# Patient Record
Sex: Female | Born: 1935
Health system: Southern US, Community
[De-identification: ages and names within clinical notes are randomized; demographics above are authoritative.]

## PROBLEM LIST (undated history)

## (undated) DIAGNOSIS — F32A Depression, unspecified: Secondary | ICD-10-CM

## (undated) DIAGNOSIS — Z6834 Body mass index (BMI) 34.0-34.9, adult: Secondary | ICD-10-CM

## (undated) DIAGNOSIS — R0902 Hypoxemia: Secondary | ICD-10-CM

## (undated) DIAGNOSIS — Z9889 Other specified postprocedural states: Secondary | ICD-10-CM

## (undated) DIAGNOSIS — R296 Repeated falls: Secondary | ICD-10-CM

## (undated) DIAGNOSIS — F329 Major depressive disorder, single episode, unspecified: Secondary | ICD-10-CM

## (undated) DIAGNOSIS — J9602 Acute respiratory failure with hypercapnia: Secondary | ICD-10-CM

## (undated) DIAGNOSIS — G479 Sleep disorder, unspecified: Secondary | ICD-10-CM

## (undated) DIAGNOSIS — J9610 Chronic respiratory failure, unspecified whether with hypoxia or hypercapnia: Secondary | ICD-10-CM

## (undated) DIAGNOSIS — R112 Nausea with vomiting, unspecified: Secondary | ICD-10-CM

## (undated) DIAGNOSIS — G4733 Obstructive sleep apnea (adult) (pediatric): Secondary | ICD-10-CM

## (undated) DIAGNOSIS — M1990 Unspecified osteoarthritis, unspecified site: Secondary | ICD-10-CM

## (undated) DIAGNOSIS — R000 Tachycardia, unspecified: Secondary | ICD-10-CM

## (undated) DIAGNOSIS — D494 Neoplasm of unspecified behavior of bladder: Secondary | ICD-10-CM

## (undated) DIAGNOSIS — J3089 Other allergic rhinitis: Secondary | ICD-10-CM

## (undated) DIAGNOSIS — K219 Gastro-esophageal reflux disease without esophagitis: Secondary | ICD-10-CM

## (undated) DIAGNOSIS — R053 Chronic cough: Secondary | ICD-10-CM

## (undated) DIAGNOSIS — I2510 Atherosclerotic heart disease of native coronary artery without angina pectoris: Secondary | ICD-10-CM

## (undated) DIAGNOSIS — M797 Fibromyalgia: Secondary | ICD-10-CM

## (undated) DIAGNOSIS — J189 Pneumonia, unspecified organism: Secondary | ICD-10-CM

## (undated) DIAGNOSIS — R0602 Shortness of breath: Secondary | ICD-10-CM

## (undated) DIAGNOSIS — M4800 Spinal stenosis, site unspecified: Secondary | ICD-10-CM

## (undated) DIAGNOSIS — J9621 Acute and chronic respiratory failure with hypoxia: Secondary | ICD-10-CM

## (undated) DIAGNOSIS — D509 Iron deficiency anemia, unspecified: Secondary | ICD-10-CM

## (undated) DIAGNOSIS — Z9981 Dependence on supplemental oxygen: Secondary | ICD-10-CM

## (undated) DIAGNOSIS — R058 Other specified cough: Secondary | ICD-10-CM

## (undated) DIAGNOSIS — E785 Hyperlipidemia, unspecified: Secondary | ICD-10-CM

## (undated) DIAGNOSIS — R7989 Other specified abnormal findings of blood chemistry: Secondary | ICD-10-CM

## (undated) DIAGNOSIS — R319 Hematuria, unspecified: Secondary | ICD-10-CM

## (undated) DIAGNOSIS — R102 Pelvic and perineal pain: Secondary | ICD-10-CM

## (undated) DIAGNOSIS — R05 Cough: Secondary | ICD-10-CM

## (undated) DIAGNOSIS — H9191 Unspecified hearing loss, right ear: Secondary | ICD-10-CM

## (undated) DIAGNOSIS — D72819 Decreased white blood cell count, unspecified: Secondary | ICD-10-CM

## (undated) DIAGNOSIS — R42 Dizziness and giddiness: Secondary | ICD-10-CM

## (undated) DIAGNOSIS — M8580 Other specified disorders of bone density and structure, unspecified site: Secondary | ICD-10-CM

## (undated) DIAGNOSIS — Z87448 Personal history of other diseases of urinary system: Secondary | ICD-10-CM

## (undated) DIAGNOSIS — J449 Chronic obstructive pulmonary disease, unspecified: Secondary | ICD-10-CM

## (undated) DIAGNOSIS — R7303 Prediabetes: Secondary | ICD-10-CM

## (undated) DIAGNOSIS — R32 Unspecified urinary incontinence: Secondary | ICD-10-CM

## (undated) DIAGNOSIS — G609 Hereditary and idiopathic neuropathy, unspecified: Secondary | ICD-10-CM

## (undated) DIAGNOSIS — M2550 Pain in unspecified joint: Secondary | ICD-10-CM

## (undated) HISTORY — DX: Decreased white blood cell count, unspecified: D72.819

## (undated) HISTORY — DX: Other allergic rhinitis: J30.89

## (undated) HISTORY — DX: Unspecified osteoarthritis, unspecified site: M19.90

## (undated) HISTORY — DX: Tachycardia, unspecified: R00.0

## (undated) HISTORY — DX: Depression, unspecified: F32.A

## (undated) HISTORY — DX: Fibromyalgia: M79.7

## (undated) HISTORY — PX: CATARACT EXTRACTION W/ INTRAOCULAR LENS  IMPLANT, BILATERAL: SHX1307

## (undated) HISTORY — PX: ESOPHAGOGASTRODUODENOSCOPY: SHX1529

## (undated) HISTORY — DX: Body mass index (BMI) 34.0-34.9, adult: Z68.34

## (undated) HISTORY — DX: Hyperlipidemia, unspecified: E78.5

## (undated) HISTORY — DX: Prediabetes: R73.03

## (undated) HISTORY — DX: Spinal stenosis, site unspecified: M48.00

## (undated) HISTORY — DX: Acute and chronic respiratory failure with hypoxia: J96.21

## (undated) HISTORY — DX: Iron deficiency anemia, unspecified: D50.9

## (undated) HISTORY — DX: Other specified disorders of bone density and structure, unspecified site: M85.80

## (undated) HISTORY — DX: Pain in unspecified joint: M25.50

## (undated) HISTORY — DX: Other specified abnormal findings of blood chemistry: R79.89

## (undated) HISTORY — PX: TONSILLECTOMY: SUR1361

## (undated) HISTORY — PX: CARDIAC CATHETERIZATION: SHX172

## (undated) HISTORY — DX: Pneumonia, unspecified organism: J18.9

## (undated) HISTORY — DX: Repeated falls: R29.6

## (undated) HISTORY — DX: Hypoxemia: R09.02

## (undated) HISTORY — DX: Unspecified hearing loss, right ear: H91.91

## (undated) HISTORY — DX: Dizziness and giddiness: R42

## (undated) HISTORY — DX: Hypomagnesemia: E83.42

## (undated) HISTORY — DX: Shortness of breath: R06.02

## (undated) HISTORY — DX: Acute respiratory failure with hypercapnia: J96.02

## (undated) HISTORY — DX: Sleep disorder, unspecified: G47.9

---
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
## 1969-04-23 HISTORY — PX: APPENDECTOMY: SHX54

## 1971-04-24 HISTORY — PX: HEMORRHOID SURGERY: SHX153

## 1971-04-24 HISTORY — PX: TOTAL ABDOMINAL HYSTERECTOMY W/ BILATERAL SALPINGOOPHORECTOMY: SHX83

## 1986-04-23 HISTORY — PX: CHOLECYSTECTOMY: SHX55

## 2000-02-12 ENCOUNTER — Other Ambulatory Visit: Admission: RE | Admit: 2000-02-12 | Discharge: 2000-02-12 | Payer: Self-pay | Admitting: *Deleted

## 2000-03-28 ENCOUNTER — Encounter (INDEPENDENT_AMBULATORY_CARE_PROVIDER_SITE_OTHER): Payer: Self-pay | Admitting: Specialist

## 2000-03-28 ENCOUNTER — Ambulatory Visit (HOSPITAL_COMMUNITY): Admission: RE | Admit: 2000-03-28 | Discharge: 2000-03-28 | Payer: Self-pay | Admitting: *Deleted

## 2000-06-26 ENCOUNTER — Ambulatory Visit (HOSPITAL_COMMUNITY): Admission: RE | Admit: 2000-06-26 | Discharge: 2000-06-26 | Payer: Self-pay | Admitting: Orthopedic Surgery

## 2000-06-26 ENCOUNTER — Encounter: Payer: Self-pay | Admitting: Orthopedic Surgery

## 2000-07-31 ENCOUNTER — Encounter: Payer: Self-pay | Admitting: Orthopedic Surgery

## 2000-07-31 ENCOUNTER — Encounter: Admission: RE | Admit: 2000-07-31 | Discharge: 2000-07-31 | Payer: Self-pay | Admitting: Orthopedic Surgery

## 2000-08-14 ENCOUNTER — Encounter: Admission: RE | Admit: 2000-08-14 | Discharge: 2000-08-14 | Payer: Self-pay | Admitting: Orthopedic Surgery

## 2000-08-14 ENCOUNTER — Encounter: Payer: Self-pay | Admitting: Orthopedic Surgery

## 2000-08-26 ENCOUNTER — Encounter: Admission: RE | Admit: 2000-08-26 | Discharge: 2000-08-26 | Payer: Self-pay | Admitting: Orthopedic Surgery

## 2000-08-26 ENCOUNTER — Encounter: Payer: Self-pay | Admitting: Orthopedic Surgery

## 2001-05-05 ENCOUNTER — Ambulatory Visit (HOSPITAL_COMMUNITY): Admission: RE | Admit: 2001-05-05 | Discharge: 2001-05-05 | Payer: Self-pay | Admitting: *Deleted

## 2002-04-30 ENCOUNTER — Encounter: Payer: Self-pay | Admitting: Orthopedic Surgery

## 2002-04-30 ENCOUNTER — Encounter: Admission: RE | Admit: 2002-04-30 | Discharge: 2002-04-30 | Payer: Self-pay | Admitting: Orthopedic Surgery

## 2002-05-20 ENCOUNTER — Encounter: Admission: RE | Admit: 2002-05-20 | Discharge: 2002-05-20 | Payer: Self-pay | Admitting: Orthopedic Surgery

## 2002-05-20 ENCOUNTER — Encounter: Payer: Self-pay | Admitting: Orthopedic Surgery

## 2002-06-10 ENCOUNTER — Encounter: Payer: Self-pay | Admitting: Orthopedic Surgery

## 2002-06-10 ENCOUNTER — Encounter: Admission: RE | Admit: 2002-06-10 | Discharge: 2002-06-10 | Payer: Self-pay | Admitting: Orthopedic Surgery

## 2003-05-07 ENCOUNTER — Ambulatory Visit (HOSPITAL_COMMUNITY): Admission: RE | Admit: 2003-05-07 | Discharge: 2003-05-07 | Payer: Self-pay | Admitting: *Deleted

## 2003-05-07 ENCOUNTER — Encounter (INDEPENDENT_AMBULATORY_CARE_PROVIDER_SITE_OTHER): Payer: Self-pay | Admitting: *Deleted

## 2005-03-12 ENCOUNTER — Encounter: Admission: RE | Admit: 2005-03-12 | Discharge: 2005-03-12 | Payer: Self-pay | Admitting: Family Medicine

## 2006-04-27 ENCOUNTER — Encounter: Payer: Self-pay | Admitting: Internal Medicine

## 2006-04-27 ENCOUNTER — Inpatient Hospital Stay (HOSPITAL_COMMUNITY): Admission: EM | Admit: 2006-04-27 | Discharge: 2006-05-01 | Payer: Self-pay | Admitting: Emergency Medicine

## 2006-05-13 ENCOUNTER — Encounter: Admission: RE | Admit: 2006-05-13 | Discharge: 2006-05-13 | Payer: Self-pay | Admitting: Family Medicine

## 2006-05-20 ENCOUNTER — Ambulatory Visit (HOSPITAL_COMMUNITY): Admission: RE | Admit: 2006-05-20 | Discharge: 2006-05-20 | Payer: Self-pay | Admitting: Family Medicine

## 2007-03-13 ENCOUNTER — Ambulatory Visit (HOSPITAL_COMMUNITY): Admission: RE | Admit: 2007-03-13 | Discharge: 2007-03-13 | Payer: Self-pay | Admitting: Family Medicine

## 2007-03-28 ENCOUNTER — Inpatient Hospital Stay (HOSPITAL_COMMUNITY): Admission: EM | Admit: 2007-03-28 | Discharge: 2007-04-01 | Payer: Self-pay | Admitting: Emergency Medicine

## 2007-04-28 ENCOUNTER — Encounter: Payer: Self-pay | Admitting: Internal Medicine

## 2007-05-08 ENCOUNTER — Inpatient Hospital Stay (HOSPITAL_BASED_OUTPATIENT_CLINIC_OR_DEPARTMENT_OTHER): Admission: RE | Admit: 2007-05-08 | Discharge: 2007-05-08 | Payer: Self-pay | Admitting: Cardiology

## 2007-05-15 ENCOUNTER — Ambulatory Visit: Payer: Self-pay | Admitting: Internal Medicine

## 2007-05-15 ENCOUNTER — Encounter: Admission: RE | Admit: 2007-05-15 | Discharge: 2007-05-15 | Payer: Self-pay | Admitting: Family Medicine

## 2007-05-15 DIAGNOSIS — K219 Gastro-esophageal reflux disease without esophagitis: Secondary | ICD-10-CM | POA: Insufficient documentation

## 2007-05-15 DIAGNOSIS — E785 Hyperlipidemia, unspecified: Secondary | ICD-10-CM

## 2007-05-15 HISTORY — DX: Gastro-esophageal reflux disease without esophagitis: K21.9

## 2007-05-15 HISTORY — DX: Hyperlipidemia, unspecified: E78.5

## 2007-06-19 ENCOUNTER — Ambulatory Visit: Payer: Self-pay | Admitting: Internal Medicine

## 2007-06-19 DIAGNOSIS — J449 Chronic obstructive pulmonary disease, unspecified: Secondary | ICD-10-CM | POA: Insufficient documentation

## 2007-06-19 HISTORY — DX: Chronic obstructive pulmonary disease, unspecified: J44.9

## 2007-10-06 ENCOUNTER — Ambulatory Visit (HOSPITAL_COMMUNITY): Admission: RE | Admit: 2007-10-06 | Discharge: 2007-10-06 | Payer: Self-pay | Admitting: Neurosurgery

## 2007-10-07 ENCOUNTER — Ambulatory Visit: Payer: Self-pay | Admitting: Critical Care Medicine

## 2007-10-07 ENCOUNTER — Inpatient Hospital Stay (HOSPITAL_COMMUNITY): Admission: RE | Admit: 2007-10-07 | Discharge: 2007-10-13 | Payer: Self-pay | Admitting: Neurosurgery

## 2007-10-07 HISTORY — PX: POSTERIOR LUMBAR FUSION: SHX6036

## 2007-10-10 ENCOUNTER — Ambulatory Visit: Payer: Self-pay | Admitting: Physical Medicine & Rehabilitation

## 2007-10-13 ENCOUNTER — Ambulatory Visit: Payer: Self-pay | Admitting: Emergency Medicine

## 2007-10-13 ENCOUNTER — Ambulatory Visit: Payer: Self-pay | Admitting: Physical Medicine & Rehabilitation

## 2007-10-13 ENCOUNTER — Inpatient Hospital Stay (HOSPITAL_COMMUNITY)
Admission: RE | Admit: 2007-10-13 | Discharge: 2007-10-20 | Payer: Self-pay | Admitting: Physical Medicine & Rehabilitation

## 2008-02-25 ENCOUNTER — Encounter: Admission: RE | Admit: 2008-02-25 | Discharge: 2008-02-25 | Payer: Self-pay | Admitting: Family Medicine

## 2008-05-24 ENCOUNTER — Encounter: Admission: RE | Admit: 2008-05-24 | Discharge: 2008-05-24 | Payer: Self-pay | Admitting: Family Medicine

## 2008-08-05 ENCOUNTER — Encounter: Admission: RE | Admit: 2008-08-05 | Discharge: 2008-08-05 | Payer: Self-pay | Admitting: Gastroenterology

## 2008-09-09 ENCOUNTER — Ambulatory Visit (HOSPITAL_COMMUNITY): Admission: RE | Admit: 2008-09-09 | Discharge: 2008-09-09 | Payer: Self-pay | Admitting: Gastroenterology

## 2009-05-26 ENCOUNTER — Encounter: Admission: RE | Admit: 2009-05-26 | Discharge: 2009-05-26 | Payer: Self-pay | Admitting: Family Medicine

## 2010-05-15 ENCOUNTER — Encounter: Payer: Self-pay | Admitting: Neurosurgery

## 2010-06-12 ENCOUNTER — Other Ambulatory Visit: Payer: Self-pay | Admitting: Family Medicine

## 2010-06-12 DIAGNOSIS — Z1231 Encounter for screening mammogram for malignant neoplasm of breast: Secondary | ICD-10-CM

## 2010-06-20 ENCOUNTER — Ambulatory Visit
Admission: RE | Admit: 2010-06-20 | Discharge: 2010-06-20 | Disposition: A | Discharge status: Home / Self Care | Payer: Medicare Other | Source: Ambulatory Visit | Attending: Family Medicine | Admitting: Family Medicine

## 2010-06-20 DIAGNOSIS — Z1231 Encounter for screening mammogram for malignant neoplasm of breast: Secondary | ICD-10-CM

## 2010-09-05 NOTE — Cardiovascular Report (Signed)
[name], [name]              ACCOUNT NO.:  [id number]   MEDICAL RECORD NO.:  [id number]          PATIENT TYPE:  OIB   LOCATION:  1961                         FACILITY:  [facility]   PHYSICIAN:  Jake Bathe, MD      DATE OF BIRTH:  1935-09-01   DATE OF PROCEDURE:  DATE OF DISCHARGE:  05/08/2007                            CARDIAC CATHETERIZATION   PRIMARY CARE PHYSICIAN:  Juluis Rainier, M.D.   INDICATIONS:  A 75 year old female with hyperlipidemia, tobacco abuse,  COPD with chest pain concerning for possible anginal and abnormal  nuclear stress test concerning for ischemia in the anteroseptal/possible  inferior region.  Echocardiogram revealed normal ejection fraction of  70% to 75% with diastolic dysfunction and mild aortic sclerosis.   PROCEDURE:  1. Left heart catheterization.  2. Selective coronary angiography.  3. Left ventriculogram.   PROCEDURE IN DETAIL:  Informed consent was obtained.  Risks of stroke,  heart attack, and death were explained to the patient.  She was placed  on the catheterization table and prepped in a sterile fashion.  A 1%  lidocaine was used for local anesthesia in the right groin.  Fluoroscopy  was used to map the femoral region.  Using the modified Seldinger  technique, a 4-French sheath was placed in the right femoral artery.  A  Judkins left #4 catheter was used to selectively cannulate the left main  artery.  Multiple views with hand injection of Omnipaque were obtained.  This catheter was exchanged for a No-Torque Williams right catheter,  which was used to selectively cannulate the right coronary artery.  Multiple views with hand injection were obtained.  This catheter was  exchanged for an angled pigtail, which was used to cross the aortic  valve.  Hemodynamics within the left ventricle were obtained.  Left  ventriculogram using a power injection at a rate of 15 mL per second for  a total of 30 mL was used.  Following this, a aortic valve  pullback  gradient was obtained.  Following the procedure, manual compression was  held with sheath pull.  The patient tolerated the procedure well.  Hemodynamically stable.  No hematomas.  Findings were discussed with the  patient and the family.   FINDINGS:  1. Left main artery - No angiographically significant coronary artery      disease.  2. Left anterior descending artery - This branches from left main.      She has 1 large diagonal branch and 1 small distal diagonal branch.      There is 20% mid LAD stenosis distal to the first diagonal branch.      Otherwise, no significant coronary artery disease.  3. Left circumflex artery - This is a large caliber vessel with 1      large obtuse marginal branch.  There is no angiographically      significant coronary artery disease.  4. Right coronary artery - This is the dominant vessel.  There is 10%      mid RCA stenosis with otherwise no angiographically significant      coronary artery disease.  5. Left  ventriculogram - Ejection fraction estimated at 60% to 65%      with no regional wall motion abnormalities.  There is no      significant mitral regurgitation.  There is mild amount of aortic      arch calcification.  6. Hemodynamics - Left ventricle 108/10 mmHg with 10 mmHg of being the      LVEDP or left ventricular end-diastolic pressure.  Aortic pressures      were 108/61 with a mean of 83 mmHg.  There was no gradient between      the left ventricle and the aortic root.   IMPRESSIONS:  1. Minor coronary plaquing with no angiographically significant      coronary artery disease.  2. Normal left ventricular ejection fraction estimated at 60% to 65%      with no regional wall motion abnormalities.   RECOMMENDATIONS:  Continue smoking cessation.  Continue Lipitor for  hyperlipidemia.  Continue aspirin for prevention.  Spiriva for COPD.  Possible etiologies for chest discomfort include esophageal spasm and  GERD.  She has previous  stricture noted.  Further noncardiac workup may  be necessary.      Jake Bathe, MD  Electronically Signed     MCS/MEDQ  D:  05/08/2007  T:  05/08/2007  Job:  [id number]   cc:   Juluis Rainier, M.D.

## 2010-09-05 NOTE — H&P (Signed)
[name], [name]              ACCOUNT NO.:  [id number]   MEDICAL RECORD NO.:  [id number]          PATIENT TYPE:  IPS   LOCATION:  4036                         FACILITY:  [facility]   PHYSICIAN:  Ranelle Oyster, M.D.DATE OF BIRTH:  [birth year]   DATE OF ADMISSION:  10/13/2007  DATE OF DISCHARGE:                              HISTORY & PHYSICAL   CHIEF COMPLAINT:  Back pain and leg pain with weakness.   HISTORY OF PRESENT ILLNESS:  This is a 75 year old white female with  history of lumbar stenosis and neurogenic claudication due to L4-L5  spondylolisthesis.  The patient also has chronic radiculopathy.  She  elected to undergo bilateral L4 laminectomy with L4-L5 diskectomy and  fusion on June 16 by Dr. Jeral Fruit.  The patient had postoperative pain  issues as well as lethargy secondary to narcotics.  Also notable hypoxia  followed by pulmonary medicine.  Medications were adjusted.  She was  provided with nebulizer treatments and oxygen as well as flutter valve  for hypoventilation and she has gradually improved.  The patient is a  min assist for basic mobility and transfers.  She is still having  problems stating her back precautions, although her cognition is  generally clearing.   REVIEW OF SYSTEMS:  Notable for insomnia, low-back pain, wound reflux.  Breathing is at baseline.   PAST MEDICAL HISTORY:  1. Positive COPD, on O2 at bedtime at home.  2. Allergic rhinitis.  3. Increased cholesterol.  4. Cholecystectomy.  5. Hysterectomy.  6. Hemorrhoidectomy.  7. DDD.   FAMILY HISTORY:  Positive for cancer.   SOCIAL HISTORY:  The patient lives alone at [facility] in a one-level  house.  Daughter lives out of town, but can check in on the patient and  likely will be staying with her a few days upon discharge.  The patient  occasionally drinks and has not smoked for 4 years.   FUNCTIONAL STATUS:  The patient is limited with mobility secondary to  pain but was independent with her  walker prior to arrival.  Currently,  she is min assist for basic mobility, min-to-mod assist for self-care.   ALLERGIES:  None.   MEDICATIONS:  1. Lipitor 10 mg p.o. daily.  2. Diazepam 5 mg q. 6-8 h.  3. Percocet 5/325 p.r.n.  4. Zyrtec 10 mg daily.  5. Prilosec 20 mg b.i.d.  6. Spiriva q.a.m.  7. Symbicort 2 puffs b.i.d.  8. Xopenex nebulizer daily p.r.n.  9. Astelin b.i.d.   LABORATORY DATA:  Hemoglobin 13.8, white count 10.6, platelets 381,000.  Sodium 138, potassium 4.3, BUN and creatinine 9 and 0.63.   PHYSICAL EXAMINATION:  VITAL SIGNS:  Blood pressure 102/56, pulse 83,  respiratory rate 16, temperature 98.5.  The patient is saturating 98% on  2 L and at midnight is off oxygen.  GENERAL:  The patient is pleasant, alert, and oriented x3.  HEENT:  Pupils equal, round, and reactive to light.  Nose and throat  exam is notable for fair-to-good dentition.  Pink and moist mucosa.  NECK:  Supple without JVD or lymphadenopathy.  CHEST:  Some  decreased sounds at bases, but otherwise was clear.  HEART:  Regular rate and rhythm without murmur, rubs, or gallops.  EXTREMITIES:  No clubbing, cyanosis or edema.  ABDOMEN:  Soft, nontender, slightly distended in appearance.  She had  active bowel sounds.  SKIN:  Notable for a back incision which was clean and generally intact.  NEUROLOGIC:  Cranial nerve exam II-XII was within normal limits.  Reflexes are generally 2+ in the upper extremities, 2+ to 1+ lower  extremities.  Sensation appeared slightly decreased in right leg to  pinprick and light touch.  Strength was near 4+ to 5/5 with some pain  inhibition proximally in the upper extremities.  Bilateral lower  extremity strength was 3/5 proximal and 4 to 4+/5 distally.  The patient  did have some ankle dorsiflexion weakness on the right which I will  grade 3+ to 4/5.  Judgment, orientation, memory, and mood were all fair.  She did liked to choke her way through some answers but the  most part  was appropriate today.   ASSESSMENT/PLAN:  1. Functional deficit secondary to L4-L5 spondylolisthesis with      neurogenic claudication and radiculopathy status post L4      laminectomy with L4-L5 fusion on October 07, 2007.  The patient is      admitted to the inpatient rehab unit today to receive comprehensive      medical and physical care provided in a collaborative effort      between physician, nursing, and therapy team.  Physician will      provide 24-hour rehab unit coverage with medical reasons and      supervision of therapy.  A 24-hour rehab nurse will follow the      patient for constipation issues as well as bladder continence, pain      management, and skin needs as well as incorporating therapy goals      into their care.  Physical therapy will assess and treat the      patient for range of motion, strengthening, safety, and appropriate      equipment needs as well as family education.  Occupational therapy      will assess and treat for cognitive/perceptual training for      appropriate safety, equipment, and ADL needs.  Rehab case      manager/social worker will assess for psychosocial needs and      discharge planning.  The patient will see on average at least 3      hours a day 5 days a week.  Goals are modified independent.      Estimated length of stay is 7 days or less.  Prognosis is good.  2. Chronic obstructive pulmonary disease:  Continue management with      tapering off meds per pulmonary medicine.  The patient's daytime      oxygen was discontinued today.  Continue nebulizers, flutter valve,      etc. for now.  3. Pain management:  Try to limit sedating medications as possible.      Use p.r.n. Toradol in a short-term with Vicodin for breakthrough      pain.  Add a K-pad and lidocaine patches and observe for response.  4. Constipation:  Subset enema today if no bowel movement for 6 days.      The patient's appetite has been poor, however, and we  encouraged      appropriate intake.  5. Deep venous thrombosis prophylaxis with ambulation at this point.  Ranelle Oyster, M.D.  Electronically Signed     ZTS/MEDQ  D:  10/13/2007  T:  10/14/2007  Job:  [id number]

## 2010-09-05 NOTE — H&P (Signed)
[name], [name]              ACCOUNT NO.:  [id number]   MEDICAL RECORD NO.:  [id number]          PATIENT TYPE:  INP   LOCATION:  3005                         FACILITY:  [facility]   PHYSICIAN:  Hilda Lias, M.D.   DATE OF BIRTH:  July 31, 1935   DATE OF ADMISSION:  10/07/2007  DATE OF DISCHARGE:                              HISTORY & PHYSICAL   [name] is a lady who has been seen by me in my office for at least 2  years complaining of back pain radiating to both legs which had been  going on for more than 10 years associated with weakness.  The patient  in 2007 had the diagnosis of lumbar stenosis with spondylolisthesis.  The patient declined surgery.  About 18 months ago, she got an MRI under  general anesthesia which showed that she had the same problem.  Nevertheless, she came with her daughter several days ago to my office  complaining of worsening pain.  We attempted to do myelogram yesterday  because we were unable to get another MRI with general anesthesia until  August.  The myelogram showed that she had possible spondylolisthesis at  the L4-L5.  Because of that, yesterday she decided she will have surgery  because of increasing pain and weakness.   PAST MEDICAL HISTORY:  Cholecystectomy, hysterectomy, and  hemorrhoidectomy.   SOCIAL HISTORY:  She drinks socially.   FAMILY HISTORY:  Positive for high blood pressure and diabetes.   REVIEW OF SYSTEMS:  Positive for UTI, emphysema, cholesterol, increased  chest pain, bilateral  __________.   PHYSICAL EXAMINATION:  GENERAL:  The patient came in to my office with  her daughter.  She was barely walking with a short step.  She was  walking with a walker.  HEENT:  Head, ears, nose, and throat are normal.  NECK:  Normal.  LUNGS:  Clear.  HEART:  Heart sounds normal.  ABDOMEN:  Normal.  EXTREMITIES:  Normal pulse, feet.  NEURO:  Weakness noted especially in both feet 3/5 with straight leg  raising, SLR positive at 45  degrees.  She has a decrease of reflex.  Sensation:  She complains of numbness on both feet.  The myelogram  showed that she has some degenerative disk disease, but at the level of  L4-L5, she has a severe stenosis with spondylolisthesis.   CLINICAL IMPRESSION:  Lumbar stenosis with an L4-L5 spondylolisthesis.   RECOMMENDATIONS:  The patient wants to proceed with surgery.  The  procedure will be a L4-L5 diskectomy fusion with pedicular screws and  autograft.  The patient knew all the risks such as infection, worsening  pain, paralysis, and need of further surgery, and no  feeling  whatsoever.           ______________________________  Hilda Lias, M.D.     EB/MEDQ  D:  10/07/2007  T:  10/08/2007  Job:  [id number]

## 2010-09-05 NOTE — Discharge Summary (Signed)
[name], [name]              ACCOUNT NO.:  [id number]   MEDICAL RECORD NO.:  [id number]          [name] TYPE:  IPS   LOCATION:  4036                         FACILITY:  [facility]   PHYSICIAN:  Ranelle Oyster, M.D.DATE OF BIRTH:  1935-08-02   DATE OF ADMISSION:  10/13/2007  DATE OF DISCHARGE:  10/20/2007                               DISCHARGE SUMMARY   DISCHARGE DIAGNOSES:  1. L4-L5 spondylolisthesis with neurogenic claudication and      radiculopathy.  2. Chronic obstructive pulmonary disease with dyspnea on exertion,      hypoxia resolved.  3. Constipation related to narcotics.   HISTORY OF PRESENT ILLNESS:  [name] is a 75 year old female with  history of lumbar stenosis with neurogenic claudication L4-5 with  spondylolisthesis and chronic radiculopathy.  [name] elected to  undergo bilateral L4 lam with at 45 diskectomy and fusion 10/07/2007 by  Dr. Jeral Fruit.  Postop has had issues with pain control as well as lethargy  secondary to narcotics.  Dr. Delford Field was consulted for input secondary to  issues with hypoxia.  Chest x-ray was done showed low lung volumes.  Pulmonary recommended nebulizer treatments with flutter valve for  hyperventilation and hypoxic hypoxemia due to oversedation.  Currently,  [name] in supervision for transfers min-mod, min-guard assist for  ambulating 120 feet with continuous issues with pain management and  inability to state any back precautions.  Rehab consulted for further  therapies.   PAST MEDICAL HISTORY:  Significant for COPD with O2 dependence q.h.s.,  allergic rhinitis, dyslipidemia, cholecystectomy, hysterectomy,  hemorrhoidectomy, and DDD.   ALLERGIES:  No known drug allergies.   FAMILY HISTORY:  Positive for cancer.   SOCIAL HISTORY:  [name] in [place] in one-level home  with four steps at entry.  Uses alcohol occasionally.  Smokes 4-5  cigarettes a daily after having quit tobacco 4 years ago.   HOSPITAL COURSE:  [name] was admitted to rehab on October 13, 2007, for inpatient therapies to consist of PT, OT daily.  In Sherri past  admission, Sherri pain was initially managed with p.r.n. Vicodin.  K-pad  was added q.h.s. as well as lidocaine patches during Sherri daytime to help  with pain management.  [name] continue to have issues with  neuropathy and low-dose Neurontin 100 mg b.i.d. was added to help with  her symptomatology.  [name] has had no evidence of lethargy on  this.  She has had issues with constipation requiring multiple laxatives  including soapsuds enema and Fleet enema during this stay.  Bowel  program was adjusted and [name] is advised to use Senokot-S 2 p.o.  b.i.d. with milk of magnesia every other day if no BM.  [name]'s  blood pressures have been monitored on b.i.d. basis.  These have ranged  from 90s to 130s systolic and 60s diastolic.  [name] was initially  on O2 continuously secondary to hypoxemia.  As mobility has improved and  [name] with improvement in using his incentive spirometer Sherri  [name] has been weaned off nebulizers.  She requires  O2 with activity  and q.h.s. and is to continue on this past discharge.  [name]'s  back incision has been healing well without any signs or symptoms of  infection.  It is intact.  Overall, [name]'s safety of awareness  has greatly improved.  She is able to state 3/3 back precautions and  shows good adherence to them.  She is also showing good adherence to use  of her back brace.   During her stay in rehab [name] has progressed along to being at  modified independent level for ADLs.  OT has focused on using assistive  equipment to maintain her back precautions as well as engaging in  activity to help with endurance and tolerance.  PT has been working with  [name] for mobility and currently [name] is modified  independent for ambulating 300 feet.  She requires  supervision for  navigating obstacles, able to navigate 4 stairs with min assist.  Has  been able to maintain her back precautions with PT activities.  Sherri  [name] will have further followup home health PT, OT by [facility] and has also been arranged to monitor O2 sats.  On October 20, 2007, [name] is discharged to home.   DISCHARGE MEDICATIONS:  1. Lipitor 10 mg p.o. q.h.s.  2. Zyrtec 10 mg q.h.s.  3. Spiriva 1 inhalation in a.m.  4. Symbicort 160/45 two puffs b.i.d.  5. Xopenex 15 g one inhalation p.r.n. daily.  6. Senokot-S two p.o. b.i.d.  7. Norco 5-325 one to two q.6-8 h. p.r.n. pain #60 RX.  8. Prilosec 20 mg b.i.d.  9. Astelin nasal spray one squirt each nostril b.i.d.  10.Lidocaine patches one each to left and right lower back place on at      8 a.m. and off 8 p.m. daily.  11.Neurontin 100 mg b.i.d.  12.Milk of magnesia 30 mL to 60 mL every other day if no BM.   DISCHARGE INSTRUCTIONS:  Diet is regular.  Wound care, keep erectly,  then dry, wear a brace when out of bed.  Activities, 24-hour  supervision.  No strenuous activity.  No alcohol.  No smoking.  No  driving.  Walk with walker, maintain back precautions.  Wear oxygen  whenever up and about and at bedtime.  Do not use Percocet or diazepam.  [facility] to provide PT, OT, RN.   FOLLOWUP:  [name] to follow up with Dr. Jeral Fruit for postop check  11/06/2007, follow up with Dr. Uvaldo Rising, Dr. Elesa Massed for routine check in 2-3  weeks.  Follow up with Dr. Riley Kill as needed.      Greg Cutter, P.A.      Ranelle Oyster, M.D.  Electronically Signed    PP/MEDQ  D:  10/20/2007  T:  10/21/2007  Job:  [id number]   cc:   Susie L. Lawerance Bach, R.N.  Hilda Lias, M.D.

## 2010-09-05 NOTE — H&P (Signed)
NAMEARABEL, [name]              ACCOUNT NO.:  [id number]   MEDICAL RECORD NO.:  [id number]          PATIENT TYPE:  INP   LOCATION:  1406                         FACILITY:  [facility]   PHYSICIAN:  Kela Millin, M.D.DATE OF BIRTH:  1935/08/28   DATE OF ADMISSION:  03/28/2007  DATE OF DISCHARGE:                              HISTORY & PHYSICAL   CONTINUATION  This is a continuation of an admission history and physician.   Her symptoms are persistent, and so she was sent to the ER for further  evaluation and management.  In the ER, after receiving further nebulized  bronchodilators, she continued to feel bad, and so is admitted for  further evaluation and management.   PAST MEDICAL HISTORY:  1. As per HPI.  2. Lumbar degenerative disk disease with history of infection.   MEDICATIONS:  1. Qvar 80 mcg b.i.d.  2. Mucinex DM.  3. Spiriva one inhalation daily.  4. Astelin.  5. Xopenex.  6. Lipitor 10 mg daily.  7. Zyrtec.   ALLERGIES:  NKDA.   SOCIAL HISTORY:  She quit tobacco, she denies alcohol.   FAMILY HISTORY:  Her father had lung cancer, and he was a non-smoker.  Her mother is deceased, had probable ovarian cancer, a sister is  deceased, had a pelvic cancer.   REVIEW OF SYSTEMS:  As per history of present illness.  Other review of  systems negative.   PHYSICAL EXAMINATION:  GENERAL:  The patient is an elderly white female  in no respiratory distress.  VITAL SIGNS:  Temperature is 98.7 with a blood pressure of 120/68, pulse  88, O2 sat 99%.  HEENT:  PERRL, EOMI, sclerae anicteric, moist mucous membranes and no  oral exudates.  LUNGS:  Diminished air movement, no wheezes.  CARDIOVASCULAR:  Regular rate and rhythm, normal S1, S2.  ABDOMEN:  Soft, bowel sounds present, nontender, nondistended, no  organomegaly and no masses palpable.  EXTREMITIES:  No cyanosis and no edema.   LABORATORY DATA:  The white cell count is 16.6, hemoglobin of 14,  hematocrit of 41.5.  The  platelet count is 373, sodium is 141 with  potassium of 5.5, chloride is 99, BUN 11, creatinine 0.60, glucose is 98  with a calcium of 9.5, and this was done at the office.  The urinalysis  done in the ER reveals a cloudy appearance.  Urine nitrites are  positive, moderate leukocyte esterase with 7 to 10 WBCs and many  bacteria.   ASSESSMENT/PLAN:  1. Chronic obstructive pulmonary disease exacerbation - discussed      above, will place on antibiotics, steroids, Mucinex and nebulized      bronchodilators.  Will continue supplemental oxygen, also continue      Astelin and Zyrtec.  2. Urinary tract infection - will obtain urine cultures, place on      empiric antibiotics.  3. Allergic rhinitis - continue outpatient medications as above.  4. Gastroesophageal reflux disease - continue proton pump inhibitors.  5. History of hypercholesterolemia - continue Lipitor.      Kela Millin, M.D.  Electronically Signed  ACV/MEDQ  D:  03/30/2007  T:  03/30/2007  Job:  [id number]   cc:   Dr. Zachery Dauer

## 2010-09-05 NOTE — Discharge Summary (Signed)
[name], [name]              ACCOUNT NO.:  [id number]   MEDICAL RECORD NO.:  [id number]          PATIENT TYPE:  INP   LOCATION:  1406                         FACILITY:  [facility]   PHYSICIAN:  [name], M.D.DATE OF BIRTH:  05-04-1935   DATE OF ADMISSION:  03/28/2007  DATE OF DISCHARGE:  04/01/2007                               DISCHARGE SUMMARY   DISCHARGE DIAGNOSES:  1. Chronic obstructive pulmonary disease exacerbation.  2. E. coli urinary tract infection.  3. Gastroesophageal reflux disease.  4. History of allergic rhinitis.  5. History of lumbar degenerative disk disease with history of      infection.  6. History of hypercholesterolemia.   CONSULTATIONS:  None.   HISTORY:  The patient is a pleasant 75 year old white female with the  above listed medical problems who presented with complaints of cough and  shortness of breath.  She had seen her primary care physician at the  office and received nebulized bronchodilators with no improvement in her  symptoms.  A chest x-ray was done at the office which was negative for  acute infiltrates.  White cell count was noted to be elevated 16.6 and  urinalysis consistent with a urinary tract infection.  She was sent to  the emergency room for further evaluation and management.  In the  emergency room she received further nebulized bronchodilators,  but was  still not back to her baseline and so was admitted for further  evaluation and management.   Please see the dictated admission history and physical for details of  the admission physical exam as well as the laboratory data.   HOSPITAL COURSE:  1. Pulmonary disease exacerbation.  Upon admission the patient was      started on nebulized bronchodilators, empiric antibiotics,      mucolytics and IV steroids.  She was also maintained on      supplemental oxygen during her hospital stay.  The patient      responded well to this intervention over the course of her hospital     stay, with improvement of her symptoms.  She has been ambulating      well and feels at about her baseline at this time.  She is      tolerating p.o. well.  She has also remained afebrile and she will      be discharged home with prednisone taper oral antibiotics,      nebulized bronchodilators as well as her preadmission medications.      She is to follow up with her primary care physician.  2. Urinary tract infection.  The patient's urinalysis upon admission      was consistent with a UTI and after urine cultures were obtained      she was empirically placed on antibiotics.  The urine cultures grew      E. coli sensitive to fluoroquinolone and she has been maintained on      this.  She is clinically improved and will be discharged at this      time on oral antibiotics to follow up with her primary care  physician.  3. History of gastroesophageal reflux disease.  She will be discharged      on Prilosec.  She is to follow up with her primary care physician.  4. History of allergic rhinitis.  The patient is to continue her      outpatient medications.  5. History of hypercholesterolemia.  The patient is to continue      outpatient medications.   DISCHARGE MEDICATIONS:  1. Prednisone taper as directed.  2. Levaquin 500 mg p.o. daily for five more days.  3. Albuterol nebulizers q. 4-6 h. p.r.n. as directed.  4. Prilosec 40 mg p.o. daily.  5. Mucinex 2 tablets b.i.d.  6. Patient to continue her preadmission medications; Qvar, Spiriva,      Astelin, Zyrtec, Lipitor and Xopenex.   FOLLOW-UP CARE:  Dr. Zachery [name] in one week.   DISCHARGE CONDITION:  1. Improved stable.  2. Patient to continue her home O2.      [name], M.D.  Electronically Signed     ACV/MEDQ  D:  04/01/2007  T:  04/01/2007  Job:  [id number]   cc:   [name], M.D.  Fax: [phone]

## 2010-09-05 NOTE — Op Note (Signed)
[name], [name]              ACCOUNT NO.:  [id number]   MEDICAL RECORD NO.:  [id number]          PATIENT TYPE:  AMB   LOCATION:  ENDO                         FACILITY:  [facility]   PHYSICIAN:  Shirley Friar, MDDATE OF BIRTH:  01/03/1936   DATE OF PROCEDURE:  09/09/2008  DATE OF DISCHARGE:                               OPERATIVE REPORT   PROCEDURE:  Upper endoscopy.   SURGEON:  Shirley Friar, MD   INDICATIONS:  Dysphagia.   MEDICATIONS:  Fentanyl 50 mcg IV, Versed 5 mg IV, and Cetacaine spray  x2.   FINDINGS:  Endoscope was inserted into the oropharynx and esophagus was  intubated which was normal in its entirety.  There was no esophageal  stricture or esophageal ring noted.  The endoscope was easily advanced  through the esophagus down into the stomach.  The endoscope was advanced  into the distal portion of stomach which revealed scattered longitudinal  erythematous streaks consistent with mild antral gastritis.  Retroflexion was done which revealed normal proximal stomach.  Endoscope  was straightened and advanced into the duodenal bulb and second portion  of duodenum which were both normal.   ASSESSMENT:  1. Mild antral gastritis.  2. No esophageal ring or stricture noted.  3. Dilation, not attempted.  4. Suspect dysphagia due to esophageal dysmotility.   PLAN:  Lifestyle modifications in terms of avoiding certain foods that  bring on her dysphagia.      Shirley Friar, MD  Electronically Signed     VCS/MEDQ  D:  09/09/2008  T:  09/10/2008  Job:  [id number]   cc:   Pam Drown, M.D.

## 2010-09-05 NOTE — Op Note (Signed)
NAMESNEHA, WILLIG              ACCOUNT NO.:  [id number]   MEDICAL RECORD NO.:  [id number]          PATIENT TYPE:  INP   LOCATION:  3005                         FACILITY:  [facility]   PHYSICIAN:  Hilda Lias, M.D.   DATE OF BIRTH:  1935/10/11   DATE OF PROCEDURE:  10/07/2007  DATE OF DISCHARGE:                               OPERATIVE REPORT   PREOPERATIVE DIAGNOSES:  Lumbar stenosis with neurogenic claudication,  L4-5 spondylolisthesis, and chronic radiculopathy.   POSTOPERATIVE DIAGNOSES:  Lumbar stenosis with neurogenic claudication,  L4-5 spondylolisthesis, and chronic radiculopathy.   PROCEDURES:  Bilateral L4 laminectomy, bilateral L4-5 discectomy and  facetectomy, interbody fusion with cages, pedicle screws L4-5,  posterolateral arthrodesis, Cell Saver, and C-arm.   SURGEON:  Hilda Lias, MD   CLINICAL HISTORY:  Ms. [name] is a 75 year old female who had been  complaining of back pain racing down to both the legs.  I have been  following this lady for a year.  The history of back pain was more than  10 years.  X-rays show severe stenosis at the level of L4-5 with  spondylolisthesis, and some degenerative facet arthropathy and bone  below.  She had an MRI under general anesthesia 8 months ago, and  yesterday she had a myelogram.  The patient wanted to proceed with  surgery immediately after the myelogram.  She was scheduled for today.  The risks were explained to her in the history and physical.   PROCEDURE:  The patient was taken to the OR, and after intubation she  was positioned in the prone manner.  Then, the skin was cleaned with  DuraPrep.  Midline incision was made from L4-L5 and S1, and muscle was  retracted laterally.  X-ray was obtained, which showed the clamp at the  level of the L4 spinous process.  From then on, we removed the spinous  process in full as well as the lamina.  Immediately what we found that  the patient has quite a bit of thick, yellow  ligament up to the point  that the only way to get underneath the ligament was to use the 1 mm  Kerrison punch.  At the end, we were able to remove the yellow ligament  bilaterally.   Immediately, the thecal sac expanded.  We repeated the x-ray, and it  showed that we were at the L4-L5 disk.  Then, we removed the inferior  facet of L4 bilaterally, and we entered the disk space first into the  left side and later on the right side doing a total gross discectomy.  Two cages of 12 x 22 with autograft and allograft were inserted.  Then,  we brought the C-arm into place, and we probed the pedicle of L4 and the  pedicle of L5.   We introduced the tacks following the C-arm.  Because of the position of  the patient, we were unable to obtain AP view, but only laterally.  We  found that she was quite osteoporotic and pedicle at the L4 into the  body itself.  At the level of L5, we tried to introduce into  the body,  but it was quite loose.  So, we went close to the endplate of L5.  Over  here, we have a good position, the screws were secured in place.  Then,  a rod followed by 4 cuffs were used to keep the rod in place.  Laterally, we removed periosteum of the lateral facet of L4-L5, and a  mix of BMP as well as  autograft was used.  With pin itself, we used the autograft as well as  the BMP.  Then, a cross-link from right to left rod was done.  From then  on, the area was irrigated.  Valsalva maneuver was negative.  The wound  was closed with Vicryl and Steri-Strips.           ______________________________  Hilda Lias, M.D.     EB/MEDQ  D:  10/07/2007  T:  10/08/2007  Job:  [id number]

## 2010-09-05 NOTE — Consult Note (Signed)
NAMEDRAVEN, LAINE              ACCOUNT NO.:  [id number]   MEDICAL RECORD NO.:  [id number]          PATIENT TYPE:  INP   LOCATION:  3005                         FACILITY:  [facility]   PHYSICIAN:  Charlcie Cradle. Delford Field, MD, FCCPDATE OF BIRTH:  [birth year]   DATE OF CONSULTATION:  DATE OF DISCHARGE:                                 CONSULTATION   CHIEF COMPLAINT:  Hypoxemia.   HISTORY OF PRESENT ILLNESS:  A 75 year old white female with moderate  obstructive lung disease, FEV-1 at 60% predicted.  She was still  smoking, however, as of March 2009, recently has not smoked.  She has  had chronic dyspnea and not able to get out of bed without giving out.  This has been going on to a downhill course over the past 6 months.  The  patient had been on Symbicort and then later Spiriva with some  improvement in her status.  She saw Dr. Sherene Sires in March 2009 for followup  and pulmonary function showed an FEV-1 of 60% of predicted.  At that  visit, Spiriva was stopped, but she has since restarted as she felt she  was worse off the Spiriva.  She was continuing to follow a smoking  cessation process.  This patient now is admitted for L4 laminectomy on  October 07, 2007, per Dr. Jeral Fruit.  Over the last couple of days, she has  been on PCA morphine.  This patient continued to be hypoxic postop.  The  PCA morphine was stopped on October 09, 2007.  Today, she is somewhat  better, but worsened.  We are called because of hypoxemia and lethargy.   PAST MEDICAL HISTORY:  Medical history of reflux disease,  hyperlipidemia, COPD, history of cholecystectomy, hysterectomy,  hemorrhoidectomy, hypertension, and diabetes.   CURRENT MEDICATIONS:  1. Claritin 10 mg daily.  2. Zocor 20 mg daily.  3. Protonix 40 mg b.i.d.  4. Spiriva daily.  5. Symbicort 2 sprays b.i.d.  6. Astelin 2 sprays b.i.d., each nostril.  7. Neurontin 300 mg t.i.d.   PHYSICAL EXAMINATION:  GENERAL:  Very sleepy, lethargic female in no  acute  distress.  VITAL SIGNS:  Temperature 99, blood pressure 101/50, pulse 86,  respirations 18, and saturation 92%.  CHEST:  Showed diminished breath sounds with prolonged expiratory phase,  few expiratory wheezes noted.  CARDIAC:  Regular rate and rhythm without S3, normal S1 and S2.  ABDOMEN:  Soft and nontender.  EXTREMITY:  No edema, clubbing, or venous disease.  SKIN:  Clear.  NEUROLOGIC:  Intact.  HEENT:  No jugular venous distention and no lymphadenopathy.  Oropharynx  is clear.  NECK:  Supple.   LABORATORY DATA:  Hemoglobin 13.8, white count 10.6, sodium 138,  potassium 4.3, chloride 98, CO2 of 29, BUN 90, and creatinine 0.6.   CHEST X-RAY:  Not obtained postop.   IMPRESSION:  Moderate chronic obstructive lung disease in the setting of  usage of narcotics and Neurontin with hypoventilation, hypoxemia, and  oversedation.   RECOMMENDATIONS:  Discontinue Neurontin.  Give nebulizer treatments,  instead of DPI as she will inhale these better, give flutter valve,  no  systemic steroids, and no antibiotics indicated.  Check chest x-ray and  blood gas and postop labs.      Charlcie Cradle Delford Field, MD, [facility]  Electronically Signed     PEW/MEDQ  D:  10/10/2007  T:  10/11/2007  Job:  [id number]   cc:   Hilda Lias, M.D.  Anna Genre Little, M.D.

## 2010-09-05 NOTE — H&P (Signed)
[name], [name]              ACCOUNT NO.:  [id number]   MEDICAL RECORD NO.:  [id number]          PATIENT TYPE:  INP   LOCATION:  1406                         [facility]ACILITY:  [facility]   PHYSICIAN:  Kela Millin, M.D.DATE OF BIRTH:  [birth year]   DATE OF ADMISSION:  03/28/2007  DATE OF DISCHARGE:                              HISTORY & PHYSICAL   PRIMARY CARE PHYSICIAN:  Dr. Zachery Dauer.   CHIEF COMPLAINT:  Cough and shortness of breath.   HISTORY OF PRESENT ILLNESS:  The patient is a 75 year old white female  with past medical history significant for COPD, allergic rhinitis on  home O2 q.h.s., GERD, hypercholesterolemia who presents with above  complaints.  She says that for the past three days she has had a cough  productive of greenish sputum, and over the past two days, she also has  developed worsening shortness of breath.  She admits to subjective  fevers times one day and decreased p.o. intake.  Ms. Olveda denies chest  pain, nausea, vomiting and no diarrhea.  She initially went to her  primary care physician's office and a chest x-ray was done which did not  show any acute disease.  Her white cell count was elevated at 16.6 and a  urinalysis showed 2+ leukocytes and nitrates, also, trace blood, but no  microscopic done.  Per Dr. Zachery Dauer, after receiving DuoNeb  bronchodilators at the office, her symptoms.   DICTATION ENDED HERE.      Kela Millin, M.D.  Electronically Signed     ACV/MEDQ  D:  03/30/2007  T:  03/30/2007  Job:  [id number]

## 2010-09-08 NOTE — Discharge Summary (Signed)
[name], [name]              ACCOUNT NO.:  [id number]   MEDICAL RECORD NO.:  [id number]          PATIENT TYPE:  INP   LOCATION:  3005                         FACILITY:  [facility]   PHYSICIAN:  Hilda Lias, M.D.   DATE OF BIRTH:  11/06/1935   DATE OF ADMISSION:  10/07/2007  DATE OF DISCHARGE:  10/13/2007                               DISCHARGE SUMMARY   ADMISSION DIAGNOSES:  1. L4-L5 degenerative disk disease.  2. Chronic radiculopathy L4-L5.  3. Spondylolisthesis.   FINAL DIAGNOSES:  1. L4-L5 degenerative disk disease.  2. Chronic radiculopathy L4-L5.  3. Spondylolisthesis.  4. Hypoxemia.   CLINICAL HISTORY:  [name] is a 75 year old female admitted to the  hospital because of chronic back pain with radiation to both legs.  The  patient has failed conservative treatment.  Surgery was advised.   LABORATORY:  Within normal limits.   COURSE IN THE HOSPITAL:  The patient was taken to Surgery and L4-L5  fusion was done.  After surgery, the patient became quite sleepy.  She  has difficulty breathing and we called the pulmonologist for further  evaluation.  The patient was started on aggressive conservative  treatment including nebulizer and oxygen.  All the medications for pain  will be eventually discontinued.  The patient became more awake.  She  was seen by the physical therapist.  Because she was going to require  further treatment including rehabilitation, she was transferred to [facility] on October 13, 2007.   CONDITION ON DISCHARGE:  Stable.   DIET:  Regular.   ACTIVITY:  Up to rehab.   MEDICATIONS:  She will be continued on same medications as she was at  [facility].   FOLLOWUP:  I will continue to assist [name] while she is in the rehab  unit and later on in my office once she is discharged.          ______________________________  Hilda Lias, M.D.    EB/MEDQ  D:  12/16/2007  T:  12/17/2007  Job:  [id number]

## 2010-09-08 NOTE — H&P (Signed)
[name], [name]              ACCOUNT NO.:  [id number]   MEDICAL RECORD NO.:  [id number]          PATIENT TYPE:  INP   LOCATION:  0102                         FACILITY:  [facility]   PHYSICIAN:  Candyce Churn, M.D.DATE OF BIRTH:  [birth year]   DATE OF ADMISSION:  04/27/2006  DATE OF DISCHARGE:                              HISTORY & PHYSICAL   PRIMARY CARE PHYSICIAN:  Gweneth Dimitri, M.D.   CHIEF COMPLAINT:  Uncontrolled productive cough, headache, and fever.   HISTORY OF PRESENT ILLNESS:  [name] is a very pleasant 75 year old  female with history of:  1. COPD sinus asthmatic bronchitis.  2. Extensive smoking history, stopped 3 years ago.  3. GERD.  4. Obesity.  5. Allergic rhinitis.  6. Hypercholesterolemia.  She presents with 2 months of wheezing. Treated with Avelox,  prednisone taper, Guaifenesin, and Astelin on April 12, 2006 for  approximately 1 week with improvement in symptoms, then they  deteriorated after the medicines were stopped. O2 saturations were 89%  and 90%. The patient was having a lot of respiratory distress. She is  exhausted and is admitted for IV therapy for probable un-resolving  asthmatic bronchitis and also has a headache and possible sinusitis. She  denies any exertional chest pain. No history of coronary artery disease  or pulmonary embolism. She is having green rhinorrhea and productive  phlegm from her chest as well.   MEDICATIONS:  1. Allegra 180 mg daily.  2. Omeprazole 20 mg to 40 mg daily.  3. Albuterol inhaler p.r.n.  4. Qvar 2 puffs b.i.d.  5. Astelin nasal spray - ? dose.  6. Spiriva 18 mcg inhaled daily.  7. She has recently had Avelox and prednisone but has been off this      for approximately 6 days.   ALLERGIES:  NO KNOWN DRUG ALLERGIES.   PAST SURGICAL HISTORY:  1. Total abdominal hysterectomy and bilateral salpingo-oophorectomy.  2. Appendectomy.  3. Cholecystectomy.  4. Tonsillectomy and adenoidectomy.  5.  Lumbar degenerative disk disease with 9 steroid infections.   FAMILY HISTORY:  Lung cancer in her father who never smoked but was a  Investment banker, [employer]. Mother died of probable ovarian cancer and a  sister died of a pelvic cancer as well.   SOCIAL HISTORY:  Significant for many years of tobacco use. Stopped  several years ago.   PHYSICAL EXAMINATION:  GENERAL:  She is a moderately obese female with  mild respiratory distress.  VITAL SIGNS:  Respiratory rate is now 22. It was 36 on admission.  Temperature 100.2. Pulse 114. O2 saturation on 2 liters is 96%. On room  air, it was 80% to 89%.  HEENT:  Examination reveals a deviated septum to the left. There may be  a small verrucous vulgaris 1/2 x 1/2 cm, just inside the left nare. No  polyps noted. Oropharynx is clear.  CHEST:  Diffuse expiratory wheezes noted. Cough with rhonchi.  CARDIAC:  Regular rhythm. No murmurs or gallops.  ABDOMEN:  Soft, nontender to palpation. Well healed abdominal scars.  BREAST:  Not examined.  RECTAL:  Examination deferred.  EXTREMITIES:  Without edema. Warm distally. No cyanosis. She does have  some superficial varicosities.  NEUROLOGIC:  Oriented x3. Non-focal.  SKIN:  Without rashes.   LABORATORY DATA:  Chest x-ray reveals no active disease. The diaphragms  are not flat, though lung volumes do not appears to be increased.   EKG reveals sinus tachycardia with PAC's, non-specific ST wave changes,  no ST segment elevation or depression.   White count is 21,100. Hemoglobin 13.7. Platelet count 397,000 with 80  neutrophils. Sodium 132, potassium 3.3, chloride 96, bicarb 26, BUN 14,  creatinine 0.78, glucose 136. Urinalysis and TSH are pending.   ASSESSMENT:  A 75 year old female with fever, headache, purulent  rhinorrhea, and purulent productive cough. Likely has sinusitis and  bacterial bronchitis with asthmatic bronchitis. Hard to tell how bad her  lung function is. She does need PFT's. Also has a  history of allergic  rhinitis. Denies neck pain or stiff neck.   PLAN:  1. HEADACHE:  Intravenous Toradol X24 to 48 hours. Check sinus x-rays.      Rocephin started. Will add Azithromycin.  2. PULMONARY:  Use Xopenex nebs because of increased heart rate. Check      PFT's. Use IV Solu-Medrol, nasal cannula O2, Mucinex, and Spiriva.  3. CHRONIC RHINITIS:  Continue Astelin and start nasal saline spray.  4. GASTROESOPHAGEAL REFLUX DISEASE:  Will continue PPI therapy.  5. HYPERCHOLESTEROLEMIA:  Will check a Statin, LFT, and cholesterol      profile.      Candyce Churn, M.D.  Electronically Signed     RNG/MEDQ  D:  04/28/2006  T:  04/28/2006  Job:  [id number]   cc:   Pam Drown, M.D.  Fax: [phone]   Kela Millin, M.D.

## 2010-09-08 NOTE — Discharge Summary (Signed)
[name], [name]              ACCOUNT NO.:  [id number]   MEDICAL RECORD NO.:  [id number]          PATIENT TYPE:  INP   LOCATION:  1338                         FACILITY:  [facility]   PHYSICIAN:  Kela Millin, M.D.DATE OF BIRTH:  06/21/1935   DATE OF ADMISSION:  04/27/2006  DATE OF DISCHARGE:  05/01/2006                               DISCHARGE SUMMARY   DISCHARGE DIAGNOSES:  1. Chronic obstructive pulmonary disease exacerbation.  2. Probable urinary tract infection.  3. Allergic rhinitis.  4. History of gastroesophageal reflux disease.  5. History of hypercholesterolemia.  6. Obesity.   HISTORY OF PRESENT ILLNESS:  The patient is a 75 year old white female  with a history of COPD/asthmatic bronchitis, past history of smoking  (quit 3 years ago), GERD, obesity, allergic rhinitis and  hypercholesterolemia, who presented with complaints of a productive  cough, headaches, and fevers.  She states that she had been wheezing  for about 2 months.  She was treated with Avelox, prednisone taper,  guaifenesin, and Astelin on December 21, for about 1 week with  improvement in her symptoms, but she deteriorated after the medications  were stopped.  O2 saturations were reported to be ranging 89-90% and the  patient was complaining of a lot of respiratory distress.  She also  stated that she felt exhausted and she was admitted to the [facility] for further evaluation and management.  She denied  exertional chest pain.  Admitted to rhinorrhea - greenish, and also  cough productive of phlegm.   ADMISSION PHYSICAL EXAMINATION:  As per Candyce Churn, M.D.  GENERAL:  She was a moderately obese white female in moderate  respiratory distress.  VITAL SIGNS:  Respiratory rate 22, initially 36, temperature 100.2,  pulse 114, O2 saturations 96% on 2 liters.  On room air it was reported  to be 80-89%.  HEENT:  Deviated septum to the left, questionable small verrucous  vulgaris 0.5 x 0.5 cm just inside the left nares and no polyps noted.  LUNGS:  Diffuse expiratory wheezes and rhonchi.  EXTREMITIES:  No edema, warm superficial varicosities noted.  The rest of her physical examination was reported to be within normal  limits.   Chest x-ray showed no active disease and EKG showed sinus tachycardia  with PAC's, nonspecific T wave changes, no ST segment elevation or  depression.   LABORATORY DATA:  White count 21, hemoglobin 13.7, platelet count 397,  neutrophil count 80%. Sodium 132, potassium 3.3, chloride 96, bicarb 26,  BUN 14, creatinine 0.78, glucose 136.  Urinalysis; cloudy in appearance.  Specific gravity 1.034, small leukocyte esterase, 3-6 WBC's, few  bacteria, and PSH was within normal limits at 0.549.   HOSPITAL COURSE:  Problem 1.  Chronic obstructive pulmonary disease.  Upon admission the patient was placed on nebulized bronchodilators,  antibiotics, and steroids.  Expectorants - antitussives were also added  for symptom management.  She was maintained on supplemental O2.  The  patient's symptoms gradually improved.  She was changed from the IV  steroids to prednisone and tapered.  She was on supplemental oxygen and  she  was mobilized as she improved.  The patient remained afebrile, her  symptoms improved, she was hemodynamically stable, and was discharged  home to follow up with her primary care physician.   Problem 2.  Hypokalemia.  Her potassium was replaced during her hospital  stay.   Problem 3.  Allergic/chronic rhinitis.  The patient was maintained on  Astelin and also saline nasal spray added and as above, she was on  steroids during her hospital stay.   Problem 4.  GERD.  The patient was maintained on PPI's during her  hospital stay.   DISCHARGE MEDICATIONS:  1. Prednisone taper over 12 days.  2. Avelox 400 mg p.o. daily for 5 more days.  3. The patient is to continue her preadmission medications.   FOLLOWUP:  Pam Drown, M.D. in 1 week.   CONDITION ON DISCHARGE:  Improved/stable.      Kela Millin, M.D.  Electronically Signed     ACV/MEDQ  D:  05/30/2006  T:  05/30/2006  Job:  [id number]

## 2011-01-18 LAB — CBC
HCT: 30 — ABNORMAL LOW
HCT: 40.8
Hemoglobin: 10 — ABNORMAL LOW
Hemoglobin: 10.1 — ABNORMAL LOW
MCHC: 33.6
MCHC: 33.7
MCHC: 33.9
MCV: 92.2
MCV: 92.3
Platelets: 317
Platelets: 381
RDW: 12.4
RDW: 12.7

## 2011-01-18 LAB — URINE CULTURE
Colony Count: >=100000
Colony Count: >=100000
Special Requests: NEGATIVE
Special Requests: POSITIVE

## 2011-01-18 LAB — URINE MICROSCOPIC-ADD ON

## 2011-01-18 LAB — BASIC METABOLIC PANEL WITH GFR
BUN: 9
BUN: 9
CO2: 29
CO2: 33 — ABNORMAL HIGH
Calcium: 8.5
Calcium: 9.2
Chloride: 93 — ABNORMAL LOW
Chloride: 98
Creatinine, Ser: 0.47
Creatinine, Ser: 0.63
GFR calc non Af Amer: >60
GFR calc non Af Amer: >60
Glucose, Bld: 110 — ABNORMAL HIGH
Glucose, Bld: 118 — ABNORMAL HIGH
Potassium: 4.3
Potassium: 4.3
Sodium: 134 — ABNORMAL LOW
Sodium: 138

## 2011-01-18 LAB — DIFFERENTIAL
Basophils Absolute: 0
Basophils Relative: 0
Eosinophils Absolute: 0.1
Eosinophils Relative: 2
Lymphocytes Relative: 25
Lymphs Abs: 1.9
Monocytes Absolute: 0.7
Monocytes Relative: 10
Neutro Abs: 4.9
Neutrophils Relative %: 63

## 2011-01-18 LAB — TYPE AND SCREEN
ABO/RH(D): B NEG
Antibody Screen: NEGATIVE

## 2011-01-18 LAB — URINALYSIS, ROUTINE W REFLEX MICROSCOPIC
Glucose, UA: NEGATIVE
Hgb urine dipstick: NEGATIVE
Ketones, ur: NEGATIVE
Ketones, ur: NEGATIVE
Nitrite: POSITIVE — AB
Protein, ur: NEGATIVE
Protein, ur: NEGATIVE
pH: 6.5
pH: 8.5 — ABNORMAL HIGH

## 2011-01-18 LAB — BLOOD GAS, ARTERIAL
Acid-Base Excess: 12 — ABNORMAL HIGH
Drawn by: 266171
O2 Content: 2
O2 Saturation: 97.7
pO2, Arterial: 99.8

## 2011-01-18 LAB — COMPREHENSIVE METABOLIC PANEL
BUN: 8
CO2: 36 — ABNORMAL HIGH
Calcium: 8.4
Creatinine, Ser: 0.56
GFR calc non Af Amer: >60
Glucose, Bld: 114 — ABNORMAL HIGH
Sodium: 137
Total Protein: 4.9 — ABNORMAL LOW

## 2011-01-29 LAB — BASIC METABOLIC PANEL
BUN: 13
BUN: 14
CO2: 30
Calcium: 8.5
Calcium: 8.7
Calcium: 8.7
Chloride: 100
Chloride: 96
Chloride: 96
Creatinine, Ser: 0.65
Creatinine, Ser: 0.68
Creatinine, Ser: 0.7
GFR calc Af Amer: >60
GFR calc Af Amer: >60
GFR calc Af Amer: >60
GFR calc non Af Amer: >60
Glucose, Bld: 173 — ABNORMAL HIGH
Glucose, Bld: 181 — ABNORMAL HIGH
Potassium: 4.3
Potassium: 4.4
Sodium: 135
Sodium: 139

## 2011-01-29 LAB — URINALYSIS, ROUTINE W REFLEX MICROSCOPIC
Glucose, UA: NEGATIVE
Protein, ur: NEGATIVE
Specific Gravity, Urine: 1.013
Urobilinogen, UA: 0.2

## 2011-01-29 LAB — CBC
HCT: 35 — ABNORMAL LOW
HCT: 35.5 — ABNORMAL LOW
Hemoglobin: 12.3
MCHC: 34.4
MCHC: 34.9
MCV: 90.8
MCV: 90.8
Platelets: 385
Platelets: 387
RBC: 3.91
RDW: 13.1
WBC: 15.9 — ABNORMAL HIGH
WBC: 17 — ABNORMAL HIGH

## 2011-01-29 LAB — DIFFERENTIAL
Eosinophils Relative: 0
Lymphocytes Relative: 5 — ABNORMAL LOW
Lymphs Abs: 0.6 — ABNORMAL LOW
Monocytes Absolute: 0.1
Monocytes Relative: 1 — ABNORMAL LOW
Neutro Abs: 10.9 — ABNORMAL HIGH

## 2011-01-29 LAB — URINE CULTURE

## 2011-01-29 LAB — URINE MICROSCOPIC-ADD ON

## 2011-06-29 ENCOUNTER — Encounter (HOSPITAL_COMMUNITY): Payer: Self-pay | Admitting: Emergency Medicine

## 2011-06-29 ENCOUNTER — Other Ambulatory Visit: Payer: Self-pay

## 2011-06-29 ENCOUNTER — Emergency Department (HOSPITAL_COMMUNITY): Payer: Medicare Other

## 2011-06-29 ENCOUNTER — Inpatient Hospital Stay (HOSPITAL_COMMUNITY)
Admission: EM | Admit: 2011-06-29 | Discharge: 2011-07-04 | Disposition: A | Discharge status: Home / Self Care | Payer: Medicare Other | Source: Home / Self Care | Attending: Family Medicine | Admitting: Family Medicine

## 2011-06-29 DIAGNOSIS — F17200 Nicotine dependence, unspecified, uncomplicated: Secondary | ICD-10-CM | POA: Diagnosis present

## 2011-06-29 DIAGNOSIS — K219 Gastro-esophageal reflux disease without esophagitis: Secondary | ICD-10-CM

## 2011-06-29 DIAGNOSIS — J209 Acute bronchitis, unspecified: Secondary | ICD-10-CM | POA: Diagnosis not present

## 2011-06-29 DIAGNOSIS — G4700 Insomnia, unspecified: Secondary | ICD-10-CM

## 2011-06-29 DIAGNOSIS — R0789 Other chest pain: Secondary | ICD-10-CM | POA: Diagnosis not present

## 2011-06-29 DIAGNOSIS — J189 Pneumonia, unspecified organism: Secondary | ICD-10-CM | POA: Diagnosis not present

## 2011-06-29 DIAGNOSIS — R0609 Other forms of dyspnea: Secondary | ICD-10-CM

## 2011-06-29 DIAGNOSIS — Z9981 Dependence on supplemental oxygen: Secondary | ICD-10-CM | POA: Diagnosis not present

## 2011-06-29 DIAGNOSIS — R071 Chest pain on breathing: Secondary | ICD-10-CM | POA: Diagnosis not present

## 2011-06-29 DIAGNOSIS — J449 Chronic obstructive pulmonary disease, unspecified: Secondary | ICD-10-CM | POA: Diagnosis present

## 2011-06-29 DIAGNOSIS — R0989 Other specified symptoms and signs involving the circulatory and respiratory systems: Secondary | ICD-10-CM | POA: Diagnosis not present

## 2011-06-29 DIAGNOSIS — E876 Hypokalemia: Secondary | ICD-10-CM | POA: Diagnosis present

## 2011-06-29 DIAGNOSIS — K5900 Constipation, unspecified: Secondary | ICD-10-CM

## 2011-06-29 DIAGNOSIS — D72829 Elevated white blood cell count, unspecified: Secondary | ICD-10-CM | POA: Diagnosis present

## 2011-06-29 DIAGNOSIS — E860 Dehydration: Secondary | ICD-10-CM | POA: Diagnosis not present

## 2011-06-29 DIAGNOSIS — R05 Cough: Secondary | ICD-10-CM | POA: Diagnosis not present

## 2011-06-29 DIAGNOSIS — J90 Pleural effusion, not elsewhere classified: Secondary | ICD-10-CM | POA: Diagnosis not present

## 2011-06-29 DIAGNOSIS — R059 Cough, unspecified: Secondary | ICD-10-CM | POA: Diagnosis not present

## 2011-06-29 DIAGNOSIS — R918 Other nonspecific abnormal finding of lung field: Secondary | ICD-10-CM | POA: Diagnosis not present

## 2011-06-29 DIAGNOSIS — R079 Chest pain, unspecified: Secondary | ICD-10-CM | POA: Diagnosis present

## 2011-06-29 DIAGNOSIS — R072 Precordial pain: Secondary | ICD-10-CM | POA: Diagnosis not present

## 2011-06-29 DIAGNOSIS — E785 Hyperlipidemia, unspecified: Secondary | ICD-10-CM | POA: Diagnosis present

## 2011-06-29 DIAGNOSIS — J441 Chronic obstructive pulmonary disease with (acute) exacerbation: Principal | ICD-10-CM | POA: Diagnosis present

## 2011-06-29 DIAGNOSIS — J438 Other emphysema: Secondary | ICD-10-CM | POA: Diagnosis not present

## 2011-06-29 DIAGNOSIS — D696 Thrombocytopenia, unspecified: Secondary | ICD-10-CM | POA: Diagnosis not present

## 2011-06-29 DIAGNOSIS — R0602 Shortness of breath: Secondary | ICD-10-CM | POA: Diagnosis not present

## 2011-06-29 DIAGNOSIS — R197 Diarrhea, unspecified: Secondary | ICD-10-CM | POA: Diagnosis not present

## 2011-06-29 DIAGNOSIS — J984 Other disorders of lung: Secondary | ICD-10-CM | POA: Diagnosis not present

## 2011-06-29 HISTORY — DX: Other specified postprocedural states: Z98.890

## 2011-06-29 HISTORY — DX: Gastro-esophageal reflux disease without esophagitis: K21.9

## 2011-06-29 HISTORY — DX: Chronic obstructive pulmonary disease, unspecified: J44.9

## 2011-06-29 HISTORY — DX: Chronic obstructive pulmonary disease with (acute) exacerbation: J44.1

## 2011-06-29 HISTORY — DX: Nausea with vomiting, unspecified: R11.2

## 2011-06-29 LAB — DIFFERENTIAL
Basophils Relative: 0 % (ref 0–1)
Lymphocytes Relative: 6 % — ABNORMAL LOW (ref 12–46)
Lymphs Abs: 1.5 10*3/uL (ref 0.7–4.0)
Monocytes Relative: 6 % (ref 3–12)
Neutro Abs: 20.6 10*3/uL — ABNORMAL HIGH (ref 1.7–7.7)
Neutrophils Relative %: 88 % — ABNORMAL HIGH (ref 43–77)

## 2011-06-29 LAB — CBC
HCT: 38.1 % (ref 36.0–46.0)
Hemoglobin: 14.1 g/dL (ref 12.0–15.0)
MCHC: 32.9 g/dL (ref 30.0–36.0)
MCHC: 31.8 g/dL (ref 30.0–36.0)
MCV: 91.1 fL (ref 78.0–100.0)
Platelets: 293 10*3/uL (ref 150–400)
RBC: 4.71 MIL/uL (ref 3.87–5.11)
RDW: 13.6 % (ref 11.5–15.5)
WBC: 23.4 10*3/uL — ABNORMAL HIGH (ref 4.0–10.5)
WBC: 19 10*3/uL — ABNORMAL HIGH (ref 4.0–10.5)

## 2011-06-29 LAB — BASIC METABOLIC PANEL
Chloride: 97 mEq/L (ref 96–112)
GFR calc Af Amer: >90 mL/min (ref >90)
GFR calc non Af Amer: 88 mL/min — ABNORMAL LOW (ref >90)
Potassium: 3.6 mEq/L (ref 3.5–5.1)
Sodium: 136 mEq/L (ref 135–145)

## 2011-06-29 LAB — BLOOD GAS, ARTERIAL
Bicarbonate: 29.5 mEq/L — ABNORMAL HIGH (ref 20.0–24.0)
Patient temperature: 98.6
pCO2 arterial: 44.5 mmHg (ref 35.0–45.0)
pH, Arterial: 7.436 — ABNORMAL HIGH (ref 7.350–7.400)

## 2011-06-29 MED ORDER — DEXTROSE 5 % IV SOLN
500.0000 mg | Freq: Every day | INTRAVENOUS | Status: DC
  Administered 2011-06-29 – 2011-07-02 (×4): 500 mg via INTRAVENOUS
  Filled 2011-06-29 (×5): qty 500

## 2011-06-29 MED ORDER — ONDANSETRON HCL 4 MG PO TABS: 4.0000 mg | ORAL_TABLET | Freq: Four times a day (QID) | ORAL | Status: DC | PRN

## 2011-06-29 MED ORDER — AZITHROMYCIN 500 MG IV SOLR
500.0000 mg | Freq: Once | INTRAVENOUS | Status: DC
  Filled 2011-06-29: qty 500

## 2011-06-29 MED ORDER — ACETAMINOPHEN 325 MG PO TABS
650.0000 mg | ORAL_TABLET | Freq: Once | ORAL | Status: AC
  Administered 2011-06-29: 650 mg via ORAL
  Filled 2011-06-29: qty 2

## 2011-06-29 MED ORDER — OMEGA-3-ACID ETHYL ESTERS 1 G PO CAPS
2.0000 g | ORAL_CAPSULE | Freq: Every day | ORAL | Status: DC
  Administered 2011-06-30 – 2011-07-04 (×5): 2 g via ORAL
  Filled 2011-06-29 (×5): qty 2

## 2011-06-29 MED ORDER — MAGNESIUM OXIDE 400 MG PO TABS
400.0000 mg | ORAL_TABLET | Freq: Two times a day (BID) | ORAL | Status: DC
  Administered 2011-06-29 – 2011-07-04 (×10): 400 mg via ORAL
  Filled 2011-06-29 (×12): qty 1

## 2011-06-29 MED ORDER — DEXTROSE 5 % IV SOLN
1.0000 g | Freq: Once | INTRAVENOUS | Status: AC
  Administered 2011-06-29: 1 g via INTRAVENOUS
  Filled 2011-06-29: qty 10

## 2011-06-29 MED ORDER — CALCIUM CARBONATE-VITAMIN D 500-200 MG-UNIT PO TABS
1.0000 | ORAL_TABLET | Freq: Two times a day (BID) | ORAL | Status: DC
  Administered 2011-06-29 – 2011-07-04 (×10): 1 via ORAL
  Filled 2011-06-29 (×13): qty 1

## 2011-06-29 MED ORDER — LORATADINE 10 MG PO TABS
10.0000 mg | ORAL_TABLET | Freq: Every day | ORAL | Status: DC
  Administered 2011-06-30 – 2011-07-04 (×5): 10 mg via ORAL
  Filled 2011-06-29 (×5): qty 1

## 2011-06-29 MED ORDER — PREDNISONE 20 MG PO TABS
60.0000 mg | ORAL_TABLET | Freq: Once | ORAL | Status: AC
  Administered 2011-06-29: 60 mg via ORAL
  Filled 2011-06-29: qty 3

## 2011-06-29 MED ORDER — ALBUTEROL SULFATE (5 MG/ML) 0.5% IN NEBU
5.0000 mg | INHALATION_SOLUTION | Freq: Once | RESPIRATORY_TRACT | Status: AC
  Administered 2011-06-29: 5 mg via RESPIRATORY_TRACT

## 2011-06-29 MED ORDER — BUDESONIDE-FORMOTEROL FUMARATE 160-4.5 MCG/ACT IN AERO
2.0000 | INHALATION_SPRAY | Freq: Two times a day (BID) | RESPIRATORY_TRACT | Status: DC
  Administered 2011-06-30 – 2011-07-04 (×9): 2 via RESPIRATORY_TRACT
  Filled 2011-06-29: qty 6

## 2011-06-29 MED ORDER — LEVALBUTEROL HCL 0.63 MG/3ML IN NEBU
0.6300 mg | INHALATION_SOLUTION | Freq: Four times a day (QID) | RESPIRATORY_TRACT | Status: DC
  Administered 2011-06-30 – 2011-07-04 (×19): 0.63 mg via RESPIRATORY_TRACT
  Filled 2011-06-29 (×23): qty 3

## 2011-06-29 MED ORDER — MORPHINE SULFATE 2 MG/ML IJ SOLN
2.0000 mg | INTRAMUSCULAR | Status: DC | PRN
  Administered 2011-07-01 (×3): 2 mg via INTRAVENOUS
  Filled 2011-06-29 (×3): qty 1

## 2011-06-29 MED ORDER — DEXTROSE 5 % IV SOLN
1.0000 g | Freq: Every day | INTRAVENOUS | Status: DC
  Administered 2011-06-30 – 2011-07-02 (×3): 1 g via INTRAVENOUS
  Filled 2011-06-29 (×4): qty 10

## 2011-06-29 MED ORDER — ENOXAPARIN SODIUM 40 MG/0.4ML ~~LOC~~ SOLN
40.0000 mg | SUBCUTANEOUS | Status: DC
  Administered 2011-06-29 – 2011-07-03 (×5): 40 mg via SUBCUTANEOUS
  Filled 2011-06-29 (×6): qty 0.4

## 2011-06-29 MED ORDER — ASPIRIN EC 81 MG PO TBEC
81.0000 mg | DELAYED_RELEASE_TABLET | Freq: Every day | ORAL | Status: DC
  Administered 2011-06-30 – 2011-07-04 (×5): 81 mg via ORAL
  Filled 2011-06-29 (×5): qty 1

## 2011-06-29 MED ORDER — ONDANSETRON HCL 4 MG/2ML IJ SOLN
4.0000 mg | Freq: Four times a day (QID) | INTRAMUSCULAR | Status: DC | PRN
  Administered 2011-07-01: 4 mg via INTRAVENOUS
  Filled 2011-06-29: qty 2

## 2011-06-29 MED ORDER — VITAMIN D 1000 UNITS PO TABS
2000.0000 [IU] | ORAL_TABLET | Freq: Every day | ORAL | Status: DC
  Administered 2011-06-30 – 2011-07-04 (×5): 2000 [IU] via ORAL
  Filled 2011-06-29 (×5): qty 2

## 2011-06-29 MED ORDER — ALBUTEROL SULFATE (5 MG/ML) 0.5% IN NEBU
5.0000 mg | INHALATION_SOLUTION | RESPIRATORY_TRACT | Status: AC
  Administered 2011-06-29: 5 mg via RESPIRATORY_TRACT
  Filled 2011-06-29: qty 1

## 2011-06-29 MED ORDER — IPRATROPIUM BROMIDE 0.02 % IN SOLN
0.5000 mg | Freq: Once | RESPIRATORY_TRACT | Status: AC
  Administered 2011-06-29: 0.5 mg via RESPIRATORY_TRACT
  Filled 2011-06-29: qty 2.5

## 2011-06-29 MED ORDER — TIOTROPIUM BROMIDE MONOHYDRATE 18 MCG IN CAPS
18.0000 ug | ORAL_CAPSULE | Freq: Every day | RESPIRATORY_TRACT | Status: DC
  Administered 2011-06-30 – 2011-07-04 (×5): 18 ug via RESPIRATORY_TRACT
  Filled 2011-06-29 (×2): qty 5

## 2011-06-29 MED ORDER — GABAPENTIN 100 MG PO CAPS
200.0000 mg | ORAL_CAPSULE | Freq: Every day | ORAL | Status: DC
  Administered 2011-06-30 – 2011-07-04 (×5): 200 mg via ORAL
  Filled 2011-06-29 (×5): qty 2

## 2011-06-29 MED ORDER — SODIUM CHLORIDE 0.9 % IJ SOLN
3.0000 mL | INTRAMUSCULAR | Status: DC | PRN
  Administered 2011-06-30 – 2011-07-02 (×2): 3 mL via INTRAVENOUS

## 2011-06-29 MED ORDER — ATORVASTATIN CALCIUM 40 MG PO TABS
40.0000 mg | ORAL_TABLET | Freq: Every day | ORAL | Status: DC
  Administered 2011-06-30 – 2011-07-04 (×5): 40 mg via ORAL
  Filled 2011-06-29 (×5): qty 1

## 2011-06-29 MED ORDER — GABAPENTIN 300 MG PO CAPS
300.0000 mg | ORAL_CAPSULE | Freq: Every day | ORAL | Status: DC
  Administered 2011-06-30 – 2011-07-03 (×4): 300 mg via ORAL
  Filled 2011-06-29 (×5): qty 1

## 2011-06-29 MED ORDER — SODIUM CHLORIDE 0.9 % IV SOLN: 250.0000 mL | INTRAVENOUS | Status: DC | PRN

## 2011-06-29 MED ORDER — SODIUM CHLORIDE 0.9 % IJ SOLN
3.0000 mL | Freq: Two times a day (BID) | INTRAMUSCULAR | Status: DC
  Administered 2011-06-30 – 2011-07-04 (×9): 3 mL via INTRAVENOUS

## 2011-06-29 NOTE — ED Notes (Signed)
Pt states she has been having chest pain since today and yesterday.  When coughing pain worse

## 2011-06-29 NOTE — ED Notes (Signed)
Pt. Ambulated and her oxygen level was at 92% and notified the PA of her oxygen level.

## 2011-06-29 NOTE — ED Notes (Signed)
Sob since last night/yesterday.  Sore throat on Wed.  Pt wears O2 at 2l/[place] at all times but removed it when she got to the hospital.  Sats on RA 78%.  O2 at 2L/[place] placed and sats up to 96-97%.   Pt hasn't had a cigarette in 2 days stating she's been too sick to smoke.  Also states her head feels like it's going to explode.  Didn't call EMS last night b/c was afraid she'd have to go to [facility] hospital since she lives in [place].

## 2011-06-29 NOTE — H&P (Signed)
PCP:   MCNEILL,WENDY, MD, MD   Chief Complaint: Shortness of breath and coughing   HPI: [name] is an 76 y.o. female with history of emphysema and unfortunately a continued active smoker, GERD, on home O2, presents to [facility] long emergency room the recommendation of her PCP because of the thick yellow sputum productive cough and increased shortness of breath for the past several days. She said she has substernal chest pressure as well for 2 days. There has been no fever, chills, distant travel, myalgia, ill contact, abdominal pains or cramps, nausea or vomiting. Evaluation in the emergency room included a chest x-ray which failed to show any definite infiltrate. ABG shows 7.43/45/PaO2 63. She has marked leukocytosis with a white count 23,000, normal renal function and electrolytes. Her cardiac markers were negative as well. Hospitalist was asked to admit this patient for presumed community-acquired pneumonia. Rewiew of Systems:  The patient denies anorexia, fever, weight loss,, vision loss, decreased hearing, hoarseness, chest pain, syncope, , peripheral edema, balance deficits, hemoptysis, abdominal pain, melena, hematochezia, severe indigestion/heartburn, hematuria, incontinence, genital sores, muscle weakness, suspicious skin lesions, transient blindness, difficulty walking, depression, unusual weight change, abnormal bleeding, enlarged lymph nodes, angioedema, and breast masses.    Past Medical History  Diagnosis Date  . COPD (chronic obstructive pulmonary disease)   . Asthma   . GERD (gastroesophageal reflux disease)   . PONV (postoperative nausea and vomiting)     Past Surgical History  Procedure Date  . Abdominal hysterectomy   . Tonsillectomy   . Appendectomy   . Back surgery   . Gallbladder surgery     Medications:  HOME MEDS: Prior to Admission medications   Medication Sig Start Date End Date Taking? Authorizing Provider  albuterol (PROVENTIL HFA;VENTOLIN HFA) 108  (90 BASE) MCG/ACT inhaler Inhale 2 puffs into the lungs every 6 (six) hours as needed. For shortness of breath.   Yes Historical Provider, MD  albuterol (PROVENTIL) (2.5 MG/3ML) 0.083% nebulizer solution Take 2.5 mg by nebulization every 6 (six) hours as needed. For shortness of breath.   Yes Historical Provider, MD  atorvastatin (LIPITOR) 40 MG tablet Take 40 mg by mouth daily.   Yes Historical Provider, MD  budesonide-formoterol (SYMBICORT) 160-4.5 MCG/ACT inhaler Inhale 2 puffs into the lungs 2 (two) times daily.   Yes Historical Provider, MD  Calcium Carbonate-Vitamin D (CALCIUM + D PO) Take 3 tablets by mouth daily.   Yes Historical Provider, MD  cetirizine (ZYRTEC) 10 MG tablet Take 10 mg by mouth daily.   Yes Historical Provider, MD  Cholecalciferol (VITAMIN D) 2000 UNITS tablet Take 2,000 Units by mouth daily.   Yes Historical Provider, MD  Cyanocobalamin (VITAMIN B-12) 2500 MCG SUBL Place 1 tablet under the tongue daily.   Yes Historical Provider, MD  fish oil-omega-3 fatty acids 1000 MG capsule Take 2 g by mouth daily.   Yes Historical Provider, MD  gabapentin (NEURONTIN) 100 MG capsule Take 200-300 mg by mouth 2 (two) times daily. 2 in am, 3 in pm   Yes Historical Provider, MD  Magnesium 250 MG TABS Take 1 tablet by mouth 2 (two) times daily.   Yes Historical Provider, MD  tiotropium (SPIRIVA) 18 MCG inhalation capsule Place 18 mcg into inhaler and inhale daily.   Yes Historical Provider, MD     Allergies:  No Known Allergies  Social History:   reports that she has been smoking.  She has never used smokeless tobacco. She reports that she does not drink alcohol  or use illicit drugs.  Family History: History reviewed. No pertinent family history.   Physical Exam: Filed Vitals:   06/29/11 1431 06/29/11 1636 06/29/11 1824  BP: 102/50 129/61   Pulse: 107 114   Temp: 99.8 F (37.7 C) 99.3 F (37.4 C) 102.5 F (39.2 C)  TempSrc: Oral Oral Rectal  Resp: 23 23   SpO2: 92% 96%      Blood pressure 129/61, pulse 114, temperature 102.5 F (39.2 C), temperature source Rectal, resp. rate 23, SpO2 96.00%.  GEN:  Pleasant person lying in the stretcher in no acute distress; cooperative with exam. She appears tachypneic and dyspneic . PSYCH:  alert and oriented x4; does not appear anxious does not appear depressed; affect is normal HEENT: Mucous membranes pink and anicteric; PERRLA; EOM intact; no cervical lymphadenopathy nor thyromegaly or carotid bruit; no JVD; Breasts:: Not examined CHEST WALL: No tenderness CHEST: Decreased breath sounds throughout but no significant wheezes or crackles HEART: Regular rate and rhythm; no murmurs rubs or gallops BACK: No kyphosis or scoliosis; no CVA tenderness ABDOMEN: Obese, soft non-tender; no masses, no organomegaly, normal abdominal bowel sounds; no pannus; no intertriginous candida. Rectal Exam: Not done EXTREMITIES: No bone or joint deformity; age-appropriate arthropathy of the hands and knees; no edema; no ulcerations. Genitalia: not examined PULSES: 2+ and symmetric SKIN: Normal hydration no rash or ulceration CNS: Cranial nerves 2-12 grossly intact no focal neurologic deficit   Labs & Imaging Results for orders placed during the hospital encounter of 06/29/11 (from the past 48 hour(s))  BASIC METABOLIC PANEL     Status: Abnormal   Collection Time   06/29/11  3:30 PM      Component Value Range Comment   Sodium 136  135 - 145 (mEq/L)    Potassium 3.6  3.5 - 5.1 (mEq/L)    Chloride 97  96 - 112 (mEq/L)    CO2 31  19 - 32 (mEq/L)    Glucose, Bld 116 (*) 70 - 99 (mg/dL)    BUN 13  6 - 23 (mg/dL)    Creatinine, Ser 1.61  0.50 - 1.10 (mg/dL)    Calcium 9.0  8.4 - 10.5 (mg/dL)    GFR calc non Af Amer 88 (*) >90 (mL/min)    GFR calc Af Amer >90  >90 (mL/min)   CBC     Status: Abnormal   Collection Time   06/29/11  3:30 PM      Component Value Range Comment   WBC 23.4 (*) 4.0 - 10.5 (K/uL)    RBC 4.71  3.87 - 5.11 (MIL/uL)     Hemoglobin 14.1  12.0 - 15.0 (g/dL)    HCT 09.6  04.5 - 40.9 (%)    MCV 90.9  78.0 - 100.0 (fL)    MCH 29.9  26.0 - 34.0 (pg)    MCHC 32.9  30.0 - 36.0 (g/dL)    RDW 81.1  91.4 - 78.2 (%)    Platelets 339  150 - 400 (K/uL)   DIFFERENTIAL     Status: Abnormal   Collection Time   06/29/11  3:30 PM      Component Value Range Comment   Neutrophils Relative 88 (*) 43 - 77 (%)    Neutro Abs 20.6 (*) 1.7 - 7.7 (K/uL)    Lymphocytes Relative 6 (*) 12 - 46 (%)    Lymphs Abs 1.5  0.7 - 4.0 (K/uL)    Monocytes Relative 6  3 - 12 (%)  Monocytes Absolute 1.3 (*) 0.1 - 1.0 (K/uL)    Eosinophils Relative 0  0 - 5 (%)    Eosinophils Absolute 0.0  0.0 - 0.7 (K/uL)    Basophils Relative 0  0 - 1 (%)    Basophils Absolute 0.0  0.0 - 0.1 (K/uL)   POCT I-STAT TROPONIN I     Status: Normal   Collection Time   06/29/11  3:42 PM      Component Value Range Comment   Troponin i, poc 0.00  0.00 - 0.08 (ng/mL)    Comment 3            BLOOD GAS, ARTERIAL     Status: Abnormal   Collection Time   06/29/11  3:48 PM      Component Value Range Comment   O2 Content 3.0      Delivery systems NASAL CANNULA      pH, Arterial 7.436 (*) 7.350 - 7.400     pCO2 arterial 44.5  35.0 - 45.0 (mmHg)    pO2, Arterial 62.2 (*) 80.0 - 100.0 (mmHg)    Bicarbonate 29.5 (*) 20.0 - 24.0 (mEq/L)    TCO2 25.7  0 - 100 (mmol/L)    Acid-Base Excess 5.0 (*) 0.0 - 2.0 (mmol/L)    O2 Saturation 93.0      Patient temperature 98.6      Collection site RIGHT RADIAL      Drawn by [id number]      Sample type ARTERIAL DRAW      Allens test (pass/fail) PASS  PASS     Dg Chest 2 View  06/29/2011  *RADIOLOGY REPORT*  Clinical Data: Shortness of breath.  Congestion and cough.  CHEST - 2 VIEW  Comparison: 10/12/2007.  Findings: The lungs are clear without focal infiltrate, edema, pneumothorax or pleural effusion. Interstitial markings are diffusely coarsened with chronic features. Right apical scarring is stable.  The cardiopericardial silhouette is  within normal limits for size. Imaged bony structures of the thorax are intact. Telemetry leads overlie the chest.  IMPRESSION: No acute cardiopulmonary process.  Original Report Authenticated By: ERIC A. MANSELL, M.D.      Assessment Present on Admission:  .Tracheobronchitis, acute or subacute, with bronchospasm or obstruction .COPD UNSPECIFIED .ACID REFLUX DISEASE .HYPERLIPIDEMIA  PLAN: COPD exacerbation with likely tracheobronchitis and an active smoker on home O2. She will require IV antibiotic and will continue Rocephin and Zithromax. Will hold off on any steroid. Because of the tachycardia, I will use Xopenex has a nebulizer treatment of choice. We'll rule out with serial CPKs and troponins although it is less likely that she would have had a cardiac event. I will continue her other medications. She is stable, full code, and will be admitted to [facility] hospitalist team 2. Obviously, I encouraged her strongly to stop smoking however, she indicated that it  is highly unlikely that she will quit.   Other plans as per orders.    Charm Stenner 06/29/2011, 8:25 PM

## 2011-06-29 NOTE — ED Provider Notes (Signed)
History     CSN: [id number]  Arrival date & time 06/29/11  1305   First MD Initiated Contact with Patient 06/29/11 1438      Chief Complaint  Patient presents with  . Shortness of Breath    sats in triage 78% on RA.  took O2 off in car when she arrived.    . Chest Pain    hurts when she breathes    (Consider location/radiation/quality/duration/timing/severity/associated sxs/prior treatment) HPI History provided by pt.   Pt has had dyspnea since yesterday.  Occurs at rest but aggravated by coughing and exertion.  Cough productive of clear phlegm and started 2 days ago.  Associated w/ non-radiating, substernal CP that is also aggravated by coughing, as well as N/V.  No known fever and denies abd pain, diarrhea, LE edema or unilateral pain.  Pt has h/o COPD.  Uses 2L home O2 at night and when she is active.  Continues to smoke 6-8 cigarettes/day.  Per prior chart, most recent admission for exacerbation in 2009.  No RF for PE.  No FH MI.    Past Medical History  Diagnosis Date  . COPD (chronic obstructive pulmonary disease)   . Asthma   . GERD (gastroesophageal reflux disease)   . PONV (postoperative nausea and vomiting)     Past Surgical History  Procedure Date  . Abdominal hysterectomy   . Tonsillectomy   . Appendectomy   . Back surgery   . Gallbladder surgery     History reviewed. No pertinent family history.  History  Substance Use Topics  . Smoking status: Not on file  . Smokeless tobacco: Not on file  . Alcohol Use: Not on file    OB History    No data available      Review of Systems  All other systems reviewed and are negative.    Allergies  Review of patient's allergies indicates no known allergies.  Home Medications   Current Outpatient Rx  Name Route Sig Dispense Refill  . ALBUTEROL SULFATE HFA 108 (90 BASE) MCG/ACT IN AERS Inhalation Inhale 2 puffs into the lungs every 6 (six) hours as needed. For shortness of breath.    . ALBUTEROL SULFATE (2.5  MG/3ML) 0.083% IN NEBU Nebulization Take 2.5 mg by nebulization every 6 (six) hours as needed. For shortness of breath.    . ATORVASTATIN CALCIUM 40 MG PO TABS Oral Take 40 mg by mouth daily.    . BUDESONIDE-FORMOTEROL FUMARATE 160-4.5 MCG/ACT IN AERO Inhalation Inhale 2 puffs into the lungs 2 (two) times daily.    Marland Kitchen CALCIUM + D PO Oral Take 3 tablets by mouth daily.    Marland Kitchen CETIRIZINE HCL 10 MG PO TABS Oral Take 10 mg by mouth daily.    Marland Kitchen VITAMIN D 2000 UNITS PO TABS Oral Take 2,000 Units by mouth daily.    Marland Kitchen VITAMIN B-12 2500 MCG SL SUBL Sublingual Place 1 tablet under the tongue daily.    . OMEGA-3 FATTY ACIDS 1000 MG PO CAPS Oral Take 2 g by mouth daily.    Marland Kitchen GABAPENTIN 100 MG PO CAPS Oral Take 200-300 mg by mouth 2 (two) times daily. 2 in am, 3 in pm    . MAGNESIUM 250 MG PO TABS Oral Take 1 tablet by mouth 2 (two) times daily.    Marland Kitchen TIOTROPIUM BROMIDE MONOHYDRATE 18 MCG IN CAPS Inhalation Place 18 mcg into inhaler and inhale daily.      BP 102/50  Pulse 107  Temp(Src) 99.8  F (37.7 C) (Oral)  Resp 23  SpO2 92%  Physical Exam  Nursing note and vitals reviewed. Constitutional: She is oriented to person, place, and time. She appears well-developed and well-nourished. No distress.  HENT:  Head: Normocephalic and atraumatic.  Eyes:       Normal appearance  Neck: Normal range of motion.  Cardiovascular: Regular rhythm and intact distal pulses.        tachycardia  Pulmonary/Chest: Effort normal and breath sounds normal. She exhibits no tenderness.       Mild respiratory distress.  Broken sentences.  O2 sat 89-90% on 3L.    Abdominal: Soft. Bowel sounds are normal. She exhibits no distension. There is no tenderness. There is no guarding.  Musculoskeletal:       No peripheral edema or calf tenderness  Neurological: She is alert and oriented to person, place, and time.  Skin: Skin is warm and dry. No rash noted.  Psychiatric: She has a normal mood and affect. Her behavior is normal.     ED Course  Procedures (including critical care time)   Date: 06/29/2011  Rate: 74  Rhythm: normal sinus rhythm  QRS Axis: normal  Intervals: normal  ST/T Wave abnormalities: normal  Conduction Disutrbances:borderline R wave progression  Narrative Interpretation:   Old EKG Reviewed: unchanged   Labs Reviewed  BASIC METABOLIC PANEL - Abnormal; Notable for the following:    Glucose, Bld 116 (*)    GFR calc non Af Amer 88 (*)    All other components within normal limits  CBC - Abnormal; Notable for the following:    WBC 23.4 (*)    All other components within normal limits  DIFFERENTIAL - Abnormal; Notable for the following:    Neutrophils Relative 88 (*)    Neutro Abs 20.6 (*)    Lymphocytes Relative 6 (*)    Monocytes Absolute 1.3 (*)    All other components within normal limits  BLOOD GAS, ARTERIAL - Abnormal; Notable for the following:    pH, Arterial 7.436 (*)    pO2, Arterial 62.2 (*)    Bicarbonate 29.5 (*)    Acid-Base Excess 5.0 (*)    All other components within normal limits  POCT I-STAT TROPONIN I   Dg Chest 2 View  06/29/2011  *RADIOLOGY REPORT*  Clinical Data: Shortness of breath.  Congestion and cough.  CHEST - 2 VIEW  Comparison: 10/12/2007.  Findings: The lungs are clear without focal infiltrate, edema, pneumothorax or pleural effusion. Interstitial markings are diffusely coarsened with chronic features. Right apical scarring is stable.  The cardiopericardial silhouette is within normal limits for size. Imaged bony structures of the thorax are intact. Telemetry leads overlie the chest.  IMPRESSION: No acute cardiopulmonary process.  Original Report Authenticated By: ERIC A. MANSELL, M.D.     1. COPD (chronic obstructive pulmonary disease)   2. CAP (community acquired pneumonia)       MDM  75yo F w/ h/o COPD presents w/ c/o dyspnea, chest pain and productive cough. Is on 2L home O2 at night and during exertional activity.  No RF for PE.  On exam she is  febrile, mild respiratory distress, hypoxia w/ O2 sat 89-90% on 3L, tachycardia, tachypnea, lungs CTA.  Pt received a breathing tmnt and O2 sat improved to 97-98% but dropped to 89% immediately following ambulation.  Despite neg CXR, pt clinically has CAP.  Labs sig for WBC 23,000, pO2 of 62.2 and neg troponin.  All results discussed w/ pt.  Rocephin, zithromax and acetaminophen ordered.  [facility] consulted for admission.         Arie Sabina [place], [place] 06/29/11 1940

## 2011-06-30 LAB — BASIC METABOLIC PANEL
BUN: 12 mg/dL (ref 6–23)
CO2: 34 mEq/L — ABNORMAL HIGH (ref 19–32)
Calcium: 8.9 mg/dL (ref 8.4–10.5)
Chloride: 99 mEq/L (ref 96–112)
Creatinine, Ser: 0.58 mg/dL (ref 0.50–1.10)

## 2011-06-30 LAB — TSH: TSH: 0.603 u[IU]/mL (ref 0.350–4.500)

## 2011-06-30 LAB — CARDIAC PANEL(CRET KIN+CKTOT+MB+TROPI)
Relative Index: 1.7 (ref 0.0–2.5)
Total CK: 175 U/L (ref 7–177)
Troponin I: <0.3 ng/mL (ref <0.30)
Troponin I: <0.3 ng/mL (ref <0.30)
Troponin I: <0.3 ng/mL (ref <0.30)

## 2011-06-30 LAB — CBC
HCT: 41.2 % (ref 36.0–46.0)
Hemoglobin: 13.5 g/dL (ref 12.0–15.0)
MCH: 29.9 pg (ref 26.0–34.0)
MCHC: 32.8 g/dL (ref 30.0–36.0)
MCV: 91.4 fL (ref 78.0–100.0)
RBC: 4.51 MIL/uL (ref 3.87–5.11)

## 2011-06-30 LAB — CREATININE, SERUM: GFR calc Af Amer: >90 mL/min (ref >90)

## 2011-06-30 NOTE — Progress Notes (Signed)
Subjective: Pt admitted for copd exacerbation.  Mentions that she feels better today.  Objective: Filed Vitals:   06/30/11 0550 06/30/11 0855 06/30/11 1423 06/30/11 2011  BP: 103/60  100/55   Pulse: 85  87   Temp: 97.7 F (36.5 C)  98.5 F (36.9 C)   TempSrc: Oral  Oral   Resp: 16  19   Height:      Weight:      SpO2: 93% 94% 94% 95%   Weight change:   Intake/Output Summary (Last 24 hours) at 06/30/11 2031 Last data filed at 06/30/11 1850  Gross per 24 hour  Intake    720 ml  Output      0 ml  Net    720 ml    General: Alert, awake, oriented x3, in no acute distress. HEENT: No bruits, no goiter.  Heart: Regular rate and rhythm, without murmurs, rubs, gallops.  Lungs: rhales bases> apices, prolonged exp phase Abdomen: Soft, nontender, nondistended, positive bowel sounds.  Neuro: Grossly intact, nonfocal.   Lab Results:  Basename 06/30/11 0548 06/29/11 2323 06/29/11 1530  NA 138 -- 136  K 4.5 -- 3.6  CL 99 -- 97  CO2 34* -- 31  GLUCOSE 141* -- 116*  BUN 12 -- 13  CREATININE 0.58 0.69 --  CALCIUM 8.9 -- 9.0  MG -- -- --  PHOS -- -- --   No results found for this basename: AST:2,ALT:2,ALKPHOS:2,BILITOT:2,PROT:2,ALBUMIN:2 in the last 72 hours No results found for this basename: LIPASE:2,AMYLASE:2 in the last 72 hours  Basename 06/30/11 0548 06/29/11 2323 06/29/11 1530  WBC 18.8* 19.0* --  NEUTROABS -- -- 20.6*  HGB 13.5 12.1 --  HCT 41.2 38.1 --  MCV 91.4 91.1 --  PLT 284 293 --    Basename 06/30/11 1455 06/30/11 0548 06/29/11 2323  CKTOTAL 152 175 132  CKMB 2.8 3.3 2.3  CKMBINDEX -- -- --  TROPONINI <0.30 <0.30 <0.30   No components found with this basename: POCBNP:3 No results found for this basename: DDIMER:2 in the last 72 hours No results found for this basename: HGBA1C:2 in the last 72 hours No results found for this basename: CHOL:2,HDL:2,LDLCALC:2,TRIG:2,CHOLHDL:2,LDLDIRECT:2 in the last 72 hours  Basename 06/29/11 2323  TSH 0.603  T4TOTAL  --  T3FREE --  THYROIDAB --   No results found for this basename: VITAMINB12:2,FOLATE:2,FERRITIN:2,TIBC:2,IRON:2,RETICCTPCT:2 in the last 72 hours  Micro Results: No results found for this or any previous visit (from the past 240 hour(s)).  Studies/Results: Dg Chest 2 View  06/29/2011  *RADIOLOGY REPORT*  Clinical Data: Shortness of breath.  Congestion and cough.  CHEST - 2 VIEW  Comparison: 10/12/2007.  Findings: The lungs are clear without focal infiltrate, edema, pneumothorax or pleural effusion. Interstitial markings are diffusely coarsened with chronic features. Right apical scarring is stable.  The cardiopericardial silhouette is within normal limits for size. Imaged bony structures of the thorax are intact. Telemetry leads overlie the chest.  IMPRESSION: No acute cardiopulmonary process.  Original Report Authenticated By: ERIC A. MANSELL, M.D.    Medications: I have reviewed the patient's current medications.   Patient Active Hospital Problem List: COPD exacerbation:  Will continue current regimen and reassess condition tomorrow.  HYPERLIPIDEMIA (05/15/2007)  Stable continue current regimen.  ACID REFLUX DISEASE (05/15/2007) Stable no complaints today.     LOS: 1 day   Penny Pia M.D.  [facility] 06/30/2011, 8:31 PM

## 2011-06-30 NOTE — ED Provider Notes (Signed)
Medical screening examination/treatment/procedure(s) were performed by non-physician practitioner and as supervising physician I was immediately available for consultation/collaboration.   Andrick Rust, MD 06/30/11 0846 

## 2011-07-01 DIAGNOSIS — F17200 Nicotine dependence, unspecified, uncomplicated: Secondary | ICD-10-CM | POA: Diagnosis present

## 2011-07-01 LAB — CBC
HCT: 40.5 % (ref 36.0–46.0)
Hemoglobin: 13.1 g/dL (ref 12.0–15.0)
MCHC: 32.3 g/dL (ref 30.0–36.0)
MCV: 93.1 fL (ref 78.0–100.0)
WBC: 15 10*3/uL — ABNORMAL HIGH (ref 4.0–10.5)

## 2011-07-01 LAB — BASIC METABOLIC PANEL
BUN: 10 mg/dL (ref 6–23)
CO2: 33 mEq/L — ABNORMAL HIGH (ref 19–32)
Chloride: 99 mEq/L (ref 96–112)
GFR calc Af Amer: >90 mL/min (ref >90)
Potassium: 3.7 mEq/L (ref 3.5–5.1)

## 2011-07-01 MED ORDER — ENSURE IMMUNE HEALTH PO LIQD
237.0000 mL | Freq: Three times a day (TID) | ORAL | Status: DC
  Administered 2011-07-01 – 2011-07-02 (×4): 237 mL via ORAL
  Filled 2011-07-01 (×2): qty 237

## 2011-07-01 MED ORDER — HYDROCODONE-ACETAMINOPHEN 5-325 MG PO TABS
1.0000 | ORAL_TABLET | Freq: Four times a day (QID) | ORAL | Status: DC | PRN
  Administered 2011-07-02 (×2): 2 via ORAL
  Administered 2011-07-03: 1 via ORAL
  Administered 2011-07-03: 2 via ORAL
  Administered 2011-07-04: 1 via ORAL
  Filled 2011-07-01 (×3): qty 2
  Filled 2011-07-01 (×2): qty 1
  Filled 2011-07-01: qty 2

## 2011-07-01 MED ORDER — METHYLPREDNISOLONE SODIUM SUCC 40 MG IJ SOLR
40.0000 mg | Freq: Three times a day (TID) | INTRAMUSCULAR | Status: DC
  Administered 2011-07-01 – 2011-07-04 (×10): 40 mg via INTRAVENOUS
  Filled 2011-07-01 (×12): qty 1

## 2011-07-01 MED ORDER — OXYMETAZOLINE HCL 0.05 % NA SOLN
1.0000 | Freq: Two times a day (BID) | NASAL | Status: AC
  Administered 2011-07-02 – 2011-07-04 (×6): 1 via NASAL
  Filled 2011-07-01: qty 15

## 2011-07-01 MED ORDER — ZOLPIDEM TARTRATE 5 MG PO TABS
5.0000 mg | ORAL_TABLET | Freq: Every evening | ORAL | Status: DC | PRN
Start: 2011-07-01 — End: 2011-07-04
  Administered 2011-07-01 – 2011-07-03 (×3): 5 mg via ORAL
  Filled 2011-07-01 (×3): qty 1

## 2011-07-01 MED ORDER — NICOTINE 14 MG/24HR TD PT24
14.0000 mg | MEDICATED_PATCH | Freq: Every day | TRANSDERMAL | Status: DC
  Administered 2011-07-01 – 2011-07-04 (×4): 14 mg via TRANSDERMAL
  Filled 2011-07-01 (×4): qty 1

## 2011-07-01 NOTE — Progress Notes (Signed)
Subjective: At this point patient mentions that she feels about the same.  Has been having nicotine withdrawal and is requesting a nicotine patch.  Patient is also requesting a general diet.  Nursing reports that she has not eaten well on her cardiac diet.  No other acute issues reported.  Objective: Filed Vitals:   07/01/11 0612 07/01/11 0828 07/01/11 1421 07/01/11 1458  BP: 109/63  101/63   Pulse: 98  99   Temp: 98.2 F (36.8 C)  98.6 F (37 C)   TempSrc: Oral  Oral   Resp: 18  19   Height:      Weight:      SpO2: 95% 95% 92% 91%   Weight change:   Intake/Output Summary (Last 24 hours) at 07/01/11 1745 Last data filed at 07/01/11 1424  Gross per 24 hour  Intake    480 ml  Output      0 ml  Net    480 ml    General: Alert, awake, oriented x3, in no acute distress.  HEENT: No bruits, no goiter.  Heart: Regular rate and rhythm, without murmurs, rubs, gallops.  Lungs: rhales bases> apices, prolonged exp phase, wheezes diffusely Abdomen: Soft, nontender, nondistended, positive bowel sounds.  Neuro: Grossly intact, nonfocal.   Lab Results:  Basename 07/01/11 0453 06/30/11 0548  NA 138 138  K 3.7 4.5  CL 99 99  CO2 33* 34*  GLUCOSE 139* 141*  BUN 10 12  CREATININE 0.62 0.58  CALCIUM 8.5 8.9  MG -- --  PHOS -- --   No results found for this basename: AST:2,ALT:2,ALKPHOS:2,BILITOT:2,PROT:2,ALBUMIN:2 in the last 72 hours No results found for this basename: LIPASE:2,AMYLASE:2 in the last 72 hours  Basename 07/01/11 0453 06/30/11 0548 06/29/11 1530  WBC 15.0* 18.8* --  NEUTROABS -- -- 20.6*  HGB 13.1 13.5 --  HCT 40.5 41.2 --  MCV 93.1 91.4 --  PLT 305 284 --    Basename 06/30/11 1455 06/30/11 0548 06/29/11 2323  CKTOTAL 152 175 132  CKMB 2.8 3.3 2.3  CKMBINDEX -- -- --  TROPONINI <0.30 <0.30 <0.30   No components found with this basename: POCBNP:3 No results found for this basename: DDIMER:2 in the last 72 hours No results found for this basename: HGBA1C:2  in the last 72 hours No results found for this basename: CHOL:2,HDL:2,LDLCALC:2,TRIG:2,CHOLHDL:2,LDLDIRECT:2 in the last 72 hours  Basename 06/29/11 2323  TSH 0.603  T4TOTAL --  T3FREE --  THYROIDAB --   No results found for this basename: VITAMINB12:2,FOLATE:2,FERRITIN:2,TIBC:2,IRON:2,RETICCTPCT:2 in the last 72 hours  Micro Results: No results found for this or any previous visit (from the past 240 hour(s)).  Studies/Results: No results found.  Medications: I have reviewed the patient's current medications.   Patient Active Hospital Problem List: Patient Active Hospital Problem List:  COPD exacerbation: Will continue current regimen and will add today systemic steroids as patient has wheezes diffusely and has not seen much improvement in her condition since yesterday.  Will monitor vitals and follow WBC counts.  HYPERLIPIDEMIA (05/15/2007) Stable continue current regimen.   ACID REFLUX DISEASE (05/15/2007) Stable no complaints today.   Nicotine Dependence:  Will place patient on a nicotine patch today.    LOS: 2 days   Penny Pia M.D.  [facility] 07/01/2011, 5:45 PM

## 2011-07-01 NOTE — Progress Notes (Addendum)
INITIAL ADULT NUTRITION ASSESSMENT Date: 07/01/2011   Time: 12:28 PM  Reason for Assessment: Consult, dysphagia  ASSESSMENT: Female 76 y.o.  Dx: Tracheobronchitis, acute or subacute, with bronchospasm or obstruction  Hx:  Past Medical History  Diagnosis Date  . COPD (chronic obstructive pulmonary disease)   . Asthma   . GERD (gastroesophageal reflux disease)   . PONV (postoperative nausea and vomiting)    Related Meds:     . aspirin EC  81 mg Oral Daily  . atorvastatin  40 mg Oral Daily  . azithromycin  500 mg Intravenous QHS  . budesonide-formoterol  2 puff Inhalation BID  . calcium-vitamin D  1 tablet Oral BID  . cefTRIAXone (ROCEPHIN)  IV  1 g Intravenous QHS  . cholecalciferol  2,000 Units Oral Daily  . enoxaparin  40 mg Subcutaneous Q24H  . gabapentin  200 mg Oral Daily  . gabapentin  300 mg Oral QHS  . levalbuterol  0.63 mg Nebulization Q6H  . loratadine  10 mg Oral Daily  . magnesium oxide  400 mg Oral BID  . methylPREDNISolone (SOLU-MEDROL) injection  40 mg Intravenous TID  . omega-3 acid ethyl esters  2 g Oral Daily  . sodium chloride  3 mL Intravenous Q12H  . tiotropium  18 mcg Inhalation Daily   Ht: 5' (152.4 cm)  Wt: 165 lb 5.5 oz (75 kg)  Ideal Wt: 45.5 kg  % Ideal Wt: 165%  Usual Wt: Unknown  % Usual Wt: Unknown  Body mass index is 32.29 kg/(m^2), obese  Food/Nutrition Related Hx: Patient reports that she was eating well PTA and ate 100% of meals yesterday. She has not eaten anything today due to decreased appetite. She reports difficulty swallowing meats and breads, which get stuck in throat.   Labs:  CMP     Component Value Date/Time   NA 138 07/01/2011 0453   K 3.7 07/01/2011 0453   CL 99 07/01/2011 0453   CO2 33* 07/01/2011 0453   GLUCOSE 139* 07/01/2011 0453   BUN 10 07/01/2011 0453   CREATININE 0.62 07/01/2011 0453   CALCIUM 8.5 07/01/2011 0453   PROT 4.9* 10/14/2007 0610   ALBUMIN 2.5* 10/14/2007 0610   AST 16 10/14/2007 0610   ALT 20  10/14/2007 0610   ALKPHOS 45 10/14/2007 0610   BILITOT 0.4 10/14/2007 0610   GFRNONAA 87* 07/01/2011 0453   GFRAA >90 07/01/2011 0453    Intake/Output Summary (Last 24 hours) at 07/01/11 1231 Last data filed at 07/01/11 0900  Gross per 24 hour  Intake    480 ml  Output      0 ml  Net    480 ml     Diet Order: Heart healthy, 0-100%  Supplements/Tube Feeding: None  IVF:  None  Estimated Nutritional Needs:   Kcal: 1600-1750 kcal Protein: 75-90 g   Fluid: 2.2 L  NUTRITION DIAGNOSIS: -Inadequate oral intake (NI-2.1).  Status: Ongoing  RELATED TO: swallowing difficulty  AS EVIDENCE BY: 0% meal completion today  MONITORING/EVALUATION(Goals): Patient will meet 90-100% of estimated nutrition needs.   Monitor: PO intake, weight trend, labs  EDUCATION NEEDS: -Education needs addressed  INTERVENTION: 1. Patient instructed to choose soft foods that are easy to swallow.  2. Ensure shake TID between meals.   DOCUMENTATION CODES Per approved criteria  -Obesity Unspecified    Fabio Pierce 07/01/2011, 12:28 PM

## 2011-07-02 MED ORDER — ENSURE CLINICAL ST REVIGOR PO LIQD
237.0000 mL | Freq: Three times a day (TID) | ORAL | Status: DC
  Administered 2011-07-02: 19:00:00 via ORAL
  Administered 2011-07-03 (×3): 237 mL via ORAL
  Administered 2011-07-04 (×2): via ORAL

## 2011-07-02 NOTE — Clinical Documentation Improvement (Signed)
RESPIRATORY FAILURE DOCUMENTATION CLARIFICATION QUERY   THIS DOCUMENT IS NOT A PERMANENT PART OF THE MEDICAL RECORD  TO RESPOND TO THE THIS QUERY, FOLLOW THE INSTRUCTIONS BELOW:  1. If needed, update documentation for the patient's encounter via the notes activity.  2. Access this query again and click edit on the In [employer].  3. After updating, or not, click F2 to complete all highlighted (required) fields concerning your review. Select "additional documentation in the medical record" OR "no additional documentation provided".  4. Click Sign note button.  5. The deficiency will fall out of your In Basket *Please let us know if you are not able to complete this workflow by phone or e-mail (listed below).  Please update your documentation within the medical record to reflect your response to this query.                                                                                    07/02/11  Dear Dr.[facility],  In a better effort to capture your patient's severity of illness, reflect appropriate length of stay and utilization of resources, a review of the patient medical record has revealed the following indicators.    Based on your clinical judgment, please clarify and document in a progress note and/or discharge summary the clinical condition associated with the following supporting information:  In responding to this query please exercise your independent judgment.  The fact that a query is asked, does not imply that any particular answer is desired or expected.  06/29/11 H/P..."history of emphysema and unfortunately a continued active smoker, GERD, on home O2...". For accurate Dx specificity & severity can noted "home O2" be further linked/clarified w/ possible, probable , suspected or likely condition being monitored/evaluated & tx'd. Thank you  Possible Clinical Conditions? -Acute Respiratory Failure -Acute on Chronic Respiratory Failure -Chronic Respiratory  Failure -Acute Respiratory Insufficiency -Other Condition -Cannot Clinically Determine    Supporting Information:               Risk Factors: see above note  Signs&Symptoms: 06/29/11 h&p..."presents to [facility] long emergency room the recommendation of her PCP because of the thick yellow sputum productive cough and increased shortness of breath..."  Diagnostics: ABG's:  06/29/11 3:48 PM  O2 Content 3.0: NASAL CANNULA  pH, Arterial 7.436 (*)  pCO2 arterial 44.5  pO2, Arterial 62.2 (*)  Bicarbonate 29.5 (*)  TCO2 25.7  Acid-Base Excess 5.0 (*)  O2 Saturation 93.0   Radiology:  Treatment: 07/01/11: O2 support; nebs; steriods, conts pulse Ox, nicotine patch    You may use possible, probable, or suspect with inpatient documentation. possible, probable, suspected diagnoses MUST be documented at the time of discharge  Reviewed: additional documentation in the medical record  Thank You,  Toribio Harbour, RN, BSN, CCDS Certified Clinical Documentation Specialist Pager: [phone]  Health Information Management [place]

## 2011-07-02 NOTE — Evaluation (Signed)
Physical Therapy Evaluation [name] Details Name: [name] MRN: [id number] DOB: 1936-02-09 Today's Date: 07/02/2011  Problem List:  [name] Active Problem List  Diagnoses  . HYPERLIPIDEMIA  . COPD UNSPECIFIED  . ACID REFLUX DISEASE  . DYSPNEA  . Tracheobronchitis, acute or subacute, with bronchospasm or obstruction  . Nicotine dependence    Past Medical History:  Past Medical History  Diagnosis Date  . COPD (chronic obstructive pulmonary disease)   . Asthma   . GERD (gastroesophageal reflux disease)   . PONV (postoperative nausea and vomiting)    Past Surgical History:  Past Surgical History  Procedure Date  . Abdominal hysterectomy   . Tonsillectomy   . Appendectomy   . Back surgery   . Gallbladder surgery     PT Assessment/Plan/Recommendation PT Assessment Clinical Impression Statement: Pt presents with tracheobronchitis and COPD exacerbation with decreased mobility.  PT checked orders, which state bed rest with bathroom priveledges, therefore remained in room (could not locate RN during session).  Pt tolerated ambulation in room with RW, however becomes SOB very easily and requires frequent rest breaks, she is also somewhat unsteady, but noted improvement with RW.  Pt spoke with RN student regarding pts status and requested that pt be able to ambulate in hall with assistance.  Pt will benefit from skilled PT in acute venue in order to address endurance and balance issues.  PT recommends no follow up therapy for pt at D/C.  Did discuss pt perhaps getting Life Alert in case of emergency at home.  Pt agreed to think about it.  PT Recommendation/Assessment: [name] will need skilled PT in the acute care venue PT Problem List: Decreased activity tolerance;Decreased balance Barriers to Discharge: Decreased caregiver support PT Therapy Diagnosis : Difficulty walking;Generalized weakness PT Plan PT Frequency: Min 3X/week PT Treatment/Interventions: DME instruction;Gait  training;Stair training;Balance training PT Recommendation Follow Up Recommendations: No PT follow up Equipment Recommended: None recommended by PT PT Goals  Acute Rehab PT Goals PT Goal Formulation: With [name] Time For Goal Achievement: 2 weeks Pt will Ambulate: 51 - 150 feet;with modified independence;with least restrictive assistive device PT Goal: Ambulate - Progress: Goal set today Pt will Go Up / Down Stairs: 3-5 stairs;with modified independence;with least restrictive assistive device PT Goal: Up/Down Stairs - Progress: Goal set today Pt will Perform Home Exercise Program: Independently PT Goal: Perform Home Exercise Program - Progress: Goal set today  PT Evaluation Precautions/Restrictions    Prior Functioning  Home Living Lives With: Alone Receives Help From:  (states that sister is 40 mins away.) Type of Home: House Home Layout: One level Home Access: Stairs to enter Entrance Stairs-Rails: None Entrance Stairs-Number of Steps: 3 Home Adaptive Equipment: Walker - rolling;Shower chair with back;Bedside commode/3-in-1 Prior Function Level of Independence: Independent with basic ADLs;Independent with gait;Independent with transfers Driving: Yes Vocation: Part time employment Cognition Cognition Arousal/Alertness: Awake/alert Overall Cognitive Status: Appears within functional limits for tasks assessed Sensation/Coordination Sensation Light Touch: Appears Intact Coordination Gross Motor Movements are Fluid and Coordinated: Yes Extremity Assessment RLE Assessment RLE Assessment: Exceptions to [facility] RLE Strength RLE Overall Strength Comments: Ankle DF/PF 3+/5 , knee flex/ext 4/5, hip flex 3/5 LLE Assessment LLE Assessment: Exceptions to [facility] LLE Strength LLE Overall Strength Comments: Ankle PF/DF 4/5, knee flex/ext 4/5, hip flex 3/5 Mobility (including Balance) Bed Mobility Bed Mobility: Yes Supine to Sit: 6: Modified independent (Device/Increase time) Sit to  Supine: 6: Modified independent (Device/Increase time) Transfers Transfers: Yes Sit to Stand: 5: Supervision Sit to  Stand Details (indicate cue type and reason): Supervision for safety Stand to Sit: 6: Modified independent (Device/Increase time) Ambulation/Gait Ambulation/Gait: Yes Ambulation/Gait Assistance: 5: Supervision Ambulation/Gait Assistance Details (indicate cue type and reason): Supervision for safety with cues for pursed lip breathing.  Ambulation Distance (Feet): 30 Feet Assistive device: Rolling walker Gait Pattern: Within Functional Limits Gait velocity: WFL Stairs: No    Exercise    End of Session PT - End of Session Activity Tolerance: Treatment limited secondary to medical complications (Comment) [name] left: in bed;with call bell in reach Nurse Communication: Mobility status for transfers;Mobility status for ambulation General Behavior During Session: [facility] for tasks performed Cognition: [facility] for tasks performed  Page, Meribeth Mattes 07/02/2011, 3:41 PM

## 2011-07-02 NOTE — Progress Notes (Signed)
Subjective: Pt feels much improved currently.  The nicotine patch and the systemic steroids have helped her she mentions.  She is still having a lot of cough.  I discussed tobacco cessation with patient.  Otherwise no acute complaints.  Objective: Filed Vitals:   07/02/11 0713 07/02/11 0853 07/02/11 1358 07/02/11 1533  BP: 107/68   126/68  Pulse: 66   91  Temp: 97.6 F (36.4 C)   97.8 F (36.6 C)  TempSrc: Oral     Resp: 19   18  Height:      Weight:      SpO2: 95% 92% 95% 91%   Weight change:   Intake/Output Summary (Last 24 hours) at 07/02/11 1725 Last data filed at 07/02/11 1542  Gross per 24 hour  Intake    703 ml  Output      0 ml  Net    703 ml    General: Alert, awake, oriented x3, in no acute distress.  HEENT: No bruits, no goiter.  Heart: regular rate and rhythm, no murmurs.  Lungs:  prolonged expiratory phase, with mild expiratory wheezes diffusely Abdomen: Soft, nontender, nondistended, positive bowel sounds.  Neuro: Grossly intact, nonfocal.   Lab Results:  Basename 07/01/11 0453 06/30/11 0548  NA 138 138  K 3.7 4.5  CL 99 99  CO2 33* 34*  GLUCOSE 139* 141*  BUN 10 12  CREATININE 0.62 0.58  CALCIUM 8.5 8.9  MG -- --  PHOS -- --   No results found for this basename: AST:2,ALT:2,ALKPHOS:2,BILITOT:2,PROT:2,ALBUMIN:2 in the last 72 hours No results found for this basename: LIPASE:2,AMYLASE:2 in the last 72 hours  Basename 07/01/11 0453 06/30/11 0548  WBC 15.0* 18.8*  NEUTROABS -- --  HGB 13.1 13.5  HCT 40.5 41.2  MCV 93.1 91.4  PLT 305 284    Basename 06/30/11 1455 06/30/11 0548 06/29/11 2323  CKTOTAL 152 175 132  CKMB 2.8 3.3 2.3  CKMBINDEX -- -- --  TROPONINI <0.30 <0.30 <0.30   No components found with this basename: POCBNP:3 No results found for this basename: DDIMER:2 in the last 72 hours No results found for this basename: HGBA1C:2 in the last 72 hours No results found for this basename:  CHOL:2,HDL:2,LDLCALC:2,TRIG:2,CHOLHDL:2,LDLDIRECT:2 in the last 72 hours  Basename 06/29/11 2323  TSH 0.603  T4TOTAL --  T3FREE --  THYROIDAB --   No results found for this basename: VITAMINB12:2,FOLATE:2,FERRITIN:2,TIBC:2,IRON:2,RETICCTPCT:2 in the last 72 hours  Micro Results: No results found for this or any previous visit (from the past 240 hour(s)).  Studies/Results: No results found.  Medications: I have reviewed the patient's current medications.   Patient Active Hospital Problem List:  COPD with acute exacerbation: Will continue current regimen. Will adjust systemic steroids tomorrow pending clinical condition.  Patient is on oxygen at home and is on 2 Liters.  Condition is currently improved and patient is currently on 3 L supplemental oxygen. Will monitor vitals and follow WBC counts.    HYPERLIPIDEMIA (05/15/2007) Stable continue current regimen.   ACID REFLUX DISEASE (05/15/2007) Stable no complaints today.   Nicotine Dependence: Will continue a nicotine patch as patient is tolerating well.  Also have discussed tobacco cessation with patient.  Disposition:  Pending clinical improvement of her current breathing status.  She is getting closer to baseline.  Will reassess for tomorrow.    LOS: 3 days   Penny Pia M.D.  [facility] 07/02/2011, 5:25 PM

## 2011-07-03 DIAGNOSIS — K5900 Constipation, unspecified: Secondary | ICD-10-CM

## 2011-07-03 LAB — CBC
Hemoglobin: 12.6 g/dL (ref 12.0–15.0)
MCH: 30.3 pg (ref 26.0–34.0)
MCV: 92.1 fL (ref 78.0–100.0)
RBC: 4.16 MIL/uL (ref 3.87–5.11)

## 2011-07-03 LAB — BASIC METABOLIC PANEL
CO2: 39 mEq/L — ABNORMAL HIGH (ref 19–32)
Calcium: 9.4 mg/dL (ref 8.4–10.5)
Creatinine, Ser: 0.49 mg/dL — ABNORMAL LOW (ref 0.50–1.10)
Glucose, Bld: 157 mg/dL — ABNORMAL HIGH (ref 70–99)

## 2011-07-03 MED ORDER — DOCUSATE SODIUM 100 MG PO CAPS
100.0000 mg | ORAL_CAPSULE | Freq: Two times a day (BID) | ORAL | Status: DC
  Administered 2011-07-03 – 2011-07-04 (×3): 100 mg via ORAL
  Filled 2011-07-03 (×4): qty 1

## 2011-07-03 MED ORDER — BISACODYL 5 MG PO TBEC
5.0000 mg | DELAYED_RELEASE_TABLET | Freq: Every day | ORAL | Status: DC | PRN
  Administered 2011-07-03 – 2011-07-04 (×2): 5 mg via ORAL
  Filled 2011-07-03 (×2): qty 1

## 2011-07-03 MED ORDER — LEVOFLOXACIN 500 MG PO TABS
500.0000 mg | ORAL_TABLET | Freq: Every day | ORAL | Status: DC
  Administered 2011-07-03 – 2011-07-04 (×2): 500 mg via ORAL
  Filled 2011-07-03 (×2): qty 1

## 2011-07-03 NOTE — Progress Notes (Signed)
Brief history:  [name] is an 76 y.o. female with history of emphysema and unfortunately a continued active smoker, GERD, on home O2, presents to [facility] long emergency with COPD exacerbation.  Initially was treated with IV antibiotics, continued home COPD regimen, and nebulizers.  I added systemic steroids.  Patient has been slow to improve but every day feels a little better.  Subjective: Patient mentions that she still doesn't feel like she is quite at baseline.  Reports that she is still coughing a lot and has been productive of yellow sputum.  Denies any fever or chills.  But has dyspnea on exertion.  No acute issues overnight and reports that nicotine patch has helped control her cravings.   Objective: Filed Vitals:   07/03/11 0542 07/03/11 0815 07/03/11 1320 07/03/11 1500  BP: 122/72  116/62   Pulse: 83  88   Temp: 97.8 F (36.6 C)  98.5 F (36.9 C)   TempSrc: Oral  Oral   Resp: 18  22   Height:      Weight:      SpO2: 94% 96% 96% 96%   Weight change:   Intake/Output Summary (Last 24 hours) at 07/03/11 1822 Last data filed at 07/02/11 2131  Gross per 24 hour  Intake    520 ml  Output      0 ml  Net    520 ml    General: Alert, awake, oriented x3, in no acute distress.  HEENT: No bruits, no goiter.  Heart: regular rate and rhythm, no murmurs.  Lungs: prolonged expiratory phase, with mild expiratory wheezes diffusely  Abdomen: Soft, nontender, nondistended, positive bowel sounds.  Neuro: Grossly intact, nonfocal.   Lab Results:  Basename 07/03/11 0448 07/01/11 0453  NA 137 138  K 4.6 3.7  CL 93* 99  CO2 39* 33*  GLUCOSE 157* 139*  BUN 15 10  CREATININE 0.49* 0.62  CALCIUM 9.4 8.5  MG -- --  PHOS -- --   No results found for this basename: AST:2,ALT:2,ALKPHOS:2,BILITOT:2,PROT:2,ALBUMIN:2 in the last 72 hours No results found for this basename: LIPASE:2,AMYLASE:2 in the last 72 hours  Basename 07/03/11 0448 07/01/11 0453  WBC 15.9* 15.0*  NEUTROABS  -- --  HGB 12.6 13.1  HCT 38.3 40.5  MCV 92.1 93.1  PLT 337 305   No results found for this basename: CKTOTAL:3,CKMB:3,CKMBINDEX:3,TROPONINI:3 in the last 72 hours No components found with this basename: POCBNP:3 No results found for this basename: DDIMER:2 in the last 72 hours No results found for this basename: HGBA1C:2 in the last 72 hours No results found for this basename: CHOL:2,HDL:2,LDLCALC:2,TRIG:2,CHOLHDL:2,LDLDIRECT:2 in the last 72 hours No results found for this basename: TSH,T4TOTAL,FREET3,T3FREE,THYROIDAB in the last 72 hours No results found for this basename: VITAMINB12:2,FOLATE:2,FERRITIN:2,TIBC:2,IRON:2,RETICCTPCT:2 in the last 72 hours  Micro Results: No results found for this or any previous visit (from the past 240 hour(s)).  Studies/Results: No results found.  Medications: I have reviewed the patient's current medications.   Patient Active Hospital Problem List: COPD with acute exacerbation (06/29/2011) Will switch antibiotic regimen to po levaquin today reassess status tomorrow.  Otherwise will continue current regimen.  Will not decrease IV steroids as patient still having wheezes.  Constipation:  Will add dulcolax and colace and reassess tomorrow in the am.  HYPERLIPIDEMIA (05/15/2007) Stable continue current regimen.   ACID REFLUX DISEASE (05/15/2007) Stable no complaints today.   Nicotine Dependence: Will continue a nicotine patch as patient is tolerating well. Also have discussed tobacco cessation with patient.  Disposition: Pending clinical improvement of her current breathing status. She is getting closer to baseline. Will reassess for tomorrow.      LOS: 4 days   Penny Pia M.D.  [facility] 07/03/2011, 6:22 PM

## 2011-07-04 LAB — BASIC METABOLIC PANEL
BUN: 17 mg/dL (ref 6–23)
Calcium: 9.7 mg/dL (ref 8.4–10.5)
Creatinine, Ser: 0.53 mg/dL (ref 0.50–1.10)
GFR calc non Af Amer: >90 mL/min (ref >90)
Glucose, Bld: 167 mg/dL — ABNORMAL HIGH (ref 70–99)

## 2011-07-04 LAB — CBC
MCH: 29.6 pg (ref 26.0–34.0)
MCHC: 31.8 g/dL (ref 30.0–36.0)
Platelets: 406 10*3/uL — ABNORMAL HIGH (ref 150–400)

## 2011-07-04 MED ORDER — PREDNISONE 5 MG PO TABS: 5.0000 mg | ORAL_TABLET | Freq: Every day | ORAL | Status: DC

## 2011-07-04 MED ORDER — MAGNESIUM HYDROXIDE NICU ORAL SYRINGE 400 MG/5 ML: 30.0000 mL | Freq: Every day | ORAL | Status: DC

## 2011-07-04 MED ORDER — LEVOFLOXACIN 500 MG PO TABS: 500.0000 mg | ORAL_TABLET | Freq: Every day | ORAL | Status: DC

## 2011-07-04 MED ORDER — MAGNESIUM HYDROXIDE 400 MG/5ML PO SUSP: 30.0000 mL | Freq: Every day | ORAL | Status: DC

## 2011-07-04 MED ORDER — BISACODYL 5 MG PO TBEC
5.0000 mg | DELAYED_RELEASE_TABLET | Freq: Once | ORAL | Status: AC
  Administered 2011-07-04: 5 mg via ORAL
  Filled 2011-07-04: qty 1

## 2011-07-04 MED ORDER — BISACODYL 10 MG RE SUPP
10.0000 mg | Freq: Once | RECTAL | Status: AC
  Administered 2011-07-04: 10 mg via RECTAL
  Filled 2011-07-04: qty 1

## 2011-07-04 NOTE — Progress Notes (Signed)
07-04-11 Spoke with [name] at bedside. She pleasantly declines this CM setting up [facility] RN. States she has [name] that supplies her 31. Lives alone but is going to stay with her sister in [place] for a couple days. She has all the equipment she needs at home. She states she does not need anything. Wears home 02 at night, which she already has. PCP: Dr Uvaldo Rising.  [place], [place]  [phone]

## 2011-07-04 NOTE — Discharge Instructions (Signed)
Follow with Primary MD MCNEILL,WENDY, MD, MD in 3 days   Get CBC, CMP, checked 3 days by Primary MD and again as instructed by your Primary MD. Get a 2 view Chest X ray done next visit.  Get Medicines reviewed and adjusted.  Please request your Prim.MD to go over all Hospital Tests and Procedure/Radiological results at the follow up, please get all Hospital records sent to your Prim MD by signing hospital release before you go home.  Follow-up Information    Follow up with [facility], MD .          Activity: Fall precautions use walker/cane & assistance as needed  Diet: Cardiac, Aspiration precautions.  For Heart failure patients - Check your Weight same time everyday, if you gain over 2 pounds, or you develop in leg swelling, experience more shortness of breath or chest pain, call your Primary MD immediately. Follow Cardiac Low Salt Diet and 1.8 lit/day fluid restriction.  Disposition Home  If you experience worsening of your admission symptoms, develop shortness of breath, life threatening emergency, suicidal or homicidal thoughts you must seek medical attention immediately by calling 911 or calling your MD immediately  if symptoms less severe.  You Must read complete instructions/literature along with all the possible adverse reactions/side effects for all the Medicines you take and that have been prescribed to you. Take any new Medicines after you have completely understood and accpet all the possible adverse reactions/side effects.   Do not drive if your were admitted for syncope or siezures until you have seen by Primary MD or a Neurologist and advised to drive.  Do not drive when taking Pain medications.    Do not take more than prescribed Pain, Sleep and Anxiety Medications  Special Instructions: If you have smoked or chewed Tobacco  in the last 2 yrs please stop smoking, stop any regular Alcohol  and or any Recreational drug use.  Wear Seat belts while driving.

## 2011-07-04 NOTE — Discharge Summary (Signed)
[name], 76 y.o., DOB 1935-05-09, MRN [id number]. Admission date: 06/29/2011 Discharge Date 07/04/2011 Primary MD Gweneth Dimitri, MD, MD Admitting Physician Penny Pia, MD  Admission Diagnosis  COPD (chronic obstructive pulmonary disease) [496] CAP (community acquired pneumonia) [486] SHORTNESS OF BREATH  Discharge Diagnosis   Principal Problem:  *COPD with acute exacerbation Active Problems:  HYPERLIPIDEMIA  COPD UNSPECIFIED  ACID REFLUX DISEASE  Nicotine dependence  Constipation    Past Medical History  Diagnosis Date  . COPD (chronic obstructive pulmonary disease)   . Asthma   . GERD (gastroesophageal reflux disease)   . PONV (postoperative nausea and vomiting)     Past Surgical History  Procedure Date  . Abdominal hysterectomy   . Tonsillectomy   . Appendectomy   . Back surgery   . Gallbladder surgery      Hospital Course See H&P, Labs, Consult and Test reports for all details in brief, patient was admitted for    COPD with acute exacerbation (06/29/2011)  Wheezing much better, patient feels a whole lot better, post IV steroids, will switch to Po steroid taper, is on Home oxygen 2-3 lit /min, will continue, will get home RN to monitor, is going to sisters house, will place on 4 more days of Levaquin along with Home inhalers, counseled to stop smoking again. Will request to follow patient in 3 days, repeat CXR, consider outpt Pulmonary follow up.   Constipation: B regimen ordered, patient declined enema. Exam unremarkable.   HYPERLIPIDEMIA (05/15/2007) Stable continue current regimen.    ACID REFLUX DISEASE (05/15/2007) Stable no complaints today.      Significant Tests:  See full reports for all details     Dg Chest 2 View  06/29/2011  *RADIOLOGY REPORT*  Clinical Data: Shortness of breath.  Congestion and cough.  CHEST - 2 VIEW  Comparison: 10/12/2007.  Findings: The lungs are clear without focal infiltrate, edema, pneumothorax or pleural effusion.  Interstitial markings are diffusely coarsened with chronic features. Right apical scarring is stable.  The cardiopericardial silhouette is within normal limits for size. Imaged bony structures of the thorax are intact. Telemetry leads overlie the chest.  IMPRESSION: No acute cardiopulmonary process.  Original Report Authenticated By: ERIC A. MANSELL, M.D.      Today   Subjective:   [name] today has no headache,no chest abdominal pain,no new weakness tingling or numbness, feels much better wants to go home today.    Objective:   Blood pressure 122/73, pulse 83, temperature 98.4 F (36.9 C), temperature source Oral, resp. rate 20, height 5' (1.524 m), weight 75 kg (165 lb 5.5 oz), SpO2 94.00%.  Intake/Output Summary (Last 24 hours) at 07/04/11 1104 Last data filed at 07/03/11 1700  Gross per 24 hour  Intake    240 ml  Output      0 ml  Net    240 ml    Exam Awake Alert, Oriented *3, No new F.N deficits, Normal affect [place].AT,PERRAL Supple Neck,No JVD, No cervical lymphadenopathy appriciated.  Symmetrical Chest wall movement, Good air movement bilaterally, minimal wheezing RRR,No Gallops,Rubs or new Murmurs, No Parasternal Heave +ve B.Sounds, Abd Soft, Non tender, No organomegaly appriciated, No rebound -guarding or rigidity. No Cyanosis, Clubbing or edema, No new Rash or bruise  Data Review      CBC w Diff: Lab Results  Component Value Date   WBC 14.5* 07/04/2011   HGB 13.8 07/04/2011   HCT 43.4 07/04/2011   PLT 406* 07/04/2011   LYMPHOPCT 6* 06/29/2011  MONOPCT 6 06/29/2011   EOSPCT 0 06/29/2011   BASOPCT 0 06/29/2011   CMP: Lab Results  Component Value Date   NA 136 07/04/2011   K 4.7 07/04/2011   CL 90* 07/04/2011   CO2 39* 07/04/2011   BUN 17 07/04/2011   CREATININE 0.53 07/04/2011   PROT 4.9* 10/14/2007   ALBUMIN 2.5* 10/14/2007   BILITOT 0.4 10/14/2007   ALKPHOS 45 10/14/2007   AST 16 10/14/2007   ALT 20 10/14/2007  .  Micro Results No results found for this or any  previous visit (from the past 240 hour(s)).   Discharge Instructions     Follow with Primary MD MCNEILL,WENDY, MD, MD in 3 days   Get CBC, CMP, checked 3 days by Primary MD and again as instructed by your Primary MD. Get a 2 view Chest X ray done next visit.  Get Medicines reviewed and adjusted.  Please request your Prim.MD to go over all Hospital Tests and Procedure/Radiological results at the follow up, please get all Hospital records sent to your Prim MD by signing hospital release before you go home.  Activity: Fall precautions use walker/cane & assistance as needed  Diet: Cardiac, Aspiration precautions.  For Heart failure patients - Check your Weight same time everyday, if you gain over 2 pounds, or you develop in leg swelling, experience more shortness of breath or chest pain, call your Primary MD immediately. Follow Cardiac Low Salt Diet and 1.8 lit/day fluid restriction.  Disposition Home  If you experience worsening of your admission symptoms, develop shortness of breath, life threatening emergency, suicidal or homicidal thoughts you must seek medical attention immediately by calling 911 or calling your MD immediately  if symptoms less severe.  You Must read complete instructions/literature along with all the possible adverse reactions/side effects for all the Medicines you take and that have been prescribed to you. Take any new Medicines after you have completely understood and accpet all the possible adverse reactions/side effects.   Do not drive if your were admitted for syncope or siezures until you have seen by Primary MD or a Neurologist and advised to drive.  Do not drive when taking Pain medications.    Do not take more than prescribed Pain, Sleep and Anxiety Medications  Special Instructions: If you have smoked or chewed Tobacco  in the last 2 yrs please stop smoking, stop any regular Alcohol  and or any Recreational drug use.  Wear Seat belts while  driving.  Follow-up Information    Follow up with [facility], MD in 3 days.         Discharge Medications   Medication List  As of 07/04/2011 11:04 AM   START taking these medications         levofloxacin 500 MG tablet   Commonly known as: LEVAQUIN   Take 1 tablet (500 mg total) by mouth daily at 12 noon.      predniSONE 5 MG tablet   Commonly known as: DELTASONE   Take 1 tablet (5 mg total) by mouth daily. Label  & dispense according to the schedule below. 10 Pills PO for 3 days then, 8 Pills PO for 3 days, 6 Pills PO for 3 days, 4 Pills PO for 3 days, 2 Pills PO for 3 days, 1 Pills PO for 3 days, 1/2 Pill  PO for 3 days then STOP.         CONTINUE taking these medications         * albuterol  108 (90 BASE) MCG/ACT inhaler   Commonly known as: PROVENTIL HFA;VENTOLIN HFA      * albuterol (2.5 MG/3ML) 0.083% nebulizer solution   Commonly known as: PROVENTIL      atorvastatin 40 MG tablet   Commonly known as: LIPITOR      budesonide-formoterol 160-4.5 MCG/ACT inhaler   Commonly known as: SYMBICORT      CALCIUM + D PO      cetirizine 10 MG tablet   Commonly known as: ZYRTEC      fish oil-omega-3 fatty acids 1000 MG capsule      gabapentin 100 MG capsule   Commonly known as: NEURONTIN      Magnesium 250 MG Tabs      tiotropium 18 MCG inhalation capsule   Commonly known as: SPIRIVA      Vitamin B-12 2500 MCG Subl      Vitamin D 2000 UNITS tablet     * Notice: This list has 2 medication(s) that are the same as other medications prescribed for you. Read the directions carefully, and ask your doctor or other care provider to review them with you.        Where to get your medications    These are the prescriptions that you need to pick up.   You may get these medications from any pharmacy.         levofloxacin 500 MG tablet   predniSONE 5 MG tablet             Total Time in preparing paper work, data evaluation and todays exam - 35  minutes  Leroy Sea M.D on 07/04/2011 at 11:04 AM  [facility] Office  [phone]

## 2011-07-06 ENCOUNTER — Emergency Department (HOSPITAL_COMMUNITY): Payer: Medicare Other

## 2011-07-06 ENCOUNTER — Inpatient Hospital Stay (HOSPITAL_COMMUNITY)
Admission: EM | Admit: 2011-07-06 | Discharge: 2011-07-13 | DRG: 190 | Disposition: A | Discharge status: Home Health | Payer: Medicare Other | Attending: Family Medicine | Admitting: Family Medicine

## 2011-07-06 ENCOUNTER — Other Ambulatory Visit: Payer: Self-pay

## 2011-07-06 ENCOUNTER — Encounter (HOSPITAL_COMMUNITY): Payer: Self-pay | Admitting: *Deleted

## 2011-07-06 DIAGNOSIS — J40 Bronchitis, not specified as acute or chronic: Secondary | ICD-10-CM

## 2011-07-06 DIAGNOSIS — E860 Dehydration: Secondary | ICD-10-CM | POA: Diagnosis not present

## 2011-07-06 DIAGNOSIS — R079 Chest pain, unspecified: Secondary | ICD-10-CM | POA: Diagnosis present

## 2011-07-06 DIAGNOSIS — J441 Chronic obstructive pulmonary disease with (acute) exacerbation: Secondary | ICD-10-CM

## 2011-07-06 DIAGNOSIS — K219 Gastro-esophageal reflux disease without esophagitis: Secondary | ICD-10-CM

## 2011-07-06 DIAGNOSIS — R05 Cough: Secondary | ICD-10-CM | POA: Diagnosis not present

## 2011-07-06 DIAGNOSIS — J189 Pneumonia, unspecified organism: Secondary | ICD-10-CM | POA: Diagnosis not present

## 2011-07-06 DIAGNOSIS — R0602 Shortness of breath: Secondary | ICD-10-CM | POA: Diagnosis not present

## 2011-07-06 DIAGNOSIS — E785 Hyperlipidemia, unspecified: Secondary | ICD-10-CM

## 2011-07-06 DIAGNOSIS — F17200 Nicotine dependence, unspecified, uncomplicated: Secondary | ICD-10-CM

## 2011-07-06 DIAGNOSIS — R0989 Other specified symptoms and signs involving the circulatory and respiratory systems: Secondary | ICD-10-CM

## 2011-07-06 DIAGNOSIS — R197 Diarrhea, unspecified: Secondary | ICD-10-CM | POA: Diagnosis not present

## 2011-07-06 DIAGNOSIS — D72829 Elevated white blood cell count, unspecified: Secondary | ICD-10-CM | POA: Diagnosis present

## 2011-07-06 DIAGNOSIS — J449 Chronic obstructive pulmonary disease, unspecified: Secondary | ICD-10-CM

## 2011-07-06 DIAGNOSIS — J984 Other disorders of lung: Secondary | ICD-10-CM | POA: Diagnosis not present

## 2011-07-06 HISTORY — DX: Bronchitis, not specified as acute or chronic: J40

## 2011-07-06 LAB — POCT I-STAT, CHEM 8
Chloride: 97 mEq/L (ref 96–112)
Glucose, Bld: 130 mg/dL — ABNORMAL HIGH (ref 70–99)
HCT: 49 % — ABNORMAL HIGH (ref 36.0–46.0)
Potassium: 4.9 mEq/L (ref 3.5–5.1)

## 2011-07-06 LAB — CARDIAC PANEL(CRET KIN+CKTOT+MB+TROPI)
Relative Index: INVALID (ref 0.0–2.5)
Troponin I: <0.3 ng/mL (ref <0.30)

## 2011-07-06 LAB — URINALYSIS, ROUTINE W REFLEX MICROSCOPIC
Bilirubin Urine: NEGATIVE
Glucose, UA: NEGATIVE mg/dL
Hgb urine dipstick: NEGATIVE
Ketones, ur: NEGATIVE mg/dL
Leukocytes, UA: NEGATIVE
Nitrite: NEGATIVE
Protein, ur: NEGATIVE mg/dL
Specific Gravity, Urine: 1.025 (ref 1.005–1.030)
Urobilinogen, UA: 0.2 mg/dL (ref 0.0–1.0)
pH: 6.5 (ref 5.0–8.0)

## 2011-07-06 LAB — BLOOD GAS, ARTERIAL
Drawn by: 257701
O2 Content: 3 L/min
O2 Saturation: 96.1 %
Patient temperature: 98.6
pO2, Arterial: 84.7 mmHg (ref 80.0–100.0)

## 2011-07-06 LAB — POCT I-STAT TROPONIN I: Troponin i, poc: 0.01 ng/mL (ref 0.00–0.08)

## 2011-07-06 LAB — CBC
HCT: 45.2 % (ref 36.0–46.0)
MCV: 90.9 fL (ref 78.0–100.0)
Platelets: 353 10*3/uL (ref 150–400)
RBC: 4.97 MIL/uL (ref 3.87–5.11)
WBC: 19.2 10*3/uL — ABNORMAL HIGH (ref 4.0–10.5)

## 2011-07-06 MED ORDER — MAGNESIUM 250 MG PO TABS
1.0000 | ORAL_TABLET | Freq: Every day | ORAL | Status: DC
  Filled 2011-07-06: qty 1

## 2011-07-06 MED ORDER — MAGNESIUM GLUCONATE 500 MG PO TABS
250.0000 mg | ORAL_TABLET | Freq: Every day | ORAL | Status: DC
  Administered 2011-07-06 – 2011-07-08 (×3): 250 mg via ORAL
  Filled 2011-07-06 (×4): qty 1

## 2011-07-06 MED ORDER — METHYLPREDNISOLONE SODIUM SUCC 125 MG IJ SOLR
125.0000 mg | Freq: Four times a day (QID) | INTRAMUSCULAR | Status: DC
  Administered 2011-07-06 – 2011-07-07 (×2): 125 mg via INTRAVENOUS
  Filled 2011-07-06 (×6): qty 2

## 2011-07-06 MED ORDER — PIPERACILLIN SOD-TAZOBACTAM SO 2.25 (2-0.25) G IV SOLR
3.3750 g | Freq: Once | INTRAVENOUS | Status: AC
  Administered 2011-07-06: 3.375 g via INTRAVENOUS
  Filled 2011-07-06: qty 3.38

## 2011-07-06 MED ORDER — GABAPENTIN 300 MG PO CAPS
300.0000 mg | ORAL_CAPSULE | Freq: Every day | ORAL | Status: DC
  Administered 2011-07-06 – 2011-07-12 (×7): 300 mg via ORAL
  Filled 2011-07-06 (×8): qty 1

## 2011-07-06 MED ORDER — SENNA 8.6 MG PO TABS
1.0000 | ORAL_TABLET | Freq: Two times a day (BID) | ORAL | Status: DC
  Filled 2011-07-06 (×3): qty 1

## 2011-07-06 MED ORDER — ENOXAPARIN SODIUM 150 MG/ML ~~LOC~~ SOLN: 1.0000 mg/kg | Freq: Two times a day (BID) | SUBCUTANEOUS | Status: DC

## 2011-07-06 MED ORDER — ACETAMINOPHEN 650 MG RE SUPP: 650.0000 mg | Freq: Four times a day (QID) | RECTAL | Status: DC | PRN

## 2011-07-06 MED ORDER — METHYLPREDNISOLONE SODIUM SUCC 125 MG IJ SOLR
125.0000 mg | Freq: Once | INTRAMUSCULAR | Status: AC
  Administered 2011-07-06: 125 mg via INTRAVENOUS
  Filled 2011-07-06 (×2): qty 2

## 2011-07-06 MED ORDER — ALUM & MAG HYDROXIDE-SIMETH 200-200-20 MG/5ML PO SUSP
30.0000 mL | Freq: Four times a day (QID) | ORAL | Status: DC | PRN
Start: 2011-07-06 — End: 2011-07-08
  Administered 2011-07-08: 30 mL via ORAL
  Filled 2011-07-06: qty 30

## 2011-07-06 MED ORDER — ASPIRIN EC 325 MG PO TBEC
325.0000 mg | DELAYED_RELEASE_TABLET | Freq: Every day | ORAL | Status: DC
Start: 2011-07-06 — End: 2011-07-08
  Administered 2011-07-06 – 2011-07-08 (×3): 325 mg via ORAL
  Filled 2011-07-06 (×4): qty 1

## 2011-07-06 MED ORDER — MORPHINE SULFATE 4 MG/ML IJ SOLN
4.0000 mg | Freq: Once | INTRAMUSCULAR | Status: AC
  Administered 2011-07-06: 4 mg via INTRAVENOUS
  Filled 2011-07-06: qty 1

## 2011-07-06 MED ORDER — HYDROCODONE-ACETAMINOPHEN 5-325 MG PO TABS
1.0000 | ORAL_TABLET | ORAL | Status: DC | PRN
  Administered 2011-07-08 – 2011-07-12 (×8): 2 via ORAL
  Filled 2011-07-06 (×8): qty 2

## 2011-07-06 MED ORDER — LEVALBUTEROL HCL 0.63 MG/3ML IN NEBU
0.6300 mg | INHALATION_SOLUTION | RESPIRATORY_TRACT | Status: DC | PRN
  Filled 2011-07-06: qty 3

## 2011-07-06 MED ORDER — SODIUM CHLORIDE 0.9 % IV BOLUS (SEPSIS)
1000.0000 mL | Freq: Once | INTRAVENOUS | Status: AC
  Administered 2011-07-06: 1000 mL via INTRAVENOUS

## 2011-07-06 MED ORDER — ACETAMINOPHEN 325 MG PO TABS: 650.0000 mg | ORAL_TABLET | Freq: Four times a day (QID) | ORAL | Status: DC | PRN

## 2011-07-06 MED ORDER — SODIUM CHLORIDE 0.9 % IV BOLUS (SEPSIS)
500.0000 mL | INTRAVENOUS | Status: AC
  Administered 2011-07-06: 500 mL via INTRAVENOUS

## 2011-07-06 MED ORDER — VANCOMYCIN HCL 1000 MG IV SOLR
750.0000 mg | Freq: Two times a day (BID) | INTRAVENOUS | Status: DC
  Administered 2011-07-07 – 2011-07-09 (×6): 750 mg via INTRAVENOUS
  Filled 2011-07-06 (×7): qty 750

## 2011-07-06 MED ORDER — ONDANSETRON HCL 4 MG/2ML IJ SOLN
4.0000 mg | Freq: Once | INTRAMUSCULAR | Status: AC
  Administered 2011-07-06: 4 mg via INTRAVENOUS
  Filled 2011-07-06: qty 2

## 2011-07-06 MED ORDER — LEVALBUTEROL HCL 1.25 MG/0.5ML IN NEBU
1.2500 mg | INHALATION_SOLUTION | Freq: Four times a day (QID) | RESPIRATORY_TRACT | Status: DC
  Administered 2011-07-06 – 2011-07-07 (×5): 1.25 mg via RESPIRATORY_TRACT
  Filled 2011-07-06 (×10): qty 0.5

## 2011-07-06 MED ORDER — GABAPENTIN 100 MG PO CAPS: 100.0000 mg | ORAL_CAPSULE | Freq: Every day | ORAL | Status: DC

## 2011-07-06 MED ORDER — MORPHINE SULFATE 2 MG/ML IJ SOLN: 2.0000 mg | INTRAMUSCULAR | Status: DC | PRN

## 2011-07-06 MED ORDER — LEVOFLOXACIN IN D5W 750 MG/150ML IV SOLN
750.0000 mg | INTRAVENOUS | Status: DC
  Administered 2011-07-06 – 2011-07-07 (×2): 750 mg via INTRAVENOUS
  Filled 2011-07-06 (×2): qty 150

## 2011-07-06 MED ORDER — TIOTROPIUM BROMIDE MONOHYDRATE 18 MCG IN CAPS
18.0000 ug | ORAL_CAPSULE | Freq: Every day | RESPIRATORY_TRACT | Status: DC
  Administered 2011-07-07 – 2011-07-13 (×6): 18 ug via RESPIRATORY_TRACT
  Filled 2011-07-06: qty 5

## 2011-07-06 MED ORDER — LEVALBUTEROL HCL 1.25 MG/0.5ML IN NEBU
1.2500 mg | INHALATION_SOLUTION | Freq: Once | RESPIRATORY_TRACT | Status: DC
  Administered 2011-07-06: 1.25 mg via RESPIRATORY_TRACT
  Filled 2011-07-06: qty 0.5

## 2011-07-06 MED ORDER — ONDANSETRON HCL 4 MG/2ML IJ SOLN
4.0000 mg | Freq: Four times a day (QID) | INTRAMUSCULAR | Status: DC | PRN
  Filled 2011-07-06: qty 2

## 2011-07-06 MED ORDER — SODIUM CHLORIDE 0.9 % IJ SOLN
3.0000 mL | Freq: Two times a day (BID) | INTRAMUSCULAR | Status: DC
  Administered 2011-07-08 – 2011-07-12 (×5): 3 mL via INTRAVENOUS

## 2011-07-06 MED ORDER — ENOXAPARIN SODIUM 80 MG/0.8ML ~~LOC~~ SOLN
80.0000 mg | Freq: Two times a day (BID) | SUBCUTANEOUS | Status: DC
  Administered 2011-07-06 – 2011-07-07 (×2): 80 mg via SUBCUTANEOUS
  Filled 2011-07-06 (×4): qty 0.8

## 2011-07-06 MED ORDER — GABAPENTIN 100 MG PO CAPS: 200.0000 mg | ORAL_CAPSULE | Freq: Two times a day (BID) | ORAL | Status: DC

## 2011-07-06 MED ORDER — VANCOMYCIN HCL IN DEXTROSE 1-5 GM/200ML-% IV SOLN
1000.0000 mg | Freq: Once | INTRAVENOUS | Status: AC
  Administered 2011-07-06: 1000 mg via INTRAVENOUS
  Filled 2011-07-06: qty 200

## 2011-07-06 MED ORDER — METOCLOPRAMIDE HCL 5 MG/ML IJ SOLN
10.0000 mg | Freq: Once | INTRAMUSCULAR | Status: AC
  Administered 2011-07-06: 10 mg via INTRAVENOUS
  Filled 2011-07-06: qty 2

## 2011-07-06 MED ORDER — FAMOTIDINE IN NACL 20-0.9 MG/50ML-% IV SOLN
20.0000 mg | INTRAVENOUS | Status: DC
  Administered 2011-07-06 – 2011-07-08 (×3): 20 mg via INTRAVENOUS
  Filled 2011-07-06 (×3): qty 50

## 2011-07-06 MED ORDER — NITROGLYCERIN 0.4 MG SL SUBL: 0.4000 mg | SUBLINGUAL_TABLET | SUBLINGUAL | Status: DC | PRN

## 2011-07-06 MED ORDER — GUAIFENESIN ER 600 MG PO TB12
600.0000 mg | ORAL_TABLET | Freq: Two times a day (BID) | ORAL | Status: DC
  Administered 2011-07-06 – 2011-07-13 (×14): 600 mg via ORAL
  Filled 2011-07-06 (×16): qty 1

## 2011-07-06 MED ORDER — PIPERACILLIN-TAZOBACTAM 3.375 G IVPB 30 MIN
3.3750 g | Freq: Three times a day (TID) | INTRAVENOUS | Status: DC
  Administered 2011-07-06 – 2011-07-10 (×12): 3.375 g via INTRAVENOUS
  Filled 2011-07-06 (×15): qty 50

## 2011-07-06 MED ORDER — NICOTINE 14 MG/24HR TD PT24
14.0000 mg | MEDICATED_PATCH | Freq: Every day | TRANSDERMAL | Status: DC
  Administered 2011-07-07 – 2011-07-13 (×7): 14 mg via TRANSDERMAL
  Filled 2011-07-06 (×7): qty 1

## 2011-07-06 MED ORDER — SODIUM CHLORIDE 0.9 % IV SOLN
INTRAVENOUS | Status: AC
  Administered 2011-07-06 – 2011-07-08 (×4): via INTRAVENOUS

## 2011-07-06 MED ORDER — ONDANSETRON HCL 4 MG PO TABS
4.0000 mg | ORAL_TABLET | Freq: Four times a day (QID) | ORAL | Status: DC | PRN
  Administered 2011-07-09: 4 mg via ORAL

## 2011-07-06 NOTE — ED Provider Notes (Signed)
History     CSN: [id number]  Arrival date & time 07/06/11  1344   First MD Initiated Contact with Patient 07/06/11 1454      Chief Complaint  Patient presents with  . Chest Pain  . Nausea  . Diarrhea  Pt sent from [facility] for chest pain.  Recently in hospital  Admission Diagnosis COPD (chronic obstructive pulmonary disease) [496]  CAP (community acquired pneumonia) [486]  SHORTNESS OF BREATH  Discharge Diagnosis Principal Problem:  *COPD with acute exacerbation  Active Problems:  HYPERLIPIDEMIA  COPD UNSPECIFIED  ACID REFLUX DISEASE  Nicotine dependence  Constipation  The patient states that she did have similar symptoms while she was in the hospital. She also states that Loraine Leriche skeins is her cardiologist. She states she had a normal cardiac catheter years ago. The pain in her chest. It is constant, but worsened with coughing or breathing. She has mild diffuse, crampy, abdominal pain, with some nausea. She states that she did have these similar symptoms when she was in the hospital, but they were worse today. She's had no fever, significant production of phlegm. No back pain, dizziness, or syncope.  (Consider location/radiation/quality/duration/timing/severity/associated sxs/prior treatment) HPI  Past Medical History  Diagnosis Date  . COPD (chronic obstructive pulmonary disease)   . Asthma   . GERD (gastroesophageal reflux disease)   . PONV (postoperative nausea and vomiting)     Past Surgical History  Procedure Date  . Abdominal hysterectomy   . Tonsillectomy   . Appendectomy   . Back surgery   . Gallbladder surgery     No family history on file.  History  Substance Use Topics  . Smoking status: Current Everyday Smoker -- 0.2 packs/day for 50 years  . Smokeless tobacco: Never Used  . Alcohol Use: No    OB History    Grav Para Term Preterm Abortions TAB SAB Ect Mult Living                  Review of Systems  All other systems reviewed and are  negative.    Allergies  Kapidex and Zegerid  Home Medications   Current Outpatient Rx  Name Route Sig Dispense Refill  . ALBUTEROL SULFATE HFA 108 (90 BASE) MCG/ACT IN AERS Inhalation Inhale 2 puffs into the lungs every 6 (six) hours as needed. For shortness of breath.    . ALBUTEROL SULFATE (2.5 MG/3ML) 0.083% IN NEBU Nebulization Take 2.5 mg by nebulization 2 (two) times daily as needed. For shortness of breath.    . ASPIRIN 81 MG PO CHEW Oral Chew 81 mg by mouth daily.    . ATORVASTATIN CALCIUM 40 MG PO TABS Oral Take 40 mg by mouth daily.    . BUDESONIDE-FORMOTEROL FUMARATE 160-4.5 MCG/ACT IN AERO Inhalation Inhale 2 puffs into the lungs 2 (two) times daily.    Marland Kitchen CALCIUM + D PO Oral Take 1 tablet by mouth daily.     Marland Kitchen CETIRIZINE HCL 10 MG PO TABS Oral Take 10 mg by mouth daily.    Marland Kitchen VITAMIN D 2000 UNITS PO TABS Oral Take 2,000 Units by mouth daily.    Marland Kitchen VITAMIN B-12 1000 MCG SL SUBL Sublingual Place 1 tablet under the tongue daily.    . OMEGA-3 FATTY ACIDS 1000 MG PO CAPS Oral Take 2 g by mouth daily.    Marland Kitchen GABAPENTIN 100 MG PO CAPS Oral Take 200-300 mg by mouth 2 (two) times daily. 1-2 in am, 3 in pm    .  LEVOFLOXACIN 500 MG PO TABS Oral Take 1 tablet (500 mg total) by mouth daily at 12 noon. 4 tablet 0  . MAGNESIUM 250 MG PO TABS Oral Take 1 tablet by mouth daily.     Marland Kitchen OMEPRAZOLE 20 MG PO CPDR Oral Take 20 mg by mouth daily.    Marland Kitchen PREDNISONE 5 MG PO TABS Oral Take 1 tablet (5 mg total) by mouth daily. Label  & dispense according to the schedule below. 10 Pills PO for 3 days then, 8 Pills PO for 3 days, 6 Pills PO for 3 days, 4 Pills PO for 3 days, 2 Pills PO for 3 days, 1 Pills PO for 3 days, 1/2 Pill  PO for 3 days then STOP. 100 tablet 1    Dispense as written.  Marland Kitchen TIOTROPIUM BROMIDE MONOHYDRATE 18 MCG IN CAPS Inhalation Place 18 mcg into inhaler and inhale daily.      BP 101/47  Temp(Src) 97.7 F (36.5 C) (Oral)  Resp 20  SpO2 96%  Physical Exam  Nursing note and vitals  reviewed. Constitutional: She is oriented to person, place, and time. She appears well-developed and well-nourished.  HENT:  Head: Normocephalic and atraumatic.  Eyes: Conjunctivae and EOM are normal. Pupils are equal, round, and reactive to light.  Neck: Neck supple.  Cardiovascular: Normal rate and regular rhythm.  Exam reveals no gallop and no friction rub.   No murmur heard. Pulmonary/Chest: Effort normal and breath sounds normal. She has no wheezes. She has no rales. She exhibits no tenderness.       Coarse breath sounds with coughing. No obvious rhonchi or rales.  Abdominal: Soft. Bowel sounds are normal. She exhibits no distension. There is tenderness. There is no rebound and no guarding.       Mild diffuse tenderness. Obese abdomen. No rebound or guarding.  Musculoskeletal: Normal range of motion. She exhibits no edema and no tenderness.  Neurological: She is alert and oriented to person, place, and time. No cranial nerve deficit. Coordination normal.  Skin: Skin is warm and dry. No rash noted.  Psychiatric: She has a normal mood and affect.    ED Course  Procedures (including critical care time)   Labs Reviewed  CBC  URINALYSIS, ROUTINE W REFLEX MICROSCOPIC   No results found.   No diagnosis found.    MDM  Pt is seen and examined;  Initial history and physical completed.  Will follow.    Date: 07/06/2011  Rate: 74  Rhythm: normal sinus rhythm  QRS Axis: normal  Intervals: normal  ST/T Wave abnormalities: ST elevations inferiorly  Conduction Disutrbances:none  Narrative Interpretation:   Old EKG Reviewed: changes noted  Results for orders placed during the hospital encounter of 07/06/11  CBC      Component Value Range   WBC 19.2 (*) 4.0 - 10.5 (K/uL)   RBC 4.97  3.87 - 5.11 (MIL/uL)   Hemoglobin 14.8  12.0 - 15.0 (g/dL)   HCT 40.9  81.1 - 91.4 (%)   MCV 90.9  78.0 - 100.0 (fL)   MCH 29.8  26.0 - 34.0 (pg)   MCHC 32.7  30.0 - 36.0 (g/dL)   RDW 78.2  95.6  - 21.3 (%)   Platelets 353  150 - 400 (K/uL)  URINALYSIS, ROUTINE W REFLEX MICROSCOPIC      Component Value Range   Color, Urine YELLOW  YELLOW    APPearance CLEAR  CLEAR    Specific Gravity, Urine 1.025  1.005 - 1.030  pH 6.5  5.0 - 8.0    Glucose, UA NEGATIVE  NEGATIVE (mg/dL)   Hgb urine dipstick NEGATIVE  NEGATIVE    Bilirubin Urine NEGATIVE  NEGATIVE    Ketones, ur NEGATIVE  NEGATIVE (mg/dL)   Protein, ur NEGATIVE  NEGATIVE (mg/dL)   Urobilinogen, UA 0.2  0.0 - 1.0 (mg/dL)   Nitrite NEGATIVE  NEGATIVE    Leukocytes, UA NEGATIVE  NEGATIVE   POCT I-STAT, CHEM 8      Component Value Range   Sodium 134 (*) 135 - 145 (mEq/L)   Potassium 4.9  3.5 - 5.1 (mEq/L)   Chloride 97  96 - 112 (mEq/L)   BUN 31 (*) 6 - 23 (mg/dL)   Creatinine, Ser 6.57  0.50 - 1.10 (mg/dL)   Glucose, Bld 846 (*) 70 - 99 (mg/dL)   Calcium, Ion 9.62 (*) 1.12 - 1.32 (mmol/L)   TCO2 33  0 - 100 (mmol/L)   Hemoglobin 16.7 (*) 12.0 - 15.0 (g/dL)   HCT 95.2 (*) 84.1 - 46.0 (%)  POCT I-STAT TROPONIN I      Component Value Range   Troponin i, poc 0.01  0.00 - 0.08 (ng/mL)   Comment 3           BLOOD GAS, ARTERIAL      Component Value Range   O2 Content 3.0     Delivery systems NASAL CANNULA     pH, Arterial 7.395  7.350 - 7.400    pCO2 arterial 50.3 (*) 35.0 - 45.0 (mmHg)   pO2, Arterial 84.7  80.0 - 100.0 (mmHg)   Bicarbonate 30.1 (*) 20.0 - 24.0 (mEq/L)   TCO2 26.8  0 - 100 (mmol/L)   Acid-Base Excess 4.7 (*) 0.0 - 2.0 (mmol/L)   O2 Saturation 96.1     Patient temperature 98.6     Collection site RIGHT RADIAL     Drawn by [id number]     Sample type ARTERIAL DRAW     Allens test (pass/fail) PASS  PASS   D-DIMER, QUANTITATIVE      Component Value Range   D-Dimer, Quant 1.10 (*) 0.00 - 0.48 (ug/mL-FEU)  PRO B NATRIURETIC PEPTIDE      Component Value Range   Pro B Natriuretic peptide (BNP) 348.6 (*) 0 - 125 (pg/mL)  CARDIAC PANEL(CRET KIN+CKTOT+MB+TROPI)      Component Value Range   Total CK 37  7 -  177 (U/L)   CK, MB 1.6  0.3 - 4.0 (ng/mL)   Troponin I <0.30  <0.30 (ng/mL)   Relative Index RELATIVE INDEX IS INVALID  0.0 - 2.5   CARDIAC PANEL(CRET KIN+CKTOT+MB+TROPI)      Component Value Range   Total CK 53  7 - 177 (U/L)   CK, MB 1.9  0.3 - 4.0 (ng/mL)   Troponin I <0.30  <0.30 (ng/mL)   Relative Index RELATIVE INDEX IS INVALID  0.0 - 2.5   LIPID PANEL      Component Value Range   Cholesterol 135  0 - 200 (mg/dL)   Triglycerides 94  <027 (mg/dL)   HDL 69  >25 (mg/dL)   Total CHOL/HDL Ratio 2.0     VLDL 19  0 - 40 (mg/dL)   LDL Cholesterol 47  0 - 99 (mg/dL)  COMPREHENSIVE METABOLIC PANEL      Component Value Range   Sodium 135  135 - 145 (mEq/L)   Potassium 3.6  3.5 - 5.1 (mEq/L)   Chloride 97  96 - 112 (  mEq/L)   CO2 26  19 - 32 (mEq/L)   Glucose, Bld 145 (*) 70 - 99 (mg/dL)   BUN 19  6 - 23 (mg/dL)   Creatinine, Ser 4.69  0.50 - 1.10 (mg/dL)   Calcium 7.7 (*) 8.4 - 10.5 (mg/dL)   Total Protein 5.8 (*) 6.0 - 8.3 (g/dL)   Albumin 2.9 (*) 3.5 - 5.2 (g/dL)   AST 57 (*) 0 - 37 (U/L)   ALT 68 (*) 0 - 35 (U/L)   Alkaline Phosphatase 67  39 - 117 (U/L)   Total Bilirubin 0.3  0.3 - 1.2 (mg/dL)   GFR calc non Af Amer 88 (*) >90 (mL/min)   GFR calc Af Amer >90  >90 (mL/min)  MAGNESIUM      Component Value Range   Magnesium 2.0  1.5 - 2.5 (mg/dL)  PHOSPHORUS      Component Value Range   Phosphorus 3.2  2.3 - 4.6 (mg/dL)  CBC      Component Value Range   WBC 15.6 (*) 4.0 - 10.5 (K/uL)   RBC 4.62  3.87 - 5.11 (MIL/uL)   Hemoglobin 13.8  12.0 - 15.0 (g/dL)   HCT 62.9  52.8 - 41.3 (%)   MCV 89.0  78.0 - 100.0 (fL)   MCH 29.9  26.0 - 34.0 (pg)   MCHC 33.6  30.0 - 36.0 (g/dL)   RDW 24.4  01.0 - 27.2 (%)   Platelets 378  150 - 400 (K/uL)  DIFFERENTIAL      Component Value Range   Neutrophils Relative 95 (*) 43 - 77 (%)   Neutro Abs 14.8 (*) 1.7 - 7.7 (K/uL)   Lymphocytes Relative 4 (*) 12 - 46 (%)   Lymphs Abs 0.5 (*) 0.7 - 4.0 (K/uL)   Monocytes Relative 2 (*) 3 - 12  (%)   Monocytes Absolute 0.2  0.1 - 1.0 (K/uL)   Eosinophils Relative 0  0 - 5 (%)   Eosinophils Absolute 0.0  0.0 - 0.7 (K/uL)   Basophils Relative 0  0 - 1 (%)   Basophils Absolute 0.0  0.0 - 0.1 (K/uL)  APTT      Component Value Range   aPTT 28  24 - 37 (seconds)  PROTIME-INR      Component Value Range   Prothrombin Time 13.1  11.6 - 15.2 (seconds)   INR 0.97  0.00 - 1.49    Dg Chest 2 View  06/29/2011  *RADIOLOGY REPORT*  Clinical Data: Shortness of breath.  Congestion and cough.  CHEST - 2 VIEW  Comparison: 10/12/2007.  Findings: The lungs are clear without focal infiltrate, edema, pneumothorax or pleural effusion. Interstitial markings are diffusely coarsened with chronic features. Right apical scarring is stable.  The cardiopericardial silhouette is within normal limits for size. Imaged bony structures of the thorax are intact. Telemetry leads overlie the chest.  IMPRESSION: No acute cardiopulmonary process.  Original Report Authenticated By: ERIC A. MANSELL, M.D.   Dg Chest Portable 1 View  07/06/2011  *RADIOLOGY REPORT*  Clinical Data: .  Shortness of breath.  Chest pain.  Nausea and diarrhea.  PORTABLE CHEST - 1 VIEW  Comparison: 06/29/2011  Findings: Midline trachea.  Normal heart size.  No right pleural effusion.  Cannot exclude minimal left pleural fluid. No pneumothorax.  Nonspecific mild interstitial thickening, lower lobe predominant.  Patchy left base air space disease.  Right lung clear.  Apparent left base nodularity was absent on 03/13.  IMPRESSION:  1.  New  patchy left base air space disease.  Suspicious for infection or aspiration.  Atelectasis could look similar.  If PA and lateral radiographs are possible, they should be considered. 2.  Apparent nodularity at the left lung base.  Favored to be technique related. Recommend attention on follow-up. 3.  Possible trace left pleural fluid.  Original Report Authenticated By: Consuello Bossier, M.D.             Weylin Plagge A.  Patrica Duel, MD 07/07/11 1006

## 2011-07-06 NOTE — ED Notes (Signed)
Pt reports chest pain that feels like her chest is "closed" Also reports sinus congestion. Worse with a deep breath. Diarrhea that began today. Was constipated earlier this week. Took stool softener and suppository. BMs yesterday but became watery today. Pt sts she still felt bad when leaving the hospital, never got rid of cough, has productive cough with green phlegm.

## 2011-07-06 NOTE — ED Notes (Signed)
Attempted to call report. Nurse was in room. Will call back.

## 2011-07-06 NOTE — H&P (Addendum)
PCP:   MCNEILL,WENDY, MD, MD   Chief Complaint:  Chest pain/SOB for 2 days.  HPI: [name] was discharged from the hospital 2 days ago, after management for an acute exacerbation of COPD. Prior to that hospitalisation, she had had about a week of cough productive of yellow colored sputum. She was treated with bronchodilators/steroids/levaquin. She says she has not felt terribly great since her symptoms started. She now complaints of pleuritic chest pain, which she says radiates to the back. A CXR showed LLL consolidation, which is new compared to recent cxr. Her wbc was 08657 today. She has had nausea and diarrhea but no vomiting. She gets dyspneic with minimal exertion. Patient has been given vanc/zosyn/some ivf, morphine and referred for admission. Denies cough.  Review of Systems:  Unremarkable except as highlighted in hpi.  Past Medical History: Past Medical History  Diagnosis Date  . COPD (chronic obstructive pulmonary disease)   . Asthma   . GERD (gastroesophageal reflux disease)   . PONV (postoperative nausea and vomiting)    Past Surgical History  Procedure Date  . Abdominal hysterectomy   . Tonsillectomy   . Appendectomy   . Back surgery   . Gallbladder surgery     Medications: Prior to Admission medications   Medication Sig Start Date End Date Taking? Authorizing Provider  albuterol (PROVENTIL HFA;VENTOLIN HFA) 108 (90 BASE) MCG/ACT inhaler Inhale 2 puffs into the lungs every 6 (six) hours as needed. For shortness of breath.   Yes Historical Provider, MD  albuterol (PROVENTIL) (2.5 MG/3ML) 0.083% nebulizer solution Take 2.5 mg by nebulization 2 (two) times daily as needed. For shortness of breath.   Yes Historical Provider, MD  aspirin 81 MG chewable tablet Chew 81 mg by mouth daily.   Yes Historical Provider, MD  atorvastatin (LIPITOR) 40 MG tablet Take 40 mg by mouth daily.   Yes Historical Provider, MD  budesonide-formoterol (SYMBICORT) 160-4.5 MCG/ACT inhaler Inhale  2 puffs into the lungs 2 (two) times daily.   Yes Historical Provider, MD  Calcium Carbonate-Vitamin D (CALCIUM + D PO) Take 1 tablet by mouth daily.    Yes Historical Provider, MD  cetirizine (ZYRTEC) 10 MG tablet Take 10 mg by mouth daily.   Yes Historical Provider, MD  Cholecalciferol (VITAMIN D) 2000 UNITS tablet Take 2,000 Units by mouth daily.   Yes Historical Provider, MD  Cyanocobalamin (VITAMIN B-12) 1000 MCG SUBL Place 1 tablet under the tongue daily.   Yes Historical Provider, MD  fish oil-omega-3 fatty acids 1000 MG capsule Take 2 g by mouth daily.   Yes Historical Provider, MD  gabapentin (NEURONTIN) 100 MG capsule Take 200-300 mg by mouth 2 (two) times daily. 1-2 in am, 3 in pm   Yes Historical Provider, MD  levofloxacin (LEVAQUIN) 500 MG tablet Take 1 tablet (500 mg total) by mouth daily at 12 noon. 07/04/11 07/14/11 Yes Leroy Sea, MD  Magnesium 250 MG TABS Take 1 tablet by mouth daily.    Yes Historical Provider, MD  omeprazole (PRILOSEC) 20 MG capsule Take 20 mg by mouth daily.   Yes Historical Provider, MD  predniSONE (DELTASONE) 5 MG tablet Take 1 tablet (5 mg total) by mouth daily. Label  & dispense according to the schedule below. 10 Pills PO for 3 days then, 8 Pills PO for 3 days, 6 Pills PO for 3 days, 4 Pills PO for 3 days, 2 Pills PO for 3 days, 1 Pills PO for 3 days, 1/2 Pill  PO for 3  days then STOP. 07/04/11 07/14/11 Yes Leroy Sea, MD  tiotropium (SPIRIVA) 18 MCG inhalation capsule Place 18 mcg into inhaler and inhale daily.   Yes Historical Provider, MD    Allergies:   Allergies  Allergen Reactions  . Kapidex (Dexlansoprazole)     Stomach pain, diarrhea  . Zegerid (Omeprazole)     Burning in chest    Social History:  reports that she has been smoking.  She has never used smokeless tobacco. She reports that she does not drink alcohol or use illicit drugs.  Family History: History reviewed. No pertinent family history.  Physical Exam: Filed Vitals:     07/06/11 1406  BP: 101/47  Temp: 97.7 F (36.5 C)  TempSrc: Oral  Resp: 20  SpO2: 96%   In respiratory distress at time of encounter with me("she has just come from the bathroom"), she is sitting at edge of bed. No JVD. PERRLA. No carotid bruits. Reduced air entry bilaterally. Scant wheezing. Some coarse rhonchi on the left side. S1S2 heard. No murmurs. Abdomen- soft, non tender. +BS. CNS- grossly Intact. Extremities- no pedal edema. Peripheral pulses equal.   Labs on Admission:   [facility] 07/06/11 1549 07/04/11 0540  NA 134* 136  K 4.9 4.7  CL 97 90*  CO2 -- 39*  GLUCOSE 130* 167*  BUN 31* 17  CREATININE 0.80 0.53  CALCIUM -- 9.7  MG -- --  PHOS -- --   No results found for this basename: AST:2,ALT:2,ALKPHOS:2,BILITOT:2,PROT:2,ALBUMIN:2 in the last 72 hours No results found for this basename: LIPASE:2,AMYLASE:2 in the last 72 hours  Basename 07/06/11 1549 07/06/11 1534 07/04/11 0540  WBC -- 19.2* 14.5*  NEUTROABS -- -- --  HGB 16.7* 14.8 --  HCT 49.0* 45.2 --  MCV -- 90.9 93.1  PLT -- 353 406*   No results found for this basename: CKTOTAL:3,CKMB:3,CKMBINDEX:3,TROPONINI:3 in the last 72 hours No results found for this basename: TSH,T4TOTAL,FREET3,T3FREE,THYROIDAB in the last 72 hours No results found for this basename: VITAMINB12:2,FOLATE:2,FERRITIN:2,TIBC:2,IRON:2,RETICCTPCT:2 in the last 72 hours  Radiological Exams on Admission: Dg Chest 2 View  06/29/2011  *RADIOLOGY REPORT*  Clinical Data: Shortness of breath.  Congestion and cough.  CHEST - 2 VIEW  Comparison: 10/12/2007.  Findings: The lungs are clear without focal infiltrate, edema, pneumothorax or pleural effusion. Interstitial markings are diffusely coarsened with chronic features. Right apical scarring is stable.  The cardiopericardial silhouette is within normal limits for size. Imaged bony structures of the thorax are intact. Telemetry leads overlie the chest.  IMPRESSION: No acute cardiopulmonary process.   Original Report Authenticated By: ERIC A. MANSELL, M.D.   Dg Chest Portable 1 View  07/06/2011  *RADIOLOGY REPORT*  Clinical Data: .  Shortness of breath.  Chest pain.  Nausea and diarrhea.  PORTABLE CHEST - 1 VIEW  Comparison: 06/29/2011  Findings: Midline trachea.  Normal heart size.  No right pleural effusion.  Cannot exclude minimal left pleural fluid. No pneumothorax.  Nonspecific mild interstitial thickening, lower lobe predominant.  Patchy left base air space disease.  Right lung clear.  Apparent left base nodularity was absent on 03/13.  IMPRESSION:  1.  New patchy left base air space disease.  Suspicious for infection or aspiration.  Atelectasis could look similar.  If PA and lateral radiographs are possible, they should be considered. 2.  Apparent nodularity at the left lung base.  Favored to be technique related. Recommend attention on follow-up. 3.  Possible trace left pleural fluid.  Original Report Authenticated By: Karn Cassis.  Reche Dixon, M.D.    Assessment 76 year old female with COPD and active tobacco use, recently managed for COPD exacerbation, discharged on levaquin, who comes back with pleuritic chest pain radiating to the back, says she has not really recuperated from the initial presentation. She has LLL pneumonia aspiration versus hcap versus partially treated CAP. Other concern would be for PE and acs. Leukocytosis may be steroid induced. Patient dehydrated with hemoconcentration. Plan .Bronchitis/Healthcare-associated pneumonia/Chest pain/Leukocytosis- will admit telemetry. Obtain serial cardiac enzymes/ct angio/2decho. Meanwhile broad spectrum abx(vanc/zosyn/levaquin), lovenox till pe ruled out, trial of nitroglycerin/gentle fluids/smoking cessation counseling given/nicotine patch/steroids/bronchodilators/Oxygen/ASA. Marland Kitchen GERD- pepcid. Condition very closely guarded. Discussed plan of care with patient's sister and grand daughter at bed side.    Cady Hafen [phone]. 07/06/2011,  6:25 PM

## 2011-07-06 NOTE — ED Notes (Signed)
Pt in from [facility], ems called out for "possible MI", [facility] and ems ekg "borderline" for R wave progression, otherwise NSR. Pt was in hospital for resp infection, d/c Wed. Still feels bad, sts new chest pain and diarrhea this am. Chest pain positional, worse with a deep breath. Denies vomiting, diaphoresis.

## 2011-07-06 NOTE — ED Notes (Signed)
MD Ranga at bedside.

## 2011-07-06 NOTE — Progress Notes (Signed)
ANTIBIOTIC CONSULT NOTE - INITIAL  Pharmacy Consult for Vancomycin Indication: rule out pneumonia  Allergies  Allergen Reactions  . Kapidex (Dexlansoprazole)     Stomach pain, diarrhea  . Zegerid (Omeprazole)     Burning in chest    Patient Measurements: Height: 5' (152.4 cm) Weight: 163 lb 12.8 oz (74.3 kg) IBW/kg (Calculated) : 45.5   Vital Signs: Temp: 98.4 F (36.9 C) (03/15 2101) Temp src: Oral (03/15 2101) BP: 97/56 mmHg (03/15 2101) Pulse Rate: 69  (03/15 2101)  Labs:  Basename 07/06/11 1549 07/06/11 1534 07/04/11 0540  WBC -- 19.2* 14.5*  HGB 16.7* 14.8 13.8  PLT -- 353 406*  LABCREA -- -- --  CREATININE 0.80 -- 0.53   Estimated Creatinine Clearance: 56.4 ml/min (by C-G formula based on Cr of 0.8). No results found for this basename: VANCOTROUGH:2,VANCOPEAK:2,VANCORANDOM:2,GENTTROUGH:2,GENTPEAK:2,GENTRANDOM:2,TOBRATROUGH:2,TOBRAPEAK:2,TOBRARND:2,AMIKACINPEAK:2,AMIKACINTROU:2,AMIKACIN:2, in the last 72 hours   Microbiology: No results found for this or any previous visit (from the past 720 hour(s)).  Medical History: Past Medical History  Diagnosis Date  . COPD (chronic obstructive pulmonary disease)   . Asthma   . GERD (gastroesophageal reflux disease)   . PONV (postoperative nausea and vomiting)     Medications:  Anti-infectives     Start     Dose/Rate Route Frequency Ordered Stop   07/06/11 2200   piperacillin-tazobactam (ZOSYN) IVPB 3.375 g        3.375 g 100 mL/hr over 30 Minutes Intravenous 3 times per day 07/06/11 2059 07/13/11 2159   07/06/11 2200   Levofloxacin (LEVAQUIN) IVPB 750 mg        750 mg 100 mL/hr over 90 Minutes Intravenous Every 24 hours 07/06/11 2059 07/11/11 2159   07/06/11 1715   vancomycin (VANCOCIN) IVPB 1000 mg/200 mL premix        1,000 mg 200 mL/hr over 60 Minutes Intravenous  Once 07/06/11 1701 07/06/11 2000   07/06/11 1715   piperacillin-tazobactam (ZOSYN) 3.375 g in dextrose 5 % 50 mL IVPB        3.375 g 100  mL/hr over 30 Minutes Intravenous  Once 07/06/11 1701 07/06/11 1812         Assessment:  76 yo F admit with suspected pneumonia  Levaquin and Zosyn ordered per MD  Renal function appears normal, CrCl ~ 56 ml/min  First dose of vancomycin given 3/15 at 1900  Goal of Therapy:  Vancomycin trough level 15-20 mcg/ml  Plan:   Vancomycin 750mg  IV Q12h  Measure antibiotic drug levels at steady state  Follow up culture results, renal function, and labs as available    Lynann Beaver PharmD, BCPS Pager [phone] 07/06/2011 9:18 PM

## 2011-07-06 NOTE — ED Notes (Signed)
QIO:NG29<BM> Expected date:07/06/11<BR> Expected time: 1:28 PM<BR> Means of arrival:Ambulance<BR> Comments:<BR> EMS 90 GC- Transfer from [facility] for evaluation of N/V/D, and general weakness. Pt was released from [facility] on Wednesday.

## 2011-07-07 ENCOUNTER — Other Ambulatory Visit (HOSPITAL_COMMUNITY): Payer: PRIVATE HEALTH INSURANCE

## 2011-07-07 HISTORY — PX: TRANSTHORACIC ECHOCARDIOGRAM: SHX275

## 2011-07-07 LAB — CARDIAC PANEL(CRET KIN+CKTOT+MB+TROPI)
CK, MB: 1.9 ng/mL (ref 0.3–4.0)
CK, MB: 1.8 ng/mL (ref 0.3–4.0)
Relative Index: INVALID (ref 0.0–2.5)
Total CK: 53 U/L (ref 7–177)
Total CK: 54 U/L (ref 7–177)
Troponin I: <0.3 ng/mL (ref <0.30)

## 2011-07-07 LAB — CBC
HCT: 41.1 % (ref 36.0–46.0)
MCV: 89 fL (ref 78.0–100.0)
RBC: 4.62 MIL/uL (ref 3.87–5.11)
WBC: 15.6 10*3/uL — ABNORMAL HIGH (ref 4.0–10.5)

## 2011-07-07 LAB — COMPREHENSIVE METABOLIC PANEL
ALT: 68 U/L — ABNORMAL HIGH (ref 0–35)
AST: 57 U/L — ABNORMAL HIGH (ref 0–37)
CO2: 26 mEq/L (ref 19–32)
Calcium: 7.7 mg/dL — ABNORMAL LOW (ref 8.4–10.5)
GFR calc non Af Amer: 88 mL/min — ABNORMAL LOW (ref >90)
Potassium: 3.6 mEq/L (ref 3.5–5.1)
Sodium: 135 mEq/L (ref 135–145)
Total Protein: 5.8 g/dL — ABNORMAL LOW (ref 6.0–8.3)

## 2011-07-07 LAB — DIFFERENTIAL
Eosinophils Relative: 0 % (ref 0–5)
Lymphocytes Relative: 4 % — ABNORMAL LOW (ref 12–46)
Lymphs Abs: 0.5 10*3/uL — ABNORMAL LOW (ref 0.7–4.0)
Monocytes Absolute: 0.2 10*3/uL (ref 0.1–1.0)

## 2011-07-07 LAB — CLOSTRIDIUM DIFFICILE BY PCR: Toxigenic C. Difficile by PCR: NEGATIVE

## 2011-07-07 LAB — LIPID PANEL
HDL: 69 mg/dL (ref >39)
LDL Cholesterol: 47 mg/dL (ref 0–99)

## 2011-07-07 MED ORDER — SODIUM CHLORIDE 0.9 % IV BOLUS (SEPSIS)
500.0000 mL | Freq: Once | INTRAVENOUS | Status: AC
  Administered 2011-07-07: 500 mL via INTRAVENOUS

## 2011-07-07 MED ORDER — ENOXAPARIN SODIUM 40 MG/0.4ML ~~LOC~~ SOLN
40.0000 mg | SUBCUTANEOUS | Status: DC
  Administered 2011-07-08 – 2011-07-13 (×6): 40 mg via SUBCUTANEOUS
  Filled 2011-07-07 (×6): qty 0.4

## 2011-07-07 MED ORDER — METHYLPREDNISOLONE SODIUM SUCC 125 MG IJ SOLR
125.0000 mg | Freq: Three times a day (TID) | INTRAMUSCULAR | Status: DC
  Administered 2011-07-07 – 2011-07-08 (×3): 125 mg via INTRAVENOUS
  Filled 2011-07-07 (×6): qty 2

## 2011-07-07 MED ORDER — LORAZEPAM 2 MG/ML IJ SOLN: 0.5000 mg | Freq: Once | INTRAMUSCULAR | Status: DC

## 2011-07-07 MED ORDER — POTASSIUM CHLORIDE 20 MEQ/15ML (10%) PO LIQD
40.0000 meq | Freq: Once | ORAL | Status: AC
  Administered 2011-07-07: 40 meq via ORAL
  Filled 2011-07-07: qty 30

## 2011-07-07 NOTE — Progress Notes (Signed)
  Echocardiogram 2D Echocardiogram has been performed.  [name] [facility] 07/07/2011, 10:03 AM

## 2011-07-07 NOTE — Progress Notes (Signed)
CT ready for pt.  In to speak with pt about going for CT.  Pt adamantly refused to go for CT.  Stated she was extremely claustrophobic and could not breathe when placed in machine.  Explained that she had anti anxiety med ordered to help.  However, stated "I will not have it done".  Spoke with CT staff and informed them pt will not have CT.  Will also inform MD test not going to be done.

## 2011-07-07 NOTE — Progress Notes (Signed)
SUBJECTIVE Ms Robideau feels better but complaints of "diarrhea all night- started yesterday morning". She says she cannot stand CT/MRI and refuses to have it done. i have explained the need for the CT, especially given a d-dimer of 1.1, which could obviously be none specific.   1. Dehydration   2. Chest pain   3. Healthcare-associated pneumonia     Past Medical History  Diagnosis Date  . COPD (chronic obstructive pulmonary disease)   . Asthma   . GERD (gastroesophageal reflux disease)   . PONV (postoperative nausea and vomiting)    Current Facility-Administered Medications  Medication Dose Route Frequency Provider Last Rate Last Dose  . 0.9 %  sodium chloride infusion   Intravenous Continuous Christeena Krogh, MD 50 mL/hr at 07/06/11 2156    . acetaminophen (TYLENOL) tablet 650 mg  650 mg Oral Q6H PRN Chauna Osoria, MD       Or  . acetaminophen (TYLENOL) suppository 650 mg  650 mg Rectal Q6H PRN Maraya Gwilliam, MD      . alum & mag hydroxide-simeth (MAALOX/MYLANTA) 200-200-20 MG/5ML suspension 30 mL  30 mL Oral Q6H PRN Tanaisha Pittman, MD      . aspirin EC tablet 325 mg  325 mg Oral Daily Elkin Belfield, MD   325 mg at 07/07/11 1005  . enoxaparin (LOVENOX) injection 40 mg  40 mg Subcutaneous Q24H Cherity Blickenstaff, MD      . famotidine (PEPCID) IVPB 20 mg  20 mg Intravenous Q24H Yareth Kearse, MD   20 mg at 07/06/11 1928  . gabapentin (NEURONTIN) capsule 300 mg  300 mg Oral QHS Faizah Kandler, MD   300 mg at 07/06/11 2235  . guaiFENesin (MUCINEX) 12 hr tablet 600 mg  600 mg Oral BID Chaunda Vandergriff, MD   600 mg at 07/07/11 1004  . HYDROcodone-acetaminophen (NORCO) 5-325 MG per tablet 1-2 tablet  1-2 tablet Oral Q4H PRN Lalah Durango, MD      . levalbuterol (XOPENEX) nebulizer solution 0.63 mg  0.63 mg Nebulization Q2H PRN Jihaad Bruschi, MD      . levalbuterol (XOPENEX) nebulizer solution 1.25 mg  1.25 mg Nebulization Q6H Rosenda Geffrard, MD   1.25 mg at 07/07/11 0836  . Levofloxacin (LEVAQUIN) IVPB 750  mg  750 mg Intravenous Q24H Adan Beal, MD   750 mg at 07/06/11 2155  . LORazepam (ATIVAN) injection 0.5 mg  0.5 mg Intravenous Once Donel Osowski, MD      . magnesium gluconate (MAGONATE) tablet 250 mg  250 mg Oral Daily Ariabella Brien, MD   250 mg at 07/07/11 1004  . methylPREDNISolone sodium succinate (SOLU-MEDROL) 125 mg/2 mL injection 125 mg  125 mg Intravenous Once Cybill Uriegas, MD   125 mg at 07/06/11 1752  . methylPREDNISolone sodium succinate (SOLU-MEDROL) 125 mg/2 mL injection 125 mg  125 mg Intravenous Q8H Calvin Chura, MD      . metoCLOPramide (REGLAN) injection 10 mg  10 mg Intravenous Once Quentin Shorey, MD   10 mg at 07/06/11 1753  . morphine 2 MG/ML injection 2 mg  2 mg Intravenous Q4H PRN Harjit Douds, MD      . morphine 4 MG/ML injection 4 mg  4 mg Intravenous Once Peter A. Tucich, MD   4 mg at 07/06/11 1627  . nicotine (NICODERM CQ - dosed in mg/24 hours) patch 14 mg  14 mg Transdermal Daily Davi Rotan, MD   14 mg at 07/07/11 1005  . nitroGLYCERIN (NITROSTAT) SL tablet 0.4 mg  0.4 mg  Sublingual Q5 Min x 3 PRN Mitsuo Budnick, MD      . ondansetron (ZOFRAN) injection 4 mg  4 mg Intravenous Once Peter A. Patrica Duel, MD   4 mg at 07/06/11 1625  . ondansetron (ZOFRAN) tablet 4 mg  4 mg Oral Q6H PRN Rakesha Dalporto, MD       Or  . ondansetron (ZOFRAN) injection 4 mg  4 mg Intravenous Q6H PRN Emslee Lopezmartinez, MD      . piperacillin-tazobactam (ZOSYN) 3.375 g in dextrose 5 % 50 mL IVPB  3.375 g Intravenous Once Peter A. Tucich, MD   3.375 g at 07/06/11 1742  . piperacillin-tazobactam (ZOSYN) IVPB 3.375 g  3.375 g Intravenous Q8H Brantleigh Mifflin, MD   3.375 g at 07/07/11 0511  . potassium chloride 20 MEQ/15ML (10%) liquid 40 mEq  40 mEq Oral Once Garyn Arlotta, MD   40 mEq at 07/07/11 1005  . senna (SENOKOT) tablet 8.6 mg  1 tablet Oral BID Saquoia Sianez, MD      . sodium chloride 0.9 % bolus 1,000 mL  1,000 mL Intravenous Once Peter A. Tucich, MD   1,000 mL at 07/06/11 1743  . sodium  chloride 0.9 % bolus 500 mL  500 mL Intravenous STAT Peter A. Tucich, MD   500 mL at 07/06/11 1600  . sodium chloride 0.9 % injection 3 mL  3 mL Intravenous Q12H Emilyn Ruble, MD      . tiotropium (SPIRIVA) inhalation capsule 18 mcg  18 mcg Inhalation Daily Kenedi Cilia, MD   18 mcg at 07/07/11 0839  . vancomycin (VANCOCIN) 750 mg in sodium chloride 0.9 % 150 mL IVPB  750 mg Intravenous Q12H Christine E Shade, PHARMD   750 mg at 07/07/11 0459  . vancomycin (VANCOCIN) IVPB 1000 mg/200 mL premix  1,000 mg Intravenous Once Peter A. Tucich, MD   1,000 mg at 07/06/11 1900  . DISCONTD: enoxaparin (LOVENOX) injection 1 mg/kg  1 mg/kg Subcutaneous Q12H Flemon Kelty, MD      . DISCONTD: enoxaparin (LOVENOX) injection 80 mg  80 mg Subcutaneous Q12H Tavon Magnussen, MD   80 mg at 07/07/11 0511  . DISCONTD: gabapentin (NEURONTIN) capsule 100-200 mg  100-200 mg Oral Daily Carlyann Placide, MD      . DISCONTD: gabapentin (NEURONTIN) capsule 200-300 mg  200-300 mg Oral BID Bardia Wangerin, MD      . DISCONTD: levalbuterol (XOPENEX) nebulizer solution 1.25 mg  1.25 mg Nebulization Once Karmel Patricelli, MD   1.25 mg at 07/06/11 1825  . DISCONTD: Magnesium TABS 250 mg  1 tablet Oral Daily Havish Petties, MD      . DISCONTD: methylPREDNISolone sodium succinate (SOLU-MEDROL) 125 mg/2 mL injection 125 mg  125 mg Intravenous Q6H Mily Malecki, MD   125 mg at 07/07/11 0459   Allergies  Allergen Reactions  . Kapidex (Dexlansoprazole)     Stomach pain, diarrhea  . Zegerid (Omeprazole)     Burning in chest   Active Problems:  Bronchitis  Healthcare-associated pneumonia  Chest pain  Leukocytosis   Vital signs in last 24 hours: Temp:  [97.7 F (36.5 C)-98.9 F (37.2 C)] 98.9 F (37.2 C) (03/16 0700) Pulse Rate:  [69-95] 79  (03/16 0700) Resp:  [15-26] 18  (03/16 0700) BP: (67-119)/(37-101) 109/62 mmHg (03/16 0700) SpO2:  [86 %-99 %] 94 % (03/16 0840) Weight:  [74.3 kg (163 lb 12.8 oz)-74.39 kg (164 lb)] 74.3 kg  (163 lb 12.8 oz) (03/15 2101) Weight change:  Last BM Date: 07/06/11  Intake/Output from previous day: 03/15 0701 - 03/16 0700 In: -  Out: 750 [Urine:750] Intake/Output this shift:    Lab Results:  Basename 07/07/11 0240 07/06/11 1549 07/06/11 1534  WBC 15.6* -- 19.2*  HGB 13.8 16.7* --  HCT 41.1 49.0* --  PLT 378 -- 353   BMET  Basename 07/07/11 0240 07/06/11 1549  NA 135 134*  K 3.6 4.9  CL 97 97  CO2 26 --  GLUCOSE 145* 130*  BUN 19 31*  CREATININE 0.60 0.80  CALCIUM 7.7* --    Studies/Results: Dg Chest Portable 1 View  07/06/2011  *RADIOLOGY REPORT*  Clinical Data: .  Shortness of breath.  Chest pain.  Nausea and diarrhea.  PORTABLE CHEST - 1 VIEW  Comparison: 06/29/2011  Findings: Midline trachea.  Normal heart size.  No right pleural effusion.  Cannot exclude minimal left pleural fluid. No pneumothorax.  Nonspecific mild interstitial thickening, lower lobe predominant.  Patchy left base air space disease.  Right lung clear.  Apparent left base nodularity was absent on 03/13.  IMPRESSION:  1.  New patchy left base air space disease.  Suspicious for infection or aspiration.  Atelectasis could look similar.  If PA and lateral radiographs are possible, they should be considered. 2.  Apparent nodularity at the left lung base.  Favored to be technique related. Recommend attention on follow-up. 3.  Possible trace left pleural fluid.  Original Report Authenticated By: Consuello Bossier, M.D.    Medications: I have reviewed the patient's current medications.   Physical exam GENERAL- alert HEAD- normal atraumatic, no neck masses, normal thyroid, no jvd RESPIRATORY- coughing. No wheezing. CVS- regular rate and rhythm, S1, S2 normal, no murmur, click, rub or gallop ABDOMEN- abdomen is soft without significant tenderness, masses, organomegaly or guarding NEURO- Grossly normal EXTREMITIES- extremities normal, atraumatic, no cyanosis or  edema  Plan .Bronchitis/Healthcare-associated pneumonia/Chest pain/Leukocytosis- clinically better. WBC trending down- so likley real infection as patient still on steroids. Patient refuses CT angio or other imaging to check for PE. D.dimer is slightly high. Will check ultrasound lower extremities for dvt, meanwhile, see how she does on treatment for PNA alone. Follow 2d echo. .Diarrhea- abx induced vs c.dff. Check c.diff pcr/stool culture. Marland Kitchen GERD- Continue pepcid. dvt prophylaxis- lovenox.  Condition remainsclosely guarded. Discussed plan of care with patient's sister.      [name] 07/07/2011 12:37 PM Pager: [phone].

## 2011-07-08 DIAGNOSIS — R0602 Shortness of breath: Secondary | ICD-10-CM

## 2011-07-08 LAB — BASIC METABOLIC PANEL
BUN: 20 mg/dL (ref 6–23)
Calcium: 7 mg/dL — ABNORMAL LOW (ref 8.4–10.5)
GFR calc non Af Amer: >90 mL/min (ref >90)
Glucose, Bld: 132 mg/dL — ABNORMAL HIGH (ref 70–99)
Potassium: 3.4 mEq/L — ABNORMAL LOW (ref 3.5–5.1)

## 2011-07-08 LAB — CBC
HCT: 40.2 % (ref 36.0–46.0)
Hemoglobin: 13 g/dL (ref 12.0–15.0)
MCH: 29.5 pg (ref 26.0–34.0)
MCHC: 32.3 g/dL (ref 30.0–36.0)

## 2011-07-08 MED ORDER — ZOLPIDEM TARTRATE 5 MG PO TABS
5.0000 mg | ORAL_TABLET | Freq: Every evening | ORAL | Status: DC | PRN
  Administered 2011-07-08 – 2011-07-12 (×5): 5 mg via ORAL
  Filled 2011-07-08 (×5): qty 1

## 2011-07-08 MED ORDER — POTASSIUM CHLORIDE 20 MEQ/15ML (10%) PO LIQD
40.0000 meq | Freq: Once | ORAL | Status: AC
  Administered 2011-07-08: 40 meq via ORAL
  Filled 2011-07-08: qty 30

## 2011-07-08 MED ORDER — LEVALBUTEROL HCL 1.25 MG/0.5ML IN NEBU
1.2500 mg | INHALATION_SOLUTION | Freq: Four times a day (QID) | RESPIRATORY_TRACT | Status: DC
  Administered 2011-07-08 – 2011-07-12 (×13): 1.25 mg via RESPIRATORY_TRACT
  Filled 2011-07-08 (×24): qty 0.5

## 2011-07-08 MED ORDER — METRONIDAZOLE 500 MG PO TABS
500.0000 mg | ORAL_TABLET | Freq: Three times a day (TID) | ORAL | Status: DC
  Administered 2011-07-08 – 2011-07-11 (×11): 500 mg via ORAL
  Filled 2011-07-08 (×16): qty 1

## 2011-07-08 MED ORDER — PREDNISONE 20 MG PO TABS
40.0000 mg | ORAL_TABLET | Freq: Every day | ORAL | Status: DC
  Administered 2011-07-09 – 2011-07-11 (×3): 40 mg via ORAL
  Filled 2011-07-08 (×4): qty 2

## 2011-07-08 MED ORDER — DIPHENOXYLATE-ATROPINE 2.5-0.025 MG PO TABS
1.0000 | ORAL_TABLET | Freq: Once | ORAL | Status: AC
  Administered 2011-07-08: 1 via ORAL
  Filled 2011-07-08: qty 1

## 2011-07-08 MED ORDER — FLORANEX PO PACK
1.0000 g | PACK | Freq: Three times a day (TID) | ORAL | Status: DC
  Administered 2011-07-09 – 2011-07-13 (×14): 1 g via ORAL
  Filled 2011-07-08 (×16): qty 1

## 2011-07-08 MED ORDER — LOPERAMIDE HCL 2 MG PO CAPS
4.0000 mg | ORAL_CAPSULE | ORAL | Status: DC | PRN
  Administered 2011-07-08 – 2011-07-12 (×4): 4 mg via ORAL
  Filled 2011-07-08 (×2): qty 2
  Filled 2011-07-08: qty 1
  Filled 2011-07-08 (×2): qty 2

## 2011-07-08 MED ORDER — ALPRAZOLAM 0.25 MG PO TABS
0.2500 mg | ORAL_TABLET | Freq: Every evening | ORAL | Status: DC | PRN
  Filled 2011-07-08: qty 1

## 2011-07-08 NOTE — Progress Notes (Signed)
VASCULAR LAB PRELIMINARY  PRELIMINARY  PRELIMINARY  PRELIMINARY  Bilateral lower extremity venous Dopplers completed.    Preliminary report:  No DVT or SVT noted in the bilateral lower extremities.  Sherren Kerns [place], 07/08/2011, 9:50 AM

## 2011-07-08 NOTE — Progress Notes (Signed)
BP is low; notified on-call physician. Ordered bolus NS.

## 2011-07-08 NOTE — Progress Notes (Signed)
07/07/11 Patient c/o iv site being uncomfortable. NS is infusing alone; placed warm compress over site. Patient reports site feels better.

## 2011-07-08 NOTE — Progress Notes (Signed)
SUBJECTIVE Says breathing is better but she could not sleep as she had to be constantly on the pot due to diarrhea.   1. Dehydration   2. Chest pain   3. Healthcare-associated pneumonia   4. Other dyspnea and respiratory abnormality     Past Medical History  Diagnosis Date  . COPD (chronic obstructive pulmonary disease)   . Asthma   . GERD (gastroesophageal reflux disease)   . PONV (postoperative nausea and vomiting)    Current Facility-Administered Medications  Medication Dose Route Frequency Provider Last Rate Last Dose  . 0.9 %  sodium chloride infusion   Intravenous Continuous Dennard Vezina, MD 50 mL/hr at 07/08/11 1342    . acetaminophen (TYLENOL) tablet 650 mg  650 mg Oral Q6H PRN Talonda Artist, MD       Or  . acetaminophen (TYLENOL) suppository 650 mg  650 mg Rectal Q6H PRN Saralynn Langhorst, MD      . ALPRAZolam Prudy Feeler) tablet 0.25 mg  0.25 mg Oral QHS PRN Adonys Wildes, MD      . diphenoxylate-atropine (LOMOTIL) 2.5-0.025 MG per tablet 1 tablet  1 tablet Oral Once Deshayla Empson, MD      . enoxaparin (LOVENOX) injection 40 mg  40 mg Subcutaneous Q24H Azul Brumett, MD   40 mg at 07/08/11 0801  . gabapentin (NEURONTIN) capsule 300 mg  300 mg Oral QHS Nino Amano, MD   300 mg at 07/07/11 2207  . guaiFENesin (MUCINEX) 12 hr tablet 600 mg  600 mg Oral BID Cayci Mcnabb, MD   600 mg at 07/08/11 0951  . HYDROcodone-acetaminophen (NORCO) 5-325 MG per tablet 1-2 tablet  1-2 tablet Oral Q4H PRN Conley Canal, MD   2 tablet at 07/08/11 1451  . lactobacillus (FLORANEX/LACTINEX) granules 1 g  1 g Oral TID WC Krystle Oberman, MD      . levalbuterol (XOPENEX) nebulizer solution 0.63 mg  0.63 mg Nebulization Q2H PRN Lamanda Rudder, MD      . levalbuterol (XOPENEX) nebulizer solution 1.25 mg  1.25 mg Nebulization Q6H WA Tandre Conly, MD   1.25 mg at 07/08/11 1617  . loperamide (IMODIUM) capsule 4 mg  4 mg Oral PRN Marquise Wicke, MD   4 mg at 07/08/11 1347  . LORazepam (ATIVAN) injection 0.5 mg   0.5 mg Intravenous Once Ryker Sudbury, MD      . metroNIDAZOLE (FLAGYL) tablet 500 mg  500 mg Oral Q8H Press Casale, MD   500 mg at 07/08/11 1710  . morphine 2 MG/ML injection 2 mg  2 mg Intravenous Q4H PRN Chayse Zatarain, MD      . nicotine (NICODERM CQ - dosed in mg/24 hours) patch 14 mg  14 mg Transdermal Daily Ramzi Brathwaite, MD   14 mg at 07/08/11 0955  . ondansetron (ZOFRAN) tablet 4 mg  4 mg Oral Q6H PRN Nikiyah Fackler, MD       Or  . ondansetron (ZOFRAN) injection 4 mg  4 mg Intravenous Q6H PRN Yash Cacciola, MD      . piperacillin-tazobactam (ZOSYN) IVPB 3.375 g  3.375 g Intravenous Q8H Annia Belt, PHARMD   3.375 g at 07/08/11 1342  . predniSONE (DELTASONE) tablet 40 mg  40 mg Oral Q breakfast Jibril Mcminn, MD      . sodium chloride 0.9 % bolus 500 mL  500 mL Intravenous Once Jinger Neighbors, NP   500 mL at 07/07/11 2225  . sodium chloride 0.9 % injection 3 mL  3 mL Intravenous Q12H  Conley Canal, MD   3 mL at 07/08/11 0952  . tiotropium (SPIRIVA) inhalation capsule 18 mcg  18 mcg Inhalation Daily Sherrill Buikema, MD   18 mcg at 07/08/11 0936  . vancomycin (VANCOCIN) 750 mg in sodium chloride 0.9 % 150 mL IVPB  750 mg Intravenous Q12H Christine E Shade, PHARMD   750 mg at 07/08/11 1811  . zolpidem (AMBIEN) tablet 5 mg  5 mg Oral QHS PRN Jinger Neighbors, NP   5 mg at 07/08/11 0144  . DISCONTD: alum & mag hydroxide-simeth (MAALOX/MYLANTA) 200-200-20 MG/5ML suspension 30 mL  30 mL Oral Q6H PRN Tu Bayle, MD   30 mL at 07/08/11 0211  . DISCONTD: aspirin EC tablet 325 mg  325 mg Oral Daily Karaline Buresh, MD   325 mg at 07/08/11 0951  . DISCONTD: famotidine (PEPCID) IVPB 20 mg  20 mg Intravenous Q24H Ramond Darnell, MD   20 mg at 07/08/11 1710  . DISCONTD: levalbuterol (XOPENEX) nebulizer solution 1.25 mg  1.25 mg Nebulization Q6H Nyah Shepherd, MD   1.25 mg at 07/07/11 1948  . DISCONTD: Levofloxacin (LEVAQUIN) IVPB 750 mg  750 mg Intravenous Q24H Lashaunta Sicard, MD   750 mg at 07/07/11 2223    . DISCONTD: magnesium gluconate (MAGONATE) tablet 250 mg  250 mg Oral Daily Twain Stenseth, MD   250 mg at 07/08/11 0951  . DISCONTD: methylPREDNISolone sodium succinate (SOLU-MEDROL) 125 mg/2 mL injection 125 mg  125 mg Intravenous Q8H Toneka Fullen, MD   125 mg at 07/08/11 0550  . DISCONTD: nitroGLYCERIN (NITROSTAT) SL tablet 0.4 mg  0.4 mg Sublingual Q5 Min x 3 PRN Jani Ploeger, MD      . DISCONTD: senna (SENOKOT) tablet 8.6 mg  1 tablet Oral BID Conley Canal, MD       Allergies  Allergen Reactions  . Kapidex (Dexlansoprazole)     Stomach pain, diarrhea  . Zegerid (Omeprazole)     Burning in chest   Active Problems:  Bronchitis  Healthcare-associated pneumonia  Chest pain  Leukocytosis   Vital signs in last 24 hours: Temp:  [97.5 F (36.4 C)-98.6 F (37 C)] 98.6 F (37 C) (03/17 1300) Pulse Rate:  [69-79] 69  (03/17 1300) Resp:  [16] 16  (03/17 1300) BP: (89-113)/(45-59) 113/59 mmHg (03/17 1300) SpO2:  [96 %-97 %] 96 % (03/17 1500) Weight:  [77.474 kg (170 lb 12.8 oz)] 77.474 kg (170 lb 12.8 oz) (03/17 0459) Weight change: 3.084 kg (6 lb 12.8 oz) Last BM Date: 07/08/11  Intake/Output from previous day: 03/16 0701 - 03/17 0700 In: 2775 [I.V.:1725; IV Piggyback:1050] Out: -  Intake/Output this shift:    Lab Results:  Basename 07/08/11 0518 07/07/11 0240  WBC 24.0* 15.6*  HGB 13.0 13.8  HCT 40.2 41.1  PLT 365 378   BMET  Basename 07/08/11 0518 07/07/11 0240  NA 139 135  K 3.4* 3.6  CL 103 97  CO2 24 26  GLUCOSE 132* 145*  BUN 20 19  CREATININE 0.54 0.60  CALCIUM 7.0* 7.7*    Studies/Results: No results found.  Medications: I have reviewed the patient's current medications.   Physical exam GENERAL- alert HEAD- normal atraumatic, no neck masses, normal thyroid, no jvd RESPIRATORY- appears well, vitals normal, no respiratory distress, acyanotic, normal RR, ear and throat exam is normal, neck free of mass or lymphadenopathy, chest clear, no  wheezing, crepitations, rhonchi, normal symmetric air entry CVS- regular rate and rhythm, S1, S2 normal, no murmur, click, rub or gallop  ABDOMEN- abdomen is soft without significant tenderness, masses, organomegaly or guarding NEURO- Grossly normal EXTREMITIES- extremities normal, atraumatic, no cyanosis or edema  Plan .Bronchitis/Healthcare-associated pneumonia/Chest pain/Leukocytosis- clinically better. But wbc increased. ?steroid induced versus ongoing infection-?gastroenteritis. Add flagyl. Change steroids to oral prednisone. D/c levaquin in view of diarrhea. If wbc worsening, repeat cxr tomorrow.  .Diarrhea- abx induced versus infectious etiology, versus other meds. D/c levaquin/sennakot. Immdoium. Flagyl. Marland Kitchen GERD- watch off Pepcid for now, given diarrhea.  dvt prophylaxis- lovenox.  Condition remains closely guarded. Discussed plan of care with patient's sister.        Vondra Aldredge 07/08/2011 6:22 PM Pager: [phone].

## 2011-07-09 LAB — CBC
HCT: 37.3 % (ref 36.0–46.0)
Hemoglobin: 12.1 g/dL (ref 12.0–15.0)
MCHC: 32.4 g/dL (ref 30.0–36.0)
RBC: 4.06 MIL/uL (ref 3.87–5.11)
WBC: 21.3 10*3/uL — ABNORMAL HIGH (ref 4.0–10.5)

## 2011-07-09 LAB — COMPREHENSIVE METABOLIC PANEL
ALT: 51 U/L — ABNORMAL HIGH (ref 0–35)
Alkaline Phosphatase: 50 U/L (ref 39–117)
BUN: 17 mg/dL (ref 6–23)
CO2: 29 mEq/L (ref 19–32)
Chloride: 105 mEq/L (ref 96–112)
GFR calc Af Amer: >90 mL/min (ref >90)
Glucose, Bld: 85 mg/dL (ref 70–99)
Potassium: 3 mEq/L — ABNORMAL LOW (ref 3.5–5.1)
Sodium: 138 mEq/L (ref 135–145)
Total Bilirubin: 0.2 mg/dL — ABNORMAL LOW (ref 0.3–1.2)
Total Protein: 5 g/dL — ABNORMAL LOW (ref 6.0–8.3)

## 2011-07-09 LAB — MAGNESIUM: Magnesium: 2.1 mg/dL (ref 1.5–2.5)

## 2011-07-09 MED ORDER — POTASSIUM CHLORIDE CRYS ER 20 MEQ PO TBCR
40.0000 meq | EXTENDED_RELEASE_TABLET | Freq: Once | ORAL | Status: AC
  Administered 2011-07-09: 40 meq via ORAL
  Filled 2011-07-09: qty 2

## 2011-07-09 MED ORDER — POTASSIUM CHLORIDE 20 MEQ/15ML (10%) PO LIQD
40.0000 meq | Freq: Once | ORAL | Status: AC
  Administered 2011-07-09: 40 meq via ORAL
  Filled 2011-07-09: qty 30

## 2011-07-09 MED ORDER — POTASSIUM CHLORIDE 20 MEQ/15ML (10%) PO LIQD
40.0000 meq | Freq: Once | ORAL | Status: AC
  Administered 2011-07-09: 40 meq via ORAL
  Filled 2011-07-09 (×2): qty 30

## 2011-07-09 MED ORDER — VANCOMYCIN HCL 1000 MG IV SOLR
1250.0000 mg | Freq: Two times a day (BID) | INTRAVENOUS | Status: DC
  Administered 2011-07-10 – 2011-07-12 (×5): 1250 mg via INTRAVENOUS
  Filled 2011-07-09 (×7): qty 1250

## 2011-07-09 MED ORDER — CALCIUM GLUCONATE 10 % IV SOLN
1.0000 g | Freq: Once | INTRAVENOUS | Status: AC
  Administered 2011-07-09: 1 g via INTRAVENOUS
  Filled 2011-07-09: qty 10

## 2011-07-09 NOTE — Progress Notes (Signed)
ANTIBIOTIC CONSULT NOTE - FOLLOW UP  Pharmacy Consult for Vancomycin Indication: r/o PNA  Allergies  Allergen Reactions  . Kapidex (Dexlansoprazole)     Stomach pain, diarrhea  . Zegerid (Omeprazole)     Burning in chest    Patient Measurements: Height: 5' (152.4 cm) Weight: 175 lb 8 oz (79.606 kg) IBW/kg (Calculated) : 45.5    Vital Signs: Temp: 98 F (36.7 C) (03/18 1336) Temp src: Oral (03/18 1336) BP: 102/60 mmHg (03/18 1336) Pulse Rate: 74  (03/18 1336) Intake/Output from previous day: 03/17 0701 - 03/18 0700 In: 1780 [P.O.:480; I.V.:1200; IV Piggyback:100] Out: -  Intake/Output from this shift:    Labs:  Basename 07/09/11 0554 07/08/11 0518 07/07/11 0240  WBC 21.3* 24.0* 15.6*  HGB 12.1 13.0 13.8  PLT 291 365 378  LABCREA -- -- --  CREATININE 0.59 0.54 0.60   Estimated Creatinine Clearance: 58.4 ml/min (by C-G formula based on Cr of 0.59).  Basename 07/09/11 1735  VANCOTROUGH 8.0*  VANCOPEAK --  Drue Dun --  GENTTROUGH --  GENTPEAK --  GENTRANDOM --  TOBRATROUGH --  TOBRAPEAK --  TOBRARND --  AMIKACINPEAK --  AMIKACINTROU --  AMIKACIN --     Microbiology: Recent Results (from the past 720 hour(s))  CLOSTRIDIUM DIFFICILE BY PCR     Status: Normal   Collection Time   07/07/11  1:44 PM      Component Value Range Status Comment   C difficile by pcr NEGATIVE  NEGATIVE  Final   STOOL CULTURE     Status: Normal (Preliminary result)   Collection Time   07/07/11  1:44 PM      Component Value Range Status Comment   Specimen Description STOOL   Final    Special Requests NONE   Final    Culture     Final    Value: NO SUSPICIOUS COLONIES, CONTINUING TO HOLD     Note: REDUCED NORMAL FLORA PRESENT   Report Status PENDING   Incomplete     Anti-infectives     Start     Dose/Rate Route Frequency Ordered Stop   07/08/11 0915   metroNIDAZOLE (FLAGYL) tablet 500 mg        500 mg Oral 3 times per day 07/08/11 0901     07/07/11 0600   vancomycin  (VANCOCIN) 750 mg in sodium chloride 0.9 % 150 mL IVPB        750 mg 150 mL/hr over 60 Minutes Intravenous Every 12 hours 07/06/11 2128     07/06/11 2200  piperacillin-tazobactam (ZOSYN) IVPB 3.375 g       3.375 g 12.5 mL/hr over 240 Minutes Intravenous 3 times per day 07/06/11 2059 07/13/11 2159   07/06/11 2200   Levofloxacin (LEVAQUIN) IVPB 750 mg  Status:  Discontinued        750 mg 100 mL/hr over 90 Minutes Intravenous Every 24 hours 07/06/11 2059 07/08/11 0901   07/06/11 1715   vancomycin (VANCOCIN) IVPB 1000 mg/200 mL premix        1,000 mg 200 mL/hr over 60 Minutes Intravenous  Once 07/06/11 1701 07/06/11 2000   07/06/11 1715  piperacillin-tazobactam (ZOSYN) 3.375 g in dextrose 5 % 50 mL IVPB       3.375 g 100 mL/hr over 30 Minutes Intravenous  Once 07/06/11 1701 07/06/11 1812          Assessment:  76 yo on vancomycin for suspected PNA on Day # 4 vancomycin  Vancomycin trough subtherapeutic, will adjust dose  accordingly.    Goal of Therapy:  Vancomycin trough level 15-20 mcg/ml  Plan:  1.) increase vancomycin to 1250 q12h, first dose at 0200 2.) Monitor renal function and check vancomycin troughs as needed  [name], Loma Messing PharmD 8:00 PM 07/09/2011

## 2011-07-09 NOTE — Progress Notes (Signed)
Diarrhea stopped but not feeling too good today. Breathing is better.    1. Dehydration   2. Chest pain   3. Healthcare-associated pneumonia   4. Other dyspnea and respiratory abnormality     Past Medical History  Diagnosis Date  . COPD (chronic obstructive pulmonary disease)   . Asthma   . GERD (gastroesophageal reflux disease)   . PONV (postoperative nausea and vomiting)    Current Facility-Administered Medications  Medication Dose Route Frequency Provider Last Rate Last Dose  . 0.9 %  sodium chloride infusion   Intravenous Continuous Saahas Hidrogo, MD 50 mL/hr at 07/08/11 1342    . acetaminophen (TYLENOL) tablet 650 mg  650 mg Oral Q6H PRN Peighton Edgin, MD       Or  . acetaminophen (TYLENOL) suppository 650 mg  650 mg Rectal Q6H PRN Keishawn Darsey, MD      . ALPRAZolam Prudy Feeler) tablet 0.25 mg  0.25 mg Oral QHS PRN Riki Gehring, MD      . calcium gluconate 1 g in sodium chloride 0.9 % 100 mL IVPB  1 g Intravenous Once Donetta Isaza, MD   1 g at 07/09/11 1140  . enoxaparin (LOVENOX) injection 40 mg  40 mg Subcutaneous Q24H Sherea Liptak, MD   40 mg at 07/09/11 0904  . gabapentin (NEURONTIN) capsule 300 mg  300 mg Oral QHS Keymiah Lyles, MD   300 mg at 07/08/11 2300  . guaiFENesin (MUCINEX) 12 hr tablet 600 mg  600 mg Oral BID Desirey Keahey, MD   600 mg at 07/09/11 0904  . HYDROcodone-acetaminophen (NORCO) 5-325 MG per tablet 1-2 tablet  1-2 tablet Oral Q4H PRN Conley Canal, MD   2 tablet at 07/09/11 1149  . lactobacillus (FLORANEX/LACTINEX) granules 1 g  1 g Oral TID WC Casi Westerfeld, MD   1 g at 07/09/11 1743  . levalbuterol (XOPENEX) nebulizer solution 0.63 mg  0.63 mg Nebulization Q2H PRN Elcie Pelster, MD      . levalbuterol (XOPENEX) nebulizer solution 1.25 mg  1.25 mg Nebulization Q6H WA Maniyah Moller, MD   1.25 mg at 07/09/11 1345  . loperamide (IMODIUM) capsule 4 mg  4 mg Oral PRN Boby Eyer, MD   4 mg at 07/08/11 1347  . LORazepam (ATIVAN) injection 0.5 mg  0.5 mg  Intravenous Once Blia Totman, MD      . metroNIDAZOLE (FLAGYL) tablet 500 mg  500 mg Oral Q8H Sarita Hakanson, MD   500 mg at 07/09/11 1504  . morphine 2 MG/ML injection 2 mg  2 mg Intravenous Q4H PRN Tonimarie Gritz, MD      . nicotine (NICODERM CQ - dosed in mg/24 hours) patch 14 mg  14 mg Transdermal Daily Atina Feeley, MD   14 mg at 07/09/11 0907  . ondansetron (ZOFRAN) tablet 4 mg  4 mg Oral Q6H PRN Edwar Coe, MD   4 mg at 07/09/11 1520   Or  . ondansetron (ZOFRAN) injection 4 mg  4 mg Intravenous Q6H PRN Eddrick Dilone, MD      . piperacillin-tazobactam (ZOSYN) IVPB 3.375 g  3.375 g Intravenous Q8H Annia Belt, PHARMD   3.375 g at 07/09/11 1503  . potassium chloride 20 MEQ/15ML (10%) liquid 40 mEq  40 mEq Oral Once Conley Canal, MD   40 mEq at 07/08/11 2048  . potassium chloride 20 MEQ/15ML (10%) liquid 40 mEq  40 mEq Oral Once Kou Gucciardo, MD   40 mEq at 07/09/11 1140  . potassium chloride 20 MEQ/15ML (  10%) liquid 40 mEq  40 mEq Oral Once Tamzin Bertling, MD   40 mEq at 07/09/11 1743  . predniSONE (DELTASONE) tablet 40 mg  40 mg Oral Q breakfast Bethany Cumming, MD   40 mg at 07/09/11 0905  . sodium chloride 0.9 % injection 3 mL  3 mL Intravenous Q12H Bitania Shankland, MD   3 mL at 07/08/11 0952  . tiotropium (SPIRIVA) inhalation capsule 18 mcg  18 mcg Inhalation Daily Tykira Wachs, MD   18 mcg at 07/09/11 0820  . vancomycin (VANCOCIN) 750 mg in sodium chloride 0.9 % 150 mL IVPB  750 mg Intravenous Q12H Christine E Shade, PHARMD   750 mg at 07/09/11 1800  . zolpidem (AMBIEN) tablet 5 mg  5 mg Oral QHS PRN Jinger Neighbors, NP   5 mg at 07/08/11 0144   Allergies  Allergen Reactions  . Kapidex (Dexlansoprazole)     Stomach pain, diarrhea  . Zegerid (Omeprazole)     Burning in chest   Active Problems:  Bronchitis  Healthcare-associated pneumonia  Chest pain  Leukocytosis   Vital signs in last 24 hours: Temp:  [97.6 F (36.4 C)-98 F (36.7 C)] 98 F (36.7 C) (03/18  1336) Pulse Rate:  [74-76] 74  (03/18 1336) Resp:  [16] 16  (03/18 1336) BP: (102-105)/(49-65) 102/60 mmHg (03/18 1336) SpO2:  [96 %-100 %] 97 % (03/18 1348) Weight:  [79.606 kg (175 lb 8 oz)] 79.606 kg (175 lb 8 oz) (03/18 0543) Weight change: 2.132 kg (4 lb 11.2 oz) Last BM Date: 07/08/11  Intake/Output from previous day: 03/17 0701 - 03/18 0700 In: 1780 [P.O.:480; I.V.:1200; IV Piggyback:100] Out: -  Intake/Output this shift:    Lab Results:  Basename 07/09/11 0554 07/08/11 0518  WBC 21.3* 24.0*  HGB 12.1 13.0  HCT 37.3 40.2  PLT 291 365   BMET  Basename 07/09/11 0554 07/08/11 0518  NA 138 139  K 3.0* 3.4*  CL 105 103  CO2 29 24  GLUCOSE 85 132*  BUN 17 20  CREATININE 0.59 0.54  CALCIUM 7.2* 7.0*    Studies/Results: No results found.  Medications: I have reviewed the patient's current medications.   Physical exam GENERAL- alert HEAD- normal atraumatic, no neck masses, normal thyroid, no jvd RESPIRATORY- appears well, vitals normal, no respiratory distress, acyanotic, normal RR, ear and throat exam is normal, neck free of mass or lymphadenopathy, chest clear, no wheezing, crepitations, rhonchi, normal symmetric air entry CVS- regular rate and rhythm, S1, S2 normal, no murmur, click, rub or gallop ABDOMEN- abdomen is soft without significant tenderness, masses, organomegaly or guarding NEURO- Grossly normal EXTREMITIES- extremities normal, atraumatic, no cyanosis or edema  Plan .Bronchitis/Healthcare-associated pneumonia/Chest pain/Leukocytosis- clinically better. wbc high better. ?steroid induced versus ongoing infection-?gastroenteritis. Continue current mx. .Diarrhea- stopped with interventions yesterday. Marland Kitchen GERD- watch off Pepcid for now, given diarrhea.  dvt prophylaxis- lovenox.  Hypokalemia- gi loss, causing weakness/malaise. Replenish. Condition remains closely guarded. Discussed plan of care with patient's sister.         [name]  07/09/2011 7:25 PM Pager: [phone].

## 2011-07-09 NOTE — Progress Notes (Signed)
ADMITTED W/SOB.READMIT.FROM HOME.UR COMPLETED.

## 2011-07-09 NOTE — Progress Notes (Signed)
ANTIBIOTIC CONSULT NOTE - Follow up  Pharmacy Consult for Vancomycin Indication: rule out pneumonia  Allergies  Allergen Reactions  . Kapidex (Dexlansoprazole)     Stomach pain, diarrhea  . Zegerid (Omeprazole)     Burning in chest    Patient Measurements: Height: 5' (152.4 cm) Weight: 175 lb 8 oz (79.606 kg) IBW/kg (Calculated) : 45.5   Vital Signs: Temp: 98 F (36.7 C) (03/18 1336) Temp src: Oral (03/18 1336) BP: 102/60 mmHg (03/18 1336) Pulse Rate: 74  (03/18 1336)  Labs:  Basename 07/09/11 0554 07/08/11 0518 07/07/11 0240  WBC 21.3* 24.0* 15.6*  HGB 12.1 13.0 13.8  PLT 291 365 378  LABCREA -- -- --  CREATININE 0.59 0.54 0.60   Estimated Creatinine Clearance: 58.4 ml/min (by C-G formula based on Cr of 0.59). No results found for this basename: VANCOTROUGH:2,VANCOPEAK:2,VANCORANDOM:2,GENTTROUGH:2,GENTPEAK:2,GENTRANDOM:2,TOBRATROUGH:2,TOBRAPEAK:2,TOBRARND:2,AMIKACINPEAK:2,AMIKACINTROU:2,AMIKACIN:2, in the last 72 hours   Medications:  Anti-infectives     Start     Dose/Rate Route Frequency Ordered Stop   07/08/11 0915   metroNIDAZOLE (FLAGYL) tablet 500 mg        500 mg Oral 3 times per day 07/08/11 0901     07/07/11 0600   vancomycin (VANCOCIN) 750 mg in sodium chloride 0.9 % 150 mL IVPB        750 mg 150 mL/hr over 60 Minutes Intravenous Every 12 hours 07/06/11 2128     07/06/11 2200   piperacillin-tazobactam (ZOSYN) IVPB 3.375 g        3.375 g 12.5 mL/hr over 240 Minutes Intravenous 3 times per day 07/06/11 2059 07/13/11 2159   07/06/11 2200   Levofloxacin (LEVAQUIN) IVPB 750 mg  Status:  Discontinued        750 mg 100 mL/hr over 90 Minutes Intravenous Every 24 hours 07/06/11 2059 07/08/11 0901   07/06/11 1715   vancomycin (VANCOCIN) IVPB 1000 mg/200 mL premix        1,000 mg 200 mL/hr over 60 Minutes Intravenous  Once 07/06/11 1701 07/06/11 2000   07/06/11 1715   piperacillin-tazobactam (ZOSYN) 3.375 g in dextrose 5 % 50 mL IVPB        3.375 g 100  mL/hr over 30 Minutes Intravenous  Once 07/06/11 1701 07/06/11 1812         Assessment:  76 yo F admit with suspected pneumonia  Day #4 Vancomycin   Flagyl and Zosyn ordered per MD - may be duplicate anaerobic therapy  Pt c/o diarrhea:  Cdiff negative, Stool culture NGTD  Renal function stable, CrCl ~ 58 ml/min  Goal of Therapy:  Vancomycin trough level 15-20 mcg/ml  Plan:   Vancomycin 750mg  IV Q12h  Vancomycin trough level today  Follow up culture results, renal function, and labs as available  ** Note:  MD to consider d/c metronidazole if not using for Cdiff (negative).  Zosyn will cover anaerobes.Lynann Beaver PharmD, BCPS Pager [phone] 07/09/2011 1:40 PM

## 2011-07-10 LAB — MAGNESIUM: Magnesium: 1.9 mg/dL (ref 1.5–2.5)

## 2011-07-10 LAB — COMPREHENSIVE METABOLIC PANEL
Albumin: 2.3 g/dL — ABNORMAL LOW (ref 3.5–5.2)
BUN: 10 mg/dL (ref 6–23)
CO2: 32 mEq/L (ref 19–32)
Calcium: 7.7 mg/dL — ABNORMAL LOW (ref 8.4–10.5)
Chloride: 102 mEq/L (ref 96–112)
Creatinine, Ser: 0.53 mg/dL (ref 0.50–1.10)
GFR calc non Af Amer: >90 mL/min (ref >90)
Total Bilirubin: 0.3 mg/dL (ref 0.3–1.2)

## 2011-07-10 LAB — CBC
HCT: 35.4 % — ABNORMAL LOW (ref 36.0–46.0)
MCH: 29.5 pg (ref 26.0–34.0)
MCV: 90.8 fL (ref 78.0–100.0)
Platelets: 272 10*3/uL (ref 150–400)
RDW: 14.1 % (ref 11.5–15.5)
WBC: 18 10*3/uL — ABNORMAL HIGH (ref 4.0–10.5)

## 2011-07-10 LAB — PHOSPHORUS: Phosphorus: 1.8 mg/dL — ABNORMAL LOW (ref 2.3–4.6)

## 2011-07-10 MED ORDER — DEXTROSE 5 % IV SOLN
1.0000 g | Freq: Three times a day (TID) | INTRAVENOUS | Status: DC
  Administered 2011-07-10 – 2011-07-12 (×5): 1 g via INTRAVENOUS
  Filled 2011-07-10 (×9): qty 1

## 2011-07-10 MED ORDER — FLUCONAZOLE 200 MG PO TABS
200.0000 mg | ORAL_TABLET | Freq: Once | ORAL | Status: AC
  Administered 2011-07-10: 200 mg via ORAL
  Filled 2011-07-10: qty 1

## 2011-07-10 MED ORDER — ALUM & MAG HYDROXIDE-SIMETH 200-200-20 MG/5ML PO SUSP
15.0000 mL | ORAL | Status: DC | PRN
  Administered 2011-07-10 – 2011-07-12 (×6): 15 mL via ORAL
  Filled 2011-07-10 (×6): qty 30

## 2011-07-10 MED ORDER — SUCRALFATE 1 G PO TABS
1.0000 g | ORAL_TABLET | Freq: Three times a day (TID) | ORAL | Status: DC
  Administered 2011-07-10 – 2011-07-13 (×11): 1 g via ORAL
  Filled 2011-07-10 (×13): qty 1

## 2011-07-10 MED ORDER — FLUCONAZOLE 100 MG PO TABS
100.0000 mg | ORAL_TABLET | Freq: Every day | ORAL | Status: DC
  Administered 2011-07-11: 100 mg via ORAL
  Filled 2011-07-10: qty 1

## 2011-07-10 NOTE — Progress Notes (Signed)
[name] is a 76 y.o. female patient admitted on 07/06/11, having just been discharged after treatment for COPD exacerbation. She represented with SOB and cough, with some diarrhea. She was found to have left side PNA. Her wbc was 57846. The wbc has not changed much since the 8th of much, despite abx for bacteria/c.diff, and at a point where viral infection should be showing improvement. I have added fluconazole empirically today. Patient refused CT chest. If wbc remained high, and she was not showing significant improvement, I would consider pulmonary consult ?FOB, vs ID. Patient has had diarrhea, which improved for a day, but seems to be back, despite imodium. C.diff pcr was negative. Have changed her abx to vanc/cefepime/flagyl/fluconazole. She believes levaquin caused the diarrhea, and I have since stopped it.  SUBJECTIVE 7 bowel movements today.   1. Dehydration   2. Chest pain   3. Healthcare-associated pneumonia   4. Other dyspnea and respiratory abnormality     Past Medical History  Diagnosis Date  . COPD (chronic obstructive pulmonary disease)   . Asthma   . GERD (gastroesophageal reflux disease)   . PONV (postoperative nausea and vomiting)    Current Facility-Administered Medications  Medication Dose Route Frequency Provider Last Rate Last Dose  . acetaminophen (TYLENOL) tablet 650 mg  650 mg Oral Q6H PRN Jayren Cease, MD       Or  . acetaminophen (TYLENOL) suppository 650 mg  650 mg Rectal Q6H PRN Grenda Lora, MD      . ALPRAZolam Prudy Feeler) tablet 0.25 mg  0.25 mg Oral QHS PRN Wadie Mattie, MD      . alum & mag hydroxide-simeth (MAALOX/MYLANTA) 200-200-20 MG/5ML suspension 15 mL  15 mL Oral Q4H PRN Tayvien Kane, MD   15 mL at 07/10/11 1737  . enoxaparin (LOVENOX) injection 40 mg  40 mg Subcutaneous Q24H Khalil Belote, MD   40 mg at 07/10/11 0938  . gabapentin (NEURONTIN) capsule 300 mg  300 mg Oral QHS Elgar Scoggins, MD   300 mg at 07/09/11 2310  . guaiFENesin  (MUCINEX) 12 hr tablet 600 mg  600 mg Oral BID Tamma Brigandi, MD   600 mg at 07/10/11 0938  . HYDROcodone-acetaminophen (NORCO) 5-325 MG per tablet 1-2 tablet  1-2 tablet Oral Q4H PRN Conley Canal, MD   2 tablet at 07/10/11 9629  . lactobacillus (FLORANEX/LACTINEX) granules 1 g  1 g Oral TID WC Loney Domingo, MD   1 g at 07/10/11 1735  . levalbuterol (XOPENEX) nebulizer solution 0.63 mg  0.63 mg Nebulization Q2H PRN Zoe Goonan, MD      . levalbuterol (XOPENEX) nebulizer solution 1.25 mg  1.25 mg Nebulization Q6H WA Paralee Pendergrass, MD   1.25 mg at 07/10/11 2039  . loperamide (IMODIUM) capsule 4 mg  4 mg Oral PRN Kennetta Pavlovic, MD   4 mg at 07/10/11 2015  . LORazepam (ATIVAN) injection 0.5 mg  0.5 mg Intravenous Once Stephanye Finnicum, MD      . metroNIDAZOLE (FLAGYL) tablet 500 mg  500 mg Oral Q8H Amilio Zehnder, MD   500 mg at 07/10/11 1420  . morphine 2 MG/ML injection 2 mg  2 mg Intravenous Q4H PRN Vallie Fayette, MD      . nicotine (NICODERM CQ - dosed in mg/24 hours) patch 14 mg  14 mg Transdermal Daily Lanaiya Lantry, MD   14 mg at 07/10/11 0938  . ondansetron (ZOFRAN) tablet 4 mg  4 mg Oral Q6H PRN Conley Canal, MD   4  mg at 07/09/11 1520   Or  . ondansetron (ZOFRAN) injection 4 mg  4 mg Intravenous Q6H PRN Joao Mccurdy, MD      . potassium chloride SA (K-DUR,KLOR-CON) CR tablet 40 mEq  40 mEq Oral Once Annessa Satre, MD   40 mEq at 07/09/11 2311  . predniSONE (DELTASONE) tablet 40 mg  40 mg Oral Q breakfast Zillah Alexie, MD   40 mg at 07/10/11 0938  . sodium chloride 0.9 % injection 3 mL  3 mL Intravenous Q12H Hernando Reali, MD   3 mL at 07/08/11 0952  . sucralfate (CARAFATE) tablet 1 g  1 g Oral TID WC & HS Delvonte Berenson, MD      . tiotropium (SPIRIVA) inhalation capsule 18 mcg  18 mcg Inhalation Daily Davian Wollenberg, MD   18 mcg at 07/10/11 0909  . vancomycin (VANCOCIN) 1,250 mg in sodium chloride 0.9 % 250 mL IVPB  1,250 mg Intravenous Q12H Loma Messing [place], PHARMD   1,250 mg at  07/10/11 1421  . zolpidem (AMBIEN) tablet 5 mg  5 mg Oral QHS PRN Jinger Neighbors, NP   5 mg at 07/09/11 2320  . DISCONTD: piperacillin-tazobactam (ZOSYN) IVPB 3.375 g  3.375 g Intravenous Q8H Annia Belt, PHARMD   3.375 g at 07/10/11 1421   Allergies  Allergen Reactions  . Kapidex (Dexlansoprazole)     Stomach pain, diarrhea  . Zegerid (Omeprazole)     Burning in chest   Active Problems:  Bronchitis  Healthcare-associated pneumonia  Chest pain  Leukocytosis   Vital signs in last 24 hours: Temp:  [97.7 F (36.5 C)-98.3 F (36.8 C)] 98 F (36.7 C) (03/19 2142) Pulse Rate:  [67-107] 80  (03/19 2142) Resp:  [18] 18  (03/19 2142) BP: (99-111)/(62-67) 110/64 mmHg (03/19 2142) SpO2:  [95 %-97 %] 95 % (03/19 2142) Weight:  [82 kg (180 lb 12.4 oz)] 82 kg (180 lb 12.4 oz) (03/19 0451) Weight change: 2.394 kg (5 lb 4.4 oz) Last BM Date: 07/08/11  Intake/Output from previous day: 03/18 0701 - 03/19 0700 In: 1280 [P.O.:780; IV Piggyback:500] Out: -  Intake/Output this shift:    Lab Results:  Basename 07/10/11 0514 07/09/11 0554  WBC 18.0* 21.3*  HGB 11.5* 12.1  HCT 35.4* 37.3  PLT 272 291   BMET  Basename 07/10/11 0514 07/09/11 0554  NA 137 138  K 4.0 3.0*  CL 102 105  CO2 32 29  GLUCOSE 92 85  BUN 10 17  CREATININE 0.53 0.59  CALCIUM 7.7* 7.2*    Studies/Results: No results found.  Medications: I have reviewed the patient's current medications.   Physical exam GENERAL- alert HEAD- normal atraumatic, no neck masses, normal thyroid, no jvd RESPIRATORY- appears well, vitals normal, no respiratory distress, acyanotic, normal RR, ear and throat exam is normal, neck free of mass or lymphadenopathy, chest clear, no wheezing, crepitations, rhonchi, normal symmetric air entry CVS- regular rate and rhythm, S1, S2 normal, no murmur, click, rub or gallop ABDOMEN- abdomen is soft without significant tenderness, masses, organomegaly or guarding NEURO- Grossly  normal EXTREMITIES- extremities normal, atraumatic, no cyanosis or edema  Plan .Bronchitis/Healthcare-associated pneumonia/Chest pain/Leukocytosis- slow improvement. Follow urine legionella antigen. Folow cxr. Consider pulmonary if no improvement. .Diarrhea- on and off. Imodium as needed. Marland Kitchen GERD- watch off Pepcid for now, given diarrhea.  dvt prophylaxis- lovenox.  Hypokalemia- gi loss, causing weakness/malaise. Replenish.  Condition remains closely guarded.         Iline Buchinger 07/10/2011 9:55 PM Pager:  [phone].    

## 2011-07-10 NOTE — Progress Notes (Signed)
ANTIBIOTIC CONSULT NOTE - INITIAL  Pharmacy Consult for Cefepime Indication: pneumonia  Allergies  Allergen Reactions  . Kapidex (Dexlansoprazole)     Stomach pain, diarrhea  . Zegerid (Omeprazole)     Burning in chest    Patient Measurements: Height: 5' (152.4 cm) Weight: 180 lb 12.4 oz (82 kg) IBW/kg (Calculated) : 45.5   Vital Signs: Temp: 98 F (36.7 C) (03/19 2142) Temp src: Oral (03/19 2142) BP: 110/64 mmHg (03/19 2142) Pulse Rate: 80  (03/19 2142)  Labs:  Basename 07/10/11 0514 07/09/11 0554 07/08/11 0518  WBC 18.0* 21.3* 24.0*  HGB 11.5* 12.1 13.0  PLT 272 291 365  LABCREA -- -- --  CREATININE 0.53 0.59 0.54   Estimated Creatinine Clearance: 59.4 ml/min (by C-G formula based on Cr of 0.53).  Basename 07/09/11 1735  VANCOTROUGH 8.0*  VANCOPEAK --  Drue Dun --  GENTTROUGH --  GENTPEAK --  GENTRANDOM --  TOBRATROUGH --  TOBRAPEAK --  TOBRARND --  AMIKACINPEAK --  AMIKACINTROU --  AMIKACIN --     Microbiology: Recent Results (from the past 720 hour(s))  CLOSTRIDIUM DIFFICILE BY PCR     Status: Normal   Collection Time   07/07/11  1:44 PM      Component Value Range Status Comment   C difficile by pcr NEGATIVE  NEGATIVE  Final   STOOL CULTURE     Status: Normal   Collection Time   07/07/11  1:44 PM      Component Value Range Status Comment   Specimen Description STOOL   Final    Special Requests NONE   Final    Culture     Final    Value: NO SALMONELLA, SHIGELLA, CAMPYLOBACTER, OR YERSINIA ISOLATED     Note: REDUCED NORMAL FLORA PRESENT   Report Status 07/10/2011 FINAL   Final     Medical History: Past Medical History  Diagnosis Date  . COPD (chronic obstructive pulmonary disease)   . Asthma   . GERD (gastroesophageal reflux disease)   . PONV (postoperative nausea and vomiting)    Assessment: 75yof on day #5 vancomycin/Zosyn for r/o HCAP and day #3 Flagyl for r/o gastroenterisis.  Now switching Zosyn to cefepime to avoid duplicate  anaerobe coverage with Flagyl.  SCr 0.53, CrCl~54ml/min (CG, total body weight).  Goal of Therapy:  Doses adjusted per renal clearance  Plan:  Cefepime 1g IV q8h (HCAP dosing). F/u SCr   Clance Boll 07/10/2011,10:18 PM

## 2011-07-11 ENCOUNTER — Inpatient Hospital Stay (HOSPITAL_COMMUNITY): Payer: Medicare Other

## 2011-07-11 LAB — DIFFERENTIAL
Basophils Absolute: 0 10*3/uL (ref 0.0–0.1)
Basophils Relative: 0 % (ref 0–1)
Lymphocytes Relative: 10 % — ABNORMAL LOW (ref 12–46)
Neutro Abs: 14.8 10*3/uL — ABNORMAL HIGH (ref 1.7–7.7)
Neutrophils Relative %: 83 % — ABNORMAL HIGH (ref 43–77)

## 2011-07-11 LAB — COMPREHENSIVE METABOLIC PANEL
Calcium: 8.1 mg/dL — ABNORMAL LOW (ref 8.4–10.5)
Chloride: 98 mEq/L (ref 96–112)
GFR calc Af Amer: >90 mL/min (ref >90)
GFR calc non Af Amer: >90 mL/min (ref >90)
Potassium: 4.5 mEq/L (ref 3.5–5.1)
Sodium: 137 mEq/L (ref 135–145)
Total Protein: 4.7 g/dL — ABNORMAL LOW (ref 6.0–8.3)

## 2011-07-11 LAB — CBC
MCHC: 32.5 g/dL (ref 30.0–36.0)
RDW: 14 % (ref 11.5–15.5)
WBC: 17.9 10*3/uL — ABNORMAL HIGH (ref 4.0–10.5)

## 2011-07-11 LAB — LEGIONELLA ANTIGEN, URINE: Legionella Antigen, Urine: NEGATIVE

## 2011-07-11 MED ORDER — FLUCONAZOLE 200 MG PO TABS
400.0000 mg | ORAL_TABLET | Freq: Every day | ORAL | Status: DC
  Filled 2011-07-11: qty 2

## 2011-07-11 NOTE — Progress Notes (Signed)
Subjective: Reports "feeling better and diarrhea has slowed down".   Objective: Vital signs Filed Vitals:   07/10/11 2040 07/10/11 2142 07/11/11 0500 07/11/11 0603  BP:  110/64  104/66  Pulse:  80  95  Temp:  98 F (36.7 C)  97.8 F (36.6 C)  TempSrc:  Oral  Oral  Resp:  18  18  Height:      Weight:   80.5 kg (177 lb 7.5 oz)   SpO2: 96% 95%  94%   Weight change: -1.5 kg (-3 lb 4.9 oz) Last BM Date: 07/10/11  Intake/Output from previous day: 03/19 0701 - 03/20 0700 In: 1130 [P.O.:480; IV Piggyback:650] Out: -      Physical Exam: General: Alert, awake, oriented x3, in no acute distress.  HEENT: No bruits, no goiter. Mucus membranes moist/sligtly pale Heart: Regular rate and rhythm, without murmurs, rubs, gallops. No LEE Lungs: Normal effort. Breath sounds very distant but otherwise clear to auscultation bilaterally. No wheeze Abdomen:Obese Soft, nontender, nondistended, positive bowel sounds. Extremities: No clubbing cyanosis or edema with positive pedal pulses.MOE full rom. Neuro: Grossly intact, nonfocal. Speech clear.     Lab Results: Basic Metabolic Panel:  Basename 07/11/11 0448 07/10/11 0514 07/09/11 0554  NA 137 137 --  K 4.5 4.0 --  CL 98 102 --  CO2 37* 32 --  GLUCOSE 92 92 --  BUN 9 10 --  CREATININE 0.52 0.53 --  CALCIUM 8.1* 7.7* --  MG -- 1.9 2.1  PHOS -- 1.8* 2.0*   Liver Function Tests:  Basename 07/11/11 0448 07/10/11 0514  AST 28 21  ALT 51* 45*  ALKPHOS 47 44  BILITOT 0.3 0.3  PROT 4.7* 4.5*  ALBUMIN 2.5* 2.3*   No results found for this basename: LIPASE:2,AMYLASE:2 in the last 72 hours No results found for this basename: AMMONIA:2 in the last 72 hours CBC:  Basename 07/11/11 0448 07/10/11 0514  WBC 17.9* 18.0*  NEUTROABS 14.8* --  HGB 12.0 11.5*  HCT 36.9 35.4*  MCV 90.4 90.8  PLT 288 272   Cardiac Enzymes: No results found for this basename: CKTOTAL:3,CKMB:3,CKMBINDEX:3,TROPONINI:3 in the last 72 hours BNP: No results  found for this basename: PROBNP:3 in the last 72 hours D-Dimer: No results found for this basename: DDIMER:2 in the last 72 hours CBG: No results found for this basename: GLUCAP:6 in the last 72 hours Hemoglobin A1C: No results found for this basename: HGBA1C in the last 72 hours Fasting Lipid Panel: No results found for this basename: CHOL,HDL,LDLCALC,TRIG,CHOLHDL,LDLDIRECT in the last 72 hours Thyroid Function Tests: No results found for this basename: TSH,T4TOTAL,FREET4,T3FREE,THYROIDAB in the last 72 hours Anemia Panel: No results found for this basename: VITAMINB12,FOLATE,FERRITIN,TIBC,IRON,RETICCTPCT in the last 72 hours Coagulation: No results found for this basename: LABPROT:2,INR:2 in the last 72 hours Urine Drug Screen: Drugs of Abuse  No results found for this basename: labopia, cocainscrnur, labbenz, amphetmu, thcu, labbarb    Alcohol Level: No results found for this basename: ETH:2 in the last 72 hours Urinalysis: No results found for this basename: COLORURINE:2,APPERANCEUR:2,LABSPEC:2,PHURINE:2,GLUCOSEU:2,HGBUR:2,BILIRUBINUR:2,KETONESUR:2,PROTEINUR:2,UROBILINOGEN:2,NITRITE:2,LEUKOCYTESUR:2 in the last 72 hours Misc. Labs:  Recent Results (from the past 240 hour(s))  CLOSTRIDIUM DIFFICILE BY PCR     Status: Normal   Collection Time   07/07/11  1:44 PM      Component Value Range Status Comment   C difficile by pcr NEGATIVE  NEGATIVE  Final   STOOL CULTURE     Status: Normal   Collection Time   07/07/11  1:44 PM  Component Value Range Status Comment   Specimen Description STOOL   Final    Special Requests NONE   Final    Culture     Final    Value: NO SALMONELLA, SHIGELLA, CAMPYLOBACTER, OR YERSINIA ISOLATED     Note: REDUCED NORMAL FLORA PRESENT   Report Status 07/10/2011 FINAL   Final     Studies/Results: No results found.  Medications: Scheduled Meds:   . ceFEPime (MAXIPIME) IV  1 g Intravenous Q8H  . enoxaparin (LOVENOX) injection  40 mg Subcutaneous  Q24H  . fluconazole  100 mg Oral Daily  . fluconazole  200 mg Oral Once  . gabapentin  300 mg Oral QHS  . guaiFENesin  600 mg Oral BID  . lactobacillus  1 g Oral TID WC  . levalbuterol  1.25 mg Nebulization Q6H WA  . LORazepam  0.5 mg Intravenous Once  . metroNIDAZOLE  500 mg Oral Q8H  . nicotine  14 mg Transdermal Daily  . predniSONE  40 mg Oral Q breakfast  . sodium chloride  3 mL Intravenous Q12H  . sucralfate  1 g Oral TID WC & HS  . tiotropium  18 mcg Inhalation Daily  . vancomycin  1,250 mg Intravenous Q12H  . DISCONTD: piperacillin-tazobactam  3.375 g Intravenous Q8H   Continuous Infusions:  PRN Meds:.acetaminophen, acetaminophen, ALPRAZolam, alum & mag hydroxide-simeth, HYDROcodone-acetaminophen, levalbuterol, loperamide, morphine, ondansetron (ZOFRAN) IV, ondansetron, zolpidem  Assessment/Plan:  Active Problems:  Bronchitis  Healthcare-associated pneumonia  Chest pain  Leukocytosis 1. Bronchitis/HCAP/CP/leukocytosis: continues with slow improvement. Vanc day #6, cefepine day #2 after 5 days zosyn that was changed per pharm. Pt reports breathing easier. Is usually on 2L at home. Will attempt to wean to 2L monitoring sats. Will ambulate with O2 and check sats. Afebrile, non-toxic appearing. CP resolved and reports upper back pain mopstly. Refused Chest CT yesterday. Legionell in process. Sats 94-97 on 3L. Repeat chest xray results pending. Continue prednisone. Consider pulm consult pending xray results?  2. Diarrhea: improved in frequency and amount. Only 2 stools during night. Cdiff neg. Flagyl day #4  3. Hypokalemia: secondary #2. Resolved. Monitor  4. GERD: holding pepcid secondary #2. At baseline. Continue to monitor.   5. COPD: see #1. On o2 at 2L at home. Continue nebs and prednisone. Monitor    LOS: 5 days   [facility] M 07/11/2011, 8:49 AM

## 2011-07-11 NOTE — Progress Notes (Signed)
Patient seen and examined. Data reviewed. Continues with subjective improvement. Exam unremarkable. Patient appears comfortable. Persistent leukocytosis may be related to steroids. As the patient is clinically improving would not make any changes to current therapy.  No compelling reason to continue Flagyl. Doubt fungal infection. Consider discontinuing Diflucan March 21. Wean steroids.  I agree with exam, assessment and plan.

## 2011-07-11 NOTE — Progress Notes (Signed)
Pt ambulated in hallway with o2 @ 2L. o2 stat was between 90-94%. Pt states that she wears 2L o2 at home. Pt gait unsteady and she complained of dizziness with ambulation. [name] A

## 2011-07-12 LAB — BASIC METABOLIC PANEL
CO2: 38 mEq/L — ABNORMAL HIGH (ref 19–32)
Calcium: 8.7 mg/dL (ref 8.4–10.5)
Chloride: 93 mEq/L — ABNORMAL LOW (ref 96–112)
Creatinine, Ser: 0.56 mg/dL (ref 0.50–1.10)
GFR calc Af Amer: >90 mL/min (ref >90)
GFR calc non Af Amer: >90 mL/min (ref >90)
Sodium: 136 mEq/L (ref 135–145)

## 2011-07-12 LAB — CBC
Hemoglobin: 11.8 g/dL — ABNORMAL LOW (ref 12.0–15.0)
Platelets: 309 10*3/uL (ref 150–400)
RBC: 4.02 MIL/uL (ref 3.87–5.11)
WBC: 17.8 10*3/uL — ABNORMAL HIGH (ref 4.0–10.5)

## 2011-07-12 MED ORDER — LEVOFLOXACIN 750 MG PO TABS
750.0000 mg | ORAL_TABLET | Freq: Every day | ORAL | Status: DC
  Administered 2011-07-12 – 2011-07-13 (×2): 750 mg via ORAL
  Filled 2011-07-12 (×3): qty 1

## 2011-07-12 MED ORDER — LEVALBUTEROL HCL 1.25 MG/0.5ML IN NEBU
1.2500 mg | INHALATION_SOLUTION | Freq: Three times a day (TID) | RESPIRATORY_TRACT | Status: DC
  Administered 2011-07-13: 1.25 mg via RESPIRATORY_TRACT
  Filled 2011-07-12 (×3): qty 0.5

## 2011-07-12 MED ORDER — PREDNISONE 20 MG PO TABS
30.0000 mg | ORAL_TABLET | Freq: Every day | ORAL | Status: DC
  Administered 2011-07-12: 30 mg via ORAL
  Filled 2011-07-12: qty 1

## 2011-07-12 MED ORDER — PREDNISONE 20 MG PO TABS
20.0000 mg | ORAL_TABLET | Freq: Every day | ORAL | Status: DC
  Administered 2011-07-13: 20 mg via ORAL
  Filled 2011-07-12: qty 1

## 2011-07-12 MED ORDER — LEVALBUTEROL HCL 0.63 MG/3ML IN NEBU
0.6300 mg | INHALATION_SOLUTION | RESPIRATORY_TRACT | Status: DC | PRN
  Filled 2011-07-12: qty 3

## 2011-07-12 NOTE — Progress Notes (Signed)
76 YO F on D#7 Vanc 1250 mg q12h / D#3 Cefepime 1 gram q8h for HCAP.  Agree with Pharmacy Student assessment and plan. Vanco trough at 13:30 today, goal 15-20.  Darrol Angel, PharmD Pager: [phone] 07/12/2011. 10:29 AM

## 2011-07-12 NOTE — Progress Notes (Signed)
   CARE MANAGEMENT NOTE 07/12/2011  Patient:  [name], [name]   Account Number:  [id number]  Date Initiated:  07/09/2011  Documentation initiated by:  [facility]  Subjective/Objective Assessment:   ADMITTED W/SOB.READMIT.     Action/Plan:   FROM HOME   Anticipated DC Date:  07/13/2011   Anticipated DC Plan:  HOME W HOME HEALTH SERVICES         Choice offered to / List presented to:  C-1 Patient           Status of service:  In process, will continue to follow Medicare Important Message given?   (If response is "NO", the following Medicare IM given date fields will be blank) Date Medicare IM given:   Date Additional Medicare IM given:    Discharge Disposition:    Per UR Regulation:  Reviewed for med. necessity/level of care/duration of stay  If discussed at Long Length of Stay Meetings, dates discussed:    Comments:  07/12/11 Sage Hammill RN,BSN NCM [phone] WILL STAY W/[facility] @ D/C:[address] [place] [address]([place]C#[phone],THEN RETURN HOME([place]),HAS USED [facility] IN PAST BUT WILL THINK ABOUT HHC SINCE LAST TIME IT WAS DIFFICULT W/HER STAYING IN  COUNTY,THEN GOING TO [place] USING DIFFERENT PHYSICAL THERAPISTS. PT-HH.[facility]HC AGENCY LIST GIVEN.RECOMMEND HHPT ORDER IF MD AGREE. ALREADY USES [facility] 02. 07/09/11 Cyndia Degraff RN,BSN NCM [phone]

## 2011-07-12 NOTE — Progress Notes (Signed)
Subjective: Lying in bed awakens to light touch. Reports feeling "better" . Reports continued diarrhea but less and stool more formed. Appetite improving as well  Objective: Vital signs Filed Vitals:   07/11/11 1434 07/11/11 2037 07/11/11 2152 07/12/11 0608  BP: 109/61  92/60 121/67  Pulse: 74  70 63  Temp: 97.5 F (36.4 C)  99.1 F (37.3 C) 98.3 F (36.8 C)  TempSrc: Oral  Oral Oral  Resp: 18  20 18   Height:      Weight:    79.289 kg (174 lb 12.8 oz)  SpO2: 93% 93% 94% 97%   Weight change: -1.211 kg (-2 lb 10.7 oz) Last BM Date: 07/11/11  Intake/Output from previous day: 03/20 0701 - 03/21 0700 In: 780 [P.O.:480; IV Piggyback:300] Out: 1325 [Urine:1325] Total I/O In: 3 [I.V.:3] Out: -    Physical Exam: General: Alert, awake, oriented x3, in no acute distress. HEENT: No bruits, no goiter. PERRL Heart: Regular rate and rhythm, without murmurs, rubs, gallops. No LEE Lungs: Normal effort. Breath sounds clear to auscultation bilaterally. No wheeze Abdomen: Soft, nontender, nondistended, positive bowel sounds. Extremities: No clubbing cyanosis or edema with positive pedal pulses. Neuro: Grossly intact, nonfocal.    Lab Results: Basic Metabolic Panel:  Basename 07/12/11 0454 07/11/11 0448 07/10/11 0514  NA 136 137 --  K 3.8 4.5 --  CL 93* 98 --  CO2 38* 37* --  GLUCOSE 120* 92 --  BUN 12 9 --  CREATININE 0.56 0.52 --  CALCIUM 8.7 8.1* --  MG -- -- 1.9  PHOS -- -- 1.8*   Liver Function Tests:  Basename 07/11/11 0448 07/10/11 0514  AST 28 21  ALT 51* 45*  ALKPHOS 47 44  BILITOT 0.3 0.3  PROT 4.7* 4.5*  ALBUMIN 2.5* 2.3*   No results found for this basename: LIPASE:2,AMYLASE:2 in the last 72 hours No results found for this basename: AMMONIA:2 in the last 72 hours CBC:  Basename 07/12/11 0454 07/11/11 0448  WBC 17.8* 17.9*  NEUTROABS -- 14.8*  HGB 11.8* 12.0  HCT 36.4 36.9  MCV 90.5 90.4  PLT 309 288   Cardiac Enzymes: No results found for this  basename: CKTOTAL:3,CKMB:3,CKMBINDEX:3,TROPONINI:3 in the last 72 hours BNP: No results found for this basename: PROBNP:3 in the last 72 hours D-Dimer: No results found for this basename: DDIMER:2 in the last 72 hours CBG: No results found for this basename: GLUCAP:6 in the last 72 hours Hemoglobin A1C: No results found for this basename: HGBA1C in the last 72 hours Fasting Lipid Panel: No results found for this basename: CHOL,HDL,LDLCALC,TRIG,CHOLHDL,LDLDIRECT in the last 72 hours Thyroid Function Tests: No results found for this basename: TSH,T4TOTAL,FREET4,T3FREE,THYROIDAB in the last 72 hours Anemia Panel: No results found for this basename: VITAMINB12,FOLATE,FERRITIN,TIBC,IRON,RETICCTPCT in the last 72 hours Coagulation: No results found for this basename: LABPROT:2,INR:2 in the last 72 hours Urine Drug Screen: Drugs of Abuse  No results found for this basename: labopia, cocainscrnur, labbenz, amphetmu, thcu, labbarb    Alcohol Level: No results found for this basename: ETH:2 in the last 72 hours Urinalysis: No results found for this basename: COLORURINE:2,APPERANCEUR:2,LABSPEC:2,PHURINE:2,GLUCOSEU:2,HGBUR:2,BILIRUBINUR:2,KETONESUR:2,PROTEINUR:2,UROBILINOGEN:2,NITRITE:2,LEUKOCYTESUR:2 in the last 72 hours Misc. Labs:  Recent Results (from the past 240 hour(s))  CLOSTRIDIUM DIFFICILE BY PCR     Status: Normal   Collection Time   07/07/11  1:44 PM      Component Value Range Status Comment   C difficile by pcr NEGATIVE  NEGATIVE  Final   STOOL CULTURE     Status:  Normal   Collection Time   07/07/11  1:44 PM      Component Value Range Status Comment   Specimen Description STOOL   Final    Special Requests NONE   Final    Culture     Final    Value: NO SALMONELLA, SHIGELLA, CAMPYLOBACTER, OR YERSINIA ISOLATED     Note: REDUCED NORMAL FLORA PRESENT   Report Status 07/10/2011 FINAL   Final     Studies/Results: Dg Chest 2 View  07/11/2011  *RADIOLOGY REPORT*  Clinical Data:  76 year old female with chest burning.  CHEST - 2 VIEW  Comparison: 07/06/2011 and earlier.  Findings: New / increased bilateral pleural effusions, greater on the left - moderate. Normal cardiac size and mediastinal contours. No pneumothorax or pulmonary edema.  The left pleural fluid obscures the recent area of air space opacity.  Elsewhere lung parenchyma is within normal limits. Visualized tracheal air column is within normal limits.  Stable visualized osseous structures. Right upper quadrant surgical clips.  IMPRESSION: Moderate left and small right pleural effusions now present.  The left effusion obscures the recently seen patchy airspace disease.  Original Report Authenticated By: Harley Hallmark, M.D.    Medications: Scheduled Meds:   . enoxaparin (LOVENOX) injection  40 mg Subcutaneous Q24H  . gabapentin  300 mg Oral QHS  . guaiFENesin  600 mg Oral BID  . lactobacillus  1 g Oral TID WC  . levalbuterol  1.25 mg Nebulization Q6H WA  . levofloxacin  750 mg Oral Daily  . LORazepam  0.5 mg Intravenous Once  . nicotine  14 mg Transdermal Daily  . predniSONE  20 mg Oral Q breakfast  . sodium chloride  3 mL Intravenous Q12H  . sucralfate  1 g Oral TID WC & HS  . tiotropium  18 mcg Inhalation Daily  . DISCONTD: ceFEPime (MAXIPIME) IV  1 g Intravenous Q8H  . DISCONTD: fluconazole  100 mg Oral Daily  . DISCONTD: fluconazole  400 mg Oral Daily  . DISCONTD: metroNIDAZOLE  500 mg Oral Q8H  . DISCONTD: predniSONE  30 mg Oral Q breakfast  . DISCONTD: predniSONE  40 mg Oral Q breakfast  . DISCONTD: vancomycin  1,250 mg Intravenous Q12H   Continuous Infusions:  PRN Meds:.acetaminophen, acetaminophen, ALPRAZolam, alum & mag hydroxide-simeth, HYDROcodone-acetaminophen, levalbuterol, loperamide, morphine, ondansetron (ZOFRAN) IV, ondansetron, zolpidem  Assessment/Plan:  Active Problems:  Bronchitis  Healthcare-associated pneumonia  Chest pain  Leukocytosis 1. Bronchitis/HCAP/CP/leukocytosis:  Improved. Leukocytosis likely related to steroids. Will discontinue IV antibiotics and start Levaquin. If diarrhea worsens will re-evaluate.. Sats with ambulation >90%. Afebrile, non-toxic appearing. CP resolved.ay. Legionell in process. Taper prednisone.  2. Diarrhea: improved in frequency and amount. Only 2 stools during night. Cdiff neg.  3. Hypokalemia: secondary #2. Resolved. Monitor  4. GERD: holding pepcid secondary #2. At baseline. Continue to monitor.  5. COPD: see #1. On o2 at 2L at home. Continue nebs and prednisone. Monitor 6. Dispo: home hopefully 24hrs or so 7. Code status: full code    LOS: 6 days   [facility] M 07/12/2011, 11:32 AM

## 2011-07-12 NOTE — Progress Notes (Signed)
ANTIBIOTIC CONSULT NOTE - FOLLOW UP  Pharmacy Consult for Cefepime/Vanc Indication: r/o pneumonia  Allergies  Allergen Reactions  . Kapidex (Dexlansoprazole)     Stomach pain, diarrhea  . Zegerid (Omeprazole)     Burning in chest    Patient Measurements: Height: 5' (152.4 cm) Weight: 174 lb 12.8 oz (79.289 kg) IBW/kg (Calculated) : 45.5   Vital Signs: Temp: 98.3 F (36.8 C) (03/21 1610) Temp src: Oral (03/21 0608) BP: 121/67 mmHg (03/21 9604) Pulse Rate: 63  (03/21 0608) Intake/Output from previous day: 03/20 0701 - 03/21 0700 In: 780 [P.O.:480; IV Piggyback:300] Out: 1325 [Urine:1325] Intake/Output from this shift:    Labs:  Basename 07/12/11 0454 07/11/11 0448 07/10/11 0514  WBC 17.8* 17.9* 18.0*  HGB 11.8* 12.0 11.5*  PLT 309 288 272  LABCREA -- -- --  CREATININE 0.56 0.52 0.53   Estimated Creatinine Clearance: 58.3 ml/min (by C-G formula based on Cr of 0.56).  Basename 07/09/11 1735  VANCOTROUGH 8.0*  VANCOPEAK --  Drue Dun --  GENTTROUGH --  GENTPEAK --  GENTRANDOM --  TOBRATROUGH --  TOBRAPEAK --  TOBRARND --  AMIKACINPEAK --  AMIKACINTROU --  AMIKACIN --     Microbiology: Recent Results (from the past 720 hour(s))  CLOSTRIDIUM DIFFICILE BY PCR     Status: Normal   Collection Time   07/07/11  1:44 PM      Component Value Range Status Comment   C difficile by pcr NEGATIVE  NEGATIVE  Final   STOOL CULTURE     Status: Normal   Collection Time   07/07/11  1:44 PM      Component Value Range Status Comment   Specimen Description STOOL   Final    Special Requests NONE   Final    Culture     Final    Value: NO SALMONELLA, SHIGELLA, CAMPYLOBACTER, OR YERSINIA ISOLATED     Note: REDUCED NORMAL FLORA PRESENT   Report Status 07/10/2011 FINAL   Final     Anti-infectives     Start     Dose/Rate Route Frequency Ordered Stop   07/12/11 1000   fluconazole (DIFLUCAN) tablet 400 mg  Status:  Discontinued        400 mg Oral Daily 07/11/11 1312  07/12/11 0652   07/11/11 1000   fluconazole (DIFLUCAN) tablet 100 mg  Status:  Discontinued        100 mg Oral Daily 07/10/11 2159 07/11/11 1312   07/10/11 2300   ceFEPIme (MAXIPIME) 1 g in dextrose 5 % 50 mL IVPB        1 g 100 mL/hr over 30 Minutes Intravenous 3 times per day 07/10/11 2226     07/10/11 2230   fluconazole (DIFLUCAN) tablet 200 mg        200 mg Oral  Once 07/10/11 2159 07/10/11 2313   07/10/11 0200   vancomycin (VANCOCIN) 1,250 mg in sodium chloride 0.9 % 250 mL IVPB        1,250 mg 166.7 mL/hr over 90 Minutes Intravenous Every 12 hours 07/09/11 2001     07/08/11 0915   metroNIDAZOLE (FLAGYL) tablet 500 mg  Status:  Discontinued        500 mg Oral 3 times per day 07/08/11 0901 07/11/11 1942   07/07/11 0600   vancomycin (VANCOCIN) 750 mg in sodium chloride 0.9 % 150 mL IVPB  Status:  Discontinued        750 mg 150 mL/hr over 60 Minutes Intravenous Every 12 hours  07/06/11 2128 07/09/11 2001   07/06/11 2200   piperacillin-tazobactam (ZOSYN) IVPB 3.375 g  Status:  Discontinued        3.375 g 12.5 mL/hr over 240 Minutes Intravenous 3 times per day 07/06/11 2059 07/10/11 2150   07/06/11 2200   Levofloxacin (LEVAQUIN) IVPB 750 mg  Status:  Discontinued        750 mg 100 mL/hr over 90 Minutes Intravenous Every 24 hours 07/06/11 2059 07/08/11 0901   07/06/11 1715   vancomycin (VANCOCIN) IVPB 1000 mg/200 mL premix        1,000 mg 200 mL/hr over 60 Minutes Intravenous  Once 07/06/11 1701 07/06/11 2000   07/06/11 1715  piperacillin-tazobactam (ZOSYN) 3.375 g in dextrose 5 % 50 mL IVPB       3.375 g 100 mL/hr over 30 Minutes Intravenous  Once 07/06/11 1701 07/06/11 1812        Medical History:  Past Medical History   Diagnosis  Date   .  COPD (chronic obstructive pulmonary disease)    .  Asthma    .  GERD (gastroesophageal reflux disease)    .  PONV (postoperative nausea and vomiting)      Assessment:  76 YO F on D#7 Vanc 1250 mg q12h / D#3 Cefepime 1 gram q8h  for HCAP.    Flagyl / Diflucan DC'ed 3/20  SCr stable, CrCl ~ 58  3/20 Legionella from urine Negative  Afebrile, WBC trending down (17.8)  Steroids being weaned  Goal of Therapy:  Vancomycin trough level 15-20 mcg/ml  Plan:   Continue Vanc 1250 mg q12h / Cefepime 1 gram q8h  F/U Vanc trough today  F/U SCr  Tamala Bari, PharmD Candidate 07/12/2011,8:52 AM

## 2011-07-12 NOTE — Evaluation (Signed)
Physical Therapy Evaluation Patient Details Name: [name] MRN: [id number] DOB: [birth year] Today's Date: 07/12/2011  Problem List:  Patient Active Problem List  Diagnoses  . HYPERLIPIDEMIA  . COPD UNSPECIFIED  . ACID REFLUX DISEASE  . DYSPNEA  . COPD with acute exacerbation  . Nicotine dependence  . Constipation  . Bronchitis  . Healthcare-associated pneumonia  . Chest pain  . Leukocytosis    Past Medical History:  Past Medical History  Diagnosis Date  . COPD (chronic obstructive pulmonary disease)   . Asthma   . GERD (gastroesophageal reflux disease)   . PONV (postoperative nausea and vomiting)    Past Surgical History:  Past Surgical History  Procedure Date  . Abdominal hysterectomy   . Tonsillectomy   . Appendectomy   . Back surgery   . Gallbladder surgery     PT Assessment/Plan/Recommendation PT Assessment Clinical Impression Statement: Pt presents with SOB and bronchitis with decreased overall strength and mobility.  PT familiar with pt and notes that pt is somewhat weaker during this admission.  Pt will benefit from skilled PT in acute venue to address mobility and endurance deficits.  PT discussed follow up therapy with pt and pt states that she plans to return to work and does not wish to receive therapy.   PT Recommendation/Assessment: Patient will need skilled PT in the acute care venue PT Problem List: Decreased strength;Decreased activity tolerance;Decreased balance;Decreased mobility;Decreased safety awareness Barriers to Discharge: Decreased caregiver support PT Therapy Diagnosis : Difficulty walking;Generalized weakness PT Plan PT Frequency: Min 3X/week PT Treatment/Interventions: DME instruction;Gait training;Functional mobility training;Therapeutic activities;Therapeutic exercise;Balance training;Patient/family education;Stair training PT Recommendation Follow Up Recommendations: Home health PT (HHPT vs no follow up pending pt  progress) Equipment Recommended: None recommended by PT PT Goals  Acute Rehab PT Goals PT Goal Formulation: With patient Time For Goal Achievement: 2 weeks Pt will Ambulate: >150 feet;with modified independence;with least restrictive assistive device PT Goal: Ambulate - Progress: Goal set today Pt will Go Up / Down Stairs: 3-5 stairs;with modified independence;with least restrictive assistive device PT Goal: Up/Down Stairs - Progress: Goal set today Pt will Perform Home Exercise Program: Independently PT Goal: Perform Home Exercise Program - Progress: Goal set today  PT Evaluation Precautions/Restrictions  Precautions Precautions: Fall Restrictions Weight Bearing Restrictions: No Prior Functioning  Home Living Lives With: Alone Type of Home: House Home Layout: One level Home Access: Stairs to enter Entrance Stairs-Rails: None Entrance Stairs-Number of Steps: 3 Home Adaptive Equipment: Bedside commode/3-in-1;Shower chair with back;Walker - rolling Prior Function Level of Independence: Independent with basic ADLs;Independent with gait;Independent with transfers Cognition Cognition Arousal/Alertness: Awake/alert Overall Cognitive Status: Appears within functional limits for tasks assessed Orientation Level: Oriented X4 Sensation/Coordination Sensation Light Touch: Appears Intact Coordination Gross Motor Movements are Fluid and Coordinated: Yes Extremity Assessment RLE Assessment RLE Assessment: Exceptions to [facility] RLE Strength RLE Overall Strength Comments: Grossly WFL per functional assessment.  LLE Assessment LLE Assessment: Exceptions to [facility] LLE Strength LLE Overall Strength Comments: Grossly WFL per functional assessment.  Mobility (including Balance) Bed Mobility Supine to Sit: 6: Modified independent (Device/Increase time) Transfers Transfers: Yes Sit to Stand: 5: Supervision;From elevated surface;With upper extremity assist;From bed Sit to Stand Details  (indicate cue type and reason): Supervision for safety. Stand to Sit: 6: Modified independent (Device/Increase time) Ambulation/Gait Ambulation/Gait Assistance: 4: Min assist Ambulation/Gait Assistance Details (indicate cue type and reason): Requires HHA from therapist and stability from IV pole for increased balance.  Cues for pursed lip breathing.  Checked O2  sats while ambulating on room air, sats were 88% while ambulating and with cues for breathing.  Ambulation Distance (Feet): 150 Feet Assistive device: 1 person hand held assist;Other (Comment) (IV pole for stability) Gait Pattern: Step-through pattern Gait velocity: decreased Stairs: No Wheelchair Mobility Wheelchair Mobility: No    Exercise    End of Session PT - End of Session Activity Tolerance: Treatment limited secondary to medical complications (Comment) Patient left: in bed;with call bell in reach;with family/visitor present Nurse Communication: Mobility status for transfers;Mobility status for ambulation General Behavior During Session: [facility] for tasks performed Cognition: [facility] for tasks performed  Page, Meribeth Mattes 07/12/2011, 2:10 PM

## 2011-07-12 NOTE — Progress Notes (Signed)
   CARE MANAGEMENT NOTE 07/12/2011  Patient:  [name], [name]   Account Number:  [id number]  Date Initiated:  07/09/2011  Documentation initiated by:  [facility]  Subjective/Objective Assessment:   ADMITTED W/SOB.READMIT.     Action/Plan:   FROM HOME   Anticipated DC Date:  07/13/2011   Anticipated DC Plan:  HOME W HOME HEALTH SERVICES         Choice offered to / List presented to:  C-1 Patient           Status of service:  In process, will continue to follow Medicare Important Message given?   (If response is "NO", the following Medicare IM given date fields will be blank) Date Medicare IM given:   Date Additional Medicare IM given:    Discharge Disposition:    Per UR Regulation:  Reviewed for med. necessity/level of care/duration of stay  If discussed at Long Length of Stay Meetings, dates discussed:    Comments:  07/12/11 Catheryne Deford RN,BSN NCM [phone] PT-HH.HHC AGENCY LIST GIVEN.RECOMMEND HHPT ORDER IF MD AGREE. ALREADY USES [facility] 02. 07/09/11 Jaaziah Schulke RN,BSN NCM [phone]

## 2011-07-12 NOTE — Progress Notes (Signed)
Patient report more diarrhea this AM,  PRN imodium given, will continue to assess patient.

## 2011-07-12 NOTE — Progress Notes (Signed)
Patient seen and examined. Data reviewed. I agree with exam, assessment and plan except as otherwise outlined below.  Overall patient appears much better. She is feeling better.  Anticipate discharge March 22.

## 2011-07-13 MED ORDER — GUAIFENESIN ER 600 MG PO TB12: 600.0000 mg | ORAL_TABLET | Freq: Two times a day (BID) | ORAL | Status: DC

## 2011-07-13 MED ORDER — PREDNISONE 5 MG PO TABS: 5.0000 mg | ORAL_TABLET | Freq: Every day | ORAL | Status: AC

## 2011-07-13 MED ORDER — LEVOFLOXACIN 750 MG PO TABS: 750.0000 mg | ORAL_TABLET | Freq: Every day | ORAL | Status: AC

## 2011-07-13 MED ORDER — NICOTINE 14 MG/24HR TD PT24: 10.0000 | MEDICATED_PATCH | Freq: Every day | TRANSDERMAL | Status: AC

## 2011-07-13 MED ORDER — SUCRALFATE 1 G PO TABS: 1.0000 g | ORAL_TABLET | Freq: Three times a day (TID) | ORAL | Status: DC

## 2011-07-13 NOTE — Discharge Summary (Signed)
Patient seen and examined. Data reviewed. See my progress note for full details. I agree with discharge today.  1. COPD exacerbation/oxygen-dependent COPD: Much improved. Slow steroid taper. Continue oxygen as an outpatient. 2. Healthcare acquired Pneumonia: Clinically resolved. Leukocytosis persists and is unclear in etiology but may be related to steroids. Repeat CBC as an outpatient. New pleural effusion seen on recent chest x-ray which is asymptomatic. Patient refused CT scan. Completed 7 days of vancomycin, 5 days of Zosyn, 3 days cefepime. Was previously discharged on Levaquin but was only out of the hospital approximately 36 hours, therefore not consider this treatment failure. Complete course of Levaquin in the outpatient setting. 3. Chest pain: Resolved. Secondary to pneumonia. Negative 2-D echocardiogram reassuring. Cardiac enzymes negative. 4. Persistent leukocytosis:  Plan as above.  5. Diarrhea: Resolved. Stool culture unremarkable. C. difficile negative. 6. Elevated d-dimer: Patient refused CT. Given her acute illness she was treated pneumonia and COPD. At this point would not recommend any further evaluation as other pulmonary etiologies are more likely.  7. Cigarette smoker: Recommend cessation.

## 2011-07-13 NOTE — Progress Notes (Signed)
PROGRESS NOTE  [name] ZOX:[id number] DOB: 1936-02-27 DOA: 07/06/2011 PCP: Gweneth Dimitri, MD, MD Cardiologist: Donato Schultz, M.D.  Brief narrative: 76 year old woman with history of COPD and recent hospitalization for same presented to the emergency department with pleuritic chest pain. Diagnosed with pneumonia. Presented from primary care physician's office for "possible MI" with borderline R wave progression on EKG. Complained of chest pain and diarrhea.   Chart review:  06/29/2011-07/04/2011 hospitalization: COPD exacerbation. Treat with Levaquin.  Past medical history: Oxygen-dependent COPD, continued cigarette smoking, GERD  Consultants:  Physical therapy: Home health physical therapy. No equipment recommended.  Procedures:  March 16: 2-D echocardiogram: Left ventricular ejection fraction 65-70%. Normal wall motion. No regional wall motion abnormalities.  March 17: Bilateral lower extremity venous Dopplers: No DVT.  Antibiotics:  March 15-17, March 21: Levaquin  March 15-19: Zosyn  March 15-21: Vancomycin  March 19-21: Cefepime  Interim History: Chart reviewed in detail and summarized as above.  Subjective: Feels well. No complaints.  Objective: Filed Vitals:   07/12/11 1948 07/12/11 2150 07/13/11 0549 07/13/11 0930  BP:  101/62 99/65   Pulse:  89 77   Temp:  98.5 F (36.9 C) 98.1 F (36.7 C)   TempSrc:  Oral Oral   Resp:  18 18   Height:      Weight:   78.6 kg (173 lb 4.5 oz)   SpO2: 97% 96% 94% 94%    Intake/Output Summary (Last 24 hours) at 07/13/11 1147 Last data filed at 07/12/11 1900  Gross per 24 hour  Intake    240 ml  Output    300 ml  Net    -60 ml    Exam:   General:  Appears calm and comfortable.  Cardiovascular: Regular rate and rhythm. No murmur, rub, gallop.   Respiratory: Clear to auscultation bilaterally. Normal respiratory effort.  Psychiatric: Grossly normal mood and affect. Speech fluent and appropriate.  Data  Reviewed: Basic Metabolic Panel:  Lab 07/12/11 8119 07/11/11 0448 07/10/11 0514 07/09/11 0554 07/08/11 0518 07/07/11 0240  NA 136 137 137 138 139 --  K 3.8 4.5 -- -- -- --  CL 93* 98 102 105 103 --  CO2 38* 37* 32 29 24 --  GLUCOSE 120* 92 92 85 132* --  BUN 12 9 10 17 20  --  CREATININE 0.56 0.52 0.53 0.59 0.54 --  CALCIUM 8.7 8.1* 7.7* 7.2* 7.0* --  MG -- -- 1.9 2.1 -- 2.0  PHOS -- -- 1.8* 2.0* -- 3.2   Liver Function Tests:  Lab 07/11/11 0448 07/10/11 0514 07/09/11 0554 07/07/11 0240  AST 28 21 46* 57*  ALT 51* 45* 51* 68*  ALKPHOS 47 44 50 67  BILITOT 0.3 0.3 0.2* 0.3  PROT 4.7* 4.5* 5.0* 5.8*  ALBUMIN 2.5* 2.3* 2.4* 2.9*   CBC:  Lab 07/12/11 0454 07/11/11 0448 07/10/11 0514 07/09/11 0554 07/08/11 0518 07/07/11 0240  WBC 17.8* 17.9* 18.0* 21.3* 24.0* --  NEUTROABS -- 14.8* -- -- -- 14.8*  HGB 11.8* 12.0 11.5* 12.1 13.0 --  HCT 36.4 36.9 35.4* 37.3 40.2 --  MCV 90.5 90.4 90.8 91.9 91.2 --  PLT 309 288 272 291 365 --   Cardiac Enzymes:  Lab 07/07/11 1015 07/07/11 0240 07/06/11 1850  CKTOTAL 54 53 37  CKMB 1.8 1.9 1.6  CKMBINDEX -- -- --  TROPONINI <0.30 <0.30 <0.30    Recent Results (from the past 240 hour(s))  CLOSTRIDIUM DIFFICILE BY PCR     Status: Normal   Collection  Time   07/07/11  1:44 PM      Component Value Range Status Comment   C difficile by pcr NEGATIVE  NEGATIVE  Final   STOOL CULTURE     Status: Normal   Collection Time   07/07/11  1:44 PM      Component Value Range Status Comment   Specimen Description STOOL   Final    Special Requests NONE   Final    Culture     Final    Value: NO SALMONELLA, SHIGELLA, CAMPYLOBACTER, OR YERSINIA ISOLATED     Note: REDUCED NORMAL FLORA PRESENT   Report Status 07/10/2011 FINAL   Final      Studies: Dg Chest 2 View  07/11/2011  *RADIOLOGY REPORT*  Clinical Data: 76 year old female with chest burning.  CHEST - 2 VIEW  Comparison: 07/06/2011 and earlier.  Findings: New / increased bilateral pleural effusions,  greater on the left - moderate. Normal cardiac size and mediastinal contours. No pneumothorax or pulmonary edema.  The left pleural fluid obscures the recent area of air space opacity.  Elsewhere lung parenchyma is within normal limits. Visualized tracheal air column is within normal limits.  Stable visualized osseous structures. Right upper quadrant surgical clips.  IMPRESSION: Moderate left and small right pleural effusions now present.  The left effusion obscures the recently seen patchy airspace disease.  Original Report Authenticated By: Harley Hallmark, M.D.   Dg Chest Portable 1 View  07/06/2011  *RADIOLOGY REPORT*  Clinical Data: .  Shortness of breath.  Chest pain.  Nausea and diarrhea.  PORTABLE CHEST - 1 VIEW  Comparison: 06/29/2011  Findings: Midline trachea.  Normal heart size.  No right pleural effusion.  Cannot exclude minimal left pleural fluid. No pneumothorax.  Nonspecific mild interstitial thickening, lower lobe predominant.  Patchy left base air space disease.  Right lung clear.  Apparent left base nodularity was absent on 03/13.  IMPRESSION:  1.  New patchy left base air space disease.  Suspicious for infection or aspiration.  Atelectasis could look similar.  If PA and lateral radiographs are possible, they should be considered. 2.  Apparent nodularity at the left lung base.  Favored to be technique related. Recommend attention on follow-up. 3.  Possible trace left pleural fluid.  Original Report Authenticated By: Consuello Bossier, M.D.   Scheduled Meds:   . enoxaparin (LOVENOX) injection  40 mg Subcutaneous Q24H  . gabapentin  300 mg Oral QHS  . guaiFENesin  600 mg Oral BID  . lactobacillus  1 g Oral TID WC  . levalbuterol  1.25 mg Nebulization TID  . levofloxacin  750 mg Oral Daily  . LORazepam  0.5 mg Intravenous Once  . nicotine  14 mg Transdermal Daily  . predniSONE  20 mg Oral Q breakfast  . sodium chloride  3 mL Intravenous Q12H  . sucralfate  1 g Oral TID WC & HS  .  tiotropium  18 mcg Inhalation Daily  . DISCONTD: levalbuterol  1.25 mg Nebulization Q6H WA   Continuous Infusions:   EKG and reviewed: Sinus rhythm. No acute changes.   Assessment/Plan: 1. COPD exacerbation/oxygen-dependent COPD: Much improved. Slow steroid taper. Continue oxygen as an outpatient. 2. Healthcare acquired Pneumonia: Clinically resolved. Leukocytosis persists and is unclear in etiology but may be related to steroids. Repeat CBC as an outpatient. New pleural effusion seen on recent chest x-ray which is asymptomatic. Patient refused CT scan. Completed 7 days of vancomycin, 5 days of Zosyn, 3 days cefepime.  Was previously discharged on Levaquin but was only out of the hospital approximately 36 hours, therefore not consider this treatment failure. Complete course of Levaquin in the outpatient setting. 3. Chest pain: Resolved. Secondary to pneumonia. Negative 2-D echocardiogram reassuring. Cardiac enzymes negative. 4. Persistent leukocytosis:  Plan as above.  5. Diarrhea: Resolved. Stool culture unremarkable. C. difficile negative. 6. Elevated d-dimer: Patient refused CT. Given her acute illness she was treated pneumonia and COPD. At this point would not recommend any further evaluation as other pulmonary etiologies are more likely.  7. Cigarette smoker: Recommend cessation.  Code Status:  full code  Family Communication: Discussed with sister at bedside. All questions answered to her apparent satisfaction. Disposition Plan:  Home with home health physical therapy. Continue chronic oxygen 2 L per minute nasal cannula continuously.    Brendia Sacks, MD  [facility] Pager [phone] 07/13/2011, 11:47 AM    LOS: 7 days

## 2011-07-13 NOTE — Discharge Instructions (Addendum)
Bronchitis Bronchitis is a problem of the air tubes leading to your lungs. This problem makes it hard for air to get in and out of the lungs. You may cough a lot because your air tubes are narrow. Going without care can cause lasting (chronic) bronchitis. HOME CARE   Drink enough fluids to keep your pee (urine) clear or pale yellow.   Use a cool mist humidifier.   Quit smoking if you smoke. If you keep smoking, the bronchitis might not get better.   Only take medicine as told by your doctor.  GET HELP RIGHT AWAY IF:   Coughing keeps you awake.   You start to wheeze.   You become more sick or weak.   You have a hard time breathing or get short of breath.   You cough up blood.   Coughing lasts more than 2 weeks.   You have a fever.   Your baby is older than 3 months with a rectal temperature of 102 F (38.9 C) or higher.   Your baby is 3 months old or younger with a rectal temperature of 100.4 F (38 C) or higher.  MAKE SURE YOU:  Understand these instructions.   Will watch your condition.   Will get help right away if you are not doing well or get worse.  Document Released: 09/26/2007 Document Revised: 03/29/2011 Document Reviewed: 03/11/2009 Exit[employer] Patient Information 2012 [employer]. 

## 2011-07-13 NOTE — Progress Notes (Signed)
   CARE MANAGEMENT NOTE 07/13/2011  Patient:  [name], [name]   Account Number:  [id number]  Date Initiated:  07/09/2011  Documentation initiated by:  [facility]  Subjective/Objective Assessment:   ADMITTED W/SOB.READMIT.     Action/Plan:   FROM HOME   Anticipated DC Date:  07/13/2011   Anticipated DC Plan:  HOME W HOME HEALTH SERVICES      DC Planning Services  Patient refused services      Choice offered to / List presented to:  C-1 Patient           Status of service:  In process, will continue to follow Medicare Important Message given?   (If response is "NO", the following Medicare IM given date fields will be blank) Date Medicare IM given:   Date Additional Medicare IM given:    Discharge Disposition:    Per UR Regulation:  Reviewed for med. necessity/level of care/duration of stay  If discussed at Long Length of Stay Meetings, dates discussed:    Comments:  3/22/Joanann Mies RN,BSN NCM [phone] UPDATE:PATIENT STATES SHE DOESN'T HAVE TRAVEL 02,CONTACTED [facility] [phone],THEY WILL DELIVER BY 3:30P IF NOT SOONER.PATIENT AGREED TO WAIT FOR TRAVEL 02 FROM [facility]. NOTED PATIENT PLEASANTLY DECLINED HHPT.MD UPDATED.D/C HOME NO FURTHER NEEDS.  07/12/11 Annarae Macnair RN,BSN NCM [phone] WILL STAY W/SISTER EUNICE [place] @ D/C:[address] [place] [address]([place]C#[phone],THEN RETURN HOME([place]),HAS USED [facility] IN PAST BUT WILL THINK ABOUT HHC SINCE LAST TIME IT WAS DIFFICULT W/HER STAYING IN  COUNTY,THEN GOING TO [place] USING DIFFERENT PHYSICAL THERAPISTS. PT-HH.HHC AGENCY LIST GIVEN.RECOMMEND HHPT ORDER IF MD AGREE. ALREADY USES [facility] 02. 07/09/11 Lasasha Brophy RN,BSN NCM [phone]

## 2011-07-13 NOTE — Progress Notes (Signed)
Physical Therapy Treatment Patient Details Name: [name] MRN: [id number] DOB: [birth year] Today's Date: 07/13/2011  PT Assessment/Plan  PT - Assessment/Plan Comments on Treatment Session: Pt discharging home today. Recommend HHPT, but noted CM states pt refusing HH services. Instructed pt to use RW at home until strength and tolerance improve-pt acknowledged therapist's recommendation. Pt states she is discharging to sister's home.  PT Plan: Discharge plan remains appropriate Follow Up Recommendations: Home health PT Equipment Recommended: None recommended by PT PT Goals  Acute Rehab PT Goals PT Goal: Ambulate - Progress: Progressing toward goal PT Goal: Up/Down Stairs - Progress: Progressing toward goal  PT Treatment Precautions/Restrictions  Precautions Precautions: Fall Restrictions Weight Bearing Restrictions: No Mobility (including Balance) Bed Mobility Bed Mobility: No Transfers Transfers: Yes Sit to Stand: 5: Supervision;From bed Stand to Sit: 5: Supervision;To bed Ambulation/Gait Ambulation/Gait: Yes Ambulation/Gait Assistance Details (indicate cue type and reason): Pt holding onto dynamap and with 1 HHA from therapist. O2 sats 88% on RA during ambulation, stairs-RN aware. Pt unsteady and appears generally weak.  Ambulation Distance (Feet): 120 Feet Assistive device: 1 person hand held assist Gait Pattern: Step-through pattern Stairs: Yes Stairs Assistance: 4: Min assist Stairs Assistance Details (indicate cue type and reason): assist to steady. reciprocating pattern while ascending, step to while descending.  Stair Management Technique: One rail Right Number of Stairs: 2   Posture/Postural Control Posture/Postural Control: No significant limitations Exercise    End of Session PT - End of Session Equipment Utilized During Treatment: Gait belt Activity Tolerance: Patient tolerated treatment well (Somewhat limited by fatigue) Patient left: in bed;with call  bell in reach General Behavior During Session: [facility] for tasks performed Cognition: [facility] for tasks performed  Rebeca Alert [facility] 07/13/2011, 11:53 AM [phone]

## 2011-07-13 NOTE — Progress Notes (Signed)
   CARE MANAGEMENT NOTE 07/13/2011  Patient:  [name], [name]   Account Number:  [id number]  Date Initiated:  07/09/2011  Documentation initiated by:  [facility]  Subjective/Objective Assessment:   ADMITTED W/SOB.READMIT.     Action/Plan:   FROM HOME   Anticipated DC Date:  07/13/2011   Anticipated DC Plan:  HOME W HOME HEALTH SERVICES      DC Planning Services  Patient refused services      Choice offered to / List presented to:  C-1 Patient           Status of service:  In process, will continue to follow Medicare Important Message given?   (If response is "NO", the following Medicare IM given date fields will be blank) Date Medicare IM given:   Date Additional Medicare IM given:    Discharge Disposition:    Per UR Regulation:  Reviewed for med. necessity/level of care/duration of stay  If discussed at Long Length of Stay Meetings, dates discussed:    Comments:  3/22/Kizer Nobbe RN,BSN NCM [phone] NOTED PATIENT PLEASANTLY DECLINED HHPT.MD UPDATED.D/C HOME NO FURTHER NEEDS.  07/12/11 Lynetta Tomczak RN,BSN NCM [phone] WILL STAY W/[facility]UN[facility] @ D/C:[address] [place] [id number]([place]),C#[phone],THEN RETURN HOME([place]),HAS USED [facility] IN PAST BUT WILL THINK ABOUT HHC SINCE LAST TIME IT WAS DIFFICULT W/HER STAYING IN [place],THEN GOING TO [place] USING DIFFERENT PHYSICAL THERAPISTS. PT-HH.[facility] AGENCY LIST GIVEN.RECOMMEND HHPT ORDER IF MD AGREE. ALREADY USES [facility] 02. 07/09/11 Andreana Klingerman RN,BSN NCM [phone]

## 2011-07-13 NOTE — Discharge Summary (Signed)
Physician Discharge Summary  Patient ID: [name] MRN: [id number] DOB/AGE: [birth year] 76 y.o.  Admit date: 07/06/2011 Discharge date: 07/13/2011  Primary Care Physician:  Gweneth Dimitri, MD, MD   Discharge Diagnoses:    Active Problems:  Bronchitis  Healthcare-associated pneumonia  Chest pain  Leukocytosis   Medication List  As of 07/13/2011 11:20 AM   TAKE these medications         albuterol 108 (90 BASE) MCG/ACT inhaler   Commonly known as: PROVENTIL HFA;VENTOLIN HFA   Inhale 2 puffs into the lungs every 6 (six) hours as needed. For shortness of breath.      albuterol (2.5 MG/3ML) 0.083% nebulizer solution   Commonly known as: PROVENTIL   Take 2.5 mg by nebulization 2 (two) times daily as needed. For shortness of breath.      aspirin 81 MG chewable tablet   Chew 81 mg by mouth daily.      atorvastatin 40 MG tablet   Commonly known as: LIPITOR   Take 40 mg by mouth daily.      budesonide-formoterol 160-4.5 MCG/ACT inhaler   Commonly known as: SYMBICORT   Inhale 2 puffs into the lungs 2 (two) times daily.      CALCIUM + D PO   Take 1 tablet by mouth daily.      cetirizine 10 MG tablet   Commonly known as: ZYRTEC   Take 10 mg by mouth daily.      fish oil-omega-3 fatty acids 1000 MG capsule   Take 2 g by mouth daily.      gabapentin 100 MG capsule   Commonly known as: NEURONTIN   Take 200-300 mg by mouth 2 (two) times daily. 1-2 in am, 3 in pm      guaiFENesin 600 MG 12 hr tablet   Commonly known as: MUCINEX   Take 1 tablet (600 mg total) by mouth 2 (two) times daily.      levofloxacin 750 MG tablet   Commonly known as: LEVAQUIN   Take 1 tablet (750 mg total) by mouth daily.      Magnesium 250 MG Tabs   Take 1 tablet by mouth daily.      nicotine 14 mg/24hr patch   Commonly known as: NICODERM CQ - dosed in mg/24 hours   Place 10 patches onto the skin daily.      omeprazole 20 MG capsule   Commonly known as: PRILOSEC   Take 20 mg by mouth  daily.      predniSONE 5 MG tablet   Commonly known as: DELTASONE   Take 1 tablet (5 mg total) by mouth daily. Take 4 tabs 3/23, take 3 tabs 3/24 and 3/25, take 2 tabs 3/26 and 3/27 take 1 tab 3/28 and 3/29 then stop      sucralfate 1 G tablet   Commonly known as: CARAFATE   Take 1 tablet (1 g total) by mouth 4 (four) times daily -  with meals and at bedtime.      tiotropium 18 MCG inhalation capsule   Commonly known as: SPIRIVA   Place 18 mcg into inhaler and inhale daily.      Vitamin B-12 1000 MCG Subl   Place 1 tablet under the tongue daily.      Vitamin D 2000 UNITS tablet   Take 2,000 Units by mouth daily.             Disposition and Follow-up: pt is medically stable and ready for discharge to home.  Has appointment with PCP 07/25/11  Consults:  None  Physical Exam: General: Alert, awake, oriented x3, in no acute distress.  HEENT: No bruits, no goiter. PERRL  Heart: Regular rate and rhythm, without murmurs, rubs, gallops. No LEE  Lungs: Normal effort. Breath sounds clear to auscultation bilaterally. No wheeze  Abdomen: Soft, nontender, nondistended, positive bowel sounds.  Extremities: No clubbing cyanosis or edema with positive pedal pulses.  Neuro: Grossly intact, nonfocal.   Significant Diagnostic Studies:  Dg Chest Portable 1 View  07/06/2011  *RADIOLOGY REPORT*  Clinical Data: .  Shortness of breath.  Chest pain.  Nausea and diarrhea.  PORTABLE CHEST - 1 VIEW  Comparison: 06/29/2011  Findings: Midline trachea.  Normal heart size.  No right pleural effusion.  Cannot exclude minimal left pleural fluid. No pneumothorax.  Nonspecific mild interstitial thickening, lower lobe predominant.  Patchy left base air space disease.  Right lung clear.  Apparent left base nodularity was absent on 03/13.  IMPRESSION:  1.  New patchy left base air space disease.  Suspicious for infection or aspiration.  Atelectasis could look similar.  If PA and lateral radiographs are possible, they  should be considered. 2.  Apparent nodularity at the left lung base.  Favored to be technique related. Recommend attention on follow-up. 3.  Possible trace left pleural fluid.  Original Report Authenticated By: Consuello Bossier, M.D.    Labs Reviewed  CBC - Abnormal; Notable for the following:    WBC 19.2 (*)    All other components within normal limits  POCT I-STAT, CHEM 8 - Abnormal; Notable for the following:    Sodium 134 (*)    BUN 31 (*)    Glucose, Bld 130 (*)    Calcium, Ion 1.06 (*)    Hemoglobin 16.7 (*)    HCT 49.0 (*)    All other components within normal limits  BLOOD GAS, ARTERIAL - Abnormal; Notable for the following:    pCO2 arterial 50.3 (*)    Bicarbonate 30.1 (*)    Acid-Base Excess 4.7 (*)    All other components within normal limits  D-DIMER, QUANTITATIVE - Abnormal; Notable for the following:    D-Dimer, Quant 1.10 (*)    All other components within normal limits  PRO B NATRIURETIC PEPTIDE - Abnormal; Notable for the following:    Pro B Natriuretic peptide (BNP) 348.6 (*)    All other components within normal limits  COMPREHENSIVE METABOLIC PANEL - Abnormal; Notable for the following:    Glucose, Bld 145 (*)    Calcium 7.7 (*)    Total Protein 5.8 (*)    Albumin 2.9 (*)    AST 57 (*)    ALT 68 (*)    GFR calc non Af Amer 88 (*)    All other components within normal limits  CBC - Abnormal; Notable for the following:    WBC 15.6 (*)    All other components within normal limits  DIFFERENTIAL - Abnormal; Notable for the following:    Neutrophils Relative 95 (*)    Neutro Abs 14.8 (*)    Lymphocytes Relative 4 (*)    Lymphs Abs 0.5 (*)    Monocytes Relative 2 (*)    All other components within normal limits  CBC - Abnormal; Notable for the following:    WBC 24.0 (*)    All other components within normal limits  BASIC METABOLIC PANEL - Abnormal; Notable for the following:    Potassium 3.4 (*)  Glucose, Bld 132 (*)    Calcium 7.0 (*)    All other  components within normal limits  CBC - Abnormal; Notable for the following:    WBC 21.3 (*)    All other components within normal limits  COMPREHENSIVE METABOLIC PANEL - Abnormal; Notable for the following:    Potassium 3.0 (*)    Calcium 7.2 (*)    Total Protein 5.0 (*)    Albumin 2.4 (*)    AST 46 (*)    ALT 51 (*)    Total Bilirubin 0.2 (*)    GFR calc non Af Amer 89 (*)    All other components within normal limits  PHOSPHORUS - Abnormal; Notable for the following:    Phosphorus 2.0 (*)    All other components within normal limits  VANCOMYCIN, TROUGH - Abnormal; Notable for the following:    Vancomycin Tr 8.0 (*)    All other components within normal limits  CBC - Abnormal; Notable for the following:    WBC 18.0 (*)    Hemoglobin 11.5 (*)    HCT 35.4 (*)    All other components within normal limits  COMPREHENSIVE METABOLIC PANEL - Abnormal; Notable for the following:    Calcium 7.7 (*)    Total Protein 4.5 (*)    Albumin 2.3 (*)    ALT 45 (*)    All other components within normal limits  PHOSPHORUS - Abnormal; Notable for the following:    Phosphorus 1.8 (*)    All other components within normal limits  CBC - Abnormal; Notable for the following:    WBC 17.9 (*)    All other components within normal limits  DIFFERENTIAL - Abnormal; Notable for the following:    Neutrophils Relative 83 (*)    Neutro Abs 14.8 (*)    Lymphocytes Relative 10 (*)    Monocytes Absolute 1.1 (*)    All other components within normal limits  COMPREHENSIVE METABOLIC PANEL - Abnormal; Notable for the following:    CO2 37 (*) REPEATED TO VERIFY   Calcium 8.1 (*)    Total Protein 4.7 (*)    Albumin 2.5 (*)    ALT 51 (*)    All other components within normal limits  CBC - Abnormal; Notable for the following:    WBC 17.8 (*)    Hemoglobin 11.8 (*)    All other components within normal limits  BASIC METABOLIC PANEL - Abnormal; Notable for the following:    Chloride 93 (*) REPEATED TO VERIFY     CO2 38 (*) REPEATED TO VERIFY   Glucose, Bld 120 (*)    All other components within normal limits  URINALYSIS, ROUTINE W REFLEX MICROSCOPIC  POCT I-STAT TROPONIN I  CARDIAC PANEL(CRET KIN+CKTOT+MB+TROPI)  CARDIAC PANEL(CRET KIN+CKTOT+MB+TROPI)  LIPID PANEL  MAGNESIUM  PHOSPHORUS  APTT  PROTIME-INR  CARDIAC PANEL(CRET KIN+CKTOT+MB+TROPI)  CLOSTRIDIUM DIFFICILE BY PCR  STOOL CULTURE  MAGNESIUM  MAGNESIUM  LEGIONELLA ANTIGEN, URINE  CULTURE, SPUTUM-ASSESSMENT  LEGIONELLA ANTIGEN, URINE        Dg Chest 2 View  07/11/2011  *RADIOLOGY REPORT*  Clinical Data: 76 year old female with chest burning.  CHEST - 2 VIEW  Comparison: 07/06/2011 and earlier.  Findings: New / increased bilateral pleural effusions, greater on the left - moderate. Normal cardiac size and mediastinal contours. No pneumothorax or pulmonary edema.  The left pleural fluid obscures the recent area of air space opacity.  Elsewhere lung parenchyma is within normal limits. Visualized tracheal air column  is within normal limits.  Stable visualized osseous structures. Right upper quadrant surgical clips.  IMPRESSION: Moderate left and small right pleural effusions now present.  The left effusion obscures the recently seen patchy airspace disease.  Original Report Authenticated By: Harley Hallmark, M.D.   Dg Chest 2 View  06/29/2011  *RADIOLOGY REPORT*  Clinical Data: Shortness of breath.  Congestion and cough.  CHEST - 2 VIEW  Comparison: 10/12/2007.  Findings: The lungs are clear without focal infiltrate, edema, pneumothorax or pleural effusion. Interstitial markings are diffusely coarsened with chronic features. Right apical scarring is stable.  The cardiopericardial silhouette is within normal limits for size. Imaged bony structures of the thorax are intact. Telemetry leads overlie the chest.  IMPRESSION: No acute cardiopulmonary process.  Original Report Authenticated By: ERIC A. MANSELL, M.D.   Dg Chest Portable 1  View  07/06/2011  *RADIOLOGY REPORT*  Clinical Data: .  Shortness of breath.  Chest pain.  Nausea and diarrhea.  PORTABLE CHEST - 1 VIEW  Comparison: 06/29/2011  Findings: Midline trachea.  Normal heart size.  No right pleural effusion.  Cannot exclude minimal left pleural fluid. No pneumothorax.  Nonspecific mild interstitial thickening, lower lobe predominant.  Patchy left base air space disease.  Right lung clear.  Apparent left base nodularity was absent on 03/13.  IMPRESSION:  1.  New patchy left base air space disease.  Suspicious for infection or aspiration.  Atelectasis could look similar.  If PA and lateral radiographs are possible, they should be considered. 2.  Apparent nodularity at the left lung base.  Favored to be technique related. Recommend attention on follow-up. 3.  Possible trace left pleural fluid.  Original Report Authenticated By: Consuello Bossier, M.D.       Brief H and P: For complete details please refer to admission H and P, but in brief   Ms Mccready was discharged from the hospital 3/13, after management for an acute exacerbation of COPD. Prior to that hospitalisation, she had had about a week of cough productive of yellow colored sputum. She was treated with bronchodilators/steroids/levaquin.  She presented [facility] ED 3/15 reporting that she has not felt terribly great since her symptoms started. She also complainted of pleuritic chest pain, which she says radiates to the back. A CXR showed LLL consolidation, which is new compared to recent cxr. Her wbc was 52841 today. She has had nausea and diarrhea but no vomiting. She gets dyspneic with minimal exertion. Patient has been given vanc/zosyn/some ivf, morphine and referred for admission. Denies cough.      Hospital Course:   Active Problems:  Bronchitis  Healthcare-associated pneumonia  Chest pain  Leukocytosis  1. Bronchitis/HCAP/CP/leukocytosis:  Pt admitted and started on Vanc/Zosyn. Chest xrays as above.  She improved  slowly. She received 6 days of Vanc, along with 5 days of Zosyn. Then Zosyn changed to  cefepine  For 2days. On 07/12/11 Vanc discontinued and cefepine changed to levaquin.  Pt will have 10 more days of levaquin to complete 10 day course. She was also provided with steroids, bronchodilator nebs and increased O2 support. She gradually responded to treatment.  At time of discharge pt is much improved and at baseline with Sats.  She am ambulated on day of discharge and sats dropped to 87%She  is usually on 2L at home at night.She will be discharged on 2L oxygen [place]. Her WC is stable at 17 and suspect this is related to steroids.   Afebrile, non-toxic appearing. CP  resolved and reports upper back pain mopstly.  Legionella negative. Pt will also continue prednisone taper as above. 2. Diarrhea: developed diarrhea likely related to antibiotics. Pt provided with imodium prn. At time of discharge pt having her usual amount of stools. Remain somewhat loose.Cdiff neg.  3. Hypokalemia: secondary #2. Resolved.  4. GERD:Initially held pepcid secondary #2. At time of discharge at baseline. Resume pepcid.  5. COPD: see #1. On o2 at 2L at home. Continue nebs and prednisone. Monitor   Time spent on Discharge: 35 minutes   Signed: Gwenyth Bender 07/13/2011, 11:20 AM

## 2011-07-25 DIAGNOSIS — M949 Disorder of cartilage, unspecified: Secondary | ICD-10-CM | POA: Diagnosis not present

## 2011-07-25 DIAGNOSIS — J449 Chronic obstructive pulmonary disease, unspecified: Secondary | ICD-10-CM | POA: Diagnosis not present

## 2011-07-25 DIAGNOSIS — R7301 Impaired fasting glucose: Secondary | ICD-10-CM | POA: Diagnosis not present

## 2011-07-25 DIAGNOSIS — J189 Pneumonia, unspecified organism: Secondary | ICD-10-CM | POA: Diagnosis not present

## 2011-07-25 DIAGNOSIS — M899 Disorder of bone, unspecified: Secondary | ICD-10-CM | POA: Diagnosis not present

## 2011-07-25 DIAGNOSIS — F17200 Nicotine dependence, unspecified, uncomplicated: Secondary | ICD-10-CM | POA: Diagnosis not present

## 2011-07-25 DIAGNOSIS — E782 Mixed hyperlipidemia: Secondary | ICD-10-CM | POA: Diagnosis not present

## 2011-07-25 DIAGNOSIS — K219 Gastro-esophageal reflux disease without esophagitis: Secondary | ICD-10-CM | POA: Diagnosis not present

## 2011-09-24 DIAGNOSIS — J449 Chronic obstructive pulmonary disease, unspecified: Secondary | ICD-10-CM | POA: Diagnosis not present

## 2011-09-24 DIAGNOSIS — J310 Chronic rhinitis: Secondary | ICD-10-CM | POA: Diagnosis not present

## 2011-09-24 DIAGNOSIS — K220 Achalasia of cardia: Secondary | ICD-10-CM | POA: Diagnosis not present

## 2011-10-08 DIAGNOSIS — J449 Chronic obstructive pulmonary disease, unspecified: Secondary | ICD-10-CM | POA: Diagnosis not present

## 2012-03-24 DIAGNOSIS — R5381 Other malaise: Secondary | ICD-10-CM | POA: Diagnosis not present

## 2012-03-24 DIAGNOSIS — R0602 Shortness of breath: Secondary | ICD-10-CM | POA: Diagnosis not present

## 2012-03-24 DIAGNOSIS — E782 Mixed hyperlipidemia: Secondary | ICD-10-CM | POA: Diagnosis not present

## 2012-03-24 DIAGNOSIS — F329 Major depressive disorder, single episode, unspecified: Secondary | ICD-10-CM | POA: Diagnosis not present

## 2012-03-24 DIAGNOSIS — M545 Low back pain: Secondary | ICD-10-CM | POA: Diagnosis not present

## 2012-03-24 DIAGNOSIS — M949 Disorder of cartilage, unspecified: Secondary | ICD-10-CM | POA: Diagnosis not present

## 2012-03-24 DIAGNOSIS — R7301 Impaired fasting glucose: Secondary | ICD-10-CM | POA: Diagnosis not present

## 2012-04-01 DIAGNOSIS — J111 Influenza due to unidentified influenza virus with other respiratory manifestations: Secondary | ICD-10-CM | POA: Diagnosis not present

## 2012-04-01 DIAGNOSIS — R509 Fever, unspecified: Secondary | ICD-10-CM | POA: Diagnosis not present

## 2012-04-04 ENCOUNTER — Ambulatory Visit: Payer: Medicare Other | Admitting: Internal Medicine

## 2012-04-10 ENCOUNTER — Encounter: Payer: Self-pay | Admitting: Internal Medicine

## 2012-04-10 ENCOUNTER — Ambulatory Visit (INDEPENDENT_AMBULATORY_CARE_PROVIDER_SITE_OTHER): Payer: Medicare Other | Admitting: Internal Medicine

## 2012-04-10 VITALS — BP 106/80 | HR 75 | Temp 97.7°F | Ht 60.0 in | Wt 179.4 lb

## 2012-04-10 DIAGNOSIS — J9610 Chronic respiratory failure, unspecified whether with hypoxia or hypercapnia: Secondary | ICD-10-CM | POA: Diagnosis not present

## 2012-04-10 DIAGNOSIS — J449 Chronic obstructive pulmonary disease, unspecified: Secondary | ICD-10-CM

## 2012-04-10 DIAGNOSIS — J4489 Other specified chronic obstructive pulmonary disease: Secondary | ICD-10-CM

## 2012-04-10 NOTE — Progress Notes (Signed)
Subjective:     Patient ID: [name], female   DOB: 26-Jul-1935   MRN: [id number]  HPI  53 yowf  Quit smoking after admit [facility] 06/2011    Admit date: 07/06/2011  Discharge date: 07/13/2011  Bronchitis  Healthcare-associated pneumonia  Chest pain  Leukocytosis   04/10/2012 new pt eval  ov/Wert cc doe x 100 ft on RA , maybe 200 ft on 02 and has not done rehab to date, assoc with sense of chest congestion but no purulent sputum. Overall pattern is worse since quit smoking despite rx with symbicort and spiriva.    Sleeping ok without nocturnal  or early am exacerbation  of respiratory  c/o's or need for noct saba. Also denies any obvious fluctuation of symptoms with weather or environmental changes or other aggravating or alleviating factors except as outlined above    ROS  The following are not active complaints unless bolded sore throat, dysphagia, dental problems, itching, sneezing,  nasal congestion or excess/ purulent secretions, ear ache,   fever, chills, sweats, unintended wt loss, pleuritic or exertional cp, hemoptysis,  orthopnea pnd or leg swelling, presyncope, palpitations, heartburn, abdominal pain, anorexia, nausea, vomiting, diarrhea  or change in bowel or urinary habits, change in stools or urine, dysuria,hematuria,  rash, arthralgias, visual complaints, headache, numbness weakness or ataxia or problems with walking or coordination,  change in mood/affect or memory.           Past Medical History:  Severe reflux with dysphagia  Hyperlipidemia    Family History:  Negative for respiratory diseases or atopy             Review of Systems     Objective:   Physical Exam  Wt Readings from Last 3 Encounters:  04/10/12 179 lb 6.4 oz (81.375 kg)  07/13/11 173 lb 4.5 oz (78.6 kg)  06/29/11 165 lb 5.5 oz (75 kg)      she is a moderately obese pleasant elderly white female who appears about 5 years older than stated age worse with no increased work of breathing  walking into the exam room or getting up on the table.HEENT mild turbinate edema. Oropharynx no thrush or excess pnd or cobblestoning. No JVD or cervical adenopathy. Mild accessory muscle hypertrophy. Trachea midline, nl thryroid. Chest was hyperinflated by percussion with diminished breath sounds and moderate increased exp time without wheeze. Hoover sign positive at mid inspiration. Regular rate and rhythm without murmur gallop or rub or increase P2. Abd: no hsm, nl excursion. Ext warm without C,C or E.   cxr /pfts done by Dr Uvaldo Rising > results requested  Assessment:          Plan:

## 2012-04-10 NOTE — Patient Instructions (Addendum)
Symbicort 160 Take 2 puffs first thing in am and then another 2 puffs about 12 hours later.   spiriva one each am  Work on inhaler technique:  relax and gently blow all the way out then take a nice smooth deep breath back in, triggering the inhaler at same time you start breathing in.  Hold for up to 5 seconds if you can.  Rinse and gargle with water when done   If your mouth or throat starts to bother you,   I suggest you time the inhaler to your dental care and after using the inhaler(s) brush teeth and tongue with a baking soda containing toothpaste and when you rinse this out, gargle with it first to see if this helps your mouth and throat.    Please schedule a follow up office visit in 4 weeks, sooner if needed pft's on return

## 2012-04-10 NOTE — Assessment & Plan Note (Signed)
When respiratory symptoms begin or become refractory well after a patient reports complete smoking cessation,  Especially when this wasn't the case while they were smoking, a red flag is raised based on the work of Dr Primitivo Gauze which states:  if you quit smoking when your best day FEV1 is still well preserved it is highly unlikely you will progress to severe disease.  That is to say, once the smoking stops,  the symptoms should not suddenly erupt or markedly worsen.  If so, the differential diagnosis should include  obesity/deconditioning,  LPR/Reflux/Aspiration syndromes, anxiety,  occult CHF, or  especially side effect of medications commonly used in this population.    In her case it may well be a combination of problems with the biggest being inability to use medications effectively combined with anxiety and deconditioning  The proper method of use, as well as anticipated side effects, of a metered-dose inhaler are discussed and demonstrated to the patient. Improved effectiveness after extensive coaching during this visit to a level of approximately  50% but 100% with dpi so have asked her to continue both symbicort and spiriva for now and minimize use of saba x one month trial then return for pft's

## 2012-04-10 NOTE — Assessment & Plan Note (Signed)
-   Placed on 24 h 02 at discharge from [facility] 06/2011 but not adherent  Reviewed approp use of ambulatory 02

## 2012-04-28 DIAGNOSIS — F339 Major depressive disorder, recurrent, unspecified: Secondary | ICD-10-CM | POA: Diagnosis not present

## 2012-04-28 DIAGNOSIS — IMO0002 Reserved for concepts with insufficient information to code with codable children: Secondary | ICD-10-CM | POA: Diagnosis not present

## 2012-04-28 DIAGNOSIS — J449 Chronic obstructive pulmonary disease, unspecified: Secondary | ICD-10-CM | POA: Diagnosis not present

## 2012-05-01 DIAGNOSIS — Z23 Encounter for immunization: Secondary | ICD-10-CM | POA: Diagnosis not present

## 2012-05-15 ENCOUNTER — Ambulatory Visit (INDEPENDENT_AMBULATORY_CARE_PROVIDER_SITE_OTHER): Payer: Medicare Other | Admitting: Internal Medicine

## 2012-05-15 ENCOUNTER — Encounter: Payer: Self-pay | Admitting: Internal Medicine

## 2012-05-15 VITALS — BP 140/78 | HR 94 | Temp 97.6°F | Ht 61.0 in | Wt 178.0 lb

## 2012-05-15 DIAGNOSIS — J449 Chronic obstructive pulmonary disease, unspecified: Secondary | ICD-10-CM | POA: Diagnosis not present

## 2012-05-15 DIAGNOSIS — J9610 Chronic respiratory failure, unspecified whether with hypoxia or hypercapnia: Secondary | ICD-10-CM

## 2012-05-15 LAB — PULMONARY FUNCTION TEST

## 2012-05-15 NOTE — Progress Notes (Signed)
PFT done today. 

## 2012-05-15 NOTE — Patient Instructions (Addendum)
Ok to use 02 at bedtime and as needed during the day when exerting  Plan A Symbicort and spiriva  Only use your albuterol (plan B is proaire,  Then Plan C is nebulizer) as a rescue medication to be used if you can't catch your breath by resting or doing a relaxed purse lip breathing pattern. The less you use it, the better it will work when you need it.   Please see patient coordinator before you leave today  to schedule to rehab  Please schedule a follow up visit in 3 months but call sooner if needed

## 2012-05-15 NOTE — Progress Notes (Signed)
Subjective:     Patient ID: [name], female   DOB: 1935-11-14   MRN: [id number]  HPI  42 yowf  Quit smoking after admit [facility] 06/2011    Admit date: 07/06/2011  Discharge date: 07/13/2011  Bronchitis  Healthcare-associated pneumonia  Chest pain  Leukocytosis   04/10/2012 new pt eval  ov/[name] cc doe x 100 ft on RA , maybe 200 ft on 02 and has not done rehab to date, assoc with sense of chest congestion but no purulent sputum. Overall pattern is worse since quit smoking despite rx with symbicort and spiriva.   rec Symbicort 160 Take 2 puffs first thing in am and then another 2 puffs about 12 hours later.   spiriva one each am  Work on inhaler technique:   05/15/2012 f/u ov/[name] cc no change doe esp in am and typically  using saba hfa by mid day every day plus   Neb saba " if over does it. "  No obvious daytime variabilty or assoc chronic cough or cp or chest tightness, subjective wheeze overt sinus or hb symptoms. No unusual exp hx   Sleeping ok without nocturnal  or early am exacerbation  of respiratory  c/o's or need for noct saba. Also denies any obvious fluctuation of symptoms with weather or environmental changes or other aggravating or alleviating factors except as outlined above    ROS  The following are not active complaints unless bolded sore throat, dysphagia, dental problems, itching, sneezing,  nasal congestion or excess/ purulent secretions, ear ache,   fever, chills, sweats, unintended wt loss, pleuritic or exertional cp, hemoptysis,  orthopnea pnd or leg swelling, presyncope, palpitations, heartburn, abdominal pain, anorexia, nausea, vomiting, diarrhea  or change in bowel or urinary habits, change in stools or urine, dysuria,hematuria,  rash, arthralgias, visual complaints, headache, numbness weakness or ataxia or problems with walking or coordination,  change in mood/affect or memory.           Past Medical History:  Severe reflux with dysphagia  Hyperlipidemia     Family History:  Negative for respiratory diseases or atopy                   Objective:   Physical Exam Wt 178 05/15/2012  178  Wt Readings from Last 3 Encounters:  04/10/12 179 lb 6.4 oz (81.375 kg)  07/13/11 173 lb 4.5 oz (78.6 kg)  06/29/11 165 lb 5.5 oz (75 kg)      she is a moderately obese pleasant elderly white female who appears about 5 years older than stated age worse with no increased work of breathing walking into the exam room or getting up on the table.HEENT mild turbinate edema. Oropharynx no thrush or excess pnd or cobblestoning. No JVD or cervical adenopathy. Mild accessory muscle hypertrophy. Trachea midline, nl thryroid. Chest was hyperinflated by percussion with diminished breath sounds and moderate increased exp time without wheeze. Hoover sign positive at mid inspiration. Regular rate and rhythm without murmur gallop or rub or increase P2. Abd: no hsm, nl excursion. Ext warm without C,C or E.      Assessment:          Plan:

## 2012-05-18 NOTE — Assessment & Plan Note (Addendum)
-   hfa 90% 05/15/2012  - PFTs 05/15/2012 FEV1  0.61 ( 36%) ratio 57 and 21% better p B2 and DLCO 44%  106 corrected  So gold III and relatively well compensated on combination LABA/LAMA/ ICS though concerned over using saba with exertion when would do just as well pacing herself and using 02 "when she overdoes it" as prev recommended since we know she desats with exertion.  The proper method of use, as well as anticipated side effects, of a metered-dose inhaler are discussed and demonstrated to the patient. Improved effectiveness after extensive coaching during this visit to a level of approximately  90%  Referred to rehab and she agreed to go

## 2012-05-18 NOTE — Assessment & Plan Note (Signed)
-   Placed on 24 h 02 at discharge from [facility] 06/2011 but not adherent  Reviewed approp use of 02 at hs and prn with exertion.

## 2012-06-09 ENCOUNTER — Encounter: Payer: Self-pay | Admitting: Internal Medicine

## 2012-07-28 ENCOUNTER — Inpatient Hospital Stay (HOSPITAL_COMMUNITY): Admission: RE | Admit: 2012-07-28 | Payer: Medicare Other | Source: Ambulatory Visit

## 2012-08-06 ENCOUNTER — Encounter (HOSPITAL_COMMUNITY): Payer: Self-pay

## 2012-08-06 ENCOUNTER — Encounter (HOSPITAL_COMMUNITY)
Admission: RE | Admit: 2012-08-06 | Discharge: 2012-08-06 | Disposition: A | Discharge status: Home / Self Care | Payer: Medicare Other | Source: Ambulatory Visit | Attending: Internal Medicine | Admitting: Internal Medicine

## 2012-08-06 DIAGNOSIS — E876 Hypokalemia: Secondary | ICD-10-CM | POA: Insufficient documentation

## 2012-08-06 DIAGNOSIS — Z5189 Encounter for other specified aftercare: Secondary | ICD-10-CM | POA: Insufficient documentation

## 2012-08-06 DIAGNOSIS — J441 Chronic obstructive pulmonary disease with (acute) exacerbation: Secondary | ICD-10-CM | POA: Insufficient documentation

## 2012-08-06 DIAGNOSIS — R079 Chest pain, unspecified: Secondary | ICD-10-CM | POA: Insufficient documentation

## 2012-08-06 DIAGNOSIS — D72829 Elevated white blood cell count, unspecified: Secondary | ICD-10-CM | POA: Insufficient documentation

## 2012-08-06 DIAGNOSIS — F17200 Nicotine dependence, unspecified, uncomplicated: Secondary | ICD-10-CM | POA: Insufficient documentation

## 2012-08-06 NOTE — Progress Notes (Signed)
Ms> Krabbenhoft came today for orientation to Pulmonary Rehab. Very neat appearance, somewhat apprehensive on arrival.  She did bring 75 yr old granddaughter with her.   Reviewed program participation and expectations with her and obtainable goals were set by patient.  Nurse assessment done, breath sounds decreased few crackles in bases.  No peripheral edema, good distal pulses.  Heart rate irregular.  Previous ECG done in 06/2011 showed PACs.  PHQ screening 9.  We discussed QOL affected by physical limitations and fairly recent loss of close family member.  She takes care of 37 year old granddaughter.  She will be unable to attend 2 x per week and we will encourage and support in developing early home exercise to her improve her breathing.  Safety and hand hygiene in the exercise area reviewed with patient.  Patient voices understanding.Demonstration and practice of PLB using pulse oximeter.  Patient able to return demonstration satisfactorily. We will call to schedule her start date after final paperwork is completed.  We look forward to working with this very nice lady.   Cathie Olden RN

## 2012-08-13 ENCOUNTER — Ambulatory Visit: Payer: Medicare Other | Admitting: Internal Medicine

## 2012-08-14 ENCOUNTER — Encounter (HOSPITAL_COMMUNITY)
Admission: RE | Admit: 2012-08-14 | Discharge: 2012-08-14 | Disposition: A | Discharge status: Home / Self Care | Payer: Medicare Other | Source: Ambulatory Visit | Attending: Internal Medicine | Admitting: Internal Medicine

## 2012-08-14 DIAGNOSIS — F17200 Nicotine dependence, unspecified, uncomplicated: Secondary | ICD-10-CM | POA: Diagnosis not present

## 2012-08-14 DIAGNOSIS — Z5189 Encounter for other specified aftercare: Secondary | ICD-10-CM | POA: Diagnosis not present

## 2012-08-14 DIAGNOSIS — R079 Chest pain, unspecified: Secondary | ICD-10-CM | POA: Diagnosis not present

## 2012-08-14 DIAGNOSIS — D72829 Elevated white blood cell count, unspecified: Secondary | ICD-10-CM | POA: Diagnosis not present

## 2012-08-14 DIAGNOSIS — J441 Chronic obstructive pulmonary disease with (acute) exacerbation: Secondary | ICD-10-CM | POA: Diagnosis not present

## 2012-08-14 DIAGNOSIS — E876 Hypokalemia: Secondary | ICD-10-CM | POA: Diagnosis not present

## 2012-08-14 NOTE — Progress Notes (Signed)
[name] came today for her first exercise session in Pulmonary Rehab.  6 min walk test was done.  Oxygen level 96-97% on O2@2L   throughout the test, heart rate 82-88.  She walked 780 feet.  Oriented to equipment use and safety.  Patient voiced understanding.

## 2012-08-15 ENCOUNTER — Ambulatory Visit (INDEPENDENT_AMBULATORY_CARE_PROVIDER_SITE_OTHER): Payer: Medicare Other | Admitting: Internal Medicine

## 2012-08-15 ENCOUNTER — Encounter: Payer: Self-pay | Admitting: Internal Medicine

## 2012-08-15 ENCOUNTER — Ambulatory Visit (INDEPENDENT_AMBULATORY_CARE_PROVIDER_SITE_OTHER)
Admission: RE | Admit: 2012-08-15 | Discharge: 2012-08-15 | Disposition: A | Discharge status: Home / Self Care | Payer: Medicare Other | Source: Ambulatory Visit | Attending: Internal Medicine | Admitting: Internal Medicine

## 2012-08-15 VITALS — BP 122/66 | HR 69 | Temp 98.2°F | Ht 60.0 in | Wt 178.8 lb

## 2012-08-15 DIAGNOSIS — R05 Cough: Secondary | ICD-10-CM

## 2012-08-15 DIAGNOSIS — J9610 Chronic respiratory failure, unspecified whether with hypoxia or hypercapnia: Secondary | ICD-10-CM | POA: Diagnosis not present

## 2012-08-15 DIAGNOSIS — J189 Pneumonia, unspecified organism: Secondary | ICD-10-CM | POA: Diagnosis not present

## 2012-08-15 DIAGNOSIS — J449 Chronic obstructive pulmonary disease, unspecified: Secondary | ICD-10-CM | POA: Diagnosis not present

## 2012-08-15 DIAGNOSIS — R059 Cough, unspecified: Secondary | ICD-10-CM | POA: Insufficient documentation

## 2012-08-15 NOTE — Progress Notes (Signed)
Subjective:     Patient ID: [name], female   DOB: 1936-03-21   MRN: [id number]  HPI  59 yowf  Quit smoking after admit [facility]/2011    Admit date: 07/06/2011  Discharge date: 07/13/2011  Bronchitis  Healthcare-associated pneumonia  Chest pain  Leukocytosis   04/10/2012 new pt eval  ov/Ilea Hilton cc doe x 100 ft on RA , maybe 200 ft on 02 and has not done rehab to date, assoc with sense of chest congestion but no purulent sputum. Overall pattern is worse since quit smoking despite rx with symbicort and spiriva.   rec Symbicort 160 Take 2 puffs first thing in am and then another 2 puffs about 12 hours later.  spiriva one each am Work on inhaler technique:   05/15/2012 f/u ov/Stetson Pelaez cc no change doe esp in am and typically  using saba hfa by mid day every day plus   Neb saba " if over does it. " rec 02 at hs and exerting Pulmonary rehab, reduce saba  08/15/2012 f/u ov/Kionna Brier re copd/ gold III with 14 lb wt gain since quit smoking  Chief Complaint  Patient presents with  . Follow-up    Breathing seems to have improved with pulmonary rehab.   no need at all for saba,  occ cough fits like choking, feels intermittent   lump  in throat, no am pattern, no excess mucus, has seen Annalee Genta previously, takes ppi daily and prn zyrtec not clearly helping but doing great with rehab.   No obvious daytime variabilty or   cp or chest tightness, subjective wheeze overt sinus or hb symptoms. No unusual exp hx   Sleeping ok without nocturnal  or early am exacerbation  of respiratory  c/o's or need for noct saba. Also denies any obvious fluctuation of symptoms with weather or environmental changes or other aggravating or alleviating factors except as outlined above    ROS  The following are not active complaints unless bolded sore throat, dysphagia, dental problems, itching, sneezing,  nasal congestion or excess/ purulent secretions, ear ache,   fever, chills, sweats, unintended wt loss, pleuritic or  exertional cp, hemoptysis,  orthopnea pnd or leg swelling, presyncope, palpitations, heartburn, abdominal pain, anorexia, nausea, vomiting, diarrhea  or change in bowel or urinary habits, change in stools or urine, dysuria,hematuria,  rash, arthralgias, visual complaints, headache, numbness weakness or ataxia or problems with walking or coordination,  change in mood/affect or memory.           Past Medical History:  Severe reflux with dysphagia  Hyperlipidemia    Family History:  Negative for respiratory diseases or atopy                   Objective:   Physical Exam Wt 178 05/15/2012  178 > 08/15/2012  179 Wt Readings from Last 3 Encounters:  04/10/12 179 lb 6.4 oz (81.375 kg)  07/13/11 173 lb 4.5 oz (78.6 kg)  06/29/11 165 lb 5.5 oz (75 kg)      she is a moderately obese pleasant elderly white female who appears about 5 years older than stated age worse with no increased work of breathing walking into the exam room or getting up on the table.HEENT mild turbinate edema. Oropharynx no thrush or excess pnd or cobblestoning. No JVD or cervical adenopathy. Mild accessory muscle hypertrophy. Trachea midline, nl thryroid. Chest was hyperinflated by percussion with diminished breath sounds and moderate increased exp time without wheeze. Hoover sign positive at mid  inspiration. Regular rate and rhythm without murmur gallop or rub or increase P2. Abd: no hsm, nl excursion. Ext warm without Cyanosis clubbing or edema.    CXR  08/15/2012 :  No active cardiopulmonary disease.    Assessment:          Plan:

## 2012-08-15 NOTE — Assessment & Plan Note (Signed)
Cough is not at all typical of copd and has sense of globus while on ppi > rec ent re-eval.

## 2012-08-15 NOTE — Assessment & Plan Note (Signed)
-   hfa 90% 05/15/2012  - PFTs 05/15/2012 FEV1  0.61 ( 36%) ratio 57 and 21% better p B2 and DLCO 44%  106 corrected  - Referred to rehab 05/15/12  Much better on maint rx with symbicort and spiriva, min need for saba hfa and never neb at this point - main symptom appears to be upper airway> refer back to ENT

## 2012-08-15 NOTE — Patient Instructions (Addendum)
Ok to use 02 at bedtime and as needed during the day when exerting  Plan A Symbicort and spiriva  Only use your albuterol (plan B is proaire,  Then Plan C is nebulizer) as a rescue medication to be used if you can't catch your breath by resting or doing a relaxed purse lip breathing pattern. The less you use it, the better it will work when you need it.   Please remember to go to the  x-ray department downstairs for your tests - we will call you with the results when they are available.  See Dr Annalee Genta about your throat and let him check out your sinuses     Please schedule a follow up visit in 6 months but call sooner if needed

## 2012-08-15 NOTE — Assessment & Plan Note (Addendum)
Using at hs and prn only for now with marginal sats today ra and non-adherenct to more consistent rx in past.

## 2012-08-19 ENCOUNTER — Encounter (HOSPITAL_COMMUNITY): Payer: Medicare Other

## 2012-08-19 NOTE — Progress Notes (Signed)
Quick Note:  Spoke with pt and notified of results per Dr. Wert. Pt verbalized understanding and denied any questions.  ______ 

## 2012-08-21 ENCOUNTER — Encounter (HOSPITAL_COMMUNITY)
Admission: RE | Admit: 2012-08-21 | Discharge: 2012-08-21 | Disposition: A | Discharge status: Home / Self Care | Payer: Medicare Other | Source: Ambulatory Visit | Attending: Internal Medicine | Admitting: Internal Medicine

## 2012-08-21 DIAGNOSIS — F17200 Nicotine dependence, unspecified, uncomplicated: Secondary | ICD-10-CM | POA: Insufficient documentation

## 2012-08-21 DIAGNOSIS — E876 Hypokalemia: Secondary | ICD-10-CM | POA: Insufficient documentation

## 2012-08-21 DIAGNOSIS — Z5189 Encounter for other specified aftercare: Secondary | ICD-10-CM | POA: Insufficient documentation

## 2012-08-21 DIAGNOSIS — D72829 Elevated white blood cell count, unspecified: Secondary | ICD-10-CM | POA: Diagnosis not present

## 2012-08-21 DIAGNOSIS — R079 Chest pain, unspecified: Secondary | ICD-10-CM | POA: Diagnosis not present

## 2012-08-21 DIAGNOSIS — J441 Chronic obstructive pulmonary disease with (acute) exacerbation: Secondary | ICD-10-CM | POA: Diagnosis not present

## 2012-08-26 ENCOUNTER — Encounter (HOSPITAL_COMMUNITY): Payer: Medicare Other

## 2012-08-28 ENCOUNTER — Encounter (HOSPITAL_COMMUNITY)
Admission: RE | Admit: 2012-08-28 | Discharge: 2012-08-28 | Disposition: A | Discharge status: Home / Self Care | Payer: Medicare Other | Source: Ambulatory Visit | Attending: Internal Medicine | Admitting: Internal Medicine

## 2012-08-28 NOTE — Progress Notes (Signed)
[name] 77 y.o. female Nutrition Note Spoke with pt. Pt previously given her homework to do and pt has not returned any homework. Per discussion with pt, pt's daughter, [name], "used to help me with all my paperwork." Pt's daughter [name] recently and unexpectedly passed away. Pt states she has her "81 year old granddaughter living with her part of the time." Nutrition Screen and Nutrition Survey completed with pt. Pt to bring in her Pulmonary Rehab homework packet so a staff member can help her complete the information. Pt eats 1 meal a day; most prepared at home. Pt's wt today is 178.5 lb (81.2 kg), which is up 13.5 lb over the past 14 months. Pt reports wt gain due to "all that prednisone I've had to take." Pt wants to lose 30-40 lbs from her current wt. Pt c/o difficulty swallowing due to reflux. Certain foods are especially difficult for pt to swallow (e.g. Meat and bread). Pt states her appetite is decreased because "I'm not hungry after I finish cooking." Ways to help pt appetite at meals discussed. Pt reports she does have a difficult time buying food and medications when "I hit the donut hole." Pt encouraged to talk with her MD re: affordability of medications. Cathie Olden, RN notified. Per discussion with pt, pt c/o bowel incontinence and loose stools over the past 2-2.5 months. Pt states she has an MD appt in June to address this issue. Pt expressed understanding.  Pt does not avoid salty food; uses food that is canned by "my sister and she uses salt."  Pt adds saltto food "if I use salt."   Nutrition Diagnosis   Food-and nutrition-related knowledge deficit related to lack of exposure to information as related to diagnosis of pulmonary disease   Overweight/obesity related to excessive energy intake (no height currently available to calculate BMI) Nutrition Rx/Est. Daily Nutrition Needs for: ? wt loss (no height available at this time to calculate nutrition needs. Kcal   gm protein   1500  mg or less sodium      Nutrition Intervention   Pt's individual nutrition plan and goals reviewed with pt.   Pt to attend the Nutrition and Lung Disease class   Continual client-centered nutrition education by RD, as part of interdisciplinary care. Goal(s) 1. Pt to identify and limit food sources of sodium. 2. Identify food quantities necessary to achieve wt loss of  -2# per week to a goal wt of 70.3-78.5 kg (154-172 lb) at graduation from pulmonary rehab. 3. Describe the benefit of including fruits, vegetables, whole grains, and low-fat dairy products in a healthy meal plan. Monitor and Evaluate progress toward nutrition goal with team.   Mickle Plumb, M.Ed, RD, LDN, CDE 08/28/2012 12:29 PM

## 2012-09-02 ENCOUNTER — Encounter (HOSPITAL_COMMUNITY): Payer: Medicare Other

## 2012-09-04 ENCOUNTER — Encounter (HOSPITAL_COMMUNITY): Payer: Medicare Other

## 2012-09-09 ENCOUNTER — Encounter (HOSPITAL_COMMUNITY): Payer: Medicare Other

## 2012-09-09 ENCOUNTER — Telehealth (HOSPITAL_COMMUNITY): Payer: Self-pay | Admitting: *Deleted

## 2012-09-09 NOTE — Telephone Encounter (Signed)
Telephone call to patient, absent for Pulmonary Rehab since 5/8 2014.  No answer on home phone or cell phone.  Message left asking patient fo call.  Cathie Olden RN

## 2012-09-11 ENCOUNTER — Encounter (HOSPITAL_COMMUNITY)
Admission: RE | Admit: 2012-09-11 | Discharge: 2012-09-11 | Disposition: A | Discharge status: Home / Self Care | Payer: Medicare Other | Source: Ambulatory Visit | Attending: Internal Medicine | Admitting: Internal Medicine

## 2012-09-11 NOTE — Progress Notes (Signed)
Today [name] called out for Pulmonary Rehab visit due to taking care of her granddaughter. Previous calls were made and on 09/10/2012 pm I was able to speak with her on the phone regarding absence from rehab.  She had a GI upset last week and was unable to attend for that reason.  She assured me she wanted to participate in the program because she was beginning to feel better and do more activity from just the short time she has participated.  She was encouraged and supported to return once her medical condition was improved. Cathie Olden RN

## 2012-09-16 ENCOUNTER — Encounter (HOSPITAL_COMMUNITY): Payer: Medicare Other

## 2012-09-18 ENCOUNTER — Encounter (HOSPITAL_COMMUNITY)
Admission: RE | Admit: 2012-09-18 | Discharge: 2012-09-18 | Disposition: A | Discharge status: Home / Self Care | Payer: Medicare Other | Source: Ambulatory Visit | Attending: Internal Medicine | Admitting: Internal Medicine

## 2012-09-18 NOTE — Progress Notes (Signed)
[name] returns to Pulmonary Rehab today from recent GI upset x 2.  She last attended on 08/28/2012.  During our discussion today, she talked about caring for her granddaughter, whose mother passed away unexpectedly recently.  She had picked her granddaughter up from school and the granddaughter was very sick.  She also states she had planned to move to her son[place]laws to help with her granddaughter , however things are not going as smoothly as she had hoped.  Her nephew had moved in with her and now she is paying all the expenses for him to live in her home.  Not sure at this time what she is going to do. She does not seem depressed or upset about this situation, just unsure about what she needs to do.  She did inform me that she will be going to [place] to visit another grandchild about the end of June or in July.  She usually stays for the summer.  Not sure when she will return.  We talked about some options to continue exercising while she is away.  We will give home exercise instruction prior to her leaving if she continues to be stable while exercising.  We will continue to encourage and support.

## 2012-09-23 ENCOUNTER — Encounter (HOSPITAL_COMMUNITY): Admission: RE | Admit: 2012-09-23 | Payer: Medicare Other | Source: Ambulatory Visit

## 2012-09-23 DIAGNOSIS — E782 Mixed hyperlipidemia: Secondary | ICD-10-CM | POA: Diagnosis not present

## 2012-09-23 DIAGNOSIS — M545 Low back pain: Secondary | ICD-10-CM | POA: Diagnosis not present

## 2012-09-23 DIAGNOSIS — Z23 Encounter for immunization: Secondary | ICD-10-CM | POA: Diagnosis not present

## 2012-09-23 DIAGNOSIS — J449 Chronic obstructive pulmonary disease, unspecified: Secondary | ICD-10-CM | POA: Diagnosis not present

## 2012-09-23 DIAGNOSIS — F329 Major depressive disorder, single episode, unspecified: Secondary | ICD-10-CM | POA: Diagnosis not present

## 2012-09-23 DIAGNOSIS — K219 Gastro-esophageal reflux disease without esophagitis: Secondary | ICD-10-CM | POA: Diagnosis not present

## 2012-09-23 DIAGNOSIS — J309 Allergic rhinitis, unspecified: Secondary | ICD-10-CM | POA: Diagnosis not present

## 2012-09-23 DIAGNOSIS — M949 Disorder of cartilage, unspecified: Secondary | ICD-10-CM | POA: Diagnosis not present

## 2012-09-25 ENCOUNTER — Encounter (HOSPITAL_COMMUNITY)
Admission: RE | Admit: 2012-09-25 | Discharge: 2012-09-25 | Disposition: A | Discharge status: Home / Self Care | Payer: Medicare Other | Source: Ambulatory Visit | Attending: Internal Medicine | Admitting: Internal Medicine

## 2012-09-25 DIAGNOSIS — R079 Chest pain, unspecified: Secondary | ICD-10-CM | POA: Insufficient documentation

## 2012-09-25 DIAGNOSIS — E876 Hypokalemia: Secondary | ICD-10-CM | POA: Diagnosis not present

## 2012-09-25 DIAGNOSIS — F17200 Nicotine dependence, unspecified, uncomplicated: Secondary | ICD-10-CM | POA: Diagnosis not present

## 2012-09-25 DIAGNOSIS — Z5189 Encounter for other specified aftercare: Secondary | ICD-10-CM | POA: Insufficient documentation

## 2012-09-25 DIAGNOSIS — J441 Chronic obstructive pulmonary disease with (acute) exacerbation: Secondary | ICD-10-CM | POA: Diagnosis not present

## 2012-09-25 DIAGNOSIS — D72829 Elevated white blood cell count, unspecified: Secondary | ICD-10-CM | POA: Diagnosis not present

## 2012-09-25 NOTE — Progress Notes (Signed)
[name] 77 y.o. female Nutrition Note Spoke with pt. Pt's homework completed with pt. Nutrition Survey reviewed. Pt making many healthy food choices. Ways to improve upon healthy eating discussed. Pt expressed understanding. Nutrition Diagnosis   Food-and nutrition-related knowledge deficit related to lack of exposure to information as related to diagnosis of pulmonary disease   Obesity related to excessive energy intake as indicated by BMI of 34.4 Nutrition Rx/Est. Daily Nutrition Needs for: ? wt loss 1200-1500 Kcal  65-80 gm protein   1500 mg or less sodium      Nutrition Intervention   Pt's Rate Your Plate nutrition survey reviewed with pt.   Pt to attend the Nutrition and Lung Disease class   Continual client-centered nutrition education by RD, as part of interdisciplinary care. Goal(s) 1. Pt to identify and limit food sources of sodium. 2. Identify food quantities necessary to achieve wt loss of  -2# per week to a goal wt of 70.3-78.5 kg (154-172 lb) at graduation from pulmonary rehab. 3. Describe the benefit of including fruits, vegetables, whole grains, and low-fat dairy products in a healthy meal plan. Monitor and Evaluate progress toward nutrition goal with team.   Mickle Plumb, M.Ed, RD, LDN, CDE 09/25/2012 2:44 PM

## 2012-09-30 ENCOUNTER — Encounter (HOSPITAL_COMMUNITY): Payer: Medicare Other

## 2012-10-02 ENCOUNTER — Encounter (HOSPITAL_COMMUNITY)
Admission: RE | Admit: 2012-10-02 | Discharge: 2012-10-02 | Disposition: A | Discharge status: Home / Self Care | Payer: Medicare Other | Source: Ambulatory Visit | Attending: Internal Medicine | Admitting: Internal Medicine

## 2012-10-07 ENCOUNTER — Encounter (HOSPITAL_COMMUNITY): Payer: Medicare Other

## 2012-10-09 ENCOUNTER — Encounter (HOSPITAL_COMMUNITY)
Admission: RE | Admit: 2012-10-09 | Discharge: 2012-10-09 | Disposition: A | Discharge status: Home / Self Care | Payer: Medicare Other | Source: Ambulatory Visit | Attending: Internal Medicine | Admitting: Internal Medicine

## 2012-10-09 NOTE — Progress Notes (Signed)
Ms. Sachs came today for exercise in Pulmonary Rehab.  She informed staff on her arrival that today would be her last day of attendance.  She is leaving next week to visit her daughter in [place].  She will return sometime in August.  She is emotional today, she has not seen her daughter in a long time and she is very anxious to spend time there with her family.  She is reassured and supported in her decision.  We discussed options for returning to rehab after she returns later this summer. She feels this has helped her so much in her ability to do more physical activity and for the socialization and emotional support she has received while being a patient in Pulmonary Rehab.  She has completed final paperwork, and a 6 min walk test was done.  She improved her distance of 780 feet at entry on 08/14/12 to 901 feet today.  She maintained oxygen levels at 92-93% on oxygen @ 2L/min . Reassured we will be happy to readmit when she returns to [place].  Cathie Olden RN

## 2012-10-14 ENCOUNTER — Encounter (HOSPITAL_COMMUNITY): Payer: Medicare Other

## 2012-10-16 ENCOUNTER — Encounter (HOSPITAL_COMMUNITY): Payer: Medicare Other

## 2012-10-21 ENCOUNTER — Encounter (HOSPITAL_COMMUNITY): Payer: Medicare Other

## 2012-10-23 ENCOUNTER — Encounter (HOSPITAL_COMMUNITY): Payer: Medicare Other

## 2012-10-28 ENCOUNTER — Encounter (HOSPITAL_COMMUNITY): Payer: Medicare Other

## 2012-10-30 ENCOUNTER — Encounter (HOSPITAL_COMMUNITY): Payer: Medicare Other

## 2012-11-04 ENCOUNTER — Encounter (HOSPITAL_COMMUNITY): Payer: Medicare Other

## 2012-11-06 ENCOUNTER — Encounter (HOSPITAL_COMMUNITY): Payer: Medicare Other

## 2012-11-11 ENCOUNTER — Encounter (HOSPITAL_COMMUNITY): Payer: Medicare Other

## 2012-11-13 ENCOUNTER — Encounter (HOSPITAL_COMMUNITY): Payer: Medicare Other

## 2012-11-18 ENCOUNTER — Encounter (HOSPITAL_COMMUNITY): Payer: Medicare Other

## 2012-11-20 ENCOUNTER — Encounter (HOSPITAL_COMMUNITY): Payer: Medicare Other

## 2012-11-25 ENCOUNTER — Encounter (HOSPITAL_COMMUNITY): Payer: Medicare Other

## 2012-11-27 ENCOUNTER — Encounter (HOSPITAL_COMMUNITY): Payer: Medicare Other

## 2012-12-02 ENCOUNTER — Encounter (HOSPITAL_COMMUNITY): Payer: Medicare Other

## 2013-02-11 ENCOUNTER — Encounter: Payer: Self-pay | Admitting: Internal Medicine

## 2013-02-11 ENCOUNTER — Ambulatory Visit (INDEPENDENT_AMBULATORY_CARE_PROVIDER_SITE_OTHER): Payer: Medicare Other | Admitting: Internal Medicine

## 2013-02-11 VITALS — BP 124/78 | HR 67 | Temp 97.8°F | Ht 60.0 in | Wt 179.4 lb

## 2013-02-11 DIAGNOSIS — J449 Chronic obstructive pulmonary disease, unspecified: Secondary | ICD-10-CM

## 2013-02-11 DIAGNOSIS — J9610 Chronic respiratory failure, unspecified whether with hypoxia or hypercapnia: Secondary | ICD-10-CM | POA: Diagnosis not present

## 2013-02-11 NOTE — Progress Notes (Signed)
Subjective:     Patient ID: [name], female   DOB: 02-Nov-1935   MRN: [id number]    Brief patient profile:  30 yowf  Quit smoking after admit [facility] 06/2011     History of Present Illness  Admit date: 07/06/2011  Discharge date: 07/13/2011  Bronchitis  Healthcare-associated pneumonia  Chest pain  Leukocytosis   04/10/2012 new pt eval  ov/Delance Weide cc doe x 100 ft on RA , maybe 200 ft on 02 and has not done rehab to date, assoc with sense of chest congestion but no purulent sputum. Overall pattern is worse since quit smoking despite rx with symbicort and spiriva.   rec Symbicort 160 Take 2 puffs first thing in am and then another 2 puffs about 12 hours later.  spiriva one each am Work on inhaler technique:   05/15/2012 f/u ov/Ireoluwa Grant cc no change doe esp in am and typically  using saba hfa by mid day every day plus   Neb saba " if over does it. " rec 02 at hs and exerting Pulmonary rehab, reduce saba  08/15/2012 f/u ov/Aldwin Micalizzi re copd/ gold III with 14 lb wt gain since quit smoking  Chief Complaint  Patient presents with  . Follow-up    Breathing seems to have improved with pulmonary rehab.   no need at all for saba,  occ cough fits like choking, feels intermittent   lump  in throat, no am pattern, no excess mucus, has seen Annalee Genta previously, takes ppi daily and prn zyrtec not clearly helping but doing great with rehab.  rec Ok to use 02 at bedtime and as needed during the day when exerting  Plan A Symbicort and spiriva  Only use your albuterol (plan B is proaire,  Then Plan C is nebulizer)   02/11/2013 f/u ov/Arsalan Brisbin re: GOLD III copd  Chief Complaint  Patient presents with  . Follow-up    Pt c/o SOB with activity and household chores.  No wheezing or chest tightness since cardiac rehab.  No cough or sinus congestion.   only using 02 with exertion/ shopping  And hs  Rarely needs saba hfa more than a few times a week at most while on spiriva/symbicort and no neb use at all  now    No obvious day to day or daytime variabilty or assoc chronic cough or cp or chest tightness, subjective wheeze overt sinus or hb symptoms. No unusual exp hx or h/o childhood pna/ asthma or knowledge of premature birth.  Sleeping ok without nocturnal  or early am exacerbation  of respiratory  c/o's or need for noct saba. Also denies any obvious fluctuation of symptoms with weather or environmental changes or other aggravating or alleviating factors except as outlined above   Current Medications, Allergies, Complete Past Medical History, Past Surgical History, Family History, and Social History were reviewed in [employer] record.  ROS  The following are not active complaints unless bolded sore throat, dysphagia, dental problems, itching, sneezing,  nasal congestion or excess/ purulent secretions, ear ache,   fever, chills, sweats, unintended wt loss, pleuritic or exertional cp, hemoptysis,  orthopnea pnd or leg swelling, presyncope, palpitations, heartburn, abdominal pain, anorexia, nausea, vomiting, diarrhea  or change in bowel or urinary habits, change in stools or urine, dysuria,hematuria,  rash, arthralgias, visual complaints, headache, numbness weakness or ataxia or problems with walking or coordination,  change in mood/affect or memory.  Past Medical History:  Severe reflux with dysphagia  Hyperlipidemia    Family History:  Negative for respiratory diseases or atopy                   Objective:   Physical Exam Wt 178 05/15/2012  178 > 08/15/2012  179 > 02/11/2013 179  Wt Readings from Last 3 Encounters:  04/10/12 179 lb 6.4 oz (81.375 kg)  07/13/11 173 lb 4.5 oz (78.6 kg)  06/29/11 165 lb 5.5 oz (75 kg)      she is a moderately obese pleasant elderly white female who appears about 5 years older than stated age worse with no increased work of breathing walking from lobby  into the exam room with  sats 93% RA    .HEENT mild  turbinate edema. Oropharynx no thrush or excess pnd or cobblestoning. No JVD or cervical adenopathy. Mild accessory muscle hypertrophy. Trachea midline, nl thryroid. Chest was hyperinflated by percussion with diminished breath sounds and moderate increased exp time without wheeze. Hoover sign positive at mid inspiration. Regular rate and rhythm without murmur gallop or rub or increase P2. Abd: no hsm, nl excursion. Ext warm without Cyanosis clubbing or edema.      CXR  08/15/2012 :  No active cardiopulmonary disease.    Assessment:

## 2013-02-11 NOTE — Patient Instructions (Addendum)
No change in maintenance program   If you are satisfied with your treatment plan let your doctor know and he/she can either refill your medications or you can return here when your prescription runs out.     If in any way you are not 100% satisfied,  please tell us.  If 100% better, tell your friends!

## 2013-02-12 DIAGNOSIS — Z23 Encounter for immunization: Secondary | ICD-10-CM | POA: Diagnosis not present

## 2013-02-12 NOTE — Assessment & Plan Note (Signed)
-   Placed on 24 h 02 at discharge from [facility] 06/2011 but not adherent - Rx as of 02/11/13 is 02 at hs and outside exertion at 2lpm  Adequate control on present rx, reviewed > no change in rx needed

## 2013-02-12 NOTE — Assessment & Plan Note (Signed)
-   hfa 90% 05/15/2012  - PFTs 05/15/2012 FEV1  0.61 ( 36%) ratio 57 and 21% better p B2 and DLCO 44%  106 corrected  - Referred to rehab 05/15/12 - Rec pulmonary f/u prn 02/11/13   Despite severity doing remarkably well p rehab and min need for saba so pulmonary f/u can be prn    Each maintenance medication was reviewed in detail including most importantly the difference between maintenance and as needed and under what circumstances the prns are to be used.  Please see instructions for details which were reviewed in writing and the patient given a copy.

## 2013-03-30 DIAGNOSIS — R894 Abnormal immunological findings in specimens from other organs, systems and tissues: Secondary | ICD-10-CM | POA: Diagnosis not present

## 2013-03-30 DIAGNOSIS — F17200 Nicotine dependence, unspecified, uncomplicated: Secondary | ICD-10-CM | POA: Diagnosis not present

## 2013-03-30 DIAGNOSIS — E782 Mixed hyperlipidemia: Secondary | ICD-10-CM | POA: Diagnosis not present

## 2013-03-30 DIAGNOSIS — Z01419 Encounter for gynecological examination (general) (routine) without abnormal findings: Secondary | ICD-10-CM | POA: Diagnosis not present

## 2013-03-30 DIAGNOSIS — M899 Disorder of bone, unspecified: Secondary | ICD-10-CM | POA: Diagnosis not present

## 2013-03-30 DIAGNOSIS — M2550 Pain in unspecified joint: Secondary | ICD-10-CM | POA: Diagnosis not present

## 2013-03-30 DIAGNOSIS — J449 Chronic obstructive pulmonary disease, unspecified: Secondary | ICD-10-CM | POA: Diagnosis not present

## 2013-03-30 DIAGNOSIS — Z0000 Encounter for general adult medical examination without abnormal findings: Secondary | ICD-10-CM | POA: Diagnosis not present

## 2013-03-30 DIAGNOSIS — F329 Major depressive disorder, single episode, unspecified: Secondary | ICD-10-CM | POA: Diagnosis not present

## 2013-03-30 DIAGNOSIS — K219 Gastro-esophageal reflux disease without esophagitis: Secondary | ICD-10-CM | POA: Diagnosis not present

## 2013-04-06 ENCOUNTER — Other Ambulatory Visit: Payer: Self-pay

## 2013-04-06 DIAGNOSIS — Z1231 Encounter for screening mammogram for malignant neoplasm of breast: Secondary | ICD-10-CM

## 2013-04-27 DIAGNOSIS — M949 Disorder of cartilage, unspecified: Secondary | ICD-10-CM | POA: Diagnosis not present

## 2013-04-30 DIAGNOSIS — R5381 Other malaise: Secondary | ICD-10-CM | POA: Diagnosis not present

## 2013-04-30 DIAGNOSIS — M2550 Pain in unspecified joint: Secondary | ICD-10-CM | POA: Diagnosis not present

## 2013-04-30 DIAGNOSIS — R5383 Other fatigue: Secondary | ICD-10-CM | POA: Diagnosis not present

## 2013-04-30 DIAGNOSIS — M79609 Pain in unspecified limb: Secondary | ICD-10-CM | POA: Diagnosis not present

## 2013-04-30 DIAGNOSIS — M19049 Primary osteoarthritis, unspecified hand: Secondary | ICD-10-CM | POA: Diagnosis not present

## 2013-05-20 ENCOUNTER — Ambulatory Visit: Payer: Medicare Other

## 2013-05-20 DIAGNOSIS — M2550 Pain in unspecified joint: Secondary | ICD-10-CM | POA: Diagnosis not present

## 2013-05-20 DIAGNOSIS — M79609 Pain in unspecified limb: Secondary | ICD-10-CM | POA: Diagnosis not present

## 2013-05-20 DIAGNOSIS — R5381 Other malaise: Secondary | ICD-10-CM | POA: Diagnosis not present

## 2013-05-20 DIAGNOSIS — R5383 Other fatigue: Secondary | ICD-10-CM | POA: Diagnosis not present

## 2013-05-27 ENCOUNTER — Ambulatory Visit
Admission: RE | Admit: 2013-05-27 | Discharge: 2013-05-27 | Disposition: A | Discharge status: Home / Self Care | Payer: Medicare Other | Source: Ambulatory Visit

## 2013-05-27 DIAGNOSIS — Z1231 Encounter for screening mammogram for malignant neoplasm of breast: Secondary | ICD-10-CM | POA: Diagnosis not present

## 2013-07-01 DIAGNOSIS — M19049 Primary osteoarthritis, unspecified hand: Secondary | ICD-10-CM | POA: Diagnosis not present

## 2013-07-01 DIAGNOSIS — R894 Abnormal immunological findings in specimens from other organs, systems and tissues: Secondary | ICD-10-CM | POA: Diagnosis not present

## 2013-07-01 DIAGNOSIS — M1990 Unspecified osteoarthritis, unspecified site: Secondary | ICD-10-CM | POA: Diagnosis not present

## 2013-07-01 DIAGNOSIS — M2550 Pain in unspecified joint: Secondary | ICD-10-CM | POA: Diagnosis not present

## 2013-08-11 ENCOUNTER — Ambulatory Visit (INDEPENDENT_AMBULATORY_CARE_PROVIDER_SITE_OTHER): Payer: Medicare Other | Admitting: Family Medicine

## 2013-08-11 VITALS — BP 128/82 | HR 80 | Temp 98.1°F | Resp 16 | Ht 60.0 in | Wt 185.2 lb

## 2013-08-11 DIAGNOSIS — R350 Frequency of micturition: Secondary | ICD-10-CM | POA: Diagnosis not present

## 2013-08-11 DIAGNOSIS — R300 Dysuria: Secondary | ICD-10-CM | POA: Diagnosis not present

## 2013-08-11 DIAGNOSIS — R309 Painful micturition, unspecified: Secondary | ICD-10-CM

## 2013-08-11 LAB — POCT UA - MICROSCOPIC ONLY
Bacteria, U Microscopic: NEGATIVE
Casts, Ur, LPF, POC: NEGATIVE
Crystals, Ur, HPF, POC: NEGATIVE
Mucus, UA: NEGATIVE
Yeast, UA: NEGATIVE

## 2013-08-11 LAB — POCT URINALYSIS DIPSTICK
Bilirubin, UA: NEGATIVE
Glucose, UA: NEGATIVE
Ketones, UA: NEGATIVE
Nitrite, UA: NEGATIVE
Protein, UA: NEGATIVE
Spec Grav, UA: 1.01
Urobilinogen, UA: 0.2
pH, UA: 7

## 2013-08-11 MED ORDER — CEPHALEXIN 500 MG PO CAPS: 500.0000 mg | ORAL_CAPSULE | Freq: Two times a day (BID) | ORAL | Status: DC

## 2013-08-11 MED ORDER — CEPHALEXIN 500 MG PO CAPS: 500.0000 mg | ORAL_CAPSULE | Freq: Four times a day (QID) | ORAL | Status: DC

## 2013-08-11 NOTE — Patient Instructions (Signed)
Use the keflex as directed (twice a day for one week) for UTI.  I will be in touch with your urine culture when it comes in.  If you are getting worse in the meantime please call me or come back in!

## 2013-08-11 NOTE — Progress Notes (Addendum)
[facility] [address] [place]  [address] [phone]  Date:  08/11/2013   Name:  [name]   DOB:  02-11-1936   MRN:  [id number]  PCP:  [name], MD    Chief Complaint: Urinary Tract Infection   History of Present Illness:  [name] is a 78 y.o. very pleasant female patient who presents with the following:  Here today as a new patient.  She thinks she may have a UTI.  She has noted a feeling of urinary urgancy, dysuria.   She has noted this for a couple of weeks.  She has used azo OTC- it helps temporarily but her sx return.  She has not noted any fever or chills.  She does have back pain but this is normal for her- she has "a real bad back."   She notes pain in her vulva.  This is not typical of her UTI- however otherwise this is like a normal UTI in her opinion.   She has a history of COPD, cough.  Former smoker  Patient Active Problem List   Diagnosis Date Noted  . Cough 08/15/2012  . Chronic respiratory failure 04/10/2012  . Constipation 07/03/2011  . Nicotine dependence 07/01/2011  . COPD with acute exacerbation 06/29/2011  . COPD GOLD III 06/19/2007  . HYPERLIPIDEMIA 05/15/2007  . ACID REFLUX DISEASE 05/15/2007    Past Medical History  Diagnosis Date  . COPD (chronic obstructive pulmonary disease)   . Asthma   . GERD (gastroesophageal reflux disease)   . PONV (postoperative nausea and vomiting)   . Arthritis   . Allergy     Past Surgical History  Procedure Laterality Date  . Abdominal hysterectomy    . Tonsillectomy    . Appendectomy    . Back surgery    . Gallbladder surgery      History  Substance Use Topics  . Smoking status: Former Smoker -- 0.25 packs/day for 50 years    Types: Cigarettes    Quit date: 06/25/2011  . Smokeless tobacco: Never Used  . Alcohol Use: No    Family History  Problem Relation Age of Onset  . Asthma Maternal Grandmother     Allergies  Allergen Reactions  . Kapidex  [Dexlansoprazole]     Stomach pain, diarrhea  . Zegerid [Omeprazole]     Burning in chest    Medication list has been reviewed and updated.  Current Outpatient Prescriptions on File Prior to Visit  Medication Sig Dispense Refill  . albuterol (PROVENTIL HFA;VENTOLIN HFA) 108 (90 BASE) MCG/ACT inhaler Inhale 2 puffs into the lungs every 6 (six) hours as needed. For shortness of breath.      Marland Kitchen albuterol (PROVENTIL) (2.5 MG/3ML) 0.083% nebulizer solution Take 2.5 mg by nebulization 2 (two) times daily as needed. For shortness of breath.      Marland Kitchen aspirin 81 MG chewable tablet Chew 81 mg by mouth daily.      Marland Kitchen atorvastatin (LIPITOR) 40 MG tablet Take 40 mg by mouth daily.      . budesonide-formoterol (SYMBICORT) 160-4.5 MCG/ACT inhaler Inhale 2 puffs into the lungs 2 (two) times daily.      . Calcium Carbonate-Vitamin D (CALCIUM + D PO) Take 1 tablet by mouth daily.       . cetirizine (ZYRTEC) 10 MG tablet Take 10 mg by mouth daily.      . Cholecalciferol (VITAMIN D) 2000 UNITS tablet Take 2,000 Units by mouth daily.      Marland Kitchen  Cyanocobalamin (VITAMIN B-12) 1000 MCG SUBL Place 1 tablet under the tongue daily.      Marland Kitchen gabapentin (NEURONTIN) 100 MG capsule 1-2 in am, 3 in pm      . omeprazole (PRILOSEC) 20 MG capsule Take 20 mg by mouth daily.      . sertraline (ZOLOFT) 50 MG tablet Take 100 mg by mouth daily.       Marland Kitchen tiotropium (SPIRIVA) 18 MCG inhalation capsule Place 18 mcg into inhaler and inhale daily.       No current facility-administered medications on file prior to visit.    Review of Systems:  As per HPI- otherwise negative.   Physical Examination: Filed Vitals:   08/11/13 1350  BP: 128/82  Pulse: 80  Temp: 98.1 F (36.7 C)  Resp: 16   Filed Vitals:   08/11/13 1350  Height: 5' (1.524 m)  Weight: 185 lb 3.2 oz (84.006 kg)   Body mass index is 36.17 kg/(m^2). Ideal Body Weight: Weight in (lb) to have BMI = 25: 127.7  GEN: WDWN, NAD, Non-toxic, A & O x 3, obese, looks  well HEENT: Atraumatic, Normocephalic. Neck supple. No masses, No LAD. Ears and Nose: No external deformity. CV: RRR, No M/G/R. No JVD. No thrill. No extra heart sounds. PULM: CTA B, no wheezes, crackles, rhonchi. No retractions. No resp. distress. No accessory muscle use. ABD: S, NT, ND, +BS. No rebound. No HSM. EXTR: No c/c/e NEURO Normal gait.  PSYCH: Normally interactive. Conversant. Not depressed or anxious appearing.  Calm demeanor.  Gu:  Normal external exam.  Performed gentle speculum exam which is also normal.  No lesion, discharge or other abnormality.  BME is normal  Results for orders placed in visit on 08/11/13  POCT UA - MICROSCOPIC ONLY      Result Value Ref Range   WBC, Ur, HPF, POC 0-1     RBC, urine, microscopic 0-2     Bacteria, U Microscopic neg     Mucus, UA neg     Epithelial cells, urine per micros 0-4     Crystals, Ur, HPF, POC neg     Casts, Ur, LPF, POC neg     Yeast, UA neg    POCT URINALYSIS DIPSTICK      Result Value Ref Range   Color, UA yellow     Clarity, UA clear     Glucose, UA neg     Bilirubin, UA neg     Ketones, UA neg     Spec Grav, UA 1.010     Blood, UA trace     pH, UA 7.0     Protein, UA neg     Urobilinogen, UA 0.2     Nitrite, UA neg     Leukocytes, UA small (1+)      Assessment and Plan: Frequent urination - Plan: POCT UA - Microscopic Only, POCT urinalysis dipstick, cephALEXin (KEFLEX) 500 MG capsule, DISCONTINUED: cephALEXin (KEFLEX) 500 MG capsule  Painful urination - Plan: POCT UA - Microscopic Only, POCT urinalysis dipstick, Urine culture  Treat for likely UTI with keflex twice a day for one week. Her UA and micro are not definite but her urine is dilute.  Close follow-up if not better or if getting worse   Signed Lamar Blinks, MD  4/24: received urine culture.  It is negative for infection.  She notes that she still has some frequency and discomfort again.  She plans to come and see me on Monday- cannot come any  sooner due to work.    Results for orders placed in visit on 08/11/13  URINE CULTURE      Result Value Ref Range   Colony Count NO GROWTH     Organism ID, Bacteria NO GROWTH    POCT UA - MICROSCOPIC ONLY      Result Value Ref Range   WBC, Ur, HPF, POC 0-1     RBC, urine, microscopic 0-2     Bacteria, U Microscopic neg     Mucus, UA neg     Epithelial cells, urine per micros 0-4     Crystals, Ur, HPF, POC neg     Casts, Ur, LPF, POC neg     Yeast, UA neg    POCT URINALYSIS DIPSTICK      Result Value Ref Range   Color, UA yellow     Clarity, UA clear     Glucose, UA neg     Bilirubin, UA neg     Ketones, UA neg     Spec Grav, UA 1.010     Blood, UA trace     pH, UA 7.0     Protein, UA neg     Urobilinogen, UA 0.2     Nitrite, UA neg     Leukocytes, UA small (1+)

## 2013-08-12 LAB — URINE CULTURE
Colony Count: NO GROWTH
Organism ID, Bacteria: NO GROWTH

## 2013-08-17 ENCOUNTER — Ambulatory Visit (INDEPENDENT_AMBULATORY_CARE_PROVIDER_SITE_OTHER): Payer: Medicare Other | Admitting: Family Medicine

## 2013-08-17 VITALS — BP 128/88 | HR 64 | Temp 97.9°F | Resp 18 | Wt 187.6 lb

## 2013-08-17 DIAGNOSIS — R109 Unspecified abdominal pain: Secondary | ICD-10-CM | POA: Diagnosis not present

## 2013-08-17 DIAGNOSIS — R300 Dysuria: Secondary | ICD-10-CM | POA: Diagnosis not present

## 2013-08-17 DIAGNOSIS — R350 Frequency of micturition: Secondary | ICD-10-CM

## 2013-08-17 LAB — POCT URINALYSIS DIPSTICK
Bilirubin, UA: NEGATIVE
GLUCOSE UA: NEGATIVE
Ketones, UA: NEGATIVE
Nitrite, UA: NEGATIVE
PH UA: 5.5
Protein, UA: 100
RBC UA: NEGATIVE
SPEC GRAV UA: 1.025
Urobilinogen, UA: 0.2

## 2013-08-17 LAB — POCT CBC
Granulocyte percent: 69.6 %G (ref 37–80)
HCT, POC: 40 % (ref 37.7–47.9)
HEMOGLOBIN: 12.5 g/dL (ref 12.2–16.2)
LYMPH, POC: 2 (ref 0.6–3.4)
MCH: 29.6 pg (ref 27–31.2)
MCHC: 31.3 g/dL — AB (ref 31.8–35.4)
MCV: 94.6 fL (ref 80–97)
MID (CBC): 0.4 (ref 0–0.9)
MPV: 8.9 fL (ref 0–99.8)
PLATELET COUNT, POC: 355 10*3/uL (ref 142–424)
POC Granulocyte: 5.6 (ref 2–6.9)
POC LYMPH PERCENT: 25.2 %L (ref 10–50)
POC MID %: 5.2 % (ref 0–12)
RBC: 4.23 M/uL (ref 4.04–5.48)
RDW, POC: 14.4 %
WBC: 8 10*3/uL (ref 4.6–10.2)

## 2013-08-17 LAB — POCT WET PREP WITH KOH
Clue Cells Wet Prep HPF POC: NEGATIVE
KOH PREP POC: NEGATIVE
RBC WET PREP PER HPF POC: NEGATIVE
Trichomonas, UA: NEGATIVE
YEAST WET PREP PER HPF POC: NEGATIVE

## 2013-08-17 LAB — POCT UA - MICROSCOPIC ONLY
CASTS, UR, LPF, POC: NEGATIVE
CRYSTALS, UR, HPF, POC: NEGATIVE
MUCUS UA: NEGATIVE
Yeast, UA: NEGATIVE

## 2013-08-17 MED ORDER — FOSFOMYCIN TROMETHAMINE 3 G PO PACK: 3.0000 g | PACK | Freq: Once | ORAL | Status: DC

## 2013-08-17 NOTE — Patient Instructions (Addendum)
You can stop taking the cephalexin now if you wish.  Take the fosfomycin antibiotic once for likely UTI. I will be in touch as soon as I know more.  If you have any other problems come up please let me know

## 2013-08-17 NOTE — Progress Notes (Addendum)
[facility] [address] [place] [address] [phone]  Date:  08/17/2013   Name:  [name]   DOB:  Dec 20, 1935   MRN:  [id number]  PCP:  [name], MD    Chief Complaint: Follow-up   History of Present Illness:  [name] is a 78 y.o. very pleasant female patient who presents with the following:  She was seen here 6 days ago with possible UTI and pain in her vulva.  Her exam and urine were relatively benign at that time, but we treated her with keflex while awaiting culture.  Called her on the 24th to let her know that her urine culture was negative.  She noted persistent sx and planned to come in for a recheck today.  She notes pain in her genital area/ urethra both with urination and other times. She also notes that she has been passing stool when she goes to urinate sometimes; she will wipe and see stool as well She has not noted a fever.   She has noted some pain in her lower abdomen. She has not seen any blood in her urine, but did see a bit of blood when she wiped a couple of times.   No nausea or vomiting; she does have back pain but this is baseline for her.  She is just finishing up her keflex- she has 1 day more.  She is eating normally    Patient Active Problem List   Diagnosis Date Noted  . Cough 08/15/2012  . Chronic respiratory failure 04/10/2012  . Constipation 07/03/2011  . Nicotine dependence 07/01/2011  . COPD with acute exacerbation 06/29/2011  . COPD GOLD III 06/19/2007  . HYPERLIPIDEMIA 05/15/2007  . ACID REFLUX DISEASE 05/15/2007    Past Medical History  Diagnosis Date  . COPD (chronic obstructive pulmonary disease)   . Asthma   . GERD (gastroesophageal reflux disease)   . PONV (postoperative nausea and vomiting)   . Arthritis   . Allergy     Past Surgical History  Procedure Laterality Date  . Abdominal hysterectomy    . Tonsillectomy    . Appendectomy    . Back surgery    . Gallbladder surgery       History  Substance Use Topics  . Smoking status: Former Smoker -- 0.25 packs/day for 50 years    Types: Cigarettes    Quit date: 06/25/2011  . Smokeless tobacco: Never Used  . Alcohol Use: No    Family History  Problem Relation Age of Onset  . Asthma Maternal Grandmother     Allergies  Allergen Reactions  . Kapidex [Dexlansoprazole]     Stomach pain, diarrhea  . Zegerid [Omeprazole]     Burning in chest    Medication list has been reviewed and updated.  Current Outpatient Prescriptions on File Prior to Visit  Medication Sig Dispense Refill  . albuterol (PROVENTIL HFA;VENTOLIN HFA) 108 (90 BASE) MCG/ACT inhaler Inhale 2 puffs into the lungs every 6 (six) hours as needed. For shortness of breath.      Marland Kitchen albuterol (PROVENTIL) (2.5 MG/3ML) 0.083% nebulizer solution Take 2.5 mg by nebulization 2 (two) times daily as needed. For shortness of breath.      Marland Kitchen aspirin 81 MG chewable tablet Chew 81 mg by mouth daily.      Marland Kitchen atorvastatin (LIPITOR) 40 MG tablet Take 40 mg by mouth daily.      . budesonide-formoterol (SYMBICORT) 160-4.5 MCG/ACT inhaler Inhale 2 puffs  into the lungs 2 (two) times daily.      . Calcium Carbonate-Vitamin D (CALCIUM + D PO) Take 1 tablet by mouth daily.       . cephALEXin (KEFLEX) 500 MG capsule Take 1 capsule (500 mg total) by mouth 2 (two) times daily.  14 capsule  0  . cetirizine (ZYRTEC) 10 MG tablet Take 10 mg by mouth daily.      . Cholecalciferol (VITAMIN D) 2000 UNITS tablet Take 2,000 Units by mouth daily.      . Cyanocobalamin (VITAMIN B-12) 1000 MCG SUBL Place 1 tablet under the tongue daily.      Marland Kitchen gabapentin (NEURONTIN) 100 MG capsule 1-2 in am, 3 in pm      . meloxicam (MOBIC) 7.5 MG tablet Take 7.5 mg by mouth daily.      Marland Kitchen omeprazole (PRILOSEC) 20 MG capsule Take 20 mg by mouth daily.      . sertraline (ZOLOFT) 50 MG tablet Take 100 mg by mouth daily.       Marland Kitchen tiotropium (SPIRIVA) 18 MCG inhalation capsule Place 18 mcg into inhaler and  inhale daily.       No current facility-administered medications on file prior to visit.    Review of Systems:  As per HPI- otherwise negative.   Physical Examination: Filed Vitals:   08/17/13 1418  BP: 128/88  Pulse: 64  Temp: 97.9 F (36.6 C)  Resp: 18   Filed Vitals:   08/17/13 1418  Weight: 187 lb 9.6 oz (85.095 kg)   Body mass index is 36.64 kg/(m^2). Ideal Body Weight:    GEN: WDWN, NAD, Non-toxic, A & O x 3, obese, looks well HEENT: Atraumatic, Normocephalic. Neck supple. No masses, No LAD. Ears and Nose: No external deformity. CV: RRR, No M/G/R. No JVD. No thrill. No extra heart sounds. PULM: CTA B, no wheezes, crackles, rhonchi. No retractions. No resp. distress. No accessory muscle use. ABD: S, NT, ND, +BS. No rebound. No HSM. EXTR: No c/c/e NEURO Normal gait.  PSYCH: Normally interactive. Conversant. Not depressed or anxious appearing.  Calm demeanor.  Gu: normal appearing vulva and vaginal canal.  No masses, lesions or unusual discharge.  S/p hysterectomy Normal rectal tone   Results for orders placed in visit on 08/17/13  POCT URINALYSIS DIPSTICK      Result Value Ref Range   Color, UA yellow     Clarity, UA sl cloudy     Glucose, UA neg     Bilirubin, UA neg     Ketones, UA neg     Spec Grav, UA 1.025     Blood, UA neg     pH, UA 5.5     Protein, UA 100     Urobilinogen, UA 0.2     Nitrite, UA neg     Leukocytes, UA moderate (2+)    POCT UA - MICROSCOPIC ONLY      Result Value Ref Range   WBC, Ur, HPF, POC TNTC     RBC, urine, microscopic 1-3     Bacteria, U Microscopic trace     Mucus, UA neg     Epithelial cells, urine per micros 2-5     Crystals, Ur, HPF, POC neg     Casts, Ur, LPF, POC neg     Yeast, UA neg    POCT CBC      Result Value Ref Range   WBC 8.0  4.6 - 10.2 K/uL   Lymph, poc 2.0  0.6 - 3.4   POC LYMPH PERCENT 25.2  10 - 50 %L   MID (cbc) 0.4  0 - 0.9   POC MID % 5.2  0 - 12 %M   POC Granulocyte 5.6  2 - 6.9    Granulocyte percent 69.6  37 - 80 %G   RBC 4.23  4.04 - 5.48 M/uL   Hemoglobin 12.5  12.2 - 16.2 g/dL   HCT, POC 40.0  37.7 - 47.9 %   MCV 94.6  80 - 97 fL   MCH, POC 29.6  27 - 31.2 pg   MCHC 31.3 (*) 31.8 - 35.4 g/dL   RDW, POC 14.4     Platelet Count, POC 355  142 - 424 K/uL   MPV 8.9  0 - 99.8 fL  POCT WET PREP WITH KOH      Result Value Ref Range   Trichomonas, UA Negative     Clue Cells Wet Prep HPF POC neg     Epithelial Wet Prep HPF POC 0-2     Yeast Wet Prep HPF POC neg     Bacteria Wet Prep HPF POC 2+     RBC Wet Prep HPF POC neg     WBC Wet Prep HPF POC 0-1     KOH Prep POC Negative     Assessment and Plan: Dysuria - Plan: POCT urinalysis dipstick, POCT UA - Microscopic Only, Comprehensive metabolic panel, Urine culture, POCT CBC, POCT Wet Prep with KOH, fosfomycin (MONUROL) 3 G PACK  Abdominal pain in female patient - Plan: POCT Wet Prep with KOH  Persistent UTI sx.  Recent negative urine culture however today's urine is very suggestive of UTI.  Will treat with fosfomycin while we await her urine culture results.  She will let me know right away if any worsening   Signed Lamar Blinks, MD  4/30: received urine culture which is negative.  Called to check on her- she reports that she is feeling better but was not able to get the fosfomycin as it needed a prior auth.  Called pharmacy and this is not yet available.  Changed to keflex.  Will refer to urology as she continues to have UTI sx and UA suggestive of UTI with negative culture

## 2013-08-18 LAB — URINE CULTURE
Colony Count: NO GROWTH
Organism ID, Bacteria: NO GROWTH

## 2013-08-18 LAB — COMPREHENSIVE METABOLIC PANEL
ALK PHOS: 65 U/L (ref 39–117)
ALT: 13 U/L (ref 0–35)
AST: 16 U/L (ref 0–37)
Albumin: 4 g/dL (ref 3.5–5.2)
BUN: 17 mg/dL (ref 6–23)
CO2: 27 mEq/L (ref 19–32)
CREATININE: 0.74 mg/dL (ref 0.50–1.10)
Calcium: 8.9 mg/dL (ref 8.4–10.5)
Chloride: 100 mEq/L (ref 96–112)
GLUCOSE: 89 mg/dL (ref 70–99)
Potassium: 4.4 mEq/L (ref 3.5–5.3)
Sodium: 141 mEq/L (ref 135–145)
Total Bilirubin: 0.4 mg/dL (ref 0.2–1.2)
Total Protein: 6.2 g/dL (ref 6.0–8.3)

## 2013-08-20 MED ORDER — CEPHALEXIN 500 MG PO CAPS: 500.0000 mg | ORAL_CAPSULE | Freq: Two times a day (BID) | ORAL | Status: DC

## 2013-08-20 NOTE — Addendum Note (Signed)
Addended by: Lamar Blinks C on: 08/20/2013 05:10 PM   Modules accepted: Orders, Medications

## 2013-09-28 DIAGNOSIS — M064 Inflammatory polyarthropathy: Secondary | ICD-10-CM | POA: Diagnosis not present

## 2013-09-28 DIAGNOSIS — J449 Chronic obstructive pulmonary disease, unspecified: Secondary | ICD-10-CM | POA: Diagnosis not present

## 2013-09-28 DIAGNOSIS — F329 Major depressive disorder, single episode, unspecified: Secondary | ICD-10-CM | POA: Diagnosis not present

## 2013-09-28 DIAGNOSIS — M899 Disorder of bone, unspecified: Secondary | ICD-10-CM | POA: Diagnosis not present

## 2013-09-28 DIAGNOSIS — M949 Disorder of cartilage, unspecified: Secondary | ICD-10-CM | POA: Diagnosis not present

## 2013-09-28 DIAGNOSIS — Z23 Encounter for immunization: Secondary | ICD-10-CM | POA: Diagnosis not present

## 2013-09-28 DIAGNOSIS — M48061 Spinal stenosis, lumbar region without neurogenic claudication: Secondary | ICD-10-CM | POA: Diagnosis not present

## 2013-09-28 DIAGNOSIS — E782 Mixed hyperlipidemia: Secondary | ICD-10-CM | POA: Diagnosis not present

## 2013-09-28 DIAGNOSIS — K219 Gastro-esophageal reflux disease without esophagitis: Secondary | ICD-10-CM | POA: Diagnosis not present

## 2013-09-28 DIAGNOSIS — M839 Adult osteomalacia, unspecified: Secondary | ICD-10-CM | POA: Diagnosis not present

## 2013-10-07 ENCOUNTER — Ambulatory Visit: Payer: Medicare Other | Admitting: Rehabilitation

## 2013-11-30 DIAGNOSIS — M47812 Spondylosis without myelopathy or radiculopathy, cervical region: Secondary | ICD-10-CM | POA: Diagnosis not present

## 2013-11-30 DIAGNOSIS — M5412 Radiculopathy, cervical region: Secondary | ICD-10-CM | POA: Diagnosis not present

## 2013-11-30 DIAGNOSIS — R894 Abnormal immunological findings in specimens from other organs, systems and tissues: Secondary | ICD-10-CM | POA: Diagnosis not present

## 2013-11-30 DIAGNOSIS — M19049 Primary osteoarthritis, unspecified hand: Secondary | ICD-10-CM | POA: Diagnosis not present

## 2013-11-30 DIAGNOSIS — M1990 Unspecified osteoarthritis, unspecified site: Secondary | ICD-10-CM | POA: Diagnosis not present

## 2013-11-30 DIAGNOSIS — M2550 Pain in unspecified joint: Secondary | ICD-10-CM | POA: Diagnosis not present

## 2013-11-30 DIAGNOSIS — M5030 Other cervical disc degeneration, unspecified cervical region: Secondary | ICD-10-CM | POA: Diagnosis not present

## 2014-02-02 ENCOUNTER — Encounter: Payer: Self-pay | Admitting: *Deleted

## 2014-04-01 DIAGNOSIS — Z23 Encounter for immunization: Secondary | ICD-10-CM | POA: Diagnosis not present

## 2014-04-01 DIAGNOSIS — M25511 Pain in right shoulder: Secondary | ICD-10-CM | POA: Diagnosis not present

## 2014-04-01 DIAGNOSIS — Z791 Long term (current) use of non-steroidal anti-inflammatories (NSAID): Secondary | ICD-10-CM | POA: Diagnosis not present

## 2014-04-01 DIAGNOSIS — K219 Gastro-esophageal reflux disease without esophagitis: Secondary | ICD-10-CM | POA: Diagnosis not present

## 2014-04-01 DIAGNOSIS — M8580 Other specified disorders of bone density and structure, unspecified site: Secondary | ICD-10-CM | POA: Diagnosis not present

## 2014-04-01 DIAGNOSIS — R296 Repeated falls: Secondary | ICD-10-CM | POA: Diagnosis not present

## 2014-04-01 DIAGNOSIS — J449 Chronic obstructive pulmonary disease, unspecified: Secondary | ICD-10-CM | POA: Diagnosis not present

## 2014-04-01 DIAGNOSIS — F329 Major depressive disorder, single episode, unspecified: Secondary | ICD-10-CM | POA: Diagnosis not present

## 2014-04-01 DIAGNOSIS — J309 Allergic rhinitis, unspecified: Secondary | ICD-10-CM | POA: Diagnosis not present

## 2014-04-05 DIAGNOSIS — M2550 Pain in unspecified joint: Secondary | ICD-10-CM | POA: Diagnosis not present

## 2014-04-05 DIAGNOSIS — R768 Other specified abnormal immunological findings in serum: Secondary | ICD-10-CM | POA: Diagnosis not present

## 2014-04-05 DIAGNOSIS — M150 Primary generalized (osteo)arthritis: Secondary | ICD-10-CM | POA: Diagnosis not present

## 2014-04-05 DIAGNOSIS — M19049 Primary osteoarthritis, unspecified hand: Secondary | ICD-10-CM | POA: Diagnosis not present

## 2014-04-07 DIAGNOSIS — F329 Major depressive disorder, single episode, unspecified: Secondary | ICD-10-CM | POA: Diagnosis not present

## 2014-04-07 DIAGNOSIS — J449 Chronic obstructive pulmonary disease, unspecified: Secondary | ICD-10-CM | POA: Diagnosis not present

## 2014-07-02 DIAGNOSIS — R32 Unspecified urinary incontinence: Secondary | ICD-10-CM | POA: Diagnosis not present

## 2014-07-02 DIAGNOSIS — J449 Chronic obstructive pulmonary disease, unspecified: Secondary | ICD-10-CM | POA: Diagnosis not present

## 2014-07-02 DIAGNOSIS — R159 Full incontinence of feces: Secondary | ICD-10-CM | POA: Diagnosis not present

## 2014-07-15 DIAGNOSIS — R350 Frequency of micturition: Secondary | ICD-10-CM | POA: Diagnosis not present

## 2014-07-15 DIAGNOSIS — R310 Gross hematuria: Secondary | ICD-10-CM | POA: Diagnosis not present

## 2014-07-15 DIAGNOSIS — R351 Nocturia: Secondary | ICD-10-CM | POA: Diagnosis not present

## 2014-07-23 ENCOUNTER — Ambulatory Visit: Payer: Medicare Other

## 2014-07-27 ENCOUNTER — Ambulatory Visit: Payer: Self-pay

## 2014-07-27 ENCOUNTER — Other Ambulatory Visit: Payer: Self-pay

## 2014-07-27 NOTE — Patient Outreach (Signed)
[name] [facility]) Care Management  07/27/2014  [name] [birth year] [id number]   SUBJECTIVE:  Telephone call to patient for disease management follow up.  Patient states she is presently in [place] visiting her family and request call back from this RNCM next week.    PLAN:  RNCM will call patient at her request within a week.    Quinn Plowman RN,BSN,CCM [place] Coordinator [phone]

## 2014-08-03 ENCOUNTER — Other Ambulatory Visit: Payer: Medicare Other

## 2014-08-03 ENCOUNTER — Other Ambulatory Visit: Payer: Self-pay

## 2014-08-03 NOTE — Patient Outreach (Signed)
[facility]) Care Management  08/03/2014  ANUJA MANKA [birth year] [id number]   Telephone call to patient regarding disease management follow up.  Unable to reach patient. HIPAA compliant voice message left with call back phone number.   PLAN:  RNCM will attempt 3rd outreach call to patient within 1 week.

## 2014-08-05 ENCOUNTER — Ambulatory Visit: Payer: Medicare Other

## 2014-08-13 ENCOUNTER — Other Ambulatory Visit: Payer: Self-pay

## 2014-08-13 NOTE — Patient Outreach (Signed)
[facility]) Care Management  08/13/2014  [name] [birth year] [id number]  Telephone call to patient regarding disease management follow up.  Unable to reach patient.  HIPAA compliant voice message left with call back number.   PLAN:  RNCM will send patient outreach letter to attempt contact.  Quinn Plowman RN,BSN,CCM [place] Coordinator [phone]

## 2014-08-27 NOTE — Patient Outreach (Signed)
[facility]) Care Management  08/27/2014  TAKEYA MARQUIS May 23, 1935 [id number]   No response from patient after 3 telephone attempts and outreach letter.    PLAN:  RNCM will refer patient to [place] to close due to inability to establish contact. RNCM will notify patients primary MD of closure.   Quinn Plowman RN,BSN,CCM [place] Coordinator [phone]

## 2014-08-30 NOTE — Patient Outreach (Signed)
[facility]) Care Management  08/30/2014  JADAYA SOMMERFIELD 06/03/1935 [id number]   Received notification from Quinn Plowman, RN to close case due to unable to contact.  Ronnell Freshwater. [place] CM Assistant Phone: [phone] Fax: [phone]

## 2014-09-02 DIAGNOSIS — D494 Neoplasm of unspecified behavior of bladder: Secondary | ICD-10-CM | POA: Diagnosis not present

## 2014-09-02 DIAGNOSIS — R350 Frequency of micturition: Secondary | ICD-10-CM | POA: Diagnosis not present

## 2014-09-02 DIAGNOSIS — R310 Gross hematuria: Secondary | ICD-10-CM | POA: Diagnosis not present

## 2014-09-08 ENCOUNTER — Other Ambulatory Visit: Payer: Self-pay | Admitting: Urology

## 2014-09-16 ENCOUNTER — Encounter (HOSPITAL_BASED_OUTPATIENT_CLINIC_OR_DEPARTMENT_OTHER): Payer: Self-pay | Admitting: *Deleted

## 2014-09-17 ENCOUNTER — Encounter (HOSPITAL_BASED_OUTPATIENT_CLINIC_OR_DEPARTMENT_OTHER): Payer: Self-pay | Admitting: *Deleted

## 2014-09-17 NOTE — Progress Notes (Addendum)
NPO AFTER MN.  ARRIVE AT [phone].  NEEDS HG.  ASK MDA IF CXR NEEDED, LAST ONE DONE 2014.  WILL TAKE AM MEDS AND DO INHALERS AM DOS W/ SIPS OF WATER. PT USES OXYGEN W/ ACTIVITY AND AT NIGHT,  WILL BRING HER TANK.   REVIEWED CHART W/ DR DENENNY MDA,  OK TO PROCEED AND PT WILL BE ASSESS DOS.

## 2014-09-21 DIAGNOSIS — M5412 Radiculopathy, cervical region: Secondary | ICD-10-CM | POA: Diagnosis not present

## 2014-09-21 DIAGNOSIS — M154 Erosive (osteo)arthritis: Secondary | ICD-10-CM | POA: Diagnosis not present

## 2014-09-21 DIAGNOSIS — M150 Primary generalized (osteo)arthritis: Secondary | ICD-10-CM | POA: Diagnosis not present

## 2014-09-21 DIAGNOSIS — R768 Other specified abnormal immunological findings in serum: Secondary | ICD-10-CM | POA: Diagnosis not present

## 2014-09-22 NOTE — Anesthesia Preprocedure Evaluation (Addendum)
Anesthesia Evaluation  Patient identified by MRN, date of birth, ID band Patient awake    Reviewed: Allergy & Precautions, NPO status , Patient's Chart, lab work & pertinent test results  History of Anesthesia Complications (+) PONV  Airway Mallampati: II   Neck ROM: Full    Dental  (+) Teeth Intact, Dental Advisory Given,    Pulmonary sleep apnea , COPD COPD inhaler, former smoker (quit 2013 .100 pack years),  breath sounds clear to auscultation        Cardiovascular Rhythm:Regular  ECHO 2013 EF 70%   Neuro/Psych    GI/Hepatic Neg liver ROS, GERD-  Medicated,  Endo/Other    Renal/GU      Musculoskeletal   Abdominal (+)  Abdomen: soft.    Peds  Hematology   Anesthesia Other Findings   Reproductive/Obstetrics                           Anesthesia Physical Anesthesia Plan  ASA: III  Anesthesia Plan: General   Post-op Pain Management:    Induction: Intravenous  Airway Management Planned: Oral ETT and LMA  Additional Equipment:   Intra-op Plan:   Post-operative Plan:   Informed Consent: I have reviewed the patients History and Physical, chart, labs and discussed the procedure including the risks, benefits and alternatives for the proposed anesthesia with the patient or authorized representative who has indicated his/her understanding and acceptance.     Plan Discussed with:   Anesthesia Plan Comments:         Anesthesia Quick Evaluation

## 2014-09-22 NOTE — H&P (Signed)
History of Present Illness F/u - PCP Dr. Addison Lank.    1-frequency, urgency-March 2016-patient has had a couple of other episodes of pelvic pressure, frequency and urgency since April 2015. Urine cultures have been negative. UA and showed some pyuria and microscopic hematuria. Recent pelvic exam revealed atrophic vaginitis, cystocele and rectocele. She was started back on Premarin cream. She also has some fecal incontinence. She feels pelvic pressure. BUN was 13, creatinine 0.62. She's also notice some spotting when she wipes. Other surgery includes remote TAH/BSO. Neurogenic risk includes lumbar spine surgery. No real dysuria. Possible gross hematuria as above.     PVR 000 ml     2-gross hematuria - patient complains of blood when she wipes after voiding.      May 2016 interval hx  Patient returns and continued management of some irritative voiding symptoms and abnormal urinalysis. She cannot get abdominal CT because she said she was "claustrophobic". She will get a renal ultrasound later today. The Vesicare made no difference in her frequency. She has some dysuria, vaginal pain and blood when wiping.   Surgical History Problems  1. History of Appendectomy 2. History of Gallbladder Surgery 3. History of Hemorrhoidectomy 4. History of Hysterectomy  Current Meds 1. Albuterol AERS; AS NEEDED;  Therapy: (Recorded:24Mar2016) to Recorded 2. Aspir-81 81 MG Oral Tablet Delayed Release;  Therapy: (Recorded:24Mar2016) to Recorded 3. Calcium 1200 CHEW;  Therapy: (Recorded:24Mar2016) to Recorded 4. D 2000 TABS;  Therapy: (Recorded:24Mar2016) to Recorded 5. Lipitor 40 MG Oral Tablet;  Therapy: (Recorded:24Mar2016) to Recorded 6. Meloxicam TABS; once daily;  Therapy: (Recorded:24Mar2016) to Recorded 7. Sertraline HCl TABS; once daily;  Therapy: (Recorded:24Mar2016) to Recorded 8. Spiriva HandiHaler CAPS; 1 capsule a day;  Therapy: (Recorded:24Mar2016) to Recorded 9. Symbicort AERO; 2  puffs twice daily;  Therapy: (Recorded:24Mar2016) to Recorded 10. Vitamin B-12 CR 2000 MCG TBCR;   Therapy: (Recorded:24Mar2016) to Recorded  Allergies Medication  1. No Known Drug Allergies  Family History Problems  1. Family history of lung cancer (Z80.1) : Mother 2. Family history of malignant neoplasm (Z80.9) : Mother 3. Family history of ovarian cancer (Z80.41) : Father  Social History Problems  1. Caffeine use (F15.90)   1 cup of coffee a day 2. Former smoker [id number])   Quit 2013 3. Single  Vitals Vital Signs [Data Includes: Last 1 Day]  Recorded: [id number] 03:20PM  Blood Pressure: 135 / 76 Temperature: 98 F Heart Rate: 76  Physical Exam Abdomen: The abdomen is soft and nontender. No masses are palpated. No CVA tenderness. No hernias are palpable. No hepatosplenomegaly noted.  Genitourinary:  Chaperone Present: lisa.  Examination of the external genitalia shows normal female external genitalia and no lesions. The urethra is normal in appearance and not tender. There is no urethral mass. Vaginal exam demonstrates no abnormalities. The adnexa are palpably normal. The bladder is non tender and not distended. The anus is normal on inspection. The perineum is normal on inspection.    Results/Data Urine [Data Includes: Last 1 Day]   [id number]  COLOR YELLOW   APPEARANCE CLOUDY   SPECIFIC GRAVITY >1.030   pH 5.0   GLUCOSE NEG mg/dL  BILIRUBIN SMALL   KETONE NEG mg/dL  BLOOD LARGE   PROTEIN 100 mg/dL  UROBILINOGEN 0.2 mg/dL  NITRITE NEG   LEUKOCYTE ESTERASE SMALL   SQUAMOUS EPITHELIAL/HPF RARE   WBC 7-10 WBC/hpf  RBC 21-50 RBC/hpf  BACTERIA NONE SEEN   CRYSTALS NONE SEEN   CASTS NONE SEEN    Procedure  Procedure:  Cystoscopy  Chaperone Present: lisa.  Indication: Hematuria. Lower Urinary Tract Symptoms.  Informed Consent: Risks, benefits, and potential adverse events were discussed and informed consent was obtained from the patient.  Prep: The patient  was prepped with betadine.  Antibiotic prophylaxis: Ciprofloxacin.  Procedure Note:  Urethral meatus:. No abnormalities.  Anterior urethra: No abnormalities.  Bladder: Visulization was clear. The ureteral orifices were in the normal anatomic position bilaterally and had clear efflux of urine. Examination of the bladder demonstrated erythematous mucosa located on the left side of the bladder . The patient tolerated the procedure well.  Complications: None.    Assessment Assessed  1. Increased urinary frequency (R35.0) 2. Bladder neoplasm (D49.4)  Plan Bladder neoplasm  1. Follow-up Schedule Surgery Office  Follow-up  Status: Hold For - Appointment   Requested for: [id number] Gross hematuria  2. RENAL U/S COMPLETE; Status:Active; Requested for:12May2016;  Health Maintenance  3. UA With REFLEX; [Do Not Release]; Status:Complete;   Done: [id number] 03:05PM 4. UA With REFLEX; [Do Not Release]; Status:In Progress - Specimen/Data Collected;    Done: [id number]  renal US today - to OR for cysto, bbx, fulguration, bilateral RGP. Cathed UA sent for Cx today.   Discussion/Summary   Gross hematuria - left bladder wall thickening and inflamed. This needs cystoscopic evaluation in the operating room and biopsy. We discussed alternatives such as surveillance. We'll also set her up for ultrasound today and retrograde pyelograms in the OR.    Frequency, urgency - likely related to bladder process.     Signatures Electronically signed by : Festus Aloe, M.D.; Sep 02 2014  4:18PM EST   Addendum: Renal ultrasound was benign.  Malrotated right kidney.  Urine culture was negative.

## 2014-09-24 ENCOUNTER — Ambulatory Visit (HOSPITAL_BASED_OUTPATIENT_CLINIC_OR_DEPARTMENT_OTHER): Payer: Medicare Other | Admitting: Anesthesiology

## 2014-09-24 ENCOUNTER — Ambulatory Visit (HOSPITAL_BASED_OUTPATIENT_CLINIC_OR_DEPARTMENT_OTHER)
Admission: RE | Admit: 2014-09-24 | Discharge: 2014-09-24 | Disposition: A | Discharge status: Home / Self Care | Payer: Medicare Other | Source: Ambulatory Visit | Attending: Urology | Admitting: Urology

## 2014-09-24 ENCOUNTER — Encounter (HOSPITAL_BASED_OUTPATIENT_CLINIC_OR_DEPARTMENT_OTHER): Payer: Self-pay | Admitting: Certified Registered"

## 2014-09-24 ENCOUNTER — Encounter (HOSPITAL_BASED_OUTPATIENT_CLINIC_OR_DEPARTMENT_OTHER)
Admission: RE | Disposition: A | Discharge status: Home / Self Care | Payer: Self-pay | Source: Ambulatory Visit | Attending: Urology

## 2014-09-24 DIAGNOSIS — J449 Chronic obstructive pulmonary disease, unspecified: Secondary | ICD-10-CM | POA: Diagnosis not present

## 2014-09-24 DIAGNOSIS — N3289 Other specified disorders of bladder: Secondary | ICD-10-CM | POA: Diagnosis not present

## 2014-09-24 DIAGNOSIS — G4730 Sleep apnea, unspecified: Secondary | ICD-10-CM | POA: Insufficient documentation

## 2014-09-24 DIAGNOSIS — Z801 Family history of malignant neoplasm of trachea, bronchus and lung: Secondary | ICD-10-CM | POA: Diagnosis not present

## 2014-09-24 DIAGNOSIS — J9610 Chronic respiratory failure, unspecified whether with hypoxia or hypercapnia: Secondary | ICD-10-CM | POA: Diagnosis not present

## 2014-09-24 DIAGNOSIS — Z791 Long term (current) use of non-steroidal anti-inflammatories (NSAID): Secondary | ICD-10-CM | POA: Insufficient documentation

## 2014-09-24 DIAGNOSIS — Z87891 Personal history of nicotine dependence: Secondary | ICD-10-CM | POA: Diagnosis not present

## 2014-09-24 DIAGNOSIS — Z7982 Long term (current) use of aspirin: Secondary | ICD-10-CM | POA: Diagnosis not present

## 2014-09-24 DIAGNOSIS — Z7951 Long term (current) use of inhaled steroids: Secondary | ICD-10-CM | POA: Diagnosis not present

## 2014-09-24 DIAGNOSIS — N3090 Cystitis, unspecified without hematuria: Secondary | ICD-10-CM | POA: Diagnosis not present

## 2014-09-24 DIAGNOSIS — Q632 Ectopic kidney: Secondary | ICD-10-CM | POA: Insufficient documentation

## 2014-09-24 DIAGNOSIS — J441 Chronic obstructive pulmonary disease with (acute) exacerbation: Secondary | ICD-10-CM | POA: Diagnosis not present

## 2014-09-24 DIAGNOSIS — N3091 Cystitis, unspecified with hematuria: Secondary | ICD-10-CM | POA: Diagnosis not present

## 2014-09-24 DIAGNOSIS — R102 Pelvic and perineal pain: Secondary | ICD-10-CM | POA: Diagnosis not present

## 2014-09-24 DIAGNOSIS — K219 Gastro-esophageal reflux disease without esophagitis: Secondary | ICD-10-CM | POA: Diagnosis not present

## 2014-09-24 DIAGNOSIS — R310 Gross hematuria: Secondary | ICD-10-CM | POA: Diagnosis present

## 2014-09-24 HISTORY — DX: Atherosclerotic heart disease of native coronary artery without angina pectoris: I25.10

## 2014-09-24 HISTORY — DX: Cough: R05

## 2014-09-24 HISTORY — DX: Unspecified urinary incontinence: R32

## 2014-09-24 HISTORY — DX: Hereditary and idiopathic neuropathy, unspecified: G60.9

## 2014-09-24 HISTORY — DX: Hematuria, unspecified: R31.9

## 2014-09-24 HISTORY — DX: Pelvic and perineal pain: R10.2

## 2014-09-24 HISTORY — DX: Neoplasm of unspecified behavior of bladder: D49.4

## 2014-09-24 HISTORY — PX: CYSTOSCOPY W/ RETROGRADES: SHX1426

## 2014-09-24 HISTORY — PX: CYSTOSCOPY WITH BIOPSY: SHX5122

## 2014-09-24 HISTORY — DX: Obstructive sleep apnea (adult) (pediatric): G47.33

## 2014-09-24 HISTORY — DX: Dependence on supplemental oxygen: Z99.81

## 2014-09-24 HISTORY — DX: Personal history of other diseases of urinary system: Z87.448

## 2014-09-24 HISTORY — PX: FULGURATION OF BLADDER TUMOR: SHX6261

## 2014-09-24 HISTORY — DX: Chronic respiratory failure, unspecified whether with hypoxia or hypercapnia: J96.10

## 2014-09-24 HISTORY — DX: Other specified cough: R05.8

## 2014-09-24 HISTORY — DX: Chronic cough: R05.3

## 2014-09-24 LAB — POCT HEMOGLOBIN-HEMACUE: Hemoglobin: 12 g/dL (ref 12.0–15.0)

## 2014-09-24 SURGERY — CYSTOSCOPY, WITH BIOPSY
Anesthesia: General | Site: Ureter

## 2014-09-24 MED ORDER — ONDANSETRON HCL 4 MG/2ML IJ SOLN
INTRAMUSCULAR | Status: DC | PRN
  Administered 2014-09-24: 4 mg via INTRAVENOUS

## 2014-09-24 MED ORDER — PROPOFOL 10 MG/ML IV BOLUS
INTRAVENOUS | Status: DC | PRN
  Administered 2014-09-24: 100 mg via INTRAVENOUS

## 2014-09-24 MED ORDER — FENTANYL CITRATE (PF) 100 MCG/2ML IJ SOLN
25.0000 ug | INTRAMUSCULAR | Status: DC | PRN
  Filled 2014-09-24: qty 1

## 2014-09-24 MED ORDER — LACTATED RINGERS IV SOLN
INTRAVENOUS | Status: DC
  Administered 2014-09-24 (×2): via INTRAVENOUS
  Filled 2014-09-24: qty 1000

## 2014-09-24 MED ORDER — CEFAZOLIN SODIUM-DEXTROSE 2-3 GM-% IV SOLR
INTRAVENOUS | Status: AC
  Filled 2014-09-24: qty 50

## 2014-09-24 MED ORDER — LIDOCAINE HCL (CARDIAC) 20 MG/ML IV SOLN
INTRAVENOUS | Status: DC | PRN
  Administered 2014-09-24: 100 mg via INTRAVENOUS

## 2014-09-24 MED ORDER — EPHEDRINE SULFATE 50 MG/ML IJ SOLN
INTRAMUSCULAR | Status: DC | PRN
  Administered 2014-09-24: 10 mg via INTRAVENOUS

## 2014-09-24 MED ORDER — CEFAZOLIN SODIUM 1-5 GM-% IV SOLN
1.0000 g | INTRAVENOUS | Status: DC
  Filled 2014-09-24: qty 50

## 2014-09-24 MED ORDER — URELLE 81 MG PO TABS: 1.0000 | ORAL_TABLET | Freq: Four times a day (QID) | ORAL | Status: DC | PRN

## 2014-09-24 MED ORDER — FENTANYL CITRATE (PF) 100 MCG/2ML IJ SOLN
INTRAMUSCULAR | Status: DC | PRN
  Administered 2014-09-24: 50 ug via INTRAVENOUS

## 2014-09-24 MED ORDER — MEPERIDINE HCL 25 MG/ML IJ SOLN
6.2500 mg | INTRAMUSCULAR | Status: DC | PRN
  Filled 2014-09-24: qty 1

## 2014-09-24 MED ORDER — URELLE 81 MG PO TABS
1.0000 | ORAL_TABLET | Freq: Four times a day (QID) | ORAL | Status: DC
  Administered 2014-09-24: 81 mg via ORAL
  Filled 2014-09-24: qty 1

## 2014-09-24 MED ORDER — DEXAMETHASONE SODIUM PHOSPHATE 4 MG/ML IJ SOLN
INTRAMUSCULAR | Status: DC | PRN
  Administered 2014-09-24: 4 mg via INTRAVENOUS

## 2014-09-24 MED ORDER — STERILE WATER FOR IRRIGATION IR SOLN
Status: DC | PRN
  Administered 2014-09-24: 3000 mL

## 2014-09-24 MED ORDER — CEFAZOLIN SODIUM-DEXTROSE 2-3 GM-% IV SOLR
2.0000 g | INTRAVENOUS | Status: AC
  Administered 2014-09-24: 2 g via INTRAVENOUS
  Filled 2014-09-24: qty 50

## 2014-09-24 MED ORDER — URELLE 81 MG PO TABS
ORAL_TABLET | ORAL | Status: AC
  Filled 2014-09-24: qty 1

## 2014-09-24 MED ORDER — PROMETHAZINE HCL 25 MG/ML IJ SOLN
6.2500 mg | INTRAMUSCULAR | Status: DC | PRN
  Filled 2014-09-24: qty 1

## 2014-09-24 MED ORDER — FENTANYL CITRATE (PF) 100 MCG/2ML IJ SOLN
INTRAMUSCULAR | Status: AC
  Filled 2014-09-24: qty 6

## 2014-09-24 MED ORDER — NITROFURANTOIN MONOHYD MACRO 100 MG PO CAPS: 100.0000 mg | ORAL_CAPSULE | Freq: Two times a day (BID) | ORAL | Status: DC

## 2014-09-24 MED ORDER — BELLADONNA ALKALOIDS-OPIUM 16.2-60 MG RE SUPP
RECTAL | Status: AC
  Filled 2014-09-24: qty 1

## 2014-09-24 MED ORDER — IOHEXOL 350 MG/ML SOLN
INTRAVENOUS | Status: DC | PRN
  Administered 2014-09-24: 10 mL

## 2014-09-24 SURGICAL SUPPLY — 59 items
ADAPTER CATH URET PLST 4-6FR (CATHETERS) IMPLANT
ADPR CATH URET STRL DISP 4-6FR (CATHETERS)
BAG DRAIN URO-CYSTO SKYTR STRL (DRAIN) ×4 IMPLANT
BAG DRN ANRFLXCHMBR STRAP LEK (BAG)
BAG DRN UROCATH (DRAIN) ×2
BAG URINE DRAINAGE (UROLOGICAL SUPPLIES) ×4 IMPLANT
BAG URINE LEG 19OZ MD ST LTX (BAG) IMPLANT
BASKET LASER NITINOL 1.9FR (BASKET) IMPLANT
BASKET STNLS GEMINI 4WIRE 3FR (BASKET) IMPLANT
BASKET ZERO TIP NITINOL 2.4FR (BASKET) IMPLANT
BLADE SURG 15 STRL LF DISP TIS (BLADE) IMPLANT
BLADE SURG 15 STRL SS (BLADE)
BSKT STON RTRVL 120 1.9FR (BASKET)
BSKT STON RTRVL GEM 120X11 3FR (BASKET)
BSKT STON RTRVL ZERO TP 2.4FR (BASKET)
CANISTER SUCT LVC 12 LTR MEDI- (MISCELLANEOUS) ×2 IMPLANT
CATH FOLEY 3WAY 30CC 22F (CATHETERS) ×4 IMPLANT
CATH HEMA 3WAY 30CC 24FR COUDE (CATHETERS) IMPLANT
CATH HEMA 3WAY 30CC 24FR RND (CATHETERS) IMPLANT
CATH INTERMIT  6FR 70CM (CATHETERS) ×2 IMPLANT
CATH URET 5FR 28IN CONE TIP (BALLOONS)
CATH URET 5FR 28IN OPEN ENDED (CATHETERS) IMPLANT
CATH URET 5FR 70CM CONE TIP (BALLOONS) IMPLANT
CATH URET DUAL LUMEN 6-10FR 50 (CATHETERS) IMPLANT
CLOTH BEACON ORANGE TIMEOUT ST (SAFETY) ×4 IMPLANT
ELECT BUTTON BIOP 24F 90D PLAS (MISCELLANEOUS) IMPLANT
ELECT REM PT RETURN 9FT ADLT (ELECTROSURGICAL) ×4
ELECTRODE REM PT RTRN 9FT ADLT (ELECTROSURGICAL) ×2 IMPLANT
EVACUATOR MICROVAS BLADDER (UROLOGICAL SUPPLIES) IMPLANT
FIBER LASER FLEXIVA 365 (UROLOGICAL SUPPLIES) IMPLANT
FIBER LASER TRAC TIP (UROLOGICAL SUPPLIES) IMPLANT
GLOVE BIO SURGEON STRL SZ 6.5 (GLOVE) ×3 IMPLANT
GLOVE BIO SURGEON STRL SZ7.5 (GLOVE) ×4 IMPLANT
GLOVE BIO SURGEONS STRL SZ 6.5 (GLOVE) ×3
GLOVE BIOGEL PI IND STRL 6.5 (GLOVE) IMPLANT
GLOVE BIOGEL PI INDICATOR 6.5 (GLOVE) ×8
GLOVE INDICATOR 6.5 STRL GRN (GLOVE) ×2 IMPLANT
GOWN STRL REUS W/ TWL LRG LVL3 (GOWN DISPOSABLE) IMPLANT
GOWN STRL REUS W/ TWL XL LVL3 (GOWN DISPOSABLE) ×2 IMPLANT
GOWN STRL REUS W/TWL LRG LVL3 (GOWN DISPOSABLE) ×8
GOWN STRL REUS W/TWL XL LVL3 (GOWN DISPOSABLE) ×4
GUIDEWIRE 0.038 PTFE COATED (WIRE) IMPLANT
GUIDEWIRE ANG ZIPWIRE 038X150 (WIRE) IMPLANT
GUIDEWIRE STR DUAL SENSOR (WIRE) ×2 IMPLANT
HOLDER FOLEY CATH W/STRAP (MISCELLANEOUS) IMPLANT
KIT BALLIN UROMAX 15FX10 (LABEL) IMPLANT
KIT BALLN UROMAX 15FX4 (MISCELLANEOUS) IMPLANT
KIT BALLN UROMAX 26 75X4 (MISCELLANEOUS)
KIT SUPRAPUBIC CATH (MISCELLANEOUS) IMPLANT
NEEDLE HYPO 22GX1.5 SAFETY (NEEDLE) IMPLANT
NS IRRIG 500ML POUR BTL (IV SOLUTION) IMPLANT
PACK CYSTO (CUSTOM PROCEDURE TRAY) ×4 IMPLANT
PLUG CATH AND CAP STER (CATHETERS) IMPLANT
SET ASPIRATION TUBING (TUBING) IMPLANT
SET HIGH PRES BAL DIL (LABEL)
SUT ETHILON 3 0 PS 1 (SUTURE) IMPLANT
SYR 30ML LL (SYRINGE) ×4 IMPLANT
SYRINGE IRR TOOMEY STRL 70CC (SYRINGE) IMPLANT
WATER STERILE IRR 3000ML UROMA (IV SOLUTION) ×4 IMPLANT

## 2014-09-24 NOTE — Op Note (Signed)
Preoperative diagnosis: Bladder neoplasm, irritative voiding symptoms-frequency, urgency Postoperative diagnosis: Same  Procedure: Exam under anesthesia, cystoscopy with bladder biopsy and fulguration, Bilateral retrograde pyelogram  Surgeon: Junious Silk  Anesthesia: Manny  Type of anesthesia: Gen.  Indication for procedure: Patient is a 79 year old female with some frequency and urgency. On cystoscopy she had an erythematous and ulcerative appearing lesion along the left bladder/bladder neck. She was brought today for exam under anesthesia, cystoscopy bladder biopsy and fulguration. Renal ultrasound was benign. Urine culture negative.  Findings: Exam under anesthesia prior hysterectomy was noted with a blind ending vaginal vault without palpable lesion. Bladder and urethra were palpably normal.  Cystoscopy revealed a normal urethra. The trigone and ureteral orifices were in their normal orthotopic position with clear efflux. There were no stones or foreign bodies in the bladder. The bladder mucosa was normal except on the left bladder a few centimeters up from the left ureteral orifice along the left bladder wall bladder neck and a lesion from about 4:00 up to about 1:00.  Bladder capacity was diminished. She seemed to reach capacity around 250 mL.  Left retrograde pyelogram-this outlined a single ureter single collecting system unit without Filling defect stricture or dilation. There was brisk efflux.  Right retrograde pyelogram-this outlined a single ureter single collecting system unit without filling defect stricture or dilation. There was brisk efflux.   Description of procedure: After consent was obtained the patient was brought to the operating room. After adequate anesthesia she was placed in lithotomy position and prepped and draped in the usual sterile fashion. A timeout was performed to confirm the patient and procedure. An exam under anesthesia was performed. The cystoscope was  passed per urethra and carefully examined with a 30 and 70 lens. The left bladder lesion was biopsied 4 with a flexible biopsy forceps and these were sent together. The area was then  lightly fulgurated. this would have been enough to biopsy the mucosa and fulgurate the mucosa without deep fulguration.  I then flushed the bladder out with water 5 times. A 6 French open-ended catheter was cannulated into the left ureteral orifice and left retrograde injection of contrast was performed. The right ureteral orifice was cannulated with the 6 [place] open-ended catheter and right retrograde injection of contrast was performed.  The bladder was drained and the biopsy sites inspected at low-pressure. Hemostasis was excellent. Efflux was brisk from both ureteral orifices.  The patient was awakened and taken to the recovery room in stable condition.  Complications: None Blood loss: Minimal Drains: None  Specimens: Left bladder biopsy 4 sent together to pathology.

## 2014-09-24 NOTE — Interval H&P Note (Signed)
History and Physical Interval Note:  09/24/2014 9:48 AM  [name]  has presented today for surgery, with the diagnosis of BLADDER NEOPLASM, FREQUENCY, PELVIC PAIN  The various methods of treatment have been discussed with the patient and family. After consideration of risks, benefits and other options for treatment, the patient has consented to  Procedure(s) with comments: CYSTOSCOPY WITH BIOPSY (N/A) FULGURATION OF BLADDER TUMOR (N/A) CYSTOSCOPY WITH RETROGRADE PYELOGRAM (Bilateral) - C-ARM as a surgical intervention .  The patient's history has been reviewed, patient examined, no change in status, stable for surgery.  I have reviewed the patient's chart and labs.  Questions were answered to the patient's satisfaction.  U/S normal - reviewed. Urine Cx negative. Patient has been well. No dysuria or gross hematuria. She elects to proceed.    [name]

## 2014-09-24 NOTE — Anesthesia Procedure Notes (Signed)
Procedure Name: LMA Insertion Date/Time: 09/24/2014 10:19 AM Performed by: Denna Haggard D Pre-anesthesia Checklist: Patient identified, Emergency Drugs available, Suction available and Patient being monitored Patient Re-evaluated:Patient Re-evaluated prior to inductionOxygen Delivery Method: Circle System Utilized Preoxygenation: Pre-oxygenation with 100% oxygen Intubation Type: IV induction Ventilation: Mask ventilation without difficulty LMA: LMA inserted LMA Size: 4.0 Number of attempts: 1 Airway Equipment and Method: Bite block Placement Confirmation: positive ETCO2 Tube secured with: Tape Dental Injury: Teeth and Oropharynx as per pre-operative assessment

## 2014-09-24 NOTE — Transfer of Care (Signed)
Immediate Anesthesia Transfer of Care Note  Patient: [name]  Procedure(s) Performed: Procedure(s) (LRB): CYSTOSCOPY WITH BIOPSY (N/A) FULGURATION OF BLADDER TUMOR (N/A) CYSTOSCOPY WITH RETROGRADE PYELOGRAM (Bilateral)  Patient Location: PACU  Anesthesia Type: General  Level of Consciousness: awake, oriented, sedated and patient cooperative  Airway & Oxygen Therapy: Patient Spontanous Breathing and Patient connected to face mask oxygen  Post-op Assessment: Report given to PACU RN and Post -op Vital signs reviewed and stable  Post vital signs: Reviewed and stable  Complications: No apparent anesthesia complications

## 2014-09-24 NOTE — Anesthesia Postprocedure Evaluation (Signed)
  Anesthesia Post-op Note  Patient: [name]  Procedure(s) Performed: Procedure(s): CYSTOSCOPY WITH BIOPSY (N/A) FULGURATION OF BLADDER TUMOR (N/A) CYSTOSCOPY WITH RETROGRADE PYELOGRAM (Bilateral)  Patient Location: PACU  Anesthesia Type:General  Level of Consciousness: awake and alert   Airway and Oxygen Therapy: Patient Spontanous Breathing and Patient connected to nasal cannula oxygen  Post-op Pain: none  Post-op Assessment: Post-op Vital signs reviewed, Patient's Cardiovascular Status Stable, Respiratory Function Stable, Patent Airway and No signs of Nausea or vomiting  Post-op Vital Signs: Reviewed and stable  Last Vitals:  Filed Vitals:   09/24/14 1109  BP: 134/52  Pulse: 91  Temp: 36.5 C  Resp: 17    Complications: No apparent anesthesia complications

## 2014-09-24 NOTE — Discharge Instructions (Signed)
Cystoscopy, Care After °Refer to this sheet in the next few weeks. These instructions provide you with information on caring for yourself after your procedure. Your caregiver may also give you more specific instructions. Your treatment has been planned according to current medical practices, but problems sometimes occur. Call your caregiver if you have any problems or questions after your procedure. °HOME CARE INSTRUCTIONS  °Things you can do to ease any discomfort after your procedure include: °· Drinking enough water and fluids to keep your urine clear or pale yellow. °· Taking a warm bath to relieve any burning feelings. °SEEK IMMEDIATE MEDICAL CARE IF:  °· You have an increase in blood in your urine. °· You notice blood clots in your urine. °· You have difficulty passing urine. °· You have the chills. °· You have abdominal pain. °· You have a fever or persistent symptoms for more than 2-3 days. °· You have a fever and your symptoms suddenly get worse. °MAKE SURE YOU:  °· Understand these instructions. °· Will watch your condition. °· Will get help right away if you are not doing well or get worse. °Document Released: 10/27/2004 Document Revised: 12/10/2012 Document Reviewed: 10/01/2011 °Exit[employer]® Patient Information ©2015 [employer]. This information is not intended to replace advice given to you by your health care provider. Make sure you discuss any questions you have with your health care provider. ° °Post Anesthesia Home Care Instructions ° °Activity: °Get plenty of rest for the remainder of the day. A responsible adult should stay with you for 24 hours following the procedure.  °For the next 24 hours, DO NOT: °-Drive a car °-Operate machinery °-Drink alcoholic beverages °-Take any medication unless instructed by your physician °-Make any legal decisions or sign important papers. ° °Meals: °Start with liquid foods such as gelatin or soup. Progress to regular foods as tolerated. Avoid greasy, spicy, heavy  foods. If nausea and/or vomiting occur, drink only clear liquids until the nausea and/or vomiting subsides. Call your physician if vomiting continues. ° °Special Instructions/Symptoms: °Your throat may feel dry or sore from the anesthesia or the breathing tube placed in your throat during surgery. If this causes discomfort, gargle with warm salt water. The discomfort should disappear within 24 hours. ° °If you had a scopolamine patch placed behind your ear for the management of post- operative nausea and/or vomiting: ° °1. The medication in the patch is effective for 72 hours, after which it should be removed.  Wrap patch in a tissue and discard in the trash. Wash hands thoroughly with soap and water. °2. You may remove the patch earlier than 72 hours if you experience unpleasant side effects which may include dry mouth, dizziness or visual disturbances. °3. Avoid touching the patch. Wash your hands with soap and water after contact with the patch. °  ° °

## 2014-09-27 ENCOUNTER — Encounter (HOSPITAL_BASED_OUTPATIENT_CLINIC_OR_DEPARTMENT_OTHER): Payer: Self-pay | Admitting: Urology

## 2014-10-05 DIAGNOSIS — R350 Frequency of micturition: Secondary | ICD-10-CM | POA: Diagnosis not present

## 2014-10-05 DIAGNOSIS — N3010 Interstitial cystitis (chronic) without hematuria: Secondary | ICD-10-CM | POA: Diagnosis not present

## 2014-10-05 DIAGNOSIS — N390 Urinary tract infection, site not specified: Secondary | ICD-10-CM | POA: Diagnosis not present

## 2015-01-07 DIAGNOSIS — R296 Repeated falls: Secondary | ICD-10-CM | POA: Diagnosis not present

## 2015-01-07 DIAGNOSIS — R159 Full incontinence of feces: Secondary | ICD-10-CM | POA: Diagnosis not present

## 2015-01-07 DIAGNOSIS — E785 Hyperlipidemia, unspecified: Secondary | ICD-10-CM | POA: Diagnosis not present

## 2015-01-07 DIAGNOSIS — F324 Major depressive disorder, single episode, in partial remission: Secondary | ICD-10-CM | POA: Diagnosis not present

## 2015-01-07 DIAGNOSIS — Z23 Encounter for immunization: Secondary | ICD-10-CM | POA: Diagnosis not present

## 2015-01-07 DIAGNOSIS — M4806 Spinal stenosis, lumbar region: Secondary | ICD-10-CM | POA: Diagnosis not present

## 2015-01-07 DIAGNOSIS — M8580 Other specified disorders of bone density and structure, unspecified site: Secondary | ICD-10-CM | POA: Diagnosis not present

## 2015-01-07 DIAGNOSIS — E559 Vitamin D deficiency, unspecified: Secondary | ICD-10-CM | POA: Diagnosis not present

## 2015-01-07 DIAGNOSIS — Z791 Long term (current) use of non-steroidal anti-inflammatories (NSAID): Secondary | ICD-10-CM | POA: Diagnosis not present

## 2015-01-07 DIAGNOSIS — K219 Gastro-esophageal reflux disease without esophagitis: Secondary | ICD-10-CM | POA: Diagnosis not present

## 2015-01-07 DIAGNOSIS — M25511 Pain in right shoulder: Secondary | ICD-10-CM | POA: Diagnosis not present

## 2015-01-07 DIAGNOSIS — J449 Chronic obstructive pulmonary disease, unspecified: Secondary | ICD-10-CM | POA: Diagnosis not present

## 2015-01-24 DIAGNOSIS — M7541 Impingement syndrome of right shoulder: Secondary | ICD-10-CM | POA: Diagnosis not present

## 2015-02-21 DIAGNOSIS — M7541 Impingement syndrome of right shoulder: Secondary | ICD-10-CM | POA: Diagnosis not present

## 2015-03-02 DIAGNOSIS — M7541 Impingement syndrome of right shoulder: Secondary | ICD-10-CM | POA: Diagnosis not present

## 2015-03-16 DIAGNOSIS — M7541 Impingement syndrome of right shoulder: Secondary | ICD-10-CM | POA: Diagnosis not present

## 2015-03-23 DIAGNOSIS — M150 Primary generalized (osteo)arthritis: Secondary | ICD-10-CM | POA: Diagnosis not present

## 2015-03-23 DIAGNOSIS — M79643 Pain in unspecified hand: Secondary | ICD-10-CM | POA: Diagnosis not present

## 2015-03-23 DIAGNOSIS — M19049 Primary osteoarthritis, unspecified hand: Secondary | ICD-10-CM | POA: Diagnosis not present

## 2015-03-23 DIAGNOSIS — R768 Other specified abnormal immunological findings in serum: Secondary | ICD-10-CM | POA: Diagnosis not present

## 2015-03-24 MED ORDER — LACTATED RINGERS IV SOLN
INTRAVENOUS | Status: DC
  Administered 2015-03-25: 07:00:00 via INTRAVENOUS

## 2015-03-24 MED ORDER — CEFAZOLIN SODIUM-DEXTROSE 2-3 GM-% IV SOLR
2.0000 g | INTRAVENOUS | Status: AC
  Administered 2015-03-25: 2 g via INTRAVENOUS
  Filled 2015-03-24: qty 50

## 2015-03-24 MED ORDER — CHLORHEXIDINE GLUCONATE 4 % EX LIQD: 60.0000 mL | Freq: Once | CUTANEOUS | Status: DC

## 2015-03-25 ENCOUNTER — Encounter (HOSPITAL_COMMUNITY)
Admission: RE | Disposition: A | Discharge status: Home Health | Payer: Self-pay | Source: Ambulatory Visit | Attending: Orthopedic Surgery

## 2015-03-25 ENCOUNTER — Inpatient Hospital Stay (HOSPITAL_COMMUNITY)
Admission: RE | Admit: 2015-03-25 | Discharge: 2015-03-29 | DRG: 500 | Disposition: A | Discharge status: Home Health | Payer: Medicare Other | Source: Ambulatory Visit | Attending: Orthopedic Surgery | Admitting: Orthopedic Surgery

## 2015-03-25 ENCOUNTER — Ambulatory Visit (HOSPITAL_COMMUNITY): Payer: Medicare Other | Admitting: Certified Registered Nurse Anesthetist

## 2015-03-25 ENCOUNTER — Encounter (HOSPITAL_COMMUNITY): Payer: Self-pay | Admitting: Certified Registered Nurse Anesthetist

## 2015-03-25 DIAGNOSIS — J189 Pneumonia, unspecified organism: Secondary | ICD-10-CM | POA: Diagnosis not present

## 2015-03-25 DIAGNOSIS — M75111 Incomplete rotator cuff tear or rupture of right shoulder, not specified as traumatic: Secondary | ICD-10-CM | POA: Diagnosis present

## 2015-03-25 DIAGNOSIS — M7541 Impingement syndrome of right shoulder: Secondary | ICD-10-CM | POA: Diagnosis not present

## 2015-03-25 DIAGNOSIS — G629 Polyneuropathy, unspecified: Secondary | ICD-10-CM | POA: Diagnosis present

## 2015-03-25 DIAGNOSIS — Z9981 Dependence on supplemental oxygen: Secondary | ICD-10-CM

## 2015-03-25 DIAGNOSIS — J440 Chronic obstructive pulmonary disease with acute lower respiratory infection: Secondary | ICD-10-CM | POA: Diagnosis not present

## 2015-03-25 DIAGNOSIS — Z87891 Personal history of nicotine dependence: Secondary | ICD-10-CM

## 2015-03-25 DIAGNOSIS — M7540 Impingement syndrome of unspecified shoulder: Secondary | ICD-10-CM | POA: Diagnosis present

## 2015-03-25 DIAGNOSIS — M19011 Primary osteoarthritis, right shoulder: Secondary | ICD-10-CM | POA: Diagnosis present

## 2015-03-25 DIAGNOSIS — J45909 Unspecified asthma, uncomplicated: Secondary | ICD-10-CM | POA: Diagnosis present

## 2015-03-25 DIAGNOSIS — J9621 Acute and chronic respiratory failure with hypoxia: Secondary | ICD-10-CM | POA: Diagnosis not present

## 2015-03-25 DIAGNOSIS — S43431A Superior glenoid labrum lesion of right shoulder, initial encounter: Secondary | ICD-10-CM | POA: Diagnosis not present

## 2015-03-25 DIAGNOSIS — K219 Gastro-esophageal reflux disease without esophagitis: Secondary | ICD-10-CM | POA: Diagnosis present

## 2015-03-25 DIAGNOSIS — R5381 Other malaise: Secondary | ICD-10-CM | POA: Insufficient documentation

## 2015-03-25 DIAGNOSIS — M75101 Unspecified rotator cuff tear or rupture of right shoulder, not specified as traumatic: Secondary | ICD-10-CM | POA: Diagnosis not present

## 2015-03-25 DIAGNOSIS — G8918 Other acute postprocedural pain: Secondary | ICD-10-CM | POA: Diagnosis not present

## 2015-03-25 DIAGNOSIS — G8929 Other chronic pain: Secondary | ICD-10-CM | POA: Diagnosis present

## 2015-03-25 DIAGNOSIS — R1110 Vomiting, unspecified: Secondary | ICD-10-CM | POA: Insufficient documentation

## 2015-03-25 DIAGNOSIS — R1310 Dysphagia, unspecified: Secondary | ICD-10-CM

## 2015-03-25 DIAGNOSIS — Z79899 Other long term (current) drug therapy: Secondary | ICD-10-CM

## 2015-03-25 DIAGNOSIS — Z7982 Long term (current) use of aspirin: Secondary | ICD-10-CM

## 2015-03-25 DIAGNOSIS — R300 Dysuria: Secondary | ICD-10-CM | POA: Diagnosis present

## 2015-03-25 DIAGNOSIS — J9610 Chronic respiratory failure, unspecified whether with hypoxia or hypercapnia: Secondary | ICD-10-CM | POA: Diagnosis present

## 2015-03-25 DIAGNOSIS — F40240 Claustrophobia: Secondary | ICD-10-CM | POA: Diagnosis present

## 2015-03-25 DIAGNOSIS — Z7951 Long term (current) use of inhaled steroids: Secondary | ICD-10-CM

## 2015-03-25 DIAGNOSIS — Z888 Allergy status to other drugs, medicaments and biological substances status: Secondary | ICD-10-CM

## 2015-03-25 DIAGNOSIS — R0600 Dyspnea, unspecified: Secondary | ICD-10-CM

## 2015-03-25 DIAGNOSIS — G4733 Obstructive sleep apnea (adult) (pediatric): Secondary | ICD-10-CM | POA: Diagnosis present

## 2015-03-25 DIAGNOSIS — I2510 Atherosclerotic heart disease of native coronary artery without angina pectoris: Secondary | ICD-10-CM | POA: Diagnosis present

## 2015-03-25 HISTORY — DX: Impingement syndrome of unspecified shoulder: M75.40

## 2015-03-25 LAB — CBC WITH DIFFERENTIAL/PLATELET
BASOS PCT: 0 %
Basophils Absolute: 0 10*3/uL (ref 0.0–0.1)
EOS ABS: 0 10*3/uL (ref 0.0–0.7)
Eosinophils Relative: 1 %
HCT: 38.5 % (ref 36.0–46.0)
HEMOGLOBIN: 12.2 g/dL (ref 12.0–15.0)
Lymphocytes Relative: 25 %
Lymphs Abs: 2 10*3/uL (ref 0.7–4.0)
MCH: 29.5 pg (ref 26.0–34.0)
MCHC: 31.7 g/dL (ref 30.0–36.0)
MCV: 93 fL (ref 78.0–100.0)
MONOS PCT: 5 %
Monocytes Absolute: 0.4 10*3/uL (ref 0.1–1.0)
NEUTROS PCT: 69 %
Neutro Abs: 5.4 10*3/uL (ref 1.7–7.7)
Platelets: 334 10*3/uL (ref 150–400)
RBC: 4.14 MIL/uL (ref 3.87–5.11)
RDW: 13.1 % (ref 11.5–15.5)
WBC: 7.9 10*3/uL (ref 4.0–10.5)

## 2015-03-25 LAB — COMPREHENSIVE METABOLIC PANEL
ALBUMIN: 3.8 g/dL (ref 3.5–5.0)
ALK PHOS: 59 U/L (ref 38–126)
ALT: 13 U/L — ABNORMAL LOW (ref 14–54)
ANION GAP: 10 (ref 5–15)
AST: 18 U/L (ref 15–41)
BUN: 20 mg/dL (ref 6–20)
CO2: 29 mmol/L (ref 22–32)
Calcium: 9.1 mg/dL (ref 8.9–10.3)
Chloride: 99 mmol/L — ABNORMAL LOW (ref 101–111)
Creatinine, Ser: 0.66 mg/dL (ref 0.44–1.00)
GFR calc Af Amer: >60 mL/min (ref >60)
GFR calc non Af Amer: >60 mL/min (ref >60)
GLUCOSE: 103 mg/dL — AB (ref 65–99)
POTASSIUM: 4.1 mmol/L (ref 3.5–5.1)
SODIUM: 138 mmol/L (ref 135–145)
Total Bilirubin: 0.7 mg/dL (ref 0.3–1.2)
Total Protein: 6.6 g/dL (ref 6.5–8.1)

## 2015-03-25 LAB — PROTIME-INR
INR: 0.96 (ref 0.00–1.49)
Prothrombin Time: 13 seconds (ref 11.6–15.2)

## 2015-03-25 LAB — APTT: APTT: 27 s (ref 24–37)

## 2015-03-25 SURGERY — SHOULDER ARTHROSCOPY WITH SUBACROMIAL DECOMPRESSION AND DISTAL CLAVICLE EXCISION
Anesthesia: General | Site: Shoulder | Laterality: Right

## 2015-03-25 MED ORDER — ROCURONIUM BROMIDE 100 MG/10ML IV SOLN
INTRAVENOUS | Status: DC | PRN
  Administered 2015-03-25: 30 mg via INTRAVENOUS

## 2015-03-25 MED ORDER — ACETAMINOPHEN 325 MG PO TABS
650.0000 mg | ORAL_TABLET | Freq: Four times a day (QID) | ORAL | Status: DC | PRN
Start: 2015-03-25 — End: 2015-03-29
  Administered 2015-03-26: 650 mg via ORAL
  Filled 2015-03-25: qty 2

## 2015-03-25 MED ORDER — BUDESONIDE-FORMOTEROL FUMARATE 160-4.5 MCG/ACT IN AERO
2.0000 | INHALATION_SPRAY | Freq: Two times a day (BID) | RESPIRATORY_TRACT | Status: DC
  Administered 2015-03-25 – 2015-03-29 (×7): 2 via RESPIRATORY_TRACT
  Filled 2015-03-25: qty 6

## 2015-03-25 MED ORDER — FENTANYL CITRATE (PF) 100 MCG/2ML IJ SOLN: 25.0000 ug | INTRAMUSCULAR | Status: DC | PRN

## 2015-03-25 MED ORDER — LACTATED RINGERS IV SOLN
INTRAVENOUS | Status: DC
  Administered 2015-03-25 – 2015-03-26 (×2): via INTRAVENOUS

## 2015-03-25 MED ORDER — ROCURONIUM BROMIDE 50 MG/5ML IV SOLN
INTRAVENOUS | Status: AC
  Filled 2015-03-25: qty 1

## 2015-03-25 MED ORDER — ALBUTEROL SULFATE HFA 108 (90 BASE) MCG/ACT IN AERS: 2.0000 | INHALATION_SPRAY | Freq: Four times a day (QID) | RESPIRATORY_TRACT | Status: DC | PRN

## 2015-03-25 MED ORDER — GABAPENTIN 100 MG PO CAPS: 200.0000 mg | ORAL_CAPSULE | Freq: Two times a day (BID) | ORAL | Status: DC

## 2015-03-25 MED ORDER — SODIUM CHLORIDE 0.9 % IR SOLN
Status: DC | PRN
Start: 2015-03-25 — End: 2015-03-25
  Administered 2015-03-25: 1000 mL

## 2015-03-25 MED ORDER — ALBUTEROL SULFATE (2.5 MG/3ML) 0.083% IN NEBU
2.5000 mg | INHALATION_SOLUTION | RESPIRATORY_TRACT | Status: DC | PRN
  Administered 2015-03-25: 2.5 mg via RESPIRATORY_TRACT
  Filled 2015-03-25: qty 3

## 2015-03-25 MED ORDER — TIOTROPIUM BROMIDE MONOHYDRATE 18 MCG IN CAPS
18.0000 ug | ORAL_CAPSULE | Freq: Every morning | RESPIRATORY_TRACT | Status: DC
  Administered 2015-03-27 – 2015-03-29 (×3): 18 ug via RESPIRATORY_TRACT
  Filled 2015-03-25: qty 5

## 2015-03-25 MED ORDER — SALINE SPRAY 0.65 % NA SOLN
1.0000 | NASAL | Status: DC | PRN
  Administered 2015-03-28: 1 via NASAL
  Filled 2015-03-25 (×2): qty 44

## 2015-03-25 MED ORDER — LIDOCAINE HCL (CARDIAC) 20 MG/ML IV SOLN
INTRAVENOUS | Status: DC | PRN
  Administered 2015-03-25: 80 mg via INTRAVENOUS

## 2015-03-25 MED ORDER — SUGAMMADEX SODIUM 200 MG/2ML IV SOLN
INTRAVENOUS | Status: DC | PRN
  Administered 2015-03-25: 160 mg via INTRAVENOUS

## 2015-03-25 MED ORDER — EPHEDRINE SULFATE 50 MG/ML IJ SOLN
INTRAMUSCULAR | Status: DC | PRN
  Administered 2015-03-25 (×2): 5 mg via INTRAVENOUS
  Administered 2015-03-25: 10 mg via INTRAVENOUS

## 2015-03-25 MED ORDER — PANTOPRAZOLE SODIUM 40 MG PO TBEC
40.0000 mg | DELAYED_RELEASE_TABLET | Freq: Every day | ORAL | Status: DC
  Administered 2015-03-26 – 2015-03-29 (×4): 40 mg via ORAL
  Filled 2015-03-25 (×4): qty 1

## 2015-03-25 MED ORDER — METOCLOPRAMIDE HCL 5 MG/ML IJ SOLN: 5.0000 mg | Freq: Three times a day (TID) | INTRAMUSCULAR | Status: DC | PRN

## 2015-03-25 MED ORDER — ASPIRIN EC 81 MG PO TBEC
81.0000 mg | DELAYED_RELEASE_TABLET | Freq: Every day | ORAL | Status: DC
  Administered 2015-03-25 – 2015-03-29 (×5): 81 mg via ORAL
  Filled 2015-03-25 (×5): qty 1

## 2015-03-25 MED ORDER — SERTRALINE HCL 100 MG PO TABS
100.0000 mg | ORAL_TABLET | Freq: Every morning | ORAL | Status: DC
  Administered 2015-03-26 – 2015-03-29 (×4): 100 mg via ORAL
  Filled 2015-03-25 (×4): qty 1

## 2015-03-25 MED ORDER — MIDAZOLAM HCL 5 MG/5ML IJ SOLN
INTRAMUSCULAR | Status: DC | PRN
  Administered 2015-03-25 (×2): 1 mg via INTRAVENOUS

## 2015-03-25 MED ORDER — ATORVASTATIN CALCIUM 40 MG PO TABS
40.0000 mg | ORAL_TABLET | Freq: Every morning | ORAL | Status: DC
  Administered 2015-03-25 – 2015-03-29 (×5): 40 mg via ORAL
  Filled 2015-03-25 (×5): qty 1

## 2015-03-25 MED ORDER — LORATADINE 10 MG PO TABS
10.0000 mg | ORAL_TABLET | Freq: Every day | ORAL | Status: DC
  Administered 2015-03-25 – 2015-03-29 (×5): 10 mg via ORAL
  Filled 2015-03-25 (×6): qty 1

## 2015-03-25 MED ORDER — MIDAZOLAM HCL 2 MG/2ML IJ SOLN
INTRAMUSCULAR | Status: AC
  Filled 2015-03-25: qty 2

## 2015-03-25 MED ORDER — GABAPENTIN 100 MG PO CAPS
200.0000 mg | ORAL_CAPSULE | Freq: Every morning | ORAL | Status: DC
  Administered 2015-03-25 – 2015-03-29 (×5): 200 mg via ORAL
  Filled 2015-03-25 (×4): qty 2

## 2015-03-25 MED ORDER — OXYCODONE-ACETAMINOPHEN 5-325 MG PO TABS: 1.0000 | ORAL_TABLET | ORAL | Status: DC | PRN

## 2015-03-25 MED ORDER — SUCCINYLCHOLINE CHLORIDE 20 MG/ML IJ SOLN
INTRAMUSCULAR | Status: DC | PRN
  Administered 2015-03-25: 120 mg via INTRAVENOUS

## 2015-03-25 MED ORDER — PROPOFOL 10 MG/ML IV BOLUS
INTRAVENOUS | Status: AC
  Filled 2015-03-25: qty 20

## 2015-03-25 MED ORDER — LIDOCAINE HCL (CARDIAC) 20 MG/ML IV SOLN
INTRAVENOUS | Status: AC
  Filled 2015-03-25: qty 5

## 2015-03-25 MED ORDER — METOCLOPRAMIDE HCL 5 MG PO TABS: 5.0000 mg | ORAL_TABLET | Freq: Three times a day (TID) | ORAL | Status: DC | PRN

## 2015-03-25 MED ORDER — PROPOFOL 10 MG/ML IV BOLUS
INTRAVENOUS | Status: DC | PRN
  Administered 2015-03-25: 180 mg via INTRAVENOUS

## 2015-03-25 MED ORDER — ZOLPIDEM TARTRATE 5 MG PO TABS: 5.0000 mg | ORAL_TABLET | Freq: Every evening | ORAL | Status: DC | PRN

## 2015-03-25 MED ORDER — ACETAMINOPHEN 650 MG RE SUPP: 650.0000 mg | Freq: Four times a day (QID) | RECTAL | Status: DC | PRN

## 2015-03-25 MED ORDER — DIPHENHYDRAMINE HCL 12.5 MG/5ML PO ELIX: 12.5000 mg | ORAL_SOLUTION | ORAL | Status: DC | PRN

## 2015-03-25 MED ORDER — ONDANSETRON HCL 4 MG/2ML IJ SOLN: 4.0000 mg | Freq: Four times a day (QID) | INTRAMUSCULAR | Status: DC | PRN

## 2015-03-25 MED ORDER — FENTANYL CITRATE (PF) 250 MCG/5ML IJ SOLN
INTRAMUSCULAR | Status: AC
  Filled 2015-03-25: qty 5

## 2015-03-25 MED ORDER — DIAZEPAM 2 MG PO TABS: 2.0000 mg | ORAL_TABLET | Freq: Four times a day (QID) | ORAL | Status: DC | PRN

## 2015-03-25 MED ORDER — HYDROMORPHONE HCL 1 MG/ML IJ SOLN
1.0000 mg | INTRAMUSCULAR | Status: DC | PRN
  Administered 2015-03-26: 1 mg via INTRAVENOUS
  Filled 2015-03-25: qty 1

## 2015-03-25 MED ORDER — ONDANSETRON HCL 4 MG/2ML IJ SOLN
INTRAMUSCULAR | Status: AC
  Filled 2015-03-25: qty 2

## 2015-03-25 MED ORDER — ONDANSETRON HCL 4 MG PO TABS
4.0000 mg | ORAL_TABLET | Freq: Four times a day (QID) | ORAL | Status: DC | PRN
  Administered 2015-03-27: 4 mg via ORAL
  Filled 2015-03-25 (×2): qty 1

## 2015-03-25 MED ORDER — FENTANYL CITRATE (PF) 100 MCG/2ML IJ SOLN
INTRAMUSCULAR | Status: DC | PRN
  Administered 2015-03-25 (×2): 50 ug via INTRAVENOUS

## 2015-03-25 MED ORDER — OXYCODONE HCL 5 MG PO TABS: 5.0000 mg | ORAL_TABLET | ORAL | Status: DC | PRN

## 2015-03-25 MED ORDER — SUGAMMADEX SODIUM 200 MG/2ML IV SOLN
INTRAVENOUS | Status: AC
  Filled 2015-03-25: qty 2

## 2015-03-25 MED ORDER — GABAPENTIN 300 MG PO CAPS
300.0000 mg | ORAL_CAPSULE | Freq: Every evening | ORAL | Status: DC
  Administered 2015-03-25 – 2015-03-29 (×5): 300 mg via ORAL
  Filled 2015-03-25 (×5): qty 1

## 2015-03-25 SURGICAL SUPPLY — 62 items
BLADE CUTTER GATOR 3.5 (BLADE) ×3 IMPLANT
BLADE GREAT WHITE 4.2 (BLADE) ×2 IMPLANT
BLADE GREAT WHITE 4.2MM (BLADE) ×1
BLADE SURG 11 STRL SS (BLADE) ×3 IMPLANT
BOOTCOVER CLEANROOM LRG (PROTECTIVE WEAR) ×6 IMPLANT
BUR OVAL 4.0 (BURR) ×3 IMPLANT
CANISTER SUCT LVC 12 LTR MEDI- (MISCELLANEOUS) ×3 IMPLANT
CANNULA ACUFLEX KIT 5X76 (CANNULA) ×3 IMPLANT
CANNULA DRILOCK 5.0MMX75MM (CANNULA) ×1
CANNULA DRILOCK 5.0X75 (CANNULA) ×2 IMPLANT
CLOSURE STERI-STRIP 1/2X4 (GAUZE/BANDAGES/DRESSINGS) ×1
CLOSURE WOUND 1/2 X4 (GAUZE/BANDAGES/DRESSINGS) ×1
CLSR STERI-STRIP ANTIMIC 1/2X4 (GAUZE/BANDAGES/DRESSINGS) ×1 IMPLANT
CONNECTOR 5 IN 1 STRAIGHT STRL (MISCELLANEOUS) ×3 IMPLANT
DRAPE INCISE 23X17 IOBAN STRL (DRAPES)
DRAPE INCISE 23X17 STRL (DRAPES) IMPLANT
DRAPE INCISE IOBAN 23X17 STRL (DRAPES) IMPLANT
DRAPE INCISE IOBAN 66X45 STRL (DRAPES) ×3 IMPLANT
DRAPE ORTHO SPLIT 77X108 STRL (DRAPES) ×6
DRAPE STERI 35X30 U-POUCH (DRAPES) IMPLANT
DRAPE SURG 17X11 SM STRL (DRAPES) ×3 IMPLANT
DRAPE SURG ORHT 6 SPLT 77X108 (DRAPES) ×2 IMPLANT
DRAPE U-SHAPE 47X51 STRL (DRAPES) IMPLANT
DRSG PAD ABDOMINAL 8X10 ST (GAUZE/BANDAGES/DRESSINGS) ×4 IMPLANT
DURAPREP 26ML APPLICATOR (WOUND CARE) ×3 IMPLANT
GAUZE SPONGE 4X4 12PLY STRL (GAUZE/BANDAGES/DRESSINGS) ×3 IMPLANT
GLOVE BIO SURGEON STRL SZ7.5 (GLOVE) ×3 IMPLANT
GLOVE BIO SURGEON STRL SZ8 (GLOVE) ×3 IMPLANT
GLOVE EUDERMIC 7 POWDERFREE (GLOVE) ×3 IMPLANT
GLOVE SS BIOGEL STRL SZ 7.5 (GLOVE) ×1 IMPLANT
GLOVE SUPERSENSE BIOGEL SZ 7.5 (GLOVE) ×2
GOWN STRL REUS W/ TWL LRG LVL3 (GOWN DISPOSABLE) ×1 IMPLANT
GOWN STRL REUS W/ TWL XL LVL3 (GOWN DISPOSABLE) ×2 IMPLANT
GOWN STRL REUS W/TWL LRG LVL3 (GOWN DISPOSABLE) ×3
GOWN STRL REUS W/TWL XL LVL3 (GOWN DISPOSABLE) ×6
KIT BASIN OR (CUSTOM PROCEDURE TRAY) ×3 IMPLANT
KIT ROOM TURNOVER OR (KITS) ×3 IMPLANT
KIT SHOULDER TRACTION (DRAPES) ×3 IMPLANT
MANIFOLD NEPTUNE II (INSTRUMENTS) ×3 IMPLANT
NDL SPNL 18GX3.5 QUINCKE PK (NEEDLE) ×1 IMPLANT
NEEDLE SPNL 18GX3.5 QUINCKE PK (NEEDLE) ×3 IMPLANT
NS IRRIG 1000ML POUR BTL (IV SOLUTION) ×3 IMPLANT
PACK SHOULDER (CUSTOM PROCEDURE TRAY) ×3 IMPLANT
PAD ARMBOARD 7.5X6 YLW CONV (MISCELLANEOUS) ×6 IMPLANT
SET ARTHROSCOPY TUBING (MISCELLANEOUS) ×3
SET ARTHROSCOPY TUBING LN (MISCELLANEOUS) ×1 IMPLANT
SLING ARM FOAM STRAP LRG (SOFTGOODS) ×2 IMPLANT
SLING ARM LRG ADULT FOAM STRAP (SOFTGOODS) IMPLANT
SLING ARM MED ADULT FOAM STRAP (SOFTGOODS) ×3 IMPLANT
SPONGE LAP 4X18 X RAY DECT (DISPOSABLE) IMPLANT
STRIP CLOSURE SKIN 1/2X4 (GAUZE/BANDAGES/DRESSINGS) ×2 IMPLANT
SUT MNCRL AB 3-0 PS2 18 (SUTURE) ×3 IMPLANT
SUT PDS AB 0 CT 36 (SUTURE) ×2 IMPLANT
SYR 20CC LL (SYRINGE) IMPLANT
TAPE CLOTH SURG 4X10 WHT LF (GAUZE/BANDAGES/DRESSINGS) ×2 IMPLANT
TAPE PAPER 3X10 WHT MICROPORE (GAUZE/BANDAGES/DRESSINGS) ×3 IMPLANT
TOWEL OR 17X24 6PK STRL BLUE (TOWEL DISPOSABLE) ×3 IMPLANT
TOWEL OR 17X26 10 PK STRL BLUE (TOWEL DISPOSABLE) ×3 IMPLANT
TUBING BULK SUCTION (MISCELLANEOUS) ×2 IMPLANT
WAND SERFAS ENERGY SUPER 90 (SURGICAL WAND) ×2 IMPLANT
WAND SUCTION MAX 4MM 90S (SURGICAL WAND) ×3 IMPLANT
WATER STERILE IRR 1000ML POUR (IV SOLUTION) ×3 IMPLANT

## 2015-03-25 NOTE — Anesthesia Procedure Notes (Addendum)
Procedure Name: Intubation Date/Time: 03/25/2015 7:42 AM Performed by: Clearnce Sorrel Pre-anesthesia Checklist: Patient identified, Timeout performed, Emergency Drugs available, Suction available and Patient being monitored Patient Re-evaluated:Patient Re-evaluated prior to inductionOxygen Delivery Method: Circle system utilized Preoxygenation: Pre-oxygenation with 100% oxygen Intubation Type: IV induction Laryngoscope Size: Mac and 3 Grade View: Grade I Tube type: Oral Tube size: 7.0 mm Number of attempts: 1 Airway Equipment and Method: Stylet Placement Confirmation: ETT inserted through vocal cords under direct vision,  breath sounds checked- equal and bilateral and positive ETCO2 Secured at: 22 cm Tube secured with: Tape Dental Injury: Teeth and Oropharynx as per pre-operative assessment    Anesthesia Regional Block:  Interscalene brachial plexus block  Pre-Anesthetic Checklist: ,, timeout performed, Correct Patient, Correct Site, Correct Laterality, Correct Procedure, Correct Position, site marked, Risks and benefits discussed,  Surgical consent,  Pre-op evaluation,  At surgeon's request and post-op pain management  Laterality: Right  Prep: chloraprep       Needles:  Injection technique: Single-shot  Needle Type: Echogenic Stimulator Needle     Needle Length: 9cm 9 cm Needle Gauge: 21 and 21 G    Additional Needles:  Procedures: ultrasound guided (picture in chart) and nerve stimulator Interscalene brachial plexus block  Nerve Stimulator or Paresthesia:  Response: 0.4 mA,   Additional Responses:   Narrative:  Start time: 03/25/2015 7:10 AM End time: 03/25/2015 7:20 AM Injection made incrementally with aspirations every 5 mL.  Performed by: Personally  Anesthesiologist: Lillia Abed  Additional Notes: Monitors applied. Patient sedated. Sterile prep and drape,hand hygiene and sterile gloves were used. Relevant anatomy identified.Needle position confirmed.Local  anesthetic injected incrementally after negative aspiration. Local anesthetic spread visualized around nerve(s). Vascular puncture avoided. No complications. Image printed for medical record.The patient tolerated the procedure well.

## 2015-03-25 NOTE — H&P (Signed)
[name]    Chief Complaint: right shoulder impingement HPI: The patient is a 79 y.o. female with chronic right shoulder pain and impingement with symptoms refractory to conservative managment  Past Medical History  Diagnosis Date  . GERD (gastroesophageal reflux disease)   . PONV (postoperative nausea and vomiting)   . Arthritis   . COPD (chronic obstructive pulmonary disease) (HCC)     PULMOLOGIST-  DR [name]--  GOLD III W/  CHRONIC RESPIRATORY FAILURE--  O2 DEPENDENT  . Coronary arteriosclerosis   . Chronic respiratory failure ([place])   . Supplemental oxygen dependent     2L  via Nasal Canula --  activity and nighttime  . Urinary incontinence in female   . Hematuria   . Pelvic pain in female   . Bladder neoplasm   . Neuropathy, peripheral, idiopathic   . Mild obstructive sleep apnea     no cpap recommendation  . Cough productive of purulent sputum   . Chronic cough   . History of nephritis     as child dx w/ Bright's disease (glomerulonephritis)    Past Surgical History  Procedure Laterality Date  . Tonsillectomy  as child  . Posterior lumbar fusion  10-07-2007    bilateral laminectomy L4 and bilateral L4-5 diskectomy w/ fusion  . Hemorrhoid surgery  1973  . Cardiac catheterization  05-08-2007  dr Marlou Porch    minor coronary plaquing w/ no significant CAD/  20% mLAD,  10% mRCA,  perserved LV, ef 60-65%  . Transthoracic echocardiogram  07-07-2011    normal echo,  ef 65-70%  . Appendectomy  1971  . Total abdominal hysterectomy w/ bilateral salpingoophorectomy  1973  . Cholecystectomy  1988  . Esophagogastroduodenoscopy  last one 09-09-2008  . Cataract extraction w/ intraocular lens  implant, bilateral  2001 approx  . Cystoscopy with biopsy N/A 09/24/2014    Procedure: CYSTOSCOPY WITH BIOPSY;  Surgeon: Festus Aloe, MD;  Location: [facility];  Service: Urology;  Laterality: N/A;  . Fulguration of bladder tumor N/A 09/24/2014    Procedure: FULGURATION OF  BLADDER TUMOR;  Surgeon: Festus Aloe, MD;  Location[facility];  Service: Urology;  Laterality: N/A;  . Cystoscopy w/ retrogrades Bilateral 09/24/2014    Procedure: CYSTOSCOPY WITH RETROGRADE PYELOGRAM;  Surgeon: Festus Aloe, MD;  Location: [facility];  Service: Urology;  Laterality: Bilateral;    Family History  Problem Relation Age of Onset  . Asthma Maternal Grandmother     Social History:  reports that she quit smoking about 3 years ago. Her smoking use included Cigarettes. She has a 180 pack-year smoking history. She has never used smokeless tobacco. She reports that she does not drink alcohol or use illicit drugs.  Allergies:  Allergies  Allergen Reactions  . Kapidex [Dexlansoprazole] Other (See Comments)    Stomach pain, diarrhea  . Zegerid [Omeprazole] Other (See Comments)    Burning in chest    Medications Prior to Admission  Medication Sig Dispense Refill  . acetaminophen (TYLENOL) 650 MG CR tablet Take 650 mg by mouth 3 (three) times daily.    Marland Kitchen albuterol (PROVENTIL HFA;VENTOLIN HFA) 108 (90 BASE) MCG/ACT inhaler Inhale 2 puffs into the lungs every 6 (six) hours as needed. For shortness of breath.    Marland Kitchen albuterol (PROVENTIL) (2.5 MG/3ML) 0.083% nebulizer solution Take 2.5 mg by nebulization 2 (two) times daily as needed. For shortness of breath.    Marland Kitchen aspirin EC 81 MG tablet Take 81 mg  by mouth daily.    Marland Kitchen atorvastatin (LIPITOR) 40 MG tablet Take 40 mg by mouth every morning.     . budesonide-formoterol (SYMBICORT) 160-4.5 MCG/ACT inhaler Inhale 2 puffs into the lungs 2 (two) times daily.    . Calcium Carbonate-Vitamin D (CALCIUM + D PO) Take 1 tablet by mouth daily.     . cetirizine (ZYRTEC) 10 MG tablet Take 10 mg by mouth every morning.     . Cholecalciferol (VITAMIN D) 2000 UNITS tablet Take 2,000 Units by mouth daily.    . Cyanocobalamin (VITAMIN B-12) 1000 MCG SUBL Place 1 tablet under the tongue daily.    Marland Kitchen esomeprazole  (NEXIUM) 40 MG capsule Take 40 mg by mouth every morning.    . gabapentin (NEURONTIN) 100 MG capsule Take 200-300 mg by mouth 2 (two) times daily. 2 pills in am, 3 in pm    . sertraline (ZOLOFT) 100 MG tablet Take 100 mg by mouth every morning.    . tiotropium (SPIRIVA) 18 MCG inhalation capsule Place 18 mcg into inhaler and inhale every morning.     . vitamin A 10000 UNIT capsule Take 10,000 Units by mouth daily.    . vitamin E 400 UNIT capsule Take 400 Units by mouth daily.       Physical Exam: right shoulder with painful and restricted motion as noted at recent office vists  Vitals  Temp:  [97.5 F (36.4 C)] 97.5 F (36.4 C) (12/02 0640) Pulse Rate:  [64] 64 (12/02 0640) Resp:  [18] 18 (12/02 0640) BP: (135)/(103) 135/103 mmHg (12/02 0640) SpO2:  [94 %] 94 % (12/02 0640) Weight:  [83.008 kg (183 lb)] 83.008 kg (183 lb) (12/02 0640)  Assessment/Plan  Impression: right shoulder impingement  Plan of Action: Procedure(s): RIGHT SHOULDER ARTHROSCOPY WITH SUBACROMIAL DECOMPRESSION AND DISTAL CLAVICLE RESECTION  [name] 03/25/2015, 7:18 AM Contact # [phone]

## 2015-03-25 NOTE — Transfer of Care (Signed)
Immediate Anesthesia Transfer of Care Note  Patient: [name]  Procedure(s) Performed: Procedure(s): RIGHT SHOULDER ARTHROSCOPY WITH SUBACROMIAL DECOMPRESSION AND DISTAL CLAVICLE RESECTION (Right)  Patient Location: PACU  Anesthesia Type:General and Regional  Level of Consciousness: awake, alert  and oriented  Airway & Oxygen Therapy: Patient Spontanous Breathing and Patient connected to face mask oxygen  Post-op Assessment: Report given to RN and Post -op Vital signs reviewed and stable  Post vital signs: Reviewed and stable  Last Vitals:  Filed Vitals:   03/25/15 0640 03/25/15 0901  BP: 135/103 144/53  Pulse: 64 93  Temp: 36.4 C 35.9 C  Resp: 18 18    Complications: No apparent anesthesia complications

## 2015-03-25 NOTE — Anesthesia Preprocedure Evaluation (Signed)
Anesthesia Evaluation  Patient identified by MRN, date of birth, ID band Patient awake    History of Anesthesia Complications (+) PONV  Airway Mallampati: II  TM Distance: >3 FB Neck ROM: Full    Dental   Pulmonary sleep apnea , COPD, former smoker,    breath sounds clear to auscultation       Cardiovascular + CAD   Rhythm:Regular Rate:Normal     Neuro/Psych    GI/Hepatic Neg liver ROS, GERD  ,  Endo/Other  negative endocrine ROS  Renal/GU negative Renal ROS     Musculoskeletal  (+) Arthritis ,   Abdominal   Peds  Hematology   Anesthesia Other Findings   Reproductive/Obstetrics                             Anesthesia Physical Anesthesia Plan  ASA: III  Anesthesia Plan: General and Regional   Post-op Pain Management: GA combined w/ Regional for post-op pain   Induction: Intravenous  Airway Management Planned: Oral ETT  Additional Equipment:   Intra-op Plan:   Post-operative Plan: Extubation in OR  Informed Consent: I have reviewed the patients History and Physical, chart, labs and discussed the procedure including the risks, benefits and alternatives for the proposed anesthesia with the patient or authorized representative who has indicated his/her understanding and acceptance.   Dental advisory given  Plan Discussed with: CRNA and Anesthesiologist  Anesthesia Plan Comments:         Anesthesia Quick Evaluation

## 2015-03-25 NOTE — Discharge Instructions (Signed)
° °  Metta Clines. Supple, M.D., F.A.A.O.S. Orthopaedic Surgery Specializing in Arthroscopic and Reconstructive Surgery of the Shoulder and Knee [phone] [address] [place], [place] [address] - Fax [phone]   POST-OP SHOULDER ARTHROSCOPY INSTRUCTIONS  1. Call the office at [phone] to schedule your first post-op appointment 7-10 days from the date of your surgery.  2. Leave the steri-strips in place over your incisions when performing dressing changes and showering. You may remove your dressings and begin showering 72 hours from surgery. You can expect drainage that is clear to bloody in nature that occasionally will soak through your dressings. If this occurs go ahead and perform a dressing change. The drainage should lessen daily and when there is no drainage from your incisions feel free to go without a dressing.  3. Wear your sling for comfort. You may come out of your sling for ad lib activity and even decide not to use the sling at all. If you find you are more comfortable in your sling, make sure you come out of your sling at least 3-4 times a day to do the exercises that are included below.  4. Range of motion to your elbow, wrist, and hand are encouraged 3-5 times daily. Exercise to your hand and fingers helps to reduce swelling you may experience.  5. Utilize ice to the shoulder 3-4 times minimum a day and additionally if you are experiencing pain.  6. You may drive when safely off narcotics and muscle relaxants.  7. If you had a block pre-operatively to provide post-op pain relief you may want to go ahead and begin utilizing your pain meds as your arm begins to wake up. Blocks can sometimes last up to 16-18 hours. If you are still pain-free prior to going to bed you may want to strongly consider taking a pain medication to avoid being awakened in the night with the onset of pain. A muscle relaxant is also provided for you should you experience muscle spasms. It  is recommended that if you are experiencing pain that your pain medication alone is not controlling, add the muscle relaxant along with the pain medication which can give additional pain relief. The first one to two days is generally the most severe of your pain and then should gradually decrease. As your pain lessens it is recommended that you decrease your use of the pain medications to an "as needed basis" only and to always comply with the recommended dosages of the pain medications.  8. Pain medications can produce constipation along with their use. If you experience this, the use of an over the counter stool softener or laxative daily is recommended.   9. For additional questions or concerns, please do not hesitate to call the office. If after hours there is an answering service to forward your concerns to the physician on call.   POST-OP EXERCISES  The pendulum exercises should be performed while bending at the waist as far over as possible thereby letting gravity do the work for you.  Range of Motion Exercises: Pendulum (circular)  Repeat 20 times. Do 3 sessions per day.     Range of Motion Exercises: Pendulum (side-to-side)  Repeat 20 times. Do 3 sessions per day.    Range of Motion Exercises (self-stretching activities):  Slide arm up wall with palm toward you, moving closer to the wall. Hold for 5 seconds.  Repeat 10 times. Do 3 sessions per day.

## 2015-03-25 NOTE — Op Note (Signed)
03/25/2015  8:43 AM  PATIENT:   [name]  79 y.o. female  PRE-OPERATIVE DIAGNOSIS:  right shoulder impingement, ac joint OA  POST-OPERATIVE DIAGNOSIS:  Same with glenohumeral OA, labral tear, bicep tearing, partial articular RCT  PROCEDURE:  RSA, labral debridement, chondroplasty, bicep tenotomy, debridement partial articular RCT, SAD, DCR  SURGEON:  Kynnedy Carreno, Metta Clines M.D.  ASSISTANTS: Shuford pac   ANESTHESIA:   GET + ISB  EBL: min  SPECIMEN:  none  Drains: none   PATIENT DISPOSITION:  PACU - hemodynamically stable.    PLAN OF CARE: Admit for overnight observation  Dictation# [id number]   Contact # [phone]

## 2015-03-25 NOTE — Anesthesia Postprocedure Evaluation (Signed)
Anesthesia Post Note  Patient: [name]  Procedure(s) Performed: Procedure(s) (LRB): RIGHT SHOULDER ARTHROSCOPY WITH SUBACROMIAL DECOMPRESSION AND DISTAL CLAVICLE RESECTION (Right)  Patient location during evaluation: PACU Anesthesia Type: General Level of consciousness: awake Pain management: pain level controlled Vital Signs Assessment: post-procedure vital signs reviewed and stable Respiratory status: spontaneous breathing Cardiovascular status: blood pressure returned to baseline Anesthetic complications: no    Last Vitals:  Filed Vitals:   03/25/15 0640 03/25/15 0901  BP: 135/103 144/53  Pulse: 64 93  Temp: 36.4 C 35.9 C  Resp: 18 18    Last Pain:  Filed Vitals:   03/25/15 0920  PainSc: 0-No pain                 EDWARDS,Wynette [place]

## 2015-03-26 ENCOUNTER — Observation Stay (HOSPITAL_COMMUNITY): Payer: Medicare Other

## 2015-03-26 DIAGNOSIS — R1110 Vomiting, unspecified: Secondary | ICD-10-CM | POA: Insufficient documentation

## 2015-03-26 DIAGNOSIS — R0600 Dyspnea, unspecified: Secondary | ICD-10-CM

## 2015-03-26 DIAGNOSIS — R5381 Other malaise: Secondary | ICD-10-CM | POA: Diagnosis not present

## 2015-03-26 DIAGNOSIS — J90 Pleural effusion, not elsewhere classified: Secondary | ICD-10-CM | POA: Diagnosis not present

## 2015-03-26 DIAGNOSIS — R112 Nausea with vomiting, unspecified: Secondary | ICD-10-CM | POA: Diagnosis not present

## 2015-03-26 LAB — CBC
HCT: 38.5 % (ref 36.0–46.0)
Hemoglobin: 11.8 g/dL — ABNORMAL LOW (ref 12.0–15.0)
MCH: 29.4 pg (ref 26.0–34.0)
MCHC: 30.6 g/dL (ref 30.0–36.0)
MCV: 96 fL (ref 78.0–100.0)
Platelets: 287 K/uL (ref 150–400)
RBC: 4.01 MIL/uL (ref 3.87–5.11)
RDW: 13.2 % (ref 11.5–15.5)
WBC: 11.9 K/uL — ABNORMAL HIGH (ref 4.0–10.5)

## 2015-03-26 LAB — URINALYSIS, ROUTINE W REFLEX MICROSCOPIC
Bilirubin Urine: NEGATIVE
Glucose, UA: NEGATIVE mg/dL
Ketones, ur: NEGATIVE mg/dL
Nitrite: NEGATIVE
Protein, ur: NEGATIVE mg/dL
Specific Gravity, Urine: 1.011 (ref 1.005–1.030)
pH: 6 (ref 5.0–8.0)

## 2015-03-26 LAB — URINE MICROSCOPIC-ADD ON: Bacteria, UA: NONE SEEN

## 2015-03-26 LAB — BASIC METABOLIC PANEL
ANION GAP: 8 (ref 5–15)
BUN: 9 mg/dL (ref 6–20)
CHLORIDE: 94 mmol/L — AB (ref 101–111)
CO2: 33 mmol/L — ABNORMAL HIGH (ref 22–32)
Calcium: 8.3 mg/dL — ABNORMAL LOW (ref 8.9–10.3)
Creatinine, Ser: 0.78 mg/dL (ref 0.44–1.00)
GFR calc Af Amer: >60 mL/min (ref >60)
Glucose, Bld: 120 mg/dL — ABNORMAL HIGH (ref 65–99)
POTASSIUM: 3.9 mmol/L (ref 3.5–5.1)
Sodium: 135 mmol/L (ref 135–145)

## 2015-03-26 LAB — TROPONIN I
Troponin I: <0.03 ng/mL (ref <0.031)
Troponin I: <0.03 ng/mL (ref <0.031)

## 2015-03-26 MED ORDER — PIPERACILLIN-TAZOBACTAM 3.375 G IVPB
3.3750 g | Freq: Three times a day (TID) | INTRAVENOUS | Status: DC
  Administered 2015-03-26 – 2015-03-29 (×10): 3.375 g via INTRAVENOUS
  Filled 2015-03-26 (×13): qty 50

## 2015-03-26 MED ORDER — OXYCODONE-ACETAMINOPHEN 5-325 MG PO TABS
1.0000 | ORAL_TABLET | ORAL | Status: DC | PRN
  Administered 2015-03-26 – 2015-03-28 (×9): 2 via ORAL
  Administered 2015-03-29 (×2): 1 via ORAL
  Filled 2015-03-26: qty 1
  Filled 2015-03-26 (×7): qty 2
  Filled 2015-03-26: qty 1
  Filled 2015-03-26 (×2): qty 2

## 2015-03-26 MED ORDER — HYDROCODONE-ACETAMINOPHEN 5-325 MG PO TABS
1.0000 | ORAL_TABLET | Freq: Four times a day (QID) | ORAL | Status: DC | PRN
  Administered 2015-03-26: 1 via ORAL
  Filled 2015-03-26: qty 1

## 2015-03-26 NOTE — Progress Notes (Signed)
Wean off O2 to 3L, patient O2 sat 94 on 3L when rests. Patient desat whe she fall asleep times. Encourage her to use Si every 15 mins. Continue to monitor

## 2015-03-26 NOTE — Progress Notes (Signed)
ANTIBIOTIC CONSULT NOTE - INITIAL  Pharmacy Consult for Zosyn Indication: Aspiration PNA  Allergies  Allergen Reactions  . Kapidex [Dexlansoprazole] Other (See Comments)    Stomach pain, diarrhea  . Zegerid [Omeprazole] Other (See Comments)    Burning in chest    Patient Measurements: Height: 5' (152.4 cm) Weight: 183 lb (83.008 kg) IBW/kg (Calculated) : 45.5 Adjusted Body Weight:    Vital Signs: Temp: 98.4 F (36.9 C) (12/03 1454) Temp Source: Oral (12/03 1454) BP: 105/51 mmHg (12/03 1454) Pulse Rate: 75 (12/03 1454) Intake/Output from previous day: 12/02 0701 - 12/03 0700 In: 980 [P.O.:480; I.V.:500] Out: 100 [Urine:100] Intake/Output from this shift: Total I/O In: 120 [P.O.:120] Out: -   Labs:  Recent Labs  03/25/15 0642 03/26/15 1310  WBC 7.9 11.9*  HGB 12.2 11.8*  PLT 334 287  CREATININE 0.66 0.78   Estimated Creatinine Clearance: 56.2 mL/min (by C-G formula based on Cr of 0.78). No results for input(s): VANCOTROUGH, VANCOPEAK, VANCORANDOM, GENTTROUGH, GENTPEAK, GENTRANDOM, TOBRATROUGH, TOBRAPEAK, TOBRARND, AMIKACINPEAK, AMIKACINTROU, AMIKACIN in the last 72 hours.   Microbiology: No results found for this or any previous visit (from the past 720 hour(s)).  Medical History: Past Medical History  Diagnosis Date  . GERD (gastroesophageal reflux disease)   . PONV (postoperative nausea and vomiting)   . Arthritis   . COPD (chronic obstructive pulmonary disease) (HCC)     PULMOLOGIST-  DR [name]--  GOLD III W/  CHRONIC RESPIRATORY FAILURE--  O2 DEPENDENT  . Coronary arteriosclerosis   . Chronic respiratory failure ([place])   . Supplemental oxygen dependent     2L  via Nasal Canula --  activity and nighttime  . Urinary incontinence in female   . Hematuria   . Pelvic pain in female   . Bladder neoplasm   . Neuropathy, peripheral, idiopathic   . Mild obstructive sleep apnea     no cpap recommendation  . Cough productive of purulent sputum   . Chronic  cough   . History of nephritis     as child dx w/ Bright's disease (glomerulonephritis)     Assessment: 79 y/o F presents for R shoulder surgery 12/3. Post-op patient is nauseated, not tolerating diet, with SOB, and  malaise. She has a 2 wk h/o dysuria, COPD, headaches. CXR: suspicious for aspiration pneumonitis or pneumonia. WBC slightly elevated 11.9. CrCl 56. Plan to start Zosyn  Goal of Therapy:  Eradication of infection  Plan:  Zosyn 3.375g IV q8hr. Dose ok for CrCl down to 20. UA, [facility] will sign off. Please reconsult for further dosing assitance.   Alford Highland, [employer] 03/26/2015,3:14 PM

## 2015-03-26 NOTE — Op Note (Signed)
NAMEMARCA, LENSER              ACCOUNT NO.:  [id number]  MEDICAL RECORD NO.:  [id number]  LOCATION:  5N09C                        FACILITY:  [facility]  PHYSICIAN:  Metta Clines. Naomee Nowland, M.D.  DATE OF BIRTH:  [birth year]  DATE OF PROCEDURE:  03/25/2015 DATE OF DISCHARGE:                              OPERATIVE REPORT   PREOPERATIVE DIAGNOSES: 1. Chronic right shoulder pain with impingement syndrome. 2. Right shoulder symptomatic acromioclavicular joint arthropathy.  POSTOPERATIVE DIAGNOSES: 1. Chronic right shoulder pain with impingement syndrome. 2. Right shoulder symptomatic acromioclavicular joint arthropathy. 3. Right shoulder glenohumeral osteoarthritis. 4. Degenerative labral tearing. 5. Biceps tendon tearing. 6. Partial articular rotator cuff tear.  PROCEDURES: 1. Left shoulder examination under anesthesia. 2. Left shoulder glenohumeral joint diagnostic arthroscopy. 3. Labral debridement. 4. Chondroplasty of the humeral head and glenoid. 5. Biceps tendon tenotomy. 6. Debridement of partial articular rotator cuff tear. 7. Arthroscopic subacromial decompression and bursectomy. 8. Arthroscopic distal clavicle resection.  SURGEON:  Metta Clines. Janiesha Diehl, M.D.  Terrence DupontOlivia Mackie A. Shuford, PA-C.  ANESTHESIA:  LMA, general as well as an interscalene block.  ESTIMATED BLOOD LOSS:  Minimal.  DRAINS:  None.  HISTORY:  Ms. Meno is a 79 year old female who has had chronic right shoulder pain with impingement syndrome with symptoms that have been refractory to prolonged on multiple attempts at conservative management. Due to her ongoing pain and functional limitations and failure to respond to conservative management, she was brought to the operating room at this time for planned right shoulder arthroscopy as described below.  Preoperatively, I counseled Ms. Wicke regarding treatment options, potential risks versus benefits thereof.  Possible surgical complications were all  reviewed including bleeding, infection, neurovascular injury, persistent pain, loss of motion, anesthetic complication, and possible need for additional surgery.  She understands and accepts and agrees with our planned procedure.  PROCEDURE IN DETAIL:  After undergoing routine preop evaluation, the patient received prophylactic antibiotics and an interscalene block was established in the holding area by the Anesthesia Department.  Placed supine on the operating table, underwent smooth induction of a general endotracheal anesthesia.  Turned to the left lateral decubitus position on a beanbag and appropriately padded and protected.  Right shoulder examination under anesthesia revealed full motion.  No instability patterns were noted.  Right arm was then suspended at 70 degrees of abduction with 10 pounds of traction and the right shoulder girdle region was sterilely prepped and draped in standard fashion.  Time-out was called.  Posterior portal was established in the glenohumeral joint. The anterior portal was established under direct visualization.  The articular surface had showed diffuse chondromalacia, grade 2 and 3 throughout with a number of loose cartilage flaps and some peripheral osteophytic spurring at the margins of the glenoid.  A shaver was introduced and used to perform a chondroplasty of the loose cartilage flaps on the humeral head and the glenoid and also debrided the circumferential degenerative labral tear.  The biceps tendon showed marked attenuation throughout its intra-articular course with fraying and fissuring and given these findings, we performed the biceps tenotomy.  There was found to be a partial articular rotator cuff tear involving the distal supraspinatus at its posterior aspect and  we did pass the tag suture 0 PDS at this point after the area had been debrided.  Remaining inspection of the glenohumeral joint showed no additional pathologies.  Fluid and  instruments were then removed.  The arm was dropped down to 30 degrees of abduction.  Arthroscope was introduced in the subacromial space to the posterior portal and a direct lateral portal of the subacromial space.  Abundant dense bursal tissue and multiple inflammatory adhesions were found and these were all divided and excised with combination of straight shaver and the Stryker wand, but the wand was then used to remove the periosteum from the undersurface of the anterior half of the acromion and the subacromial decompression was performed with a bur creating a type 1 morphology. Portal was then established directly into the distal clavicle and distal clavicle resection was performed with bur.  Care was taken to confirm visualization of the entire circumference of the distal clavicle to ensure adequate removal of bone.  We did remove approximately 1 cm of bone.  We then completed the subacromial/subdeltoid bursectomy.  We inspected and probed the rotator cuff at the level of the tag suture and this showed that the bulk of the tissue in this area was intact with good quality of tissue and showed no full-thickness defect or severe attenuation, so did not feel the repair was indicated.  It would be my estimate that this was approximately 50% tear.  The tag suture was then removed.  We did obtain final hemostasis.  Bursectomy was completed. Fluid and instruments were removed.  The portals were closed with Monocryl and Steri-Strips with dry dressing taped at the right shoulder. Right arm was placed in a sling.  The patient was awakened, extubated, and taken to the recovery room in stable condition.  Jenetta Loges, PA-C was used as an Environmental consultant throughout this case, essential for help with positioning of the patient, positioning of the extremity, management of the arthroscopic equipment, tissue manipulation, wound closure and intraoperative decision making.     Metta Clines. Muntaha Vermette,  M.D.     KMS/MEDQ  D:  03/25/2015  T:  03/26/2015  Job:  [id number]

## 2015-03-26 NOTE — H&P (Signed)
[facility] Consultationl  [name] [id number] DOB: 1935-12-31 DOA: 03/25/2015  Referring physician: Orthopedics: Wylene Simmer, MD  PCP: [name], MD   Reason for consultation:  Post-op malaise.      ASSESSMENT AND PLAN  Malaise, post-op Day #1. Yesterday's labs are unremarkable.  -obtain u/a for 2 weeks of dysuria.  -CXR for episode of dyspnea yesterday and underlying COPD. Last echo in 2013 revealed EF of 65-70%, normal wall motion.  -cycle troponins given chest pressure yesterday.   COPD, on home 02 (almost continuously) -continue nebulizers -continue 02 -await cxr  Headaches. Two week history of headaches starting at back of left head and radiating upward towards forehead. Additionally patient having frequent "stabbling" pain in localized area in back of head (left base).  - woke up with a headache which Dilaudid helped.  -Etiology unclear, outpatient workup approprirate  shoulder impingement syndrome, s/p arthroscopy with subacromial decompression and distal clavicle resection yesterday. Plan was for discharge today but patient did not feel well. We've been asked to evaluate.  COPD, on home 02 at night and when physically active. Uses inhalers at home. Patient had an episode of shortness of breath yesterday, improved with nebulizers. -Obtain chest x-ray -Nebulizers -Continue incentive spirometry  Dysuria x 2 weeks. She is status post cystoscopy with bladder biopsy and fulguration in June. On cystoscopy patient hadn't ulcerative appearing lesion along the left bladder neck. Biopsy negative for malignancy, just markedly inflamed tissue.  -Obtain u/a    HPI: [name] is a 79 y.o. female  With a history of COPD / Asthma, and GERD. She underwent right should  Patient was for discharge today but at time of Ortho's am visit patient didn't feel very well. She doesn't have any specific details. Patient did have an episode of sob last pm, improved with  nebulizers. She also had a short episode of chest pressure but gets these episodes sometimes. This am she was nauseated and that got worse after first dose of pain meds. No actually vomiting. No constipation nor diarrhea. She endorses dysuria over the last couple of weeks. .   Labs:  Yesterdays CMET and CBC were normal.    EKG:  Yesterday   Sinus rhythm with Premature atrial complexes Otherwise normal ECG Confirmed by ROSS MD, PAULA ([id number]) on 03/25/2015 1:39:03 PM   Medications  aspirin EC tablet 81 mg (81 mg Oral Given 03/25/15 1226)  pantoprazole (PROTONIX) EC tablet 40 mg (not administered)  sertraline (ZOLOFT) tablet 100 mg (not administered)  albuterol (PROVENTIL) (2.5 MG/3ML) 0.083% nebulizer solution 2.5 mg (2.5 mg Nebulization Given 03/25/15 1842)  atorvastatin (LIPITOR) tablet 40 mg (40 mg Oral Given 03/25/15 1225)  budesonide-formoterol (SYMBICORT) 160-4.5 MCG/ACT inhaler 2 puff (2 puffs Inhalation Not Given 03/26/15 0905)  loratadine (CLARITIN) tablet 10 mg (10 mg Oral Given 03/25/15 1226)  tiotropium (SPIRIVA) inhalation capsule 18 mcg (18 mcg Inhalation Not Given 03/26/15 0906)  acetaminophen (TYLENOL) tablet 650 mg (650 mg Oral Given 03/26/15 0736)    Or  acetaminophen (TYLENOL) suppository 650 mg ( Rectal See Alternative 03/26/15 0736)  ondansetron (ZOFRAN) tablet 4 mg (not administered)    Or  ondansetron (ZOFRAN) injection 4 mg (not administered)  metoCLOPramide (REGLAN) tablet 5-10 mg (not administered)    Or  metoCLOPramide (REGLAN) injection 5-10 mg (not administered)  lactated ringers infusion ( Intravenous New Bag/Given 03/25/15 1124)  diphenhydrAMINE (BENADRYL) 12.5 MG/5ML elixir 12.5-25 mg (not administered)  gabapentin (NEURONTIN) capsule 200 mg (200 mg Oral Given 03/25/15 2243)  gabapentin (  NEURONTIN) capsule 300 mg (300 mg Oral Given 03/25/15 2000)  sodium chloride (OCEAN) 0.65 % nasal spray 1 spray (not administered)  HYDROcodone-acetaminophen (NORCO/VICODIN) 5-325  MG per tablet 1 tablet (not administered)  ceFAZolin (ANCEF) IVPB 2 g/50 mL premix (2 g Intravenous Given 03/25/15 0753)    Review of Systems  Constitutional: Positive for malaise/fatigue and diaphoresis.  Eyes: Negative.   Respiratory: Positive for shortness of breath.   Cardiovascular: Negative.   Gastrointestinal: Negative.   Genitourinary: Positive for dysuria.  Neurological: Negative.   Endo/Heme/Allergies: Negative.   Psychiatric/Behavioral: Negative.    Past Medical History  Diagnosis Date  . GERD (gastroesophageal reflux disease)   . PONV (postoperative nausea and vomiting)   . Arthritis   . COPD (chronic obstructive pulmonary disease) (HCC)     PULMOLOGIST-  DR [name]--  GOLD III W/  CHRONIC RESPIRATORY FAILURE--  O2 DEPENDENT  . Coronary arteriosclerosis   . Chronic respiratory failure ([place])   . Supplemental oxygen dependent     2L  via Nasal Canula --  activity and nighttime  . Urinary incontinence in female   . Hematuria   . Pelvic pain in female   . Bladder neoplasm   . Neuropathy, peripheral, idiopathic   . Mild obstructive sleep apnea     no cpap recommendation  . Cough productive of purulent sputum   . Chronic cough   . History of nephritis     as child dx w/ Bright's disease (glomerulonephritis)   Past Surgical History  Procedure Laterality Date  . Tonsillectomy  as child  . Posterior lumbar fusion  10-07-2007    bilateral laminectomy L4 and bilateral L4-5 diskectomy w/ fusion  . Hemorrhoid surgery  1973  . Cardiac catheterization  05-08-2007  dr Marlou Porch    minor coronary plaquing w/ no significant CAD/  20% mLAD,  10% mRCA,  perserved LV, ef 60-65%  . Transthoracic echocardiogram  07-07-2011    normal echo,  ef 65-70%  . Appendectomy  1971  . Total abdominal hysterectomy w/ bilateral salpingoophorectomy  1973  . Cholecystectomy  1988  . Esophagogastroduodenoscopy  last one 09-09-2008  . Cataract extraction w/ intraocular lens  implant, bilateral  2001  approx  . Cystoscopy with biopsy N/A 09/24/2014    Procedure: CYSTOSCOPY WITH BIOPSY;  Surgeon: Festus Aloe, MD;  Location: [facility];  Service: Urology;  Laterality: N/A;  . Fulguration of bladder tumor N/A 09/24/2014    Procedure: FULGURATION OF BLADDER TUMOR;  Surgeon: Festus Aloe, MD;  Location[facility];  Service: Urology;  Laterality: N/A;  . Cystoscopy w/ retrogrades Bilateral 09/24/2014    Procedure: CYSTOSCOPY WITH RETROGRADE PYELOGRAM;  Surgeon: Festus Aloe, MD;  Location: [facility];  Service: Urology;  Laterality: Bilateral;    SOCIAL HISTORY:  reports that she quit smoking about 3 years ago. Her smoking use included Cigarettes. She has a 180 pack-year smoking history. She has never used smokeless tobacco. She reports that she does not drink alcohol or use illicit drugs. Lives:  With son-in-law and granddaughter (daughter deceased)    Assistive devices:   None needed for ambulation.   Allergies  Allergen Reactions  . Kapidex [Dexlansoprazole] Other (See Comments)    Stomach pain, diarrhea  . Zegerid [Omeprazole] Other (See Comments)    Burning in chest    Family History  Problem Relation Age of Onset  . Asthma Maternal Grandmother     Prior to Admission medications   Medication Sig Start  Date End Date Taking? Authorizing Provider  acetaminophen (TYLENOL) 650 MG CR tablet Take 650 mg by mouth 3 (three) times daily.   Yes Historical Provider, MD  albuterol (PROVENTIL HFA;VENTOLIN HFA) 108 (90 BASE) MCG/ACT inhaler Inhale 2 puffs into the lungs every 6 (six) hours as needed. For shortness of breath.   Yes Historical Provider, MD  albuterol (PROVENTIL) (2.5 MG/3ML) 0.083% nebulizer solution Take 2.5 mg by nebulization 2 (two) times daily as needed. For shortness of breath.   Yes Historical Provider, MD  aspirin EC 81 MG tablet Take 81 mg by mouth daily.   Yes Historical Provider, MD  atorvastatin (LIPITOR) 40 MG tablet  Take 40 mg by mouth every morning.    Yes Historical Provider, MD  budesonide-formoterol (SYMBICORT) 160-4.5 MCG/ACT inhaler Inhale 2 puffs into the lungs 2 (two) times daily.   Yes Historical Provider, MD  Calcium Carbonate-Vitamin D (CALCIUM + D PO) Take 1 tablet by mouth daily.    Yes Historical Provider, MD  cetirizine (ZYRTEC) 10 MG tablet Take 10 mg by mouth every morning.    Yes Historical Provider, MD  Cholecalciferol (VITAMIN D) 2000 UNITS tablet Take 2,000 Units by mouth daily.   Yes Historical Provider, MD  Cyanocobalamin (VITAMIN B-12) 1000 MCG SUBL Place 1 tablet under the tongue daily.   Yes Historical Provider, MD  esomeprazole (NEXIUM) 40 MG capsule Take 40 mg by mouth every morning.   Yes Historical Provider, MD  gabapentin (NEURONTIN) 100 MG capsule Take 200-300 mg by mouth 2 (two) times daily. 2 pills in am, 3 in pm   Yes Historical Provider, MD  sertraline (ZOLOFT) 100 MG tablet Take 100 mg by mouth every morning.   Yes Historical Provider, MD  tiotropium (SPIRIVA) 18 MCG inhalation capsule Place 18 mcg into inhaler and inhale every morning.    Yes Historical Provider, MD  vitamin A 10000 UNIT capsule Take 10,000 Units by mouth daily.   Yes Historical Provider, MD  vitamin E 400 UNIT capsule Take 400 Units by mouth daily.   Yes Historical Provider, MD  diazepam (VALIUM) 2 MG tablet Take 1 tablet (2 mg total) by mouth every 6 (six) hours as needed for muscle spasms or sedation. 03/25/15   Olivia Mackie Shuford, PA-C  oxyCODONE-acetaminophen (PERCOCET) 5-325 MG tablet Take 1-2 tablets by mouth every 4 (four) hours as needed. 03/25/15   Tracy Shuford, PA-C   PHYSICAL EXAM: Filed Vitals:   03/25/15 2109 03/26/15 0533 03/26/15 0534 03/26/15 0832  BP: 140/69 137/55 139/57 136/53  Pulse:  90  89  Temp:  98.7 F (37.1 C)  97.7 F (36.5 C)  TempSrc:  Oral  Axillary  Resp:  18  15  Height:      Weight:      SpO2:  95%  95%    Wt Readings from Last 3 Encounters:  03/25/15 83.008 kg (183  lb)  09/24/14 81.647 kg (180 lb)  08/17/13 85.095 kg (187 lb 9.6 oz)    General:  Pleasant white female. Appears calm and comfortable Eyes: pinpoint pupils, round. Normal lids, irises & conjunctiva ENT: grossly normal hearing, lips & tongue Neck: no LAD, no masses Cardiovascular: RRR, no murmurs. No LE edema.  Respiratory: Respirations even and unlabored on [place]. Normal respiratory effort.decreased breath sounds and crackles at bilateral bases.  Abdomen: soft, non-distended, non-tender, active bowel sounds. No obvious masses.  Skin: no rash seen on limited exam Musculoskeletal: grossly normal tone LUE/BLE. Sling on right shoulder Psychiatric: grossly normal mood and  affect, speech fluent and appropriate Neurologic: grossly non-focal.         LABS ON ADMISSION:    Basic Metabolic Panel:  Recent Labs Lab 03/25/15 0642  NA 138  K 4.1  CL 99*  CO2 29  GLUCOSE 103*  BUN 20  CREATININE 0.66  CALCIUM 9.1   Liver Function Tests:  Recent Labs Lab 03/25/15 0642  AST 18  ALT 13*  ALKPHOS 59  BILITOT 0.7  PROT 6.6  ALBUMIN 3.8    CBC:  Recent Labs Lab 03/25/15 0642  WBC 7.9  NEUTROABS 5.4  HGB 12.2  HCT 38.5  MCV 93.0  PLT 334    CREATININE: 0.66 (03/25/15 0642) Estimated creatinine clearance - 56.2 mL/min   Time spent: 60 minutes Tye Savoy  NP [facility] Pager [phone]

## 2015-03-26 NOTE — Progress Notes (Signed)
Subjective: 1 Day Post-Op Procedure(s) (LRB): RIGHT SHOULDER ARTHROSCOPY WITH SUBACROMIAL DECOMPRESSION AND DISTAL CLAVICLE RESECTION (Right) Patient reports pain as mild.  Sister says pt has been nauseated.  She has only taken pain meds once since surgery (this morning.)  Pt c/o nausea and headache.  Objective: Vital signs in last 24 hours: Temp:  [97.7 F (36.5 C)-98.7 F (37.1 C)] 97.7 F (36.5 C) (12/03 0832) Pulse Rate:  [56-90] 89 (12/03 0832) Resp:  [15-26] 15 (12/03 0832) BP: (99-140)/(50-69) 136/53 mmHg (12/03 0832) SpO2:  [94 %-100 %] 95 % (12/03 0832)  Intake/Output from previous day: 12/02 0701 - 12/03 0700 In: 980 [P.O.:480; I.V.:500] Out: 100 [Urine:100] Intake/Output this shift:     Recent Labs  03/25/15 0642  HGB 12.2    Recent Labs  03/25/15 0642  WBC 7.9  RBC 4.14  HCT 38.5  PLT 334    Recent Labs  03/25/15 0642  NA 138  K 4.1  CL 99*  CO2 29  BUN 20  CREATININE 0.66  GLUCOSE 103*  CALCIUM 9.1    Recent Labs  03/25/15 0642  INR 0.96    PE:  elderly woman in nad.  Variably awake / alert and somnolent.  No focal neuro deficites.  NVI at r UE.  Assessment/Plan: 1 Day Post-Op Procedure(s) (LRB): RIGHT SHOULDER ARTHROSCOPY WITH SUBACROMIAL DECOMPRESSION AND DISTAL CLAVICLE RESECTION (Right) It's medically necessary to keep the patient in the hospital today.  SHe is not tolerating a diet yet and c/o significant nausea.  I'll ask the hospitalist to check on her today.  Wylene Simmer 03/26/2015, 9:13 AM

## 2015-03-26 NOTE — Progress Notes (Signed)
X ray shows a possible aspiration PNA-- will start zosyn for now, WBC count elevated  Eulogio Bear DO

## 2015-03-27 DIAGNOSIS — M7542 Impingement syndrome of left shoulder: Secondary | ICD-10-CM | POA: Diagnosis not present

## 2015-03-27 DIAGNOSIS — M75111 Incomplete rotator cuff tear or rupture of right shoulder, not specified as traumatic: Secondary | ICD-10-CM | POA: Diagnosis present

## 2015-03-27 DIAGNOSIS — J189 Pneumonia, unspecified organism: Secondary | ICD-10-CM

## 2015-03-27 DIAGNOSIS — J440 Chronic obstructive pulmonary disease with acute lower respiratory infection: Secondary | ICD-10-CM | POA: Diagnosis present

## 2015-03-27 DIAGNOSIS — R300 Dysuria: Secondary | ICD-10-CM | POA: Diagnosis present

## 2015-03-27 DIAGNOSIS — J9611 Chronic respiratory failure with hypoxia: Secondary | ICD-10-CM | POA: Diagnosis not present

## 2015-03-27 DIAGNOSIS — Z9981 Dependence on supplemental oxygen: Secondary | ICD-10-CM | POA: Diagnosis not present

## 2015-03-27 DIAGNOSIS — K219 Gastro-esophageal reflux disease without esophagitis: Secondary | ICD-10-CM | POA: Diagnosis not present

## 2015-03-27 DIAGNOSIS — M7540 Impingement syndrome of unspecified shoulder: Secondary | ICD-10-CM | POA: Diagnosis not present

## 2015-03-27 DIAGNOSIS — I2510 Atherosclerotic heart disease of native coronary artery without angina pectoris: Secondary | ICD-10-CM | POA: Diagnosis present

## 2015-03-27 DIAGNOSIS — M19011 Primary osteoarthritis, right shoulder: Secondary | ICD-10-CM | POA: Diagnosis present

## 2015-03-27 DIAGNOSIS — G8929 Other chronic pain: Secondary | ICD-10-CM | POA: Diagnosis present

## 2015-03-27 DIAGNOSIS — M7541 Impingement syndrome of right shoulder: Secondary | ICD-10-CM | POA: Diagnosis present

## 2015-03-27 DIAGNOSIS — J9601 Acute respiratory failure with hypoxia: Secondary | ICD-10-CM | POA: Diagnosis not present

## 2015-03-27 DIAGNOSIS — J9621 Acute and chronic respiratory failure with hypoxia: Secondary | ICD-10-CM | POA: Diagnosis not present

## 2015-03-27 DIAGNOSIS — J45909 Unspecified asthma, uncomplicated: Secondary | ICD-10-CM | POA: Diagnosis present

## 2015-03-27 DIAGNOSIS — F40240 Claustrophobia: Secondary | ICD-10-CM | POA: Diagnosis present

## 2015-03-27 DIAGNOSIS — G4733 Obstructive sleep apnea (adult) (pediatric): Secondary | ICD-10-CM | POA: Diagnosis present

## 2015-03-27 DIAGNOSIS — Z888 Allergy status to other drugs, medicaments and biological substances status: Secondary | ICD-10-CM | POA: Diagnosis not present

## 2015-03-27 DIAGNOSIS — G629 Polyneuropathy, unspecified: Secondary | ICD-10-CM | POA: Diagnosis present

## 2015-03-27 DIAGNOSIS — Z7951 Long term (current) use of inhaled steroids: Secondary | ICD-10-CM | POA: Diagnosis not present

## 2015-03-27 DIAGNOSIS — Z87891 Personal history of nicotine dependence: Secondary | ICD-10-CM | POA: Diagnosis not present

## 2015-03-27 DIAGNOSIS — Z79899 Other long term (current) drug therapy: Secondary | ICD-10-CM | POA: Diagnosis not present

## 2015-03-27 DIAGNOSIS — Z7982 Long term (current) use of aspirin: Secondary | ICD-10-CM | POA: Diagnosis not present

## 2015-03-27 LAB — TROPONIN I

## 2015-03-27 MED ORDER — LEVOFLOXACIN IN D5W 500 MG/100ML IV SOLN
500.0000 mg | INTRAVENOUS | Status: DC
  Filled 2015-03-27: qty 100

## 2015-03-27 MED ORDER — DEXTROSE 5 % IV SOLN
500.0000 mg | INTRAVENOUS | Status: DC
  Administered 2015-03-27 – 2015-03-28 (×2): 500 mg via INTRAVENOUS
  Filled 2015-03-27 (×4): qty 500

## 2015-03-27 NOTE — Evaluation (Signed)
Physical Therapy Evaluation Patient Details Name: [name] MRN: [id number] DOB: 1935/09/23 Today's Date: 03/27/2015   History of Present Illness  Patient is a 79 yo female admitted 03/25/15 with shoulder impingement syndrome.  Patient is s/p arthroscopic shoulder repair.    Clinical Impression  Patient presents with problems listed below.  Will benefit from acute PT to maximize functional mobility prior to discharge to sister's home.  Patient with decreased balance and mobility.  Recommend HHPT for balance training.    Follow Up Recommendations Home health PT;Supervision for mobility/OOB    Equipment Recommendations  Cane    Recommendations for Other Services       Precautions / Restrictions Precautions Precautions: Shoulder;Fall Required Braces or Orthoses: Sling Restrictions Weight Bearing Restrictions: Yes RUE Weight Bearing: Non weight bearing      Mobility  Bed Mobility Overal bed mobility: Needs Assistance Bed Mobility: Rolling;Sidelying to Sit;Sit to Sidelying Rolling: Min guard Sidelying to sit: Min assist     Sit to sidelying: Min assist General bed mobility comments: Verbal cues for technique to roll toward Lt side.  Assist to bring trunk to upright position.  Assist to bring LE's onto bed to return to sidelying.  Transfers Overall transfer level: Needs assistance Equipment used: Straight cane Transfers: Sit to/from Stand Sit to Stand: Min assist         General transfer comment: Verbal cues for technique.  Assist to steady patient during transfers to/from bed and 3-in-1.  Ambulation/Gait Ambulation/Gait assistance: Min assist Ambulation Distance (Feet): 64 Feet Assistive device: Straight cane Gait Pattern/deviations: Step-through pattern;Decreased stride length;Shuffle Gait velocity: decreased Gait velocity interpretation: Below normal speed for age/gender General Gait Details: Verbal cues for safe use of cane.  Patient with unsteady gait.     Stairs            Wheelchair Mobility    Modified Rankin (Stroke Patients Only)       Balance Overall balance assessment: Needs assistance         Standing balance support: Single extremity supported Standing balance-Leahy Scale: Poor                               Pertinent Vitals/Pain Pain Assessment: Faces Faces Pain Scale: Hurts even more Pain Location: Rt shoulder Pain Descriptors / Indicators: Aching;Sore;Throbbing Pain Intervention(s): Monitored during session;Repositioned    Home Living Family/patient expects to be discharged to:: Private residence Living Arrangements: Other relatives (Granddaughter and her dad) Available Help at Discharge: Family;Available 24 hours/day (Patient going to sister's home - 24* assist) Type of Home: House Home Access: Stairs to enter   Entrance Stairs-Number of Steps: 2 Home Layout: One level Home Equipment: Bedside commode Additional Comments: Patient is going to sister's home at d/c.  Sister is available 24/7 to assist patient.    Prior Function Level of Independence: Independent         Comments: Patient drives.     Hand Dominance   Dominant Hand: Right    Extremity/Trunk Assessment   Upper Extremity Assessment: RUE deficits/detail RUE Deficits / Details: In sling post-op         Lower Extremity Assessment: Generalized weakness         Communication   Communication: No difficulties  Cognition Arousal/Alertness: Awake/alert Behavior During Therapy: WFL for tasks assessed/performed Overall Cognitive Status: Within Functional Limits for tasks assessed  General Comments      Exercises        Assessment/Plan    PT Assessment Patient needs continued PT services  PT Diagnosis Difficulty walking;Generalized weakness;Acute pain   PT Problem List Decreased strength;Decreased range of motion;Decreased activity tolerance;Decreased balance;Decreased  mobility;Decreased knowledge of use of DME;Decreased knowledge of precautions;Pain  PT Treatment Interventions DME instruction;Gait training;Stair training;Functional mobility training;Therapeutic activities;Patient/family education   PT Goals (Current goals can be found in the Care Plan section) Acute Rehab PT Goals Patient Stated Goal: To decrease pain PT Goal Formulation: With patient Time For Goal Achievement: 04/03/15 Potential to Achieve Goals: Good    Frequency Min 3X/week   Barriers to discharge        Co-evaluation               End of Session Equipment Utilized During Treatment: Gait belt;Oxygen (Sling) Activity Tolerance: Patient limited by fatigue;Patient limited by pain Patient left: in bed;with call bell/phone within reach;with family/visitor present Nurse Communication: Mobility status    Functional Assessment Tool Used: Clinical judgement Functional Limitation: Mobility: Walking and moving around Mobility: Walking and Moving Around Current Status [id number]): At least 20 percent but less than 40 percent impaired, limited or restricted Mobility: Walking and Moving Around Goal Status [phone]): At least 1 percent but less than 20 percent impaired, limited or restricted    Time: [id number] PT Time Calculation (min) (ACUTE ONLY): 25 min   Charges:   PT Evaluation $Initial PT Evaluation Tier I: 1 Procedure PT Treatments $Gait Training: 8-22 mins   PT G Codes:   PT G-Codes **NOT FOR INPATIENT CLASS** Functional Assessment Tool Used: Clinical judgement Functional Limitation: Mobility: Walking and moving around Mobility: Walking and Moving Around Current Status [id number]): At least 20 percent but less than 40 percent impaired, limited or restricted Mobility: Walking and Moving Around Goal Status [phone]): At least 1 percent but less than 20 percent impaired, limited or restricted    Despina Pole 03/27/2015, 6:39 PM Carita Pian. Sanjuana Kava, [place] Pager [phone]

## 2015-03-27 NOTE — Progress Notes (Signed)
   Subjective: 2 Days Post-Op Procedure(s) (LRB): RIGHT SHOULDER ARTHROSCOPY WITH SUBACROMIAL DECOMPRESSION AND DISTAL CLAVICLE RESECTION (Right)  Pt c/o continued moderate to severe pain in the shoulder and nausea C/o mild pain in her legs and abd when she tries to get out of bed Patient reports pain as severe.  Objective:   VITALS:   Filed Vitals:   03/26/15 2058 03/27/15 0630  BP: 102/60 101/41  Pulse: 76 80  Temp: 98.4 F (36.9 C) 98.2 F (36.8 C)  Resp: 17 18    Right shoulder dressing and sling intact nv intact distally No rashes or edema  LABS  Recent Labs  03/25/15 0642 03/26/15 1310  HGB 12.2 11.8*  HCT 38.5 38.5  WBC 7.9 11.9*  PLT 334 287     Recent Labs  03/25/15 0642 03/26/15 1310  NA 138 135  K 4.1 3.9  BUN 20 9  CREATININE 0.66 0.78  GLUCOSE 103* 120*     Assessment/Plan: 2 Days Post-Op Procedure(s) (LRB): RIGHT SHOULDER ARTHROSCOPY WITH SUBACROMIAL DECOMPRESSION AND DISTAL CLAVICLE RESECTION (Right) Continue pain management and nausea treatment Will continue to monitor her progress Therapy as able    Merla Riches, MPAS, PA-C  03/27/2015, 7:35 AM

## 2015-03-27 NOTE — Progress Notes (Signed)
Patient and family concerned about the lack of physical/occupational therapy.  On call Merla Riches, PA contacted.  OT/PT ordered.  Family made aware.

## 2015-03-27 NOTE — Progress Notes (Signed)
Patient walked the length of the hall with the NT.  PT to see patient this afternoon.

## 2015-03-27 NOTE — Progress Notes (Signed)
ANTIBIOTIC CONSULT NOTE - INITIAL  Pharmacy Consult for Levaquin Indication: CAP  Allergies  Allergen Reactions  . Kapidex [Dexlansoprazole] Other (See Comments)    Stomach pain, diarrhea  . Zegerid [Omeprazole] Other (See Comments)    Burning in chest    Patient Measurements: Height: 5' (152.4 cm) Weight: 183 lb (83.008 kg) IBW/kg (Calculated) : 45.5 Adjusted Body Weight:    Vital Signs: Temp: 98.2 F (36.8 C) (12/04 0630) Temp Source: Oral (12/04 0630) BP: 101/41 mmHg (12/04 0630) Pulse Rate: 80 (12/04 0630) Intake/Output from previous day: 12/03 0701 - 12/04 0700 In: 360 [P.O.:360] Out: -  Intake/Output from this shift:    Labs:  Recent Labs  03/25/15 0642 03/26/15 1310  WBC 7.9 11.9*  HGB 12.2 11.8*  PLT 334 287  CREATININE 0.66 0.78   Estimated Creatinine Clearance: 56.2 mL/min (by C-G formula based on Cr of 0.78). No results for input(s): VANCOTROUGH, VANCOPEAK, VANCORANDOM, GENTTROUGH, GENTPEAK, GENTRANDOM, TOBRATROUGH, TOBRAPEAK, TOBRARND, AMIKACINPEAK, AMIKACINTROU, AMIKACIN in the last 72 hours.   Microbiology: No results found for this or any previous visit (from the past 720 hour(s)).  Medical History: Past Medical History  Diagnosis Date  . GERD (gastroesophageal reflux disease)   . PONV (postoperative nausea and vomiting)   . Arthritis   . COPD (chronic obstructive pulmonary disease) (HCC)     PULMOLOGIST-  DR [name]--  GOLD III W/  CHRONIC RESPIRATORY FAILURE--  O2 DEPENDENT  . Coronary arteriosclerosis   . Chronic respiratory failure ([place])   . Supplemental oxygen dependent     2L  via Nasal Canula --  activity and nighttime  . Urinary incontinence in female   . Hematuria   . Pelvic pain in female   . Bladder neoplasm   . Neuropathy, peripheral, idiopathic   . Mild obstructive sleep apnea     no cpap recommendation  . Cough productive of purulent sputum   . Chronic cough   . History of nephritis     as child dx w/ Bright's disease  (glomerulonephritis)     Assessment: 79 y/o F presents for R shoulder surgery 12/3. Post-op patient is nauseated, not tolerating diet, with SOB, and  malaise. She has a 2 wk h/o dysuria, COPD, headaches. CXR: suspicious for aspiration pneumonitis or pneumonia. WBC slightly elevated 11.9. CrCl 56.   ID: PNA (CAP/aspiration). Afebrile. WBC 11.9. Scr 56. No cultures. Adding Levaquin? Text-paged MD.  Lajean Silvius 12/3>> Levaquin 12/3>>  Goal of Therapy:  Eradication of infection  Plan:  Zosyn 3.375g IV q8hr--consider d/c Levaquin 500mg  IV q24h.   Alford Highland, [employer] 03/27/2015,12:32 PM

## 2015-03-27 NOTE — Progress Notes (Signed)
[facility] Progress Note   [name]  [id number]  DOB: [birth year]  DOA: 03/25/2015 PCP: [name], MD  Brief narrative: [name] is a 79 y.o. female with a history of COPD on 2 L of oxygen at home and gastritis or reflux disease. The patient was hospitalized for arthroscopy for right shoulder impingement. [facility] was consulted due to malaise on 2/3. She was noted to be hypoxic and was requiring 4 L of oxygen rather than her usual 2. She did not complain of a cough or shortness of breath but chest x-ray revealed bilateral lower lobe infiltrates consistent with pneumonia. She was started on antibiotics.   Subjective: She has more cough today and is spitting up green-colored mucus. No chest pain no shortness of breath at rest. She states that she once to get up and try to walk today. Pain in right shoulder is controlled.  Assessment/Plan: Principal Problem:   Pneumonia -Possible aspiration versus community-acquired-add Zithromax to Zosyn-obtain SLP eval  Active Problems:    Chronic respiratory failure  -On 2 L of oxygen chronically secondary to COPD     Shoulder impingement syndrome -Per surgery  Dysuria x 2 weeks - She is status post cystoscopy with bladder biopsy and fulguration in June. On cystoscopy, patient had an ulcerative appearing lesion along the left bladder neck. Biopsy negative for malignancy, just markedly inflamed tissue.  - Obtained u/a- small amount of white blood cells-no dysuria for doubtful this is a UTI   Code Status:     Code Status Orders        Start     Ordered   03/25/15 1055  Full code   Continuous     03/25/15 1054     Disposition Plan: Continue to wean O2  Antibiotics: Anti-infectives    Start     Dose/Rate Route Frequency Ordered Stop   03/27/15 1400  azithromycin (ZITHROMAX) 500 mg in dextrose 5 % 250 mL IVPB     500 mg 250 mL/hr over 60 Minutes Intravenous Every 24 hours 03/27/15 1323     03/27/15 1200  levofloxacin (LEVAQUIN) IVPB 500 mg  Status:  Discontinued     500 mg 100 mL/hr over 60 Minutes Intravenous Every 24 hours 03/27/15 1111 03/27/15 1322   03/26/15 1515  piperacillin-tazobactam (ZOSYN) IVPB 3.375 g     3.375 g 12.5 mL/hr over 240 Minutes Intravenous 3 times per day 03/26/15 1508     03/25/15 0600  ceFAZolin (ANCEF) IVPB 2 g/50 mL premix     2 g 100 mL/hr over 30 Minutes Intravenous On call to O.R. 03/24/15 1332 03/25/15 0753      Objective: Filed Weights   03/25/15 0640  Weight: 83.008 kg (183 lb)    Intake/Output Summary (Last 24 hours) at 03/27/15 1425 Last data filed at 03/27/15 1300  Gross per 24 hour  Intake    720 ml  Output      0 ml  Net    720 ml     Vitals Filed Vitals:   03/26/15 1600 03/26/15 2058 03/27/15 0630 03/27/15 1300  BP:  102/60 101/41 99/64  Pulse:  76 80 77  Temp:  98.4 F (36.9 C) 98.2 F (36.8 C) 98.3 F (36.8 C)  TempSrc:  Oral Oral Oral  Resp:  17 18 17   Height:      Weight:      SpO2: 94% 95% 95% 92%    Exam:  General:  Pt is alert, not in acute  distress  HEENT: No icterus, No thrush, oral mucosa moist  Cardiovascular: regular rate and rhythm, S1/S2 No murmur  Respiratory: clear to auscultation bilaterally - on 3 L of oxygen-pulse ox 92%  Abdomen: Soft, +Bowel sounds, non tender, non distended, no guarding  MSK: No LE edema, cyanosis or clubbing  Data Reviewed: Basic Metabolic Panel:  Recent Labs Lab 03/25/15 0642 03/26/15 1310  NA 138 135  K 4.1 3.9  CL 99* 94*  CO2 29 33*  GLUCOSE 103* 120*  BUN 20 9  CREATININE 0.66 0.78  CALCIUM 9.1 8.3*   Liver Function Tests:  Recent Labs Lab 03/25/15 0642  AST 18  ALT 13*  ALKPHOS 59  BILITOT 0.7  PROT 6.6  ALBUMIN 3.8   No results for input(s): LIPASE, AMYLASE in the last 168 hours. No results for input(s): AMMONIA in the last 168 hours. CBC:  Recent Labs Lab 03/25/15 0642 03/26/15 1310  WBC 7.9 11.9*  NEUTROABS 5.4  --   HGB  12.2 11.8*  HCT 38.5 38.5  MCV 93.0 96.0  PLT 334 287   Cardiac Enzymes:  Recent Labs Lab 03/26/15 1310 03/26/15 1900 03/27/15  TROPONINI <0.03 <0.03 <0.03   BNP (last 3 results) No results for input(s): BNP in the last 8760 hours.  ProBNP (last 3 results) No results for input(s): PROBNP in the last 8760 hours.  CBG: No results for input(s): GLUCAP in the last 168 hours.  No results found for this or any previous visit (from the past 240 hour(s)).   Studies: Dg Chest Port 1 View  03/26/2015  CLINICAL DATA:  Dyspnea.  COPD. EXAM: PORTABLE CHEST 1 VIEW COMPARISON:  08/15/2012 chest radiograph. FINDINGS: Stable cardiomediastinal silhouette with normal heart size. No pneumothorax. There is a new small left pleural effusion. No pulmonary edema. Mild patchy opacity at both lung bases. IMPRESSION: 1. New small left pleural effusion. 2. Mild patchy opacity at both lung bases, suspicious for aspiration pneumonitis or pneumonia. Electronically Signed   By: Ilona Sorrel M.D.   On: 03/26/2015 12:19    Scheduled Meds:  Scheduled Meds: . aspirin EC  81 mg Oral Daily  . atorvastatin  40 mg Oral q morning - 10a  . azithromycin  500 mg Intravenous Q24H  . budesonide-formoterol  2 puff Inhalation BID  . gabapentin  200 mg Oral q morning - 10a  . gabapentin  300 mg Oral QPM  . loratadine  10 mg Oral Daily  . pantoprazole  40 mg Oral Daily  . piperacillin-tazobactam (ZOSYN)  IV  3.375 g Intravenous 3 times per day  . sertraline  100 mg Oral q morning - 10a  . tiotropium  18 mcg Inhalation q morning - 10a   Continuous Infusions: . lactated ringers 50 mL/hr at 03/26/15 2149    Time spent on care of this patient: 28 min   [place], MD 03/27/2015, 2:25 PM    [facility] Office  [phone] Pager - Text Page per [email or web address] If 7PM-7AM, please contact night-coverage [email or web address]

## 2015-03-28 ENCOUNTER — Inpatient Hospital Stay (HOSPITAL_COMMUNITY): Payer: Medicare Other

## 2015-03-28 DIAGNOSIS — K219 Gastro-esophageal reflux disease without esophagitis: Secondary | ICD-10-CM

## 2015-03-28 DIAGNOSIS — J9611 Chronic respiratory failure with hypoxia: Secondary | ICD-10-CM

## 2015-03-28 NOTE — Progress Notes (Signed)
Physical Therapy Treatment Patient Details Name: [name] MRN: [id number] DOB: 1935-12-29 Today's Date: 03/28/2015    History of Present Illness Patient is a 79 yo female admitted 03/25/15 with shoulder impingement syndrome.  Patient is s/p arthroscopic shoulder repair.      PT Comments    Patient progressing toward mobility goals with ability to ambulate 166ft and ascend/descend 3 steps before fatigued. Overall mobility level min guard/min A with need for supervision for OOB activities. Continue to progress as tolerated with anticipated d/c home with home health PT for improved independence and safety with mobility.  Follow Up Recommendations  Home health PT;Supervision for mobility/OOB     Equipment Recommendations  Cane    Recommendations for Other Services       Precautions / Restrictions Precautions Precautions: Shoulder;Fall Type of Shoulder Precautions: sling for comfort, ad lib use of R UE after nerve block wears off Shoulder Interventions: For comfort;Shoulder sling/immobilizer Required Braces or Orthoses: Sling Restrictions Weight Bearing Restrictions: Yes RUE Weight Bearing: Non weight bearing    Mobility  Bed Mobility Overal bed mobility: Needs Assistance Bed Mobility: Rolling;Sidelying to Sit;Sit to Sidelying Rolling: Min guard Sidelying to sit: Min assist     Sit to sidelying: Min assist General bed mobility comments: cues for technique and rolling to L side; assistance bringing B LE onto bed from sitting and cues for sidelying to sitting  Transfers Overall transfer level: Needs assistance Equipment used: None Transfers: Sit to/from Stand (from EOB, commode, and w/c) Sit to Stand: Min guard         General transfer comment: min guard for safety; cues for hand placement and increased safety awareness; impulsive behavior during session  Ambulation/Gait Ambulation/Gait assistance: Min guard Ambulation Distance (Feet): 120 Feet Assistive  device: Straight cane Gait Pattern/deviations: Step-through pattern;Decreased stride length Gait velocity: decreased   General Gait Details: cues for sequencing of gait pattern with AD for increased safety and deficits listed above; pt demonstrated unsteadiness with gait   Stairs Stairs: Yes Stairs assistance: Min guard Stair Management: One rail Left;Forwards;Step to pattern Number of Stairs: 3 General stair comments: pt requires close guard due to balance deficits and cues and extra time to steady self prior to ascending next step  Wheelchair Mobility    Modified Rankin (Stroke Patients Only)       Balance Overall balance assessment: Needs assistance Sitting-balance support: Feet supported Sitting balance-Leahy Scale: Good     Standing balance support: Single extremity supported Standing balance-Leahy Scale: Fair                      Cognition Arousal/Alertness: Awake/alert Behavior During Therapy: WFL for tasks assessed/performed Overall Cognitive Status: Within Functional Limits for tasks assessed                      Exercises      General Comments        Pertinent Vitals/Pain Pain Assessment: Faces Faces Pain Scale: Hurts a little bit Pain Location: R shoulder Pain Descriptors / Indicators: Sore Pain Intervention(s): Monitored during session;Premedicated before session;Repositioned    Home Living Family/patient expects to be discharged to:: Private residence Living Arrangements: Other relatives Available Help at Discharge: Family;Available 24 hours/day Type of Home: House Home Access: Stairs to enter   Home Layout: One level Home Equipment: Bedside commode;Shower seat Additional Comments: Patient is going to sister's home at d/c.  Sister is available 24/7 to assist patient.    Prior Function  Level of Independence: Independent      Comments: Patient drives. Uses stair lift at her home.   PT Goals (current goals can now be found  in the care plan section) Acute Rehab PT Goals Patient Stated Goal: go home PT Goal Formulation: With patient Time For Goal Achievement: 04/03/15 Potential to Achieve Goals: Good Progress towards PT goals: Progressing toward goals    Frequency  Min 3X/week    PT Plan Current plan remains appropriate    Co-evaluation             End of Session Equipment Utilized During Treatment: Gait belt;Oxygen (Sling) Activity Tolerance: Patient limited by fatigue;Patient limited by pain Patient left: in bed;with call bell/phone within reach;with family/visitor present     Time: 1110-1143 PT Time Calculation (min) (ACUTE ONLY): 33 min  Charges:  $Gait Training: 8-22 mins $Therapeutic Activity: 8-22 mins                    G CodesDarliss Cheney, PTA [phone] 03/28/2015, 12:04 PM

## 2015-03-28 NOTE — Evaluation (Signed)
Clinical/Bedside Swallow Evaluation Patient Details  Name: [name] MRN: [id number] Date of Birth: 04-Feb-1936  Today's Date: 03/28/2015 Time: SLP Start Time (ACUTE ONLY): 1000 SLP Stop Time (ACUTE ONLY): 1015 SLP Time Calculation (min) (ACUTE ONLY): 15 min  Past Medical History:  Past Medical History  Diagnosis Date  . GERD (gastroesophageal reflux disease)   . PONV (postoperative nausea and vomiting)   . Arthritis   . COPD (chronic obstructive pulmonary disease) (HCC)     PULMOLOGIST-  DR [name]--  GOLD III W/  CHRONIC RESPIRATORY FAILURE--  O2 DEPENDENT  . Coronary arteriosclerosis   . Chronic respiratory failure ([place])   . Supplemental oxygen dependent     2L  via Nasal Canula --  activity and nighttime  . Urinary incontinence in female   . Hematuria   . Pelvic pain in female   . Bladder neoplasm   . Neuropathy, peripheral, idiopathic   . Mild obstructive sleep apnea     no cpap recommendation  . Cough productive of purulent sputum   . Chronic cough   . History of nephritis     as child dx w/ Bright's disease (glomerulonephritis)   Past Surgical History:  Past Surgical History  Procedure Laterality Date  . Tonsillectomy  as child  . Posterior lumbar fusion  10-07-2007    bilateral laminectomy L4 and bilateral L4-5 diskectomy w/ fusion  . Hemorrhoid surgery  1973  . Cardiac catheterization  05-08-2007  dr Marlou Porch    minor coronary plaquing w/ no significant CAD/  20% mLAD,  10% mRCA,  perserved LV, ef 60-65%  . Transthoracic echocardiogram  07-07-2011    normal echo,  ef 65-70%  . Appendectomy  1971  . Total abdominal hysterectomy w/ bilateral salpingoophorectomy  1973  . Cholecystectomy  1988  . Esophagogastroduodenoscopy  last one 09-09-2008  . Cataract extraction w/ intraocular lens  implant, bilateral  2001 approx  . Cystoscopy with biopsy N/A 09/24/2014    Procedure: CYSTOSCOPY WITH BIOPSY;  Surgeon: Festus Aloe, MD;  Location: [facility];   Service: Urology;  Laterality: N/A;  . Fulguration of bladder tumor N/A 09/24/2014    Procedure: FULGURATION OF BLADDER TUMOR;  Surgeon: Festus Aloe, MD;  Location: [facility];  Service: Urology;  Laterality: N/A;  . Cystoscopy w/ retrogrades Bilateral 09/24/2014    Procedure: CYSTOSCOPY WITH RETROGRADE PYELOGRAM;  Surgeon: Festus Aloe, MD;  Location: [facility];  Service: Urology;  Laterality: Bilateral;   HPI:  Patient is a 79 yo female admitted 03/25/15 with shoulder impingement syndrome. Patient is s/p arthroscopic shoulder repair. Hospitalization complicated by pneumonia. Pt describes hx of esophageal dysphagia requiring dilatation- records not available.    Assessment / Plan / Recommendation Clinical Impression  Pt presents with normal oropharyngeal swallow function with adequate mastication, brisk swallow response, and no s/s of aspiration.  There are no focal deficits.  Pt provides vague hx of multiple dilatations of esophagus, but records could not be found and pt unable to give dates.  She asserts that solid foods "lodge in her chest," particularly breads and meats.  Recommend esophageal w/u given persisting deficits - no further SLP is warranted.  Will sign off.        Diet Recommendation   regular/ thin liquids  Medication Administration: Whole meds with liquid    Other  Recommendations Recommended Consults: Consider esophageal assessment Oral Care Recommendations: Oral care BID   Follow up Recommendations  None    Frequency and  Duration            Prognosis        Swallow Study   General Date of Onset: 03/25/15 HPI: Patient is a 79 yo female admitted 03/25/15 with shoulder impingement syndrome. Patient is s/p arthroscopic shoulder repair. Hospitalization complicated by pneumonia. Pt describes hx of esophageal dysphagia requiring dilatation- records not available.  Type of Study: Bedside Swallow Evaluation Previous Swallow  Assessment: none per records Diet Prior to this Study: Regular;Thin liquids Temperature Spikes Noted: Yes Respiratory Status: Nasal cannula History of Recent Intubation: No Behavior/Cognition: Alert;Confused Oral Cavity Assessment: Within Functional Limits Oral Care Completed by SLP: No Oral Cavity - Dentition: Adequate natural dentition Vision: Functional for self-feeding Self-Feeding Abilities: Able to feed self Patient Positioning: Upright in chair Baseline Vocal Quality: Normal Volitional Cough: Strong Volitional Swallow: Able to elicit    Oral/Motor/Sensory Function Overall Oral Motor/Sensory Function: Within functional limits   Ice Chips Ice chips: Within functional limits Presentation: Spoon   Thin Liquid Thin Liquid: Within functional limits Presentation: Cup    Nectar Thick Nectar Thick Liquid: Not tested   Honey Thick Honey Thick Liquid: Not tested   Puree Puree: Within functional limits   Solid Solid: Within functional limits       Juan Quam Laurice 03/28/2015,10:21 AM

## 2015-03-28 NOTE — Care Management Note (Addendum)
Case Management Note  Patient Details  Name: [name] MRN: [id number] Date of Birth: 11-29-1935  Subjective/Objective:   79 yr old female s/p right Shoulder arthroscopy., arthroscopicdistal clavicle resection.            Action/Plan: Case manager spoke patient and her sister [name] concerning home health and DME needs. Choice was offered for Home health agency. Patient states that she [facility], CM contacted Miranda, [place] Liaison with referral. Patient has her own cane, [name] will recover at her sister's home, [name] [address] [place] , [place] ,[place] [address] [name] [phone]. Patient has concerns regarding getting oxygen at her sister's home. Case manager spoke with several customer service reps at [employer] who stated that she would have to get her oxygen picked up from her home and delivered to her sister's home. Unfortunately, there is none available to allow anyone to access her home. Case manager finally was able to speak with branch manager for [facility], who , after listening to entire request and situation, states that she will arrange for a portable tank to be delivered to the hospital and for oxygen setup to be delivered to patient's sister's home. CM thanked her for her assistance and explained this to the patient.   Expected Discharge Date:    03/28/15      Expected Discharge Plan:   Home with Home Health OT/PT  In-House Referral:  NA  Discharge planning Services  CM Consult  Post Acute Care Choice:  Durable Medical Equipment Choice offered to:  Patient  DME Arranged:  Oxygen DME Agency:  [facility]  HH Arranged:  PT, OT [place] Agency:  [facility]  Status of Service:  Completed, signed off  Medicare Important Message Given:    Date Medicare IM Given:    Medicare IM give by:    Date Additional Medicare IM Given:    Additional Medicare Important Message give by:     If discussed at  [place] of Stay Meetings, dates discussed:    Additional Comments:  Ninfa Meeker, RN 03/28/2015, 11:23 AM

## 2015-03-28 NOTE — Progress Notes (Signed)
[facility] Progress Note   [name]  [id number]  DOB: 1935/05/03  DOA: 03/25/2015 PCP: [name], MD  Brief narrative: [name] is a 79 y.o. female with a history of COPD on 2 L of oxygen at home and gastritis or reflux disease. The patient was hospitalized for arthroscopy for right shoulder impingement. [facility] was consulted due to malaise on 2/3. She was noted to be hypoxic and was requiring 4 L of oxygen rather than her usual 2. She did not complain of a cough or shortness of breath but chest x-ray revealed bilateral lower lobe infiltrates consistent with pneumonia. She was started on antibiotics.   Subjective: Cough improving. No chest pain. No new complaints.   Assessment/Plan: Principal Problem:   Pneumonia -Possible aspiration versus community-acquired-add Zithromax to Zosyn-obtain SLP eval completed- recommended thin liquids- no signs of aspiration noted but she did state that solids sometimes lodge in her chest- will get esophagram    Active Problems:    Chronic respiratory failure  -On 2 L of oxygen chronically secondary to COPD     Shoulder impingement syndrome -Per surgery  Dysuria x 2 weeks - She is status post cystoscopy with bladder biopsy and fulguration in June. On cystoscopy, patient had an ulcerative appearing lesion along the left bladder neck. Biopsy negative for malignancy, just markedly inflamed tissue.  - Obtained u/a- small amount of white blood cells-no dysuria - doubtful this is a UTI   Code Status:     Code Status Orders        Start     Ordered   03/25/15 1055  Full code   Continuous     03/25/15 1054     Disposition Plan: Continue to wean O2  Antibiotics: Anti-infectives    Start     Dose/Rate Route Frequency Ordered Stop   03/27/15 1400  azithromycin (ZITHROMAX) 500 mg in dextrose 5 % 250 mL IVPB     500 mg 250 mL/hr over 60 Minutes Intravenous Every 24 hours 03/27/15 1323     03/27/15 1200   levofloxacin (LEVAQUIN) IVPB 500 mg  Status:  Discontinued     500 mg 100 mL/hr over 60 Minutes Intravenous Every 24 hours 03/27/15 1111 03/27/15 1322   03/26/15 1515  piperacillin-tazobactam (ZOSYN) IVPB 3.375 g     3.375 g 12.5 mL/hr over 240 Minutes Intravenous 3 times per day 03/26/15 1508     03/25/15 0600  ceFAZolin (ANCEF) IVPB 2 g/50 mL premix     2 g 100 mL/hr over 30 Minutes Intravenous On call to O.R. 03/24/15 1332 03/25/15 0753      Objective: Filed Weights   03/25/15 0640  Weight: 83.008 kg (183 lb)    Intake/Output Summary (Last 24 hours) at 03/28/15 1334 Last data filed at 03/28/15 0900  Gross per 24 hour  Intake    890 ml  Output      0 ml  Net    890 ml     Vitals Filed Vitals:   03/27/15 1300 03/27/15 2025 03/27/15 2331 03/28/15 0618  BP: 99/64 125/48  112/82  Pulse: 77 98  81  Temp: 98.3 F (36.8 C) 98.6 F (37 C)  99 F (37.2 C)  TempSrc: Oral Oral  Oral  Resp: 17 18  18   Height:      Weight:      SpO2: 92% 94% 95% 92%    Exam:  General:  Pt is alert, not in acute distress  HEENT: No  icterus, No thrush, oral mucosa moist  Cardiovascular: regular rate and rhythm, S1/S2 No murmur  Respiratory: clear to auscultation bilaterally - on 3 L of oxygen-pulse ox 92%  Abdomen: Soft, +Bowel sounds, non tender, non distended, no guarding  MSK: No LE edema, cyanosis or clubbing  Data Reviewed: Basic Metabolic Panel:  Recent Labs Lab 03/25/15 0642 03/26/15 1310  NA 138 135  K 4.1 3.9  CL 99* 94*  CO2 29 33*  GLUCOSE 103* 120*  BUN 20 9  CREATININE 0.66 0.78  CALCIUM 9.1 8.3*   Liver Function Tests:  Recent Labs Lab 03/25/15 0642  AST 18  ALT 13*  ALKPHOS 59  BILITOT 0.7  PROT 6.6  ALBUMIN 3.8   No results for input(s): LIPASE, AMYLASE in the last 168 hours. No results for input(s): AMMONIA in the last 168 hours. CBC:  Recent Labs Lab 03/25/15 0642 03/26/15 1310  WBC 7.9 11.9*  NEUTROABS 5.4  --   HGB 12.2 11.8*  HCT  38.5 38.5  MCV 93.0 96.0  PLT 334 287   Cardiac Enzymes:  Recent Labs Lab 03/26/15 1310 03/26/15 1900 03/27/15  TROPONINI <0.03 <0.03 <0.03   BNP (last 3 results) No results for input(s): BNP in the last 8760 hours.  ProBNP (last 3 results) No results for input(s): PROBNP in the last 8760 hours.  CBG: No results for input(s): GLUCAP in the last 168 hours.  No results found for this or any previous visit (from the past 240 hour(s)).   Studies: No results found.  Scheduled Meds:  Scheduled Meds: . aspirin EC  81 mg Oral Daily  . atorvastatin  40 mg Oral q morning - 10a  . azithromycin  500 mg Intravenous Q24H  . budesonide-formoterol  2 puff Inhalation BID  . gabapentin  200 mg Oral q morning - 10a  . gabapentin  300 mg Oral QPM  . loratadine  10 mg Oral Daily  . pantoprazole  40 mg Oral Daily  . piperacillin-tazobactam (ZOSYN)  IV  3.375 g Intravenous 3 times per day  . sertraline  100 mg Oral q morning - 10a  . tiotropium  18 mcg Inhalation q morning - 10a   Continuous Infusions: . lactated ringers 50 mL/hr at 03/26/15 2149    Time spent on care of this patient: 35 min   [place], MD 03/28/2015, 1:34 PM  LOS: 1 day   [facility] Office  [phone] Pager - Text Page per [email or web address] If 7PM-7AM, please contact night-coverage [email or web address]

## 2015-03-28 NOTE — Evaluation (Signed)
Occupational Therapy Evaluation Patient Details Name: [name] MRN: [id number] DOB: Aug 30, 1935 Today's Date: 03/28/2015    History of Present Illness Patient is a 79 yo female admitted 03/25/15 with shoulder impingement syndrome.  Patient is s/p arthroscopic shoulder repair. Hospitalization complicated by pneumonia.     Clinical Impression   Pt was independent in ADL prior to admission.  She is 02 dependent at baseline, drives and uses a stair lift at her home to access the second floor.  Pt presents with lethargy and slow processing which may be pain medicine related. She demonstrates decreased balance.  Began instruction in compensatory strategies for ADL, positioning R UE in bed, chair and sling.  Plan is to go home with her sister who was present for session, but minimally participating.    Follow Up Recommendations  Home health OT;Supervision/Assistance - 24 hour    Equipment Recommendations  None recommended by OT    Recommendations for Other Services       Precautions / Restrictions Precautions Precautions: Shoulder;Fall Type of Shoulder Precautions: sling for comfort, ad lib use of R UE after nerve block wears off Shoulder Interventions: For comfort;Shoulder sling/immobilizer Required Braces or Orthoses: Sling Restrictions Weight Bearing Restrictions: Yes RUE Weight Bearing: Non weight bearing      Mobility Bed Mobility Overal bed mobility: Needs Assistance Bed Mobility: Rolling;Sidelying to Sit Rolling: Min guard Sidelying to sit: Min guard       General bed mobility comments: no physical assist, cues to avoid pushing up with R UE  Transfers Overall transfer level: Needs assistance Equipment used: None Transfers: Sit to/from Stand Sit to Stand: Min guard         General transfer comment: close guard assist    Balance Overall balance assessment: Needs assistance Sitting-balance support: Feet supported Sitting balance-Leahy Scale: Good        Standing balance-Leahy Scale: Fair                              ADL Overall ADL's : Needs assistance/impaired Eating/Feeding: Set up;Sitting Eating/Feeding Details (indicate cue type and reason): has been eating with L hand Grooming: Wash/dry hands;Standing;Supervision/safety   Upper Body Bathing: Minimal assitance;Sitting;Cueing for compensatory techniques   Lower Body Bathing: Sit to/from stand;Cueing for compensatory techniques;Minimal assistance   Upper Body Dressing : Minimal assistance;Sitting;Cueing for compensatory techniques   Lower Body Dressing: Moderate assistance;Sit to/from stand   Toilet Transfer: Min guard;Ambulation;Comfort height toilet   Toileting- Clothing Manipulation and Hygiene: Supervision/safety;Sitting/lateral lean       Functional mobility during ADLs: Min guard General ADL Comments: Pt and sister educated in compensatory strategies for ADL, sling use and positioning R UE in bed and chair for comfort.     Vision     Perception     Praxis      Pertinent Vitals/Pain Pain Assessment: Faces Faces Pain Scale: Hurts little more Pain Location: R shoulder Pain Descriptors / Indicators: Sore;Operative site guarding Pain Intervention(s): Limited activity within patient's tolerance;Monitored during session;Premedicated before session;Repositioned     Hand Dominance Right   Extremity/Trunk Assessment Upper Extremity Assessment Upper Extremity Assessment: RUE deficits/detail RUE Deficits / Details: did not formally assess shoulder , full AROM elbow to hand RUE Coordination: decreased fine motor   Lower Extremity Assessment Lower Extremity Assessment: Defer to PT evaluation       Communication Communication Communication: No difficulties   Cognition Arousal/Alertness: Lethargic;Suspect due to medications Behavior During Therapy: Flat affect  Overall Cognitive Status: Within Functional Limits for tasks assessed (slow to repond, may  be related to medication)                     General Comments       Exercises       Shoulder Instructions      Home Living Family/patient expects to be discharged to:: Private residence Living Arrangements: Other relatives Available Help at Discharge: Family;Available 24 hours/day Type of Home: House Home Access: Stairs to enter [employer] of Steps: 2   Home Layout: One level     Bathroom Shower/Tub: Occupational psychologist: Handicapped height Bathroom Accessibility: Yes How Accessible: Accessible via walker Home Equipment: Bedside commode;Shower seat   Additional Comments: Patient is going to sister's home at d/c.  Sister is available 24/7 to assist patient.      Prior Functioning/Environment Level of Independence: Independent        Comments: Patient drives. Uses stair lift at her home.    OT Diagnosis: Generalized weakness;Acute pain   OT Problem List: Decreased range of motion;Decreased strength;Decreased activity tolerance;Impaired balance (sitting and/or standing);Decreased coordination;Cardiopulmonary status limiting activity;Impaired UE functional use;Pain   OT Treatment/Interventions: Self-care/ADL training;DME and/or AE instruction;Patient/family education;Therapeutic activities    OT Goals(Current goals can be found in the care plan section) Acute Rehab OT Goals Patient Stated Goal: To decrease pain OT Goal Formulation: With patient Time For Goal Achievement: 04/04/15 Potential to Achieve Goals: Good  OT Frequency: Min 2X/week   Barriers to D/C:            Co-evaluation              End of Session Equipment Utilized During Treatment: Oxygen (4L) Nurse Communication: Mobility status (CM regarding no 02 at sister's home)  Activity Tolerance: Patient limited by lethargy Patient left: in chair;with call bell/phone within reach;with family/visitor present;with nursing/sitter in room   Time: [id number] OT Time  Calculation (min): 28 min Charges:  OT General Charges $OT Visit: 1 Procedure OT Evaluation $Initial OT Evaluation Tier I: 1 Procedure OT Treatments $Self Care/Home Management : 8-22 mins G-Codes:    Malka So 03/28/2015, 9:50 AM  [phone]

## 2015-03-29 DIAGNOSIS — J9601 Acute respiratory failure with hypoxia: Secondary | ICD-10-CM

## 2015-03-29 DIAGNOSIS — M7542 Impingement syndrome of left shoulder: Secondary | ICD-10-CM

## 2015-03-29 MED ORDER — AZITHROMYCIN 500 MG PO TABS: 500.0000 mg | ORAL_TABLET | Freq: Every day | ORAL | Status: DC

## 2015-03-29 MED ORDER — AZITHROMYCIN 500 MG PO TABS
500.0000 mg | ORAL_TABLET | Freq: Every day | ORAL | Status: DC
  Administered 2015-03-29: 500 mg via ORAL
  Filled 2015-03-29: qty 1

## 2015-03-29 MED ORDER — AMOXICILLIN-POT CLAVULANATE 500-125 MG PO TABS: 1.0000 | ORAL_TABLET | Freq: Three times a day (TID) | ORAL | Status: DC

## 2015-03-29 MED ORDER — LEVOFLOXACIN 500 MG PO TABS: 500.0000 mg | ORAL_TABLET | Freq: Every day | ORAL | Status: DC

## 2015-03-29 NOTE — Progress Notes (Addendum)
[facility] Progress Note   [name]  [id number]  DOB: [birth year]  DOA: 03/25/2015 PCP: [name], MD  Brief narrative: [name] is a 79 y.o. female with a history of COPD on 2 L of oxygen at home and gastritis or reflux disease. The patient was hospitalized for arthroscopy for right shoulder impingement. [facility] was consulted due to malaise on 2/3. She was noted to be hypoxic and was requiring 4 L of oxygen rather than her usual 2. She did not complain of a cough or shortness of breath initially but chest x-ray revealed bilateral lower lobe infiltrates consistent with pneumonia. She was started on antibiotics. She has subsequently developed a cough with green sputum which is improving.    Subjective: Cough continues to improve. No chest pain. No new complaints.   Assessment/Plan: Principal Problem:  Bilatral Pneumonia/ acute respiratory failure -Possible aspiration versus community-acquired-added Zithromax to Zosyn-obtain SLP eval completed- recommended thin liquids- no signs of aspiration noted but she did state that solids (bread and meats) often lodge in her chest and she ends up regurgitating this food - unfortunately unable to tolerate the sophagram  Due to severe claustrophobia - now back to 2 L O2 which is her baseline O2 requirement - Recommendation: avoid food that causes dysphagia, f/u with GI once pneumonia resolves for EGD- change antibiotics to Unasyn (7 more days) and Zithromax (3 more days) - f/u with PCP on Monday.   Active Problems:    Chronic respiratory failure  -On 2 L of oxygen chronically secondary to COPD     Shoulder impingement syndrome -Per surgery- of note, she did not received general anaesthesia for this surgery  Dysuria x 2 weeks - She is status post cystoscopy with bladder biopsy and fulguration in June. On cystoscopy, patient had an ulcerative appearing lesion along the left bladder neck. Biopsy negative for  malignancy, just markedly inflamed tissue.  - Obtained u/a- small amount of white blood cells-no dysuria - doubtful this is a UTI   Code Status:     Code Status Orders        Start     Ordered   03/25/15 1055  Full code   Continuous     03/25/15 1054     Disposition Plan: Continue to wean O2  Antibiotics: Anti-infectives    Start     Dose/Rate Route Frequency Ordered Stop   03/27/15 1400  azithromycin (ZITHROMAX) 500 mg in dextrose 5 % 250 mL IVPB     500 mg 250 mL/hr over 60 Minutes Intravenous Every 24 hours 03/27/15 1323     03/27/15 1200  levofloxacin (LEVAQUIN) IVPB 500 mg  Status:  Discontinued     500 mg 100 mL/hr over 60 Minutes Intravenous Every 24 hours 03/27/15 1111 03/27/15 1322   03/26/15 1515  piperacillin-tazobactam (ZOSYN) IVPB 3.375 g     3.375 g 12.5 mL/hr over 240 Minutes Intravenous 3 times per day 03/26/15 1508     03/25/15 0600  ceFAZolin (ANCEF) IVPB 2 g/50 mL premix     2 g 100 mL/hr over 30 Minutes Intravenous On call to O.R. 03/24/15 1332 03/25/15 0753      Objective: Filed Weights   03/25/15 0640  Weight: 83.008 kg (183 lb)    Intake/Output Summary (Last 24 hours) at 03/29/15 1004 Last data filed at 03/29/15 0600  Gross per 24 hour  Intake    220 ml  Output      0 ml  Net  220 ml     Vitals Filed Vitals:   03/28/15 2028 03/28/15 2116 03/29/15 0651 03/29/15 0933  BP:  101/46 137/54   Pulse:  83 69   Temp:  98.2 F (36.8 C) 98.1 F (36.7 C)   TempSrc:  Oral Oral   Resp:  16 16   Height:      Weight:      SpO2: 94% 92% 93% 91%    Exam:  General:  Pt is alert, not in acute distress  HEENT: No icterus, No thrush, oral mucosa moist  Cardiovascular: regular rate and rhythm, S1/S2 No murmur  Respiratory: clear to auscultation bilaterally - on 2L of oxygen-pulse ox 94%  Abdomen: Soft, +Bowel sounds, non tender, non distended, no guarding  MSK: No LE edema, cyanosis or clubbing  Data Reviewed: Basic Metabolic  Panel:  Recent Labs Lab 03/25/15 0642 03/26/15 1310  NA 138 135  K 4.1 3.9  CL 99* 94*  CO2 29 33*  GLUCOSE 103* 120*  BUN 20 9  CREATININE 0.66 0.78  CALCIUM 9.1 8.3*   Liver Function Tests:  Recent Labs Lab 03/25/15 0642  AST 18  ALT 13*  ALKPHOS 59  BILITOT 0.7  PROT 6.6  ALBUMIN 3.8   No results for input(s): LIPASE, AMYLASE in the last 168 hours. No results for input(s): AMMONIA in the last 168 hours. CBC:  Recent Labs Lab 03/25/15 0642 03/26/15 1310  WBC 7.9 11.9*  NEUTROABS 5.4  --   HGB 12.2 11.8*  HCT 38.5 38.5  MCV 93.0 96.0  PLT 334 287   Cardiac Enzymes:  Recent Labs Lab 03/26/15 1310 03/26/15 1900 03/27/15  TROPONINI <0.03 <0.03 <0.03   BNP (last 3 results) No results for input(s): BNP in the last 8760 hours.  ProBNP (last 3 results) No results for input(s): PROBNP in the last 8760 hours.  CBG: No results for input(s): GLUCAP in the last 168 hours.  No results found for this or any previous visit (from the past 240 hour(s)).   Studies: No results found.  Scheduled Meds:  Scheduled Meds: . aspirin EC  81 mg Oral Daily  . atorvastatin  40 mg Oral q morning - 10a  . azithromycin  500 mg Intravenous Q24H  . budesonide-formoterol  2 puff Inhalation BID  . gabapentin  200 mg Oral q morning - 10a  . gabapentin  300 mg Oral QPM  . loratadine  10 mg Oral Daily  . pantoprazole  40 mg Oral Daily  . piperacillin-tazobactam (ZOSYN)  IV  3.375 g Intravenous 3 times per day  . sertraline  100 mg Oral q morning - 10a  . tiotropium  18 mcg Inhalation q morning - 10a   Continuous Infusions: . lactated ringers 50 mL/hr at 03/26/15 2149    Time spent on care of this patient: 35 min   [place], MD 03/29/2015, 10:04 AM  LOS: 2 days   [facility] Office  [phone] Pager - Text Page per [email or web address] If 7PM-7AM, please contact night-coverage [email or web address]

## 2015-03-29 NOTE — Progress Notes (Signed)
Dr Aron Baba office called to see if he wants to d/c pt the patient has been d/cd medically.

## 2015-03-29 NOTE — Progress Notes (Signed)
Occupational Therapy Treatment Patient Details Name: [name] MRN: [id number] DOB: May 01, 1935 Today's Date: 03/29/2015    History of present illness Patient is a 79 yo female admitted 03/25/15 with shoulder impingement syndrome.  Patient is s/p arthroscopic shoulder repair.     OT comments  Pt making gradual progress toward OT goals. Pt currently min guard for toilet transfer with ambulation. Educated pt and sister on need for supervision during mobility and ADLs, home safety, sling use and RUE positioning; pt and sister verbalize understanding. Will continue to follow pt acutely.    Follow Up Recommendations  Home health OT;Supervision/Assistance - 24 hour    Equipment Recommendations  None recommended by OT    Recommendations for Other Services      Precautions / Restrictions Precautions Precautions: Shoulder;Fall Type of Shoulder Precautions: sling for comfort, ad lib use of R UE after nerve block wears off Shoulder Interventions: For comfort;Shoulder sling/immobilizer Required Braces or Orthoses: Sling Restrictions Weight Bearing Restrictions: Yes RUE Weight Bearing: Non weight bearing       Mobility Bed Mobility Overal bed mobility: Needs Assistance Bed Mobility: Supine to Sit;Sit to Supine   Sidelying to sit: Supervision;HOB elevated Supine to sit: Supervision (use of rails)        Transfers Overall transfer level: Needs assistance Equipment used: None Transfers: Sit to/from Stand Sit to Stand: Min guard         General transfer comment: Min guard for safety; no physical assist required. Good hand placement and technique.     Balance Overall balance assessment: Needs assistance Sitting-balance support: Feet supported Sitting balance-Leahy Scale: Good     Standing balance support: No upper extremity supported Standing balance-Leahy Scale: Fair                     ADL Overall ADL's : Needs assistance/impaired     Grooming: Wash/dry  Radiographer, therapeutic: Min guard;Ambulation;BSC   Toileting- Clothing Manipulation and Hygiene: Supervision/safety;Sitting/lateral lean   Tub/ Shower Transfer: Min guard;Walk-in shower;Ambulation   Functional mobility during ADLs: Min guard General ADL Comments: Sister present for OT session. Educated on sling use and positioning, positioning of RUE in bed, need for supervision for safety during ADLs and mobility upon return home, home safety; pt verbalized understanding. Discussed at length with pt about need for supervision during mobility upon d/c; pt reluctant stating that she doesnt want to bother her sister that much. Discussed need for supervision for safety; pt agreed and understands. SpO2 in high 80s sitting EOB on RA; 2L O2 reapplied prior to ambulation-SpO2 in mid 90s.      Vision                     Perception     Praxis      Cognition   Behavior During Therapy: WFL for tasks assessed/performed Overall Cognitive Status: Within Functional Limits for tasks assessed                       Extremity/Trunk Assessment               Exercises     Shoulder Instructions       General Comments      Pertinent Vitals/ Pain       Pain Assessment: 0-10 Pain Score: 8  Pain Location: R shoulder Pain Descriptors / Indicators:  Sore Pain Intervention(s): Limited activity within patient's tolerance;Monitored during session;Repositioned  Home Living                                          Prior Functioning/Environment              Frequency Min 2X/week     Progress Toward Goals  OT Goals(current goals can now be found in the care plan section)  Progress towards OT goals: Progressing toward goals  Acute Rehab OT Goals Patient Stated Goal: to go home OT Goal Formulation: With patient  Plan Discharge plan remains appropriate    Co-evaluation                 End  of Session Equipment Utilized During Treatment: Oxygen (2L)   Activity Tolerance Patient tolerated treatment well   Patient Left in bed;with call bell/phone within reach;with family/visitor present   Nurse Communication          Time: [id number] OT Time Calculation (min): 27 min  Charges: OT General Charges $OT Visit: 1 Procedure OT Treatments $Self Care/Home Management : 23-37 mins  Binnie Kand M.S., OTR/L Pager: [phone]  03/29/2015, 10:24 AM

## 2015-03-29 NOTE — Progress Notes (Signed)
[name]  MRN: [id number] DOB/Age: Feb 06, 1936 79 y.o. Physician: Ander Slade, M.D. 4 Days Post-Op Procedure(s) (LRB): RIGHT SHOULDER ARTHROSCOPY WITH SUBACROMIAL DECOMPRESSION AND DISTAL CLAVICLE RESECTION (Right)  Subjective: Patient reports feeling gradually better, breathing imporved Vital Signs Temp:  [98.1 F (36.7 C)-98.3 F (36.8 C)] 98.3 F (36.8 C) (12/06 1300) Pulse Rate:  [69-83] 70 (12/06 1300) Resp:  [16] 16 (12/06 1300) BP: (101-144)/(46-89) 144/89 mmHg (12/06 1300) SpO2:  [91 %-98 %] 98 % (12/06 1300)  Lab Results No results for input(s): WBC, HGB, HCT, PLT in the last 72 hours. BMET No results for input(s): NA, K, CL, CO2, GLUCOSE, BUN, CREATININE, CALCIUM in the last 72 hours. INR  Date Value Ref Range Status  03/25/2015 0.96 0.00 - 1.49 Final     Exam  Right shoulder portals clean and dry, resolving ecchymosis. guarded motion, reviewed ROM exercises and sling wear  Plan Discussed with medical team further management of pneumonia, will complete course of PO ABX as per medical team. F/u my office next week to begin PT. D/c this evening Westlee Devita M Laelah Siravo 03/29/2015, 6:13 PM    Contact # [phone]

## 2015-04-04 DIAGNOSIS — Z4789 Encounter for other orthopedic aftercare: Secondary | ICD-10-CM | POA: Diagnosis not present

## 2015-04-04 DIAGNOSIS — M7541 Impingement syndrome of right shoulder: Secondary | ICD-10-CM | POA: Diagnosis not present

## 2015-04-07 DIAGNOSIS — M7541 Impingement syndrome of right shoulder: Secondary | ICD-10-CM | POA: Diagnosis not present

## 2015-04-11 DIAGNOSIS — M7541 Impingement syndrome of right shoulder: Secondary | ICD-10-CM | POA: Diagnosis not present

## 2015-04-13 DIAGNOSIS — E559 Vitamin D deficiency, unspecified: Secondary | ICD-10-CM | POA: Diagnosis not present

## 2015-04-13 DIAGNOSIS — M8588 Other specified disorders of bone density and structure, other site: Secondary | ICD-10-CM | POA: Diagnosis not present

## 2015-04-13 DIAGNOSIS — K219 Gastro-esophageal reflux disease without esophagitis: Secondary | ICD-10-CM | POA: Diagnosis not present

## 2015-04-13 DIAGNOSIS — R296 Repeated falls: Secondary | ICD-10-CM | POA: Diagnosis not present

## 2015-04-13 DIAGNOSIS — R159 Full incontinence of feces: Secondary | ICD-10-CM | POA: Diagnosis not present

## 2015-04-13 DIAGNOSIS — E785 Hyperlipidemia, unspecified: Secondary | ICD-10-CM | POA: Diagnosis not present

## 2015-04-13 DIAGNOSIS — R310 Gross hematuria: Secondary | ICD-10-CM | POA: Diagnosis not present

## 2015-04-13 DIAGNOSIS — M8580 Other specified disorders of bone density and structure, unspecified site: Secondary | ICD-10-CM | POA: Diagnosis not present

## 2015-04-13 DIAGNOSIS — M25511 Pain in right shoulder: Secondary | ICD-10-CM | POA: Diagnosis not present

## 2015-04-13 DIAGNOSIS — F324 Major depressive disorder, single episode, in partial remission: Secondary | ICD-10-CM | POA: Diagnosis not present

## 2015-04-13 DIAGNOSIS — J449 Chronic obstructive pulmonary disease, unspecified: Secondary | ICD-10-CM | POA: Diagnosis not present

## 2015-04-13 DIAGNOSIS — Z23 Encounter for immunization: Secondary | ICD-10-CM | POA: Diagnosis not present

## 2015-04-13 DIAGNOSIS — Z791 Long term (current) use of non-steroidal anti-inflammatories (NSAID): Secondary | ICD-10-CM | POA: Diagnosis not present

## 2015-04-13 DIAGNOSIS — M4806 Spinal stenosis, lumbar region: Secondary | ICD-10-CM | POA: Diagnosis not present

## 2015-04-15 DIAGNOSIS — M7541 Impingement syndrome of right shoulder: Secondary | ICD-10-CM | POA: Diagnosis not present

## 2015-04-20 DIAGNOSIS — M7541 Impingement syndrome of right shoulder: Secondary | ICD-10-CM | POA: Diagnosis not present

## 2015-04-22 DIAGNOSIS — M7541 Impingement syndrome of right shoulder: Secondary | ICD-10-CM | POA: Diagnosis not present

## 2015-04-27 NOTE — Discharge Summary (Signed)
PATIENT ID:      [name]  MRN:     [id number] DOB/AGE:    78-24-1937 / 80 y.o.     DISCHARGE SUMMARY  ADMISSION DATE:    03/25/2015 DISCHARGE DATE:  03/29/2015  ADMISSION DIAGNOSIS: right shoulder impingement Past Medical History  Diagnosis Date  . GERD (gastroesophageal reflux disease)   . PONV (postoperative nausea and vomiting)   . Arthritis   . COPD (chronic obstructive pulmonary disease) (HCC)     PULMOLOGIST-  DR [name]--  GOLD III W/  CHRONIC RESPIRATORY FAILURE--  O2 DEPENDENT  . Coronary arteriosclerosis   . Chronic respiratory failure ([place])   . Supplemental oxygen dependent     2L  via Nasal Canula --  activity and nighttime  . Urinary incontinence in female   . Hematuria   . Pelvic pain in female   . Bladder neoplasm   . Neuropathy, peripheral, idiopathic   . Mild obstructive sleep apnea     no cpap recommendation  . Cough productive of purulent sputum   . Chronic cough   . History of nephritis     as child dx w/ Bright's disease (glomerulonephritis)    DISCHARGE DIAGNOSIS:   Principal Problem:   Pneumonia Active Problems:   ACID REFLUX DISEASE   Chronic respiratory failure (HCC)   Shoulder impingement syndrome   Malaise   Emesis   PROCEDURE: Procedure(s): RIGHT SHOULDER ARTHROSCOPY WITH SUBACROMIAL DECOMPRESSION AND DISTAL CLAVICLE RESECTION on 03/25/2015  CONSULTS:     HISTORY:  See H&P in chart.  HOSPITAL COURSE:  [name] is a 80 y.o. admitted on 03/25/2015 with a chief complaint of right shoulder pain with refractory to conservative treatment, and found to have a diagnosis of right shoulder impingement.  They were brought to the operating room on 03/25/2015 and underwent Procedure(s): RIGHT SHOULDER ARTHROSCOPY WITH SUBACROMIAL DECOMPRESSION AND DISTAL CLAVICLE RESECTION.    They were given perioperative antibiotics:  Anti-infectives    Start     Dose/Rate Route Frequency Ordered Stop   03/30/15 0000  azithromycin (ZITHROMAX) 500 MG  tablet     500 mg Oral Daily 03/29/15 1749     03/29/15 1200  azithromycin (ZITHROMAX) tablet 500 mg  Status:  Discontinued     500 mg Oral Daily 03/29/15 1155 03/29/15 2149   03/29/15 0000  amoxicillin-clavulanate (AUGMENTIN) 500-125 MG tablet     1 tablet Oral 3 times daily 03/29/15 1739     03/29/15 0000  levofloxacin (LEVAQUIN) 500 MG tablet  Status:  Discontinued     500 mg Oral Daily 03/29/15 1739 03/29/15    03/27/15 1400  azithromycin (ZITHROMAX) 500 mg in dextrose 5 % 250 mL IVPB  Status:  Discontinued     500 mg 250 mL/hr over 60 Minutes Intravenous Every 24 hours 03/27/15 1323 03/29/15 1154   03/27/15 1200  levofloxacin (LEVAQUIN) IVPB 500 mg  Status:  Discontinued     500 mg 100 mL/hr over 60 Minutes Intravenous Every 24 hours 03/27/15 1111 03/27/15 1322   03/26/15 1515  piperacillin-tazobactam (ZOSYN) IVPB 3.375 g  Status:  Discontinued     3.375 g 12.5 mL/hr over 240 Minutes Intravenous 3 times per day 03/26/15 1508 03/29/15 2149   03/25/15 0600  ceFAZolin (ANCEF) IVPB 2 g/50 mL premix     2 g 100 mL/hr over 30 Minutes Intravenous On call to O.R. 03/24/15 1332 03/25/15 0753    .  Patient underwent the above named procedure and tolerated it  well. They were kept initially as an overnight stay due to significant pulmonary disease with sleep apnea and COPD.  The following day they were hemodynamically stable but unfortunately developed a post operative pneumonia. They were neurovascularly intact to the operative extremity. The Hospitalist were consulted for medical management and initiated treatment with antibiotics. From a medical standpoint she stabilized through the weekend and was transitioned to oral antibiotics. They were medically and orthopaedically stable for discharge on 03/29/2015.    DIAGNOSTIC STUDIES:  RECENT RADIOGRAPHIC STUDIES :  No results found.  RECENT VITAL SIGNS:  No data found. Marland Kitchen  RECENT EKG RESULTS:    Orders placed or performed during the hospital  encounter of 03/25/15  . EKG 12-Lead  . EKG 12-Lead    DISCHARGE INSTRUCTIONS:    DISCHARGE MEDICATIONS:     Medication List    TAKE these medications        acetaminophen 650 MG CR tablet  Commonly known as:  TYLENOL  Take 650 mg by mouth 3 (three) times daily.     albuterol 108 (90 Base) MCG/ACT inhaler  Commonly known as:  PROVENTIL HFA;VENTOLIN HFA  Inhale 2 puffs into the lungs every 6 (six) hours as needed. For shortness of breath.     albuterol (2.5 MG/3ML) 0.083% nebulizer solution  Commonly known as:  PROVENTIL  Take 2.5 mg by nebulization 2 (two) times daily as needed. For shortness of breath.     amoxicillin-clavulanate 500-125 MG tablet  Commonly known as:  AUGMENTIN  Take 1 tablet (500 mg total) by mouth 3 (three) times daily.     aspirin EC 81 MG tablet  Take 81 mg by mouth daily.     atorvastatin 40 MG tablet  Commonly known as:  LIPITOR  Take 40 mg by mouth every morning.     azithromycin 500 MG tablet  Commonly known as:  ZITHROMAX  Take 1 tablet (500 mg total) by mouth daily.     budesonide-formoterol 160-4.5 MCG/ACT inhaler  Commonly known as:  SYMBICORT  Inhale 2 puffs into the lungs 2 (two) times daily.     CALCIUM + D PO  Take 1 tablet by mouth daily.     cetirizine 10 MG tablet  Commonly known as:  ZYRTEC  Take 10 mg by mouth every morning.     diazepam 2 MG tablet  Commonly known as:  VALIUM  Take 1 tablet (2 mg total) by mouth every 6 (six) hours as needed for muscle spasms or sedation.     esomeprazole 40 MG capsule  Commonly known as:  NEXIUM  Take 40 mg by mouth every morning.     gabapentin 100 MG capsule  Commonly known as:  NEURONTIN  Take 200-300 mg by mouth 2 (two) times daily. 2 pills in am, 3 in pm     oxyCODONE-acetaminophen 5-325 MG tablet  Commonly known as:  PERCOCET  Take 1-2 tablets by mouth every 4 (four) hours as needed.     sertraline 100 MG tablet  Commonly known as:  ZOLOFT  Take 100 mg by mouth every  morning.     tiotropium 18 MCG inhalation capsule  Commonly known as:  SPIRIVA  Place 18 mcg into inhaler and inhale every morning.     vitamin A 10000 UNIT capsule  Take 10,000 Units by mouth daily.     Vitamin B-12 1000 MCG Subl  Place 1 tablet under the tongue daily.     Vitamin D 2000 units tablet  Take 2,000 Units by mouth daily.     vitamin E 400 UNIT capsule  Take 400 Units by mouth daily.        FOLLOW UP VISIT:       Follow-up Information    Follow up with Metta Clines SUPPLE, MD.   Specialty:  Orthopedic Surgery   Why:  call to be seen in 7-10 days   Contact information:   [address] [place] [address] [phone]       Follow up with [facility].   Why:  Someone from [facility] will contact you concerning start date and time for therapy.   Contact information:   [address] [place] [address] [phone]       Follow up with [facility]   Why:  Huey Romans will deliver Oxygen to you at [place], [place]   Contact information:   [place]  [address] [phone]       DISCHARGE TO: Home  DISPOSITION: Improved DISCHARGE CONDITION:  Festus Barren for Dr. Justice Britain 04/27/2015, 3:24 PM

## 2015-05-14 DIAGNOSIS — Z4789 Encounter for other orthopedic aftercare: Secondary | ICD-10-CM | POA: Diagnosis not present

## 2015-06-15 DIAGNOSIS — Z4789 Encounter for other orthopedic aftercare: Secondary | ICD-10-CM | POA: Diagnosis not present

## 2015-06-15 DIAGNOSIS — M7541 Impingement syndrome of right shoulder: Secondary | ICD-10-CM | POA: Diagnosis not present

## 2015-06-20 DIAGNOSIS — M19049 Primary osteoarthritis, unspecified hand: Secondary | ICD-10-CM | POA: Diagnosis not present

## 2015-06-20 DIAGNOSIS — M150 Primary generalized (osteo)arthritis: Secondary | ICD-10-CM | POA: Diagnosis not present

## 2015-06-20 DIAGNOSIS — R768 Other specified abnormal immunological findings in serum: Secondary | ICD-10-CM | POA: Diagnosis not present

## 2015-06-20 DIAGNOSIS — M154 Erosive (osteo)arthritis: Secondary | ICD-10-CM | POA: Diagnosis not present

## 2015-07-13 DIAGNOSIS — S43431D Superior glenoid labrum lesion of right shoulder, subsequent encounter: Secondary | ICD-10-CM | POA: Diagnosis not present

## 2015-07-13 DIAGNOSIS — Z4789 Encounter for other orthopedic aftercare: Secondary | ICD-10-CM | POA: Diagnosis not present

## 2015-08-18 DIAGNOSIS — Z8719 Personal history of other diseases of the digestive system: Secondary | ICD-10-CM | POA: Diagnosis not present

## 2015-08-18 DIAGNOSIS — E559 Vitamin D deficiency, unspecified: Secondary | ICD-10-CM | POA: Diagnosis not present

## 2015-08-18 DIAGNOSIS — Z791 Long term (current) use of non-steroidal anti-inflammatories (NSAID): Secondary | ICD-10-CM | POA: Diagnosis not present

## 2015-08-18 DIAGNOSIS — K219 Gastro-esophageal reflux disease without esophagitis: Secondary | ICD-10-CM | POA: Diagnosis not present

## 2015-08-18 DIAGNOSIS — J449 Chronic obstructive pulmonary disease, unspecified: Secondary | ICD-10-CM | POA: Diagnosis not present

## 2015-08-18 DIAGNOSIS — E782 Mixed hyperlipidemia: Secondary | ICD-10-CM | POA: Diagnosis not present

## 2015-08-18 DIAGNOSIS — Z79899 Other long term (current) drug therapy: Secondary | ICD-10-CM | POA: Diagnosis not present

## 2015-08-18 DIAGNOSIS — J3089 Other allergic rhinitis: Secondary | ICD-10-CM | POA: Diagnosis not present

## 2015-08-18 DIAGNOSIS — M797 Fibromyalgia: Secondary | ICD-10-CM | POA: Diagnosis not present

## 2015-08-18 DIAGNOSIS — M8580 Other specified disorders of bone density and structure, unspecified site: Secondary | ICD-10-CM | POA: Diagnosis not present

## 2015-08-18 DIAGNOSIS — F3340 Major depressive disorder, recurrent, in remission, unspecified: Secondary | ICD-10-CM | POA: Diagnosis not present

## 2015-08-18 DIAGNOSIS — R296 Repeated falls: Secondary | ICD-10-CM | POA: Diagnosis not present

## 2015-09-07 DIAGNOSIS — S92255A Nondisplaced fracture of navicular [scaphoid] of left foot, initial encounter for closed fracture: Secondary | ICD-10-CM | POA: Diagnosis not present

## 2015-09-07 DIAGNOSIS — K219 Gastro-esophageal reflux disease without esophagitis: Secondary | ICD-10-CM | POA: Diagnosis not present

## 2015-09-07 DIAGNOSIS — M1990 Unspecified osteoarthritis, unspecified site: Secondary | ICD-10-CM | POA: Diagnosis not present

## 2015-09-07 DIAGNOSIS — J449 Chronic obstructive pulmonary disease, unspecified: Secondary | ICD-10-CM | POA: Diagnosis not present

## 2015-09-07 DIAGNOSIS — W0110XA Fall on same level from slipping, tripping and stumbling with subsequent striking against unspecified object, initial encounter: Secondary | ICD-10-CM | POA: Diagnosis not present

## 2015-09-09 DIAGNOSIS — S92255A Nondisplaced fracture of navicular [scaphoid] of left foot, initial encounter for closed fracture: Secondary | ICD-10-CM | POA: Diagnosis not present

## 2015-09-12 DIAGNOSIS — S92255D Nondisplaced fracture of navicular [scaphoid] of left foot, subsequent encounter for fracture with routine healing: Secondary | ICD-10-CM | POA: Diagnosis not present

## 2015-09-22 DIAGNOSIS — Z4789 Encounter for other orthopedic aftercare: Secondary | ICD-10-CM | POA: Diagnosis not present

## 2015-09-22 DIAGNOSIS — S92255D Nondisplaced fracture of navicular [scaphoid] of left foot, subsequent encounter for fracture with routine healing: Secondary | ICD-10-CM | POA: Diagnosis not present

## 2015-10-03 DIAGNOSIS — M150 Primary generalized (osteo)arthritis: Secondary | ICD-10-CM | POA: Diagnosis not present

## 2015-10-03 DIAGNOSIS — M19049 Primary osteoarthritis, unspecified hand: Secondary | ICD-10-CM | POA: Diagnosis not present

## 2015-10-03 DIAGNOSIS — M19042 Primary osteoarthritis, left hand: Secondary | ICD-10-CM | POA: Diagnosis not present

## 2015-10-03 DIAGNOSIS — R768 Other specified abnormal immunological findings in serum: Secondary | ICD-10-CM | POA: Diagnosis not present

## 2015-10-03 DIAGNOSIS — M19041 Primary osteoarthritis, right hand: Secondary | ICD-10-CM | POA: Diagnosis not present

## 2015-10-03 DIAGNOSIS — M154 Erosive (osteo)arthritis: Secondary | ICD-10-CM | POA: Diagnosis not present

## 2015-10-07 DIAGNOSIS — S92255D Nondisplaced fracture of navicular [scaphoid] of left foot, subsequent encounter for fracture with routine healing: Secondary | ICD-10-CM | POA: Diagnosis not present

## 2015-10-07 DIAGNOSIS — Z4789 Encounter for other orthopedic aftercare: Secondary | ICD-10-CM | POA: Diagnosis not present

## 2015-10-17 DIAGNOSIS — R768 Other specified abnormal immunological findings in serum: Secondary | ICD-10-CM | POA: Diagnosis not present

## 2015-10-17 DIAGNOSIS — M154 Erosive (osteo)arthritis: Secondary | ICD-10-CM | POA: Diagnosis not present

## 2015-10-17 DIAGNOSIS — M150 Primary generalized (osteo)arthritis: Secondary | ICD-10-CM | POA: Diagnosis not present

## 2015-10-17 DIAGNOSIS — M19049 Primary osteoarthritis, unspecified hand: Secondary | ICD-10-CM | POA: Diagnosis not present

## 2015-10-27 DIAGNOSIS — S92255D Nondisplaced fracture of navicular [scaphoid] of left foot, subsequent encounter for fracture with routine healing: Secondary | ICD-10-CM | POA: Diagnosis not present

## 2015-11-18 DIAGNOSIS — M2550 Pain in unspecified joint: Secondary | ICD-10-CM | POA: Diagnosis not present

## 2015-11-18 DIAGNOSIS — M5412 Radiculopathy, cervical region: Secondary | ICD-10-CM | POA: Diagnosis not present

## 2015-11-18 DIAGNOSIS — M150 Primary generalized (osteo)arthritis: Secondary | ICD-10-CM | POA: Diagnosis not present

## 2015-11-18 DIAGNOSIS — M154 Erosive (osteo)arthritis: Secondary | ICD-10-CM | POA: Diagnosis not present

## 2015-11-18 DIAGNOSIS — Z79899 Other long term (current) drug therapy: Secondary | ICD-10-CM | POA: Diagnosis not present

## 2016-02-15 DIAGNOSIS — E782 Mixed hyperlipidemia: Secondary | ICD-10-CM | POA: Diagnosis not present

## 2016-02-15 DIAGNOSIS — K219 Gastro-esophageal reflux disease without esophagitis: Secondary | ICD-10-CM | POA: Diagnosis not present

## 2016-02-15 DIAGNOSIS — Z79899 Other long term (current) drug therapy: Secondary | ICD-10-CM | POA: Diagnosis not present

## 2016-02-15 DIAGNOSIS — Z791 Long term (current) use of non-steroidal anti-inflammatories (NSAID): Secondary | ICD-10-CM | POA: Diagnosis not present

## 2016-02-15 DIAGNOSIS — M0579 Rheumatoid arthritis with rheumatoid factor of multiple sites without organ or systems involvement: Secondary | ICD-10-CM | POA: Diagnosis not present

## 2016-02-15 DIAGNOSIS — F3340 Major depressive disorder, recurrent, in remission, unspecified: Secondary | ICD-10-CM | POA: Diagnosis not present

## 2016-02-15 DIAGNOSIS — M8580 Other specified disorders of bone density and structure, unspecified site: Secondary | ICD-10-CM | POA: Diagnosis not present

## 2016-02-15 DIAGNOSIS — Z23 Encounter for immunization: Secondary | ICD-10-CM | POA: Diagnosis not present

## 2016-02-15 DIAGNOSIS — E559 Vitamin D deficiency, unspecified: Secondary | ICD-10-CM | POA: Diagnosis not present

## 2016-02-15 DIAGNOSIS — J449 Chronic obstructive pulmonary disease, unspecified: Secondary | ICD-10-CM | POA: Diagnosis not present

## 2016-02-15 DIAGNOSIS — M797 Fibromyalgia: Secondary | ICD-10-CM | POA: Diagnosis not present

## 2016-02-20 DIAGNOSIS — M2550 Pain in unspecified joint: Secondary | ICD-10-CM | POA: Diagnosis not present

## 2016-02-20 DIAGNOSIS — M154 Erosive (osteo)arthritis: Secondary | ICD-10-CM | POA: Diagnosis not present

## 2016-02-20 DIAGNOSIS — M150 Primary generalized (osteo)arthritis: Secondary | ICD-10-CM | POA: Diagnosis not present

## 2016-02-20 DIAGNOSIS — M5412 Radiculopathy, cervical region: Secondary | ICD-10-CM | POA: Diagnosis not present

## 2016-03-05 ENCOUNTER — Other Ambulatory Visit: Payer: Self-pay | Admitting: Family Medicine

## 2016-03-05 DIAGNOSIS — Z1231 Encounter for screening mammogram for malignant neoplasm of breast: Secondary | ICD-10-CM

## 2016-03-05 DIAGNOSIS — Z01419 Encounter for gynecological examination (general) (routine) without abnormal findings: Secondary | ICD-10-CM | POA: Diagnosis not present

## 2016-03-05 DIAGNOSIS — M8588 Other specified disorders of bone density and structure, other site: Secondary | ICD-10-CM | POA: Diagnosis not present

## 2016-03-05 DIAGNOSIS — N3946 Mixed incontinence: Secondary | ICD-10-CM | POA: Diagnosis not present

## 2016-03-05 DIAGNOSIS — J449 Chronic obstructive pulmonary disease, unspecified: Secondary | ICD-10-CM | POA: Diagnosis not present

## 2016-03-05 DIAGNOSIS — Z0000 Encounter for general adult medical examination without abnormal findings: Secondary | ICD-10-CM | POA: Diagnosis not present

## 2016-03-05 DIAGNOSIS — R0602 Shortness of breath: Secondary | ICD-10-CM | POA: Diagnosis not present

## 2016-03-05 DIAGNOSIS — F3341 Major depressive disorder, recurrent, in partial remission: Secondary | ICD-10-CM | POA: Diagnosis not present

## 2016-03-05 DIAGNOSIS — R159 Full incontinence of feces: Secondary | ICD-10-CM | POA: Diagnosis not present

## 2016-03-30 ENCOUNTER — Ambulatory Visit
Admission: RE | Admit: 2016-03-30 | Discharge: 2016-03-30 | Disposition: A | Discharge status: Home / Self Care | Payer: Medicare Other | Source: Ambulatory Visit | Attending: Family Medicine | Admitting: Family Medicine

## 2016-03-30 DIAGNOSIS — Z1231 Encounter for screening mammogram for malignant neoplasm of breast: Secondary | ICD-10-CM

## 2016-04-30 DIAGNOSIS — M8588 Other specified disorders of bone density and structure, other site: Secondary | ICD-10-CM | POA: Diagnosis not present

## 2016-04-30 DIAGNOSIS — M810 Age-related osteoporosis without current pathological fracture: Secondary | ICD-10-CM | POA: Diagnosis not present

## 2016-05-04 DIAGNOSIS — R310 Gross hematuria: Secondary | ICD-10-CM | POA: Diagnosis not present

## 2016-05-04 DIAGNOSIS — R350 Frequency of micturition: Secondary | ICD-10-CM | POA: Diagnosis not present

## 2016-05-20 ENCOUNTER — Encounter (HOSPITAL_COMMUNITY): Payer: Self-pay | Admitting: Emergency Medicine

## 2016-05-20 ENCOUNTER — Inpatient Hospital Stay (HOSPITAL_COMMUNITY)
Admission: EM | Admit: 2016-05-20 | Discharge: 2016-05-23 | DRG: 190 | Disposition: A | Discharge status: Home / Self Care | Payer: Medicare Other | Attending: Internal Medicine | Admitting: Internal Medicine

## 2016-05-20 ENCOUNTER — Emergency Department (HOSPITAL_COMMUNITY): Payer: Medicare Other

## 2016-05-20 DIAGNOSIS — J441 Chronic obstructive pulmonary disease with (acute) exacerbation: Secondary | ICD-10-CM | POA: Diagnosis not present

## 2016-05-20 DIAGNOSIS — J9621 Acute and chronic respiratory failure with hypoxia: Secondary | ICD-10-CM | POA: Diagnosis present

## 2016-05-20 DIAGNOSIS — Z79899 Other long term (current) drug therapy: Secondary | ICD-10-CM

## 2016-05-20 DIAGNOSIS — R0602 Shortness of breath: Secondary | ICD-10-CM | POA: Diagnosis not present

## 2016-05-20 DIAGNOSIS — D72829 Elevated white blood cell count, unspecified: Secondary | ICD-10-CM | POA: Diagnosis present

## 2016-05-20 DIAGNOSIS — Z825 Family history of asthma and other chronic lower respiratory diseases: Secondary | ICD-10-CM

## 2016-05-20 DIAGNOSIS — R739 Hyperglycemia, unspecified: Secondary | ICD-10-CM | POA: Diagnosis not present

## 2016-05-20 DIAGNOSIS — Z9981 Dependence on supplemental oxygen: Secondary | ICD-10-CM | POA: Diagnosis not present

## 2016-05-20 DIAGNOSIS — Z9049 Acquired absence of other specified parts of digestive tract: Secondary | ICD-10-CM

## 2016-05-20 DIAGNOSIS — R05 Cough: Secondary | ICD-10-CM | POA: Diagnosis not present

## 2016-05-20 DIAGNOSIS — T380X5A Adverse effect of glucocorticoids and synthetic analogues, initial encounter: Secondary | ICD-10-CM | POA: Diagnosis not present

## 2016-05-20 DIAGNOSIS — K219 Gastro-esophageal reflux disease without esophagitis: Secondary | ICD-10-CM | POA: Diagnosis not present

## 2016-05-20 DIAGNOSIS — D649 Anemia, unspecified: Secondary | ICD-10-CM

## 2016-05-20 DIAGNOSIS — Z87891 Personal history of nicotine dependence: Secondary | ICD-10-CM

## 2016-05-20 DIAGNOSIS — R000 Tachycardia, unspecified: Secondary | ICD-10-CM | POA: Diagnosis present

## 2016-05-20 DIAGNOSIS — R7989 Other specified abnormal findings of blood chemistry: Secondary | ICD-10-CM

## 2016-05-20 HISTORY — DX: Anemia, unspecified: D64.9

## 2016-05-20 HISTORY — DX: Hyperglycemia, unspecified: R73.9

## 2016-05-20 LAB — BASIC METABOLIC PANEL
Anion gap: 8 (ref 5–15)
BUN: 27 mg/dL — ABNORMAL HIGH (ref 6–20)
CALCIUM: 8.4 mg/dL — AB (ref 8.9–10.3)
CO2: 25 mmol/L (ref 22–32)
CREATININE: 1 mg/dL (ref 0.44–1.00)
Chloride: 104 mmol/L (ref 101–111)
GFR, EST NON AFRICAN AMERICAN: 53 mL/min — AB (ref >60)
Glucose, Bld: 115 mg/dL — ABNORMAL HIGH (ref 65–99)
Potassium: 4.4 mmol/L (ref 3.5–5.1)
SODIUM: 137 mmol/L (ref 135–145)

## 2016-05-20 LAB — CBC
HCT: 34.1 % — ABNORMAL LOW (ref 36.0–46.0)
Hemoglobin: 10.8 g/dL — ABNORMAL LOW (ref 12.0–15.0)
MCH: 28.6 pg (ref 26.0–34.0)
MCHC: 31.7 g/dL (ref 30.0–36.0)
MCV: 90.2 fL (ref 78.0–100.0)
PLATELETS: 403 10*3/uL — AB (ref 150–400)
RBC: 3.78 MIL/uL — AB (ref 3.87–5.11)
RDW: 13.4 % (ref 11.5–15.5)
WBC: 8.8 10*3/uL (ref 4.0–10.5)

## 2016-05-20 LAB — TROPONIN I
Troponin I: <0.03 ng/mL (ref <0.03)
Troponin I: <0.03 ng/mL (ref <0.03)

## 2016-05-20 LAB — D-DIMER, QUANTITATIVE (NOT AT ARMC): D DIMER QUANT: 1.32 ug{FEU}/mL — AB (ref 0.00–0.50)

## 2016-05-20 LAB — BRAIN NATRIURETIC PEPTIDE: B Natriuretic Peptide: 146.3 pg/mL — ABNORMAL HIGH (ref 0.0–100.0)

## 2016-05-20 MED ORDER — ALBUTEROL SULFATE (2.5 MG/3ML) 0.083% IN NEBU: 2.5000 mg | INHALATION_SOLUTION | RESPIRATORY_TRACT | Status: DC

## 2016-05-20 MED ORDER — ASPIRIN EC 81 MG PO TBEC
81.0000 mg | DELAYED_RELEASE_TABLET | Freq: Every day | ORAL | Status: DC
  Administered 2016-05-21 – 2016-05-23 (×3): 81 mg via ORAL
  Filled 2016-05-20 (×3): qty 1

## 2016-05-20 MED ORDER — ALBUTEROL SULFATE (2.5 MG/3ML) 0.083% IN NEBU
2.5000 mg | INHALATION_SOLUTION | Freq: Four times a day (QID) | RESPIRATORY_TRACT | Status: DC
  Administered 2016-05-21 (×2): 2.5 mg via RESPIRATORY_TRACT
  Filled 2016-05-20 (×2): qty 3

## 2016-05-20 MED ORDER — VITAMIN D 1000 UNITS PO TABS
2000.0000 [IU] | ORAL_TABLET | Freq: Every day | ORAL | Status: DC
Start: 2016-05-21 — End: 2016-05-23
  Administered 2016-05-21 – 2016-05-23 (×3): 2000 [IU] via ORAL
  Filled 2016-05-20 (×2): qty 2

## 2016-05-20 MED ORDER — TIOTROPIUM BROMIDE MONOHYDRATE 18 MCG IN CAPS
18.0000 ug | ORAL_CAPSULE | Freq: Every morning | RESPIRATORY_TRACT | Status: DC
Start: 2016-05-21 — End: 2016-05-21
  Administered 2016-05-21: 18 ug via RESPIRATORY_TRACT
  Filled 2016-05-20: qty 5

## 2016-05-20 MED ORDER — FAMOTIDINE 20 MG PO TABS
10.0000 mg | ORAL_TABLET | Freq: Two times a day (BID) | ORAL | Status: DC
  Administered 2016-05-20 – 2016-05-23 (×6): 10 mg via ORAL
  Filled 2016-05-20 (×6): qty 1

## 2016-05-20 MED ORDER — MOMETASONE FURO-FORMOTEROL FUM 200-5 MCG/ACT IN AERO
2.0000 | INHALATION_SPRAY | Freq: Two times a day (BID) | RESPIRATORY_TRACT | Status: DC
  Administered 2016-05-20 – 2016-05-21 (×2): 2 via RESPIRATORY_TRACT
  Filled 2016-05-20: qty 8.8

## 2016-05-20 MED ORDER — ATORVASTATIN CALCIUM 40 MG PO TABS
40.0000 mg | ORAL_TABLET | Freq: Every morning | ORAL | Status: DC
  Administered 2016-05-21 – 2016-05-23 (×3): 40 mg via ORAL
  Filled 2016-05-20 (×3): qty 1

## 2016-05-20 MED ORDER — VITAMIN E 180 MG (400 UNIT) PO CAPS
400.0000 [IU] | ORAL_CAPSULE | Freq: Every day | ORAL | Status: DC
  Administered 2016-05-21 – 2016-05-23 (×3): 400 [IU] via ORAL
  Filled 2016-05-20 (×3): qty 1

## 2016-05-20 MED ORDER — GABAPENTIN 300 MG PO CAPS: 300.0000 mg | ORAL_CAPSULE | Freq: Two times a day (BID) | ORAL | Status: DC

## 2016-05-20 MED ORDER — SODIUM CHLORIDE 0.9% FLUSH
3.0000 mL | Freq: Two times a day (BID) | INTRAVENOUS | Status: DC
  Administered 2016-05-21 – 2016-05-23 (×3): 3 mL via INTRAVENOUS

## 2016-05-20 MED ORDER — AZITHROMYCIN 500 MG IV SOLR
500.0000 mg | INTRAVENOUS | Status: DC
  Administered 2016-05-21 – 2016-05-22 (×2): 500 mg via INTRAVENOUS
  Filled 2016-05-20 (×3): qty 500

## 2016-05-20 MED ORDER — ALBUTEROL (5 MG/ML) CONTINUOUS INHALATION SOLN
10.0000 mg/h | INHALATION_SOLUTION | RESPIRATORY_TRACT | Status: DC
  Administered 2016-05-20: 10 mg/h via RESPIRATORY_TRACT
  Filled 2016-05-20: qty 20

## 2016-05-20 MED ORDER — VITAMIN B-12 1000 MCG SL SUBL: 1.0000 | SUBLINGUAL_TABLET | Freq: Every day | SUBLINGUAL | Status: DC

## 2016-05-20 MED ORDER — ENOXAPARIN SODIUM 40 MG/0.4ML ~~LOC~~ SOLN
40.0000 mg | SUBCUTANEOUS | Status: DC
  Administered 2016-05-20 – 2016-05-22 (×3): 40 mg via SUBCUTANEOUS
  Filled 2016-05-20 (×3): qty 0.4

## 2016-05-20 MED ORDER — ALBUTEROL SULFATE (2.5 MG/3ML) 0.083% IN NEBU
2.5000 mg | INHALATION_SOLUTION | Freq: Three times a day (TID) | RESPIRATORY_TRACT | Status: DC
  Administered 2016-05-20: 2.5 mg via RESPIRATORY_TRACT

## 2016-05-20 MED ORDER — VITAMIN A 10000 UNITS PO CAPS
10000.0000 [IU] | ORAL_CAPSULE | Freq: Every day | ORAL | Status: DC
Start: 2016-05-21 — End: 2016-05-23
  Administered 2016-05-21 – 2016-05-23 (×3): 10000 [IU] via ORAL
  Filled 2016-05-20 (×3): qty 1

## 2016-05-20 MED ORDER — SODIUM CHLORIDE 0.9 % IV SOLN
INTRAVENOUS | Status: DC
  Administered 2016-05-20: 23:00:00 via INTRAVENOUS

## 2016-05-20 MED ORDER — VITAMIN B-12 1000 MCG PO TABS
1000.0000 ug | ORAL_TABLET | Freq: Every day | ORAL | Status: DC
  Administered 2016-05-21 – 2016-05-23 (×3): 1000 ug via ORAL
  Filled 2016-05-20 (×3): qty 1

## 2016-05-20 MED ORDER — NITROFURANTOIN MACROCRYSTAL 50 MG PO CAPS
50.0000 mg | ORAL_CAPSULE | Freq: Every day | ORAL | Status: DC
Start: 2016-05-20 — End: 2016-05-23
  Administered 2016-05-20 – 2016-05-23 (×4): 50 mg via ORAL
  Filled 2016-05-20 (×4): qty 1

## 2016-05-20 MED ORDER — GABAPENTIN 400 MG PO CAPS
400.0000 mg | ORAL_CAPSULE | Freq: Every day | ORAL | Status: DC
  Administered 2016-05-20 – 2016-05-22 (×3): 400 mg via ORAL
  Filled 2016-05-20 (×3): qty 1

## 2016-05-20 MED ORDER — ACETAMINOPHEN 650 MG RE SUPP
650.0000 mg | Freq: Four times a day (QID) | RECTAL | Status: DC | PRN
Start: 2016-05-20 — End: 2016-05-23

## 2016-05-20 MED ORDER — METHYLPREDNISOLONE SODIUM SUCC 125 MG IJ SOLR
80.0000 mg | Freq: Three times a day (TID) | INTRAMUSCULAR | Status: DC
  Administered 2016-05-21: 80 mg via INTRAVENOUS
  Filled 2016-05-20 (×2): qty 2

## 2016-05-20 MED ORDER — ALBUTEROL SULFATE (2.5 MG/3ML) 0.083% IN NEBU
5.0000 mg | INHALATION_SOLUTION | Freq: Once | RESPIRATORY_TRACT | Status: AC
  Administered 2016-05-20: 5 mg via RESPIRATORY_TRACT
  Filled 2016-05-20: qty 6

## 2016-05-20 MED ORDER — ALBUTEROL SULFATE (2.5 MG/3ML) 0.083% IN NEBU
2.5000 mg | INHALATION_SOLUTION | Freq: Four times a day (QID) | RESPIRATORY_TRACT | Status: DC | PRN
  Filled 2016-05-20: qty 3

## 2016-05-20 MED ORDER — ALBUTEROL SULFATE (2.5 MG/3ML) 0.083% IN NEBU: 2.5000 mg | INHALATION_SOLUTION | Freq: Two times a day (BID) | RESPIRATORY_TRACT | Status: DC | PRN

## 2016-05-20 MED ORDER — MELOXICAM 15 MG PO TABS
15.0000 mg | ORAL_TABLET | Freq: Every day | ORAL | Status: DC
  Administered 2016-05-21 – 2016-05-23 (×3): 15 mg via ORAL
  Filled 2016-05-20 (×3): qty 1

## 2016-05-20 MED ORDER — SERTRALINE HCL 100 MG PO TABS
100.0000 mg | ORAL_TABLET | Freq: Every day | ORAL | Status: DC
  Administered 2016-05-21 – 2016-05-23 (×3): 100 mg via ORAL
  Filled 2016-05-20 (×3): qty 1

## 2016-05-20 MED ORDER — ACETAMINOPHEN 325 MG PO TABS
650.0000 mg | ORAL_TABLET | Freq: Four times a day (QID) | ORAL | Status: DC | PRN
  Administered 2016-05-21: 650 mg via ORAL
  Filled 2016-05-20: qty 2

## 2016-05-20 MED ORDER — LORATADINE 10 MG PO TABS
10.0000 mg | ORAL_TABLET | Freq: Every day | ORAL | Status: DC
  Administered 2016-05-21 – 2016-05-23 (×3): 10 mg via ORAL
  Filled 2016-05-20 (×3): qty 1

## 2016-05-20 MED ORDER — METHYLPREDNISOLONE SODIUM SUCC 125 MG IJ SOLR
125.0000 mg | Freq: Once | INTRAMUSCULAR | Status: AC
  Administered 2016-05-20: 125 mg via INTRAVENOUS
  Filled 2016-05-20: qty 2

## 2016-05-20 MED ORDER — DEXTROSE 5 % IV SOLN
500.0000 mg | Freq: Once | INTRAVENOUS | Status: AC
  Administered 2016-05-20: 500 mg via INTRAVENOUS
  Filled 2016-05-20: qty 500

## 2016-05-20 MED ORDER — GABAPENTIN 300 MG PO CAPS
300.0000 mg | ORAL_CAPSULE | Freq: Every day | ORAL | Status: DC
  Administered 2016-05-21 – 2016-05-23 (×3): 300 mg via ORAL
  Filled 2016-05-20 (×3): qty 1

## 2016-05-20 NOTE — ED Provider Notes (Signed)
[facility] DEPT Provider Note   CSN: [id number] Arrival date & time: 05/20/16  1532     History   Chief Complaint Chief Complaint  Patient presents with  . Shortness of Breath    HPI [name] is a 81 y.o. female.  HPI  Patient with multiple medical issues including COPD, home oxygen use, presents with dyspnea. She is here with family members corroborate history of present illness. She notes that over the past few weeks, and in particular over the past 2 days she has had progressive generalized weakness as well as worsening dyspnea. She takes all medication as directed, including multiple inhale medication for pulmonary disease. She denies fever, vomiting acknowledges nausea, anorexia. She does have some lower extremity swelling, symmetric, minimal. No confusion, no disorientation, no syncope, no chest pain.   Past Medical History:  Diagnosis Date  . Arthritis   . Bladder neoplasm   . Chronic cough   . Chronic respiratory failure ([place])   . COPD (chronic obstructive pulmonary disease) (HCC)    PULMOLOGIST-  DR [name]--  GOLD III W/  CHRONIC RESPIRATORY FAILURE--  O2 DEPENDENT  . Coronary arteriosclerosis   . Cough productive of purulent sputum   . GERD (gastroesophageal reflux disease)   . Hematuria   . History of nephritis    as child dx w/ Bright's disease (glomerulonephritis)  . Mild obstructive sleep apnea    no cpap recommendation  . Neuropathy, peripheral, idiopathic   . Pelvic pain in female   . PONV (postoperative nausea and vomiting)   . Supplemental oxygen dependent    2L  via Nasal Canula --  activity and nighttime  . Urinary incontinence in female     Patient Active Problem List   Diagnosis Date Noted  . Pneumonia 03/27/2015  . Malaise   . Emesis   . Shoulder impingement syndrome 03/25/2015  . Cough 08/15/2012  . Chronic respiratory failure ([place]) 04/10/2012  . Constipation 07/03/2011  . Nicotine dependence 07/01/2011  . COPD with  acute exacerbation ([place]) 06/29/2011  . COPD GOLD III 06/19/2007  . HYPERLIPIDEMIA 05/15/2007  . ACID REFLUX DISEASE 05/15/2007    Past Surgical History:  Procedure Laterality Date  . APPENDECTOMY  1971  . CARDIAC CATHETERIZATION  05-08-2007  dr Marlou Porch   minor coronary plaquing w/ no significant CAD/  20% mLAD,  10% mRCA,  perserved LV, ef 60-65%  . CATARACT EXTRACTION W/ INTRAOCULAR LENS  IMPLANT, BILATERAL  2001 approx  . CHOLECYSTECTOMY  1988  . CYSTOSCOPY W/ RETROGRADES Bilateral 09/24/2014   Procedure: CYSTOSCOPY WITH RETROGRADE PYELOGRAM;  Surgeon: Festus Aloe, MD;  Location: [facility];  Service: Urology;  Laterality: Bilateral;  . CYSTOSCOPY WITH BIOPSY N/A 09/24/2014   Procedure: CYSTOSCOPY WITH BIOPSY;  Surgeon: Festus Aloe, MD;  Location: [facility];  Service: Urology;  Laterality: N/A;  . ESOPHAGOGASTRODUODENOSCOPY  last one 09-09-2008  . FULGURATION OF BLADDER TUMOR N/A 09/24/2014   Procedure: FULGURATION OF BLADDER TUMOR;  Surgeon: Festus Aloe, MD;  Location: [facility];  Service: Urology;  Laterality: N/A;  . [place]  . POSTERIOR LUMBAR FUSION  10-07-2007   bilateral laminectomy L4 and bilateral L4-5 diskectomy w/ fusion  . TONSILLECTOMY  as child  . TOTAL ABDOMINAL HYSTERECTOMY W/ BILATERAL SALPINGOOPHORECTOMY  1973  . TRANSTHORACIC ECHOCARDIOGRAM  07-07-2011   normal echo,  ef 65-70%    OB History    No data available       Home Medications  Prior to Admission medications   Medication Sig Start Date End Date Taking? Authorizing Provider  acetaminophen (TYLENOL) 650 MG CR tablet Take 650 mg by mouth 3 (three) times daily.    Historical Provider, MD  albuterol (PROVENTIL HFA;VENTOLIN HFA) 108 (90 BASE) MCG/ACT inhaler Inhale 2 puffs into the lungs every 6 (six) hours as needed. For shortness of breath.    Historical Provider, MD  albuterol (PROVENTIL) (2.5 MG/3ML) 0.083% nebulizer  solution Take 2.5 mg by nebulization 2 (two) times daily as needed. For shortness of breath.    Historical Provider, MD  amoxicillin-clavulanate (AUGMENTIN) 500-125 MG tablet Take 1 tablet (500 mg total) by mouth 3 (three) times daily. 03/29/15   Jenetta Loges, PA-C  aspirin EC 81 MG tablet Take 81 mg by mouth daily.    Historical Provider, MD  atorvastatin (LIPITOR) 40 MG tablet Take 40 mg by mouth every morning.     Historical Provider, MD  azithromycin (ZITHROMAX) 500 MG tablet Take 1 tablet (500 mg total) by mouth daily. 03/30/15   Debbe Odea, MD  budesonide-formoterol (SYMBICORT) 160-4.5 MCG/ACT inhaler Inhale 2 puffs into the lungs 2 (two) times daily.    Historical Provider, MD  Calcium Carbonate-Vitamin D (CALCIUM + D PO) Take 1 tablet by mouth daily.     Historical Provider, MD  cetirizine (ZYRTEC) 10 MG tablet Take 10 mg by mouth every morning.     Historical Provider, MD  Cholecalciferol (VITAMIN D) 2000 UNITS tablet Take 2,000 Units by mouth daily.    Historical Provider, MD  Cyanocobalamin (VITAMIN B-12) 1000 MCG SUBL Place 1 tablet under the tongue daily.    Historical Provider, MD  diazepam (VALIUM) 2 MG tablet Take 1 tablet (2 mg total) by mouth every 6 (six) hours as needed for muscle spasms or sedation. 03/25/15   Olivia Mackie Shuford, PA-C  esomeprazole (NEXIUM) 40 MG capsule Take 40 mg by mouth every morning.    Historical Provider, MD  gabapentin (NEURONTIN) 100 MG capsule Take 200-300 mg by mouth 2 (two) times daily. 2 pills in am, 3 in pm    Historical Provider, MD  meloxicam (MOBIC) 15 MG tablet Take 15 mg by mouth daily. 05/14/16   Historical Provider, MD  oxyCODONE-acetaminophen (PERCOCET) 5-325 MG tablet Take 1-2 tablets by mouth every 4 (four) hours as needed. 03/25/15   Olivia Mackie Shuford, PA-C  sertraline (ZOLOFT) 100 MG tablet Take 100 mg by mouth every morning.    Historical Provider, MD  tiotropium (SPIRIVA) 18 MCG inhalation capsule Place 18 mcg into inhaler and inhale every  morning.     Historical Provider, MD  vitamin A 10000 UNIT capsule Take 10,000 Units by mouth daily.    Historical Provider, MD  vitamin E 400 UNIT capsule Take 400 Units by mouth daily.    Historical Provider, MD    Family History Family History  Problem Relation Age of Onset  . Asthma Maternal Grandmother     Social History Social History  Substance Use Topics  . Smoking status: Former Smoker    Packs/day: 3.00    Years: 60.00    Types: Cigarettes    Quit date: 06/25/2011  . Smokeless tobacco: Never Used  . Alcohol use No     Allergies   Kapidex [dexlansoprazole] and Zegerid [omeprazole]   Review of Systems Review of Systems  Constitutional:       Per HPI, otherwise negative  HENT:       Per HPI, otherwise negative  Respiratory:  Per HPI, otherwise negative  Cardiovascular:       Per HPI, otherwise negative  Gastrointestinal: Negative for vomiting.  Endocrine:       Negative aside from HPI  Genitourinary:       Neg aside from HPI   Musculoskeletal:       Per HPI, otherwise negative  Skin: Negative.   Neurological: Negative for syncope.     Physical Exam Updated Vital Signs BP 151/65 (BP Location: Right Arm)   Pulse 94   Temp 97.9 F (36.6 C) (Oral)   Resp 21   SpO2 96%   Physical Exam  Constitutional: She is oriented to person, place, and time. She appears well-developed and well-nourished. No distress.  HENT:  Head: Normocephalic and atraumatic.  Eyes: Conjunctivae and EOM are normal.  Cardiovascular: Normal rate and regular rhythm.   Pulmonary/Chest: No stridor. She has decreased breath sounds.  Abdominal: She exhibits no distension.  Musculoskeletal: She exhibits no edema.  Neurological: She is alert and oriented to person, place, and time. No cranial nerve deficit.  Skin: Skin is warm and dry.  Psychiatric: She has a normal mood and affect.  Nursing note and vitals reviewed.    ED Treatments / Results  Labs (all labs ordered are  listed, but only abnormal results are displayed) Labs Reviewed  BASIC METABOLIC PANEL - Abnormal; Notable for the following:       Result Value   Glucose, Bld 115 (*)    BUN 27 (*)    Calcium 8.4 (*)    GFR calc non Af Amer 53 (*)    All other components within normal limits  CBC - Abnormal; Notable for the following:    RBC 3.78 (*)    Hemoglobin 10.8 (*)    HCT 34.1 (*)    Platelets 403 (*)    All other components within normal limits  BRAIN NATRIURETIC PEPTIDE - Abnormal; Notable for the following:    B Natriuretic Peptide 146.3 (*)    All other components within normal limits  TROPONIN I    EKG  EKG Interpretation  Date/Time:  Sunday May 20 2016 15:41:09 EST Ventricular Rate:  96 PR Interval:    QRS Duration: 109 QT Interval:  336 QTC Calculation: 425 R Axis:   32 Text Interpretation:  narrow complex regular rhythm, w substantial artefact and wander Abnormal ekg Confirmed by Carmin Muskrat  MD [phone]) on 05/20/2016 5:03:00 PM       Radiology Dg Chest 2 View  Result Date: 05/20/2016 CLINICAL DATA:  Increasing shortness of breath and cough EXAM: CHEST  2 VIEW COMPARISON:  03/05/2016 FINDINGS: The heart size and mediastinal contours are within normal limits. Both lungs are clear. The visualized skeletal structures are unremarkable. IMPRESSION: No active cardiopulmonary disease. Electronically Signed   By: Inez Catalina M.D.   On: 05/20/2016 16:53    Procedures Procedures (including critical care time)  Medications Ordered in ED Medications  albuterol (PROVENTIL) (2.5 MG/3ML) 0.083% nebulizer solution 5 mg (5 mg Nebulization Given 05/20/16 1541)     Initial Impression / Assessment and Plan / ED Course  I have reviewed the triage vital signs and the nursing notes.  Pertinent labs & imaging results that were available during my care of the patient were reviewed by me and considered in my medical decision making (see chart for details).  After the initial  breathing treatment the patient continues to have diminished breath sounds   8:49 PM After additional albuterol, continuous nebulizer,  the patient continues to have increased work of breathing, pursed lips. She does have no other pain, is mentating clear. We discussed all findings, need for admission given the persistent dyspnea. No evidence for pneumonia, no evidence for influenza. Patient admitted for scheduled breathing treatments, steroids, COPD exacerbation.   Final Clinical Impressions(s) / ED Diagnoses   Final diagnoses:  COPD exacerbation ([place])     Carmin Muskrat, MD 05/20/16 2049

## 2016-05-20 NOTE — H&P (Signed)
TRH H&P   Patient Demographics:    [name], is a 81 y.o. female  MRN: [id number]   DOB - Mar 09, 1936  Admit Date - 05/20/2016  Outpatient Primary MD for the patient is MCNEILL,WENDY, MD  Referring MD/NP/PA:  Dr. Durel Salts  Outpatient Specialists:     Patient coming from:  home  Chief Complaint  Patient presents with  . Shortness of Breath      HPI:    [name]  is a 81 y.o. female, w Copd on home o2, apparently has had increase in dyspnea for the past 3 weeks. Slight increase in cough with yellow sputum for the past 2 days. Pt denies fever, chills, cp, palp, n/v, diarrhea. Pt presented because family wanted her to come to ED for evaluation of worsening dyspnea.   In ED, CXR negative. mildy anemic with hgb 10.8.  Pt thought to have Copd exacerbation.     Review of systems:    In addition to the HPI above,  No Fever-chills, No Headache, No changes with Vision or hearing, No problems swallowing food or Liquids, No Chest pain, No Abdominal pain, No Nausea or Vommitting, Bowel movements are regular, No Blood in stool or Urine, No dysuria, No new skin rashes or bruises, No new joints pains-aches,  No new weakness, tingling, numbness in any extremity, No recent weight gain or loss, No polyuria, polydypsia or polyphagia, No significant Mental Stressors.  A full 10 point Review of Systems was done, except as stated above, all other Review of Systems were negative.   With Past History of the following :    Past Medical History:  Diagnosis Date  . Arthritis   . Bladder neoplasm   . Chronic cough   . Chronic respiratory failure ([place])   . COPD (chronic obstructive pulmonary disease) (HCC)    PULMOLOGIST-  DR [name]--  GOLD III W/  CHRONIC RESPIRATORY FAILURE--  O2 DEPENDENT  . Coronary arteriosclerosis   . Cough productive of purulent sputum   . GERD  (gastroesophageal reflux disease)   . Hematuria   . History of nephritis    as child dx w/ Bright's disease (glomerulonephritis)  . Mild obstructive sleep apnea    no cpap recommendation  . Neuropathy, peripheral, idiopathic   . Pelvic pain in female   . PONV (postoperative nausea and vomiting)   . Supplemental oxygen dependent    2L  via Nasal Canula --  activity and nighttime  . Urinary incontinence in female       Past Surgical History:  Procedure Laterality Date  . APPENDECTOMY  1971  . CARDIAC CATHETERIZATION  05-08-2007  dr Marlou Porch   minor coronary plaquing w/ no significant CAD/  20% mLAD,  10% mRCA,  perserved LV, ef 60-65%  . CATARACT EXTRACTION W/ INTRAOCULAR LENS  IMPLANT, BILATERAL  2001 approx  . CHOLECYSTECTOMY  1988  . CYSTOSCOPY  W/ RETROGRADES Bilateral 09/24/2014   Procedure: CYSTOSCOPY WITH RETROGRADE PYELOGRAM;  Surgeon: Festus Aloe, MD;  [facility]: [facility];  Service: Urology;  Laterality: Bilateral;  . CYSTOSCOPY WITH BIOPSY N/A 09/24/2014   Procedure: CYSTOSCOPY WITH BIOPSY;  Surgeon: Festus Aloe, MD;  Location[facility];  Service: Urology;  Laterality: N/A;  . ESOPHAGOGASTRODUODENOSCOPY  last one 09-09-2008  . FULGURATION OF BLADDER TUMOR N/A 09/24/2014   Procedure: FULGURATION OF BLADDER TUMOR;  Surgeon: Festus Aloe, MD;  Location: [facility];  Service: Urology;  Laterality: N/A;  . [place]  . POSTERIOR LUMBAR FUSION  10-07-2007   bilateral laminectomy L4 and bilateral L4-5 diskectomy w/ fusion  . TONSILLECTOMY  as child  . TOTAL ABDOMINAL HYSTERECTOMY W/ BILATERAL SALPINGOOPHORECTOMY  1973  . TRANSTHORACIC ECHOCARDIOGRAM  07-07-2011   normal echo,  ef 65-70%      Social History:     Social History  Substance Use Topics  . Smoking status: Former Smoker    Packs/day: 3.00    Years: 60.00    Types: Cigarettes    Quit date: 06/25/2011  . Smokeless tobacco: Never Used  .  Alcohol use No     Lives - at home  Mobility - walks without assistance    Family History :     Family History  Problem Relation Age of Onset  . Asthma Maternal Grandmother       Home Medications:   Prior to Admission medications   Medication Sig Start Date End Date Taking? Authorizing Provider  albuterol (PROVENTIL HFA;VENTOLIN HFA) 108 (90 BASE) MCG/ACT inhaler Inhale 2 puffs into the lungs every 6 (six) hours as needed. For shortness of breath.   Yes Historical Provider, MD  albuterol (PROVENTIL) (2.5 MG/3ML) 0.083% nebulizer solution Take 2.5 mg by nebulization 2 (two) times daily as needed. For shortness of breath.   Yes Historical Provider, MD  aspirin EC 81 MG tablet Take 81 mg by mouth daily.   Yes Historical Provider, MD  atorvastatin (LIPITOR) 40 MG tablet Take 40 mg by mouth every morning.    Yes Historical Provider, MD  budesonide-formoterol (SYMBICORT) 160-4.5 MCG/ACT inhaler Inhale 2 puffs into the lungs 2 (two) times daily.   Yes Historical Provider, MD  Calcium Carbonate-Vitamin D (CALCIUM + D PO) Take 1 tablet by mouth daily.    Yes Historical Provider, MD  cetirizine (ZYRTEC) 10 MG tablet Take 10 mg by mouth every morning.    Yes Historical Provider, MD  Cholecalciferol (VITAMIN D) 2000 UNITS tablet Take 2,000 Units by mouth daily.   Yes Historical Provider, MD  Cyanocobalamin (VITAMIN B-12) 1000 MCG SUBL Place 1 tablet under the tongue daily.   Yes Historical Provider, MD  esomeprazole (NEXIUM) 40 MG capsule Take 40 mg by mouth every morning.   Yes Historical Provider, MD  gabapentin (NEURONTIN) 100 MG capsule Take 300-400 mg by mouth 2 (two) times daily. 300mg  by mouth in am, 400mg  by mouth in pm   Yes Historical Provider, MD  meloxicam (MOBIC) 15 MG tablet Take 15 mg by mouth daily. 05/14/16  Yes Historical Provider, MD  nitrofurantoin (MACRODANTIN) 50 MG capsule Take 50 mg by mouth daily. 05/07/16  Yes Historical Provider, MD  sertraline (ZOLOFT) 100 MG tablet  Take 100 mg by mouth daily.   Yes Historical Provider, MD  tiotropium (SPIRIVA) 18 MCG inhalation capsule Place 18 mcg into inhaler and inhale every morning.    Yes Historical Provider, MD  vitamin A  10000 UNIT capsule Take 10,000 Units by mouth daily.   Yes Historical Provider, MD  vitamin E 400 UNIT capsule Take 400 Units by mouth daily.   Yes Historical Provider, MD     Allergies:     Allergies  Allergen Reactions  . Kapidex [Dexlansoprazole] Other (See Comments)    Stomach pain, diarrhea  . Zegerid [Omeprazole] Other (See Comments)    Burning in chest     Physical Exam:   Vitals  Blood pressure 170/57, pulse 111, temperature 97.9 F (36.6 C), temperature source Oral, resp. rate 26, SpO2 92 %.   1. General lying in bed in NAD,    2. Normal affect and insight, Not Suicidal or Homicidal, Awake Alert, Oriented X 3.  3. No F.N deficits, ALL C.Nerves Intact, Strength 5/5 all 4 extremities, Sensation intact all 4 extremities, Plantars down going.  4. Ears and Eyes appear Normal, Conjunctivae clear, PERRLA. Moist Oral Mucosa.  5. Supple Neck, No JVD, No cervical lymphadenopathy appriciated, No Carotid Bruits.  6. Symmetrical Chest wall movement, Good air movement bilaterally, prlonged exp phase, + bil wheezing  7. RRR, No Gallops, Rubs or Murmurs, No Parasternal Heave.  8. Positive Bowel Sounds, Abdomen Soft, No tenderness, No organomegaly appriciated,No rebound -guarding or rigidity.  9.  No Cyanosis, Normal Skin Turgor, No Skin Rash or Bruise.  10. Good muscle tone,  joints appear normal , no effusions, Normal ROM.  11. No Palpable Lymph Nodes in Neck or Axillae     Data Review:    CBC  Recent Labs Lab 05/20/16 1558  WBC 8.8  HGB 10.8*  HCT 34.1*  PLT 403*  MCV 90.2  MCH 28.6  MCHC 31.7  RDW 13.4   ------------------------------------------------------------------------------------------------------------------  Chemistries   Recent Labs Lab  05/20/16 1558  NA 137  K 4.4  CL 104  CO2 25  GLUCOSE 115*  BUN 27*  CREATININE 1.00  CALCIUM 8.4*   ------------------------------------------------------------------------------------------------------------------ CrCl cannot be calculated (Unknown ideal weight.). ------------------------------------------------------------------------------------------------------------------ No results for input(s): TSH, T4TOTAL, T3FREE, THYROIDAB in the last 72 hours.  Invalid input(s): FREET3  Coagulation profile No results for input(s): INR, PROTIME in the last 168 hours. ------------------------------------------------------------------------------------------------------------------- No results for input(s): DDIMER in the last 72 hours. -------------------------------------------------------------------------------------------------------------------  Cardiac Enzymes  Recent Labs Lab 05/20/16 1558  TROPONINI <0.03   ------------------------------------------------------------------------------------------------------------------    Component Value Date/Time   BNP 146.3 (H) 05/20/2016 1558     ---------------------------------------------------------------------------------------------------------------  Urinalysis    Component Value Date/Time   COLORURINE YELLOW 03/26/2015 1714   APPEARANCEUR HAZY (A) 03/26/2015 1714   LABSPEC 1.011 03/26/2015 1714   PHURINE 6.0 03/26/2015 1714   GLUCOSEU NEGATIVE 03/26/2015 1714   HGBUR TRACE (A) 03/26/2015 1714   BILIRUBINUR NEGATIVE 03/26/2015 1714   BILIRUBINUR neg 08/17/2013 1446   KETONESUR NEGATIVE 03/26/2015 1714   PROTEINUR NEGATIVE 03/26/2015 1714   UROBILINOGEN 0.2 08/17/2013 1446   UROBILINOGEN 0.2 07/06/2011 1519   NITRITE NEGATIVE 03/26/2015 1714   LEUKOCYTESUR SMALL (A) 03/26/2015 1714    ----------------------------------------------------------------------------------------------------------------   Imaging  Results:    Dg Chest 2 View  Result Date: 05/20/2016 CLINICAL DATA:  Increasing shortness of breath and cough EXAM: CHEST  2 VIEW COMPARISON:  03/05/2016 FINDINGS: The heart size and mediastinal contours are within normal limits. Both lungs are clear. The visualized skeletal structures are unremarkable. IMPRESSION: No active cardiopulmonary disease. Electronically Signed   By: Inez Catalina M.D.   On: 05/20/2016 16:53      Assessment & Plan:  Active Problems:   COPD exacerbation (HCC)   Anemia   Hyperglycemia    1. Copd exacerbation Solumedrol 80mg  iv q8h Zithromax 500mg  iv qday Albuterol neb tid Cont dulera 2puff bid Cont albuterol hfa 2puff q6h prn Cont spiriva 1puff qday  2.  Tachycardia Check d dimer Check trop I q6hx 3 If d dimer is positive then please order CTA chest    3. Anemia Check cbc in am If hgb low then consider further w/up with ferritin, iron, tibc,b12 folate, tsh, spep, upep  4. Hyperglycemia Check hga1c If elevated please order fsbs and iss    DVT Prophylaxis Lovenox - SCDs  AM Labs Ordered, also please review Full Orders  Family Communication: Admission, patients condition and plan of care including tests being ordered have been discussed with the patient  who indicate understanding and agree with the plan and Code Status.  Code Status FULL CODE  Likely DC to  home  Condition GUARDED    Consults called: none  Admission status: observation  Time spent in minutes : 45 minutes   Jani Gravel M.D on 05/20/2016 at 9:34 PM  Between 7am to 7pm - Pager - [phone] After 7pm go to [email or web address] - password [facility]  [facility] - Office  [phone]

## 2016-05-20 NOTE — ED Notes (Signed)
Patient transported to X-ray 

## 2016-05-20 NOTE — ED Notes (Signed)
[place] at bedside.

## 2016-05-20 NOTE — ED Triage Notes (Signed)
Pt c/o cough with green mucus and tactile fevers x 2 weeks. No new bodyaches, abdominal pain, n/v/diarrhea. On Abx for UTI currently. Hx COPD and chronic respiratory failure. Expiratory and inspiratory wheezing.

## 2016-05-21 ENCOUNTER — Encounter (HOSPITAL_COMMUNITY): Payer: Self-pay

## 2016-05-21 ENCOUNTER — Observation Stay (HOSPITAL_COMMUNITY): Payer: Medicare Other

## 2016-05-21 DIAGNOSIS — K219 Gastro-esophageal reflux disease without esophagitis: Secondary | ICD-10-CM | POA: Diagnosis present

## 2016-05-21 DIAGNOSIS — R000 Tachycardia, unspecified: Secondary | ICD-10-CM | POA: Diagnosis present

## 2016-05-21 DIAGNOSIS — T380X5A Adverse effect of glucocorticoids and synthetic analogues, initial encounter: Secondary | ICD-10-CM | POA: Diagnosis present

## 2016-05-21 DIAGNOSIS — Z825 Family history of asthma and other chronic lower respiratory diseases: Secondary | ICD-10-CM | POA: Diagnosis not present

## 2016-05-21 DIAGNOSIS — Z87891 Personal history of nicotine dependence: Secondary | ICD-10-CM | POA: Diagnosis not present

## 2016-05-21 DIAGNOSIS — R739 Hyperglycemia, unspecified: Secondary | ICD-10-CM | POA: Diagnosis not present

## 2016-05-21 DIAGNOSIS — J9621 Acute and chronic respiratory failure with hypoxia: Secondary | ICD-10-CM | POA: Diagnosis present

## 2016-05-21 DIAGNOSIS — Z9981 Dependence on supplemental oxygen: Secondary | ICD-10-CM | POA: Diagnosis not present

## 2016-05-21 DIAGNOSIS — D649 Anemia, unspecified: Secondary | ICD-10-CM | POA: Diagnosis not present

## 2016-05-21 DIAGNOSIS — D72829 Elevated white blood cell count, unspecified: Secondary | ICD-10-CM | POA: Diagnosis present

## 2016-05-21 DIAGNOSIS — Z79899 Other long term (current) drug therapy: Secondary | ICD-10-CM | POA: Diagnosis not present

## 2016-05-21 DIAGNOSIS — Z9049 Acquired absence of other specified parts of digestive tract: Secondary | ICD-10-CM | POA: Diagnosis not present

## 2016-05-21 DIAGNOSIS — R531 Weakness: Secondary | ICD-10-CM | POA: Diagnosis not present

## 2016-05-21 DIAGNOSIS — J441 Chronic obstructive pulmonary disease with (acute) exacerbation: Secondary | ICD-10-CM | POA: Diagnosis not present

## 2016-05-21 DIAGNOSIS — R0602 Shortness of breath: Secondary | ICD-10-CM | POA: Diagnosis not present

## 2016-05-21 LAB — CBC
HEMATOCRIT: 33.3 % — AB (ref 36.0–46.0)
HEMOGLOBIN: 10.6 g/dL — AB (ref 12.0–15.0)
MCH: 28.4 pg (ref 26.0–34.0)
MCHC: 31.8 g/dL (ref 30.0–36.0)
MCV: 89.3 fL (ref 78.0–100.0)
Platelets: 406 10*3/uL — ABNORMAL HIGH (ref 150–400)
RBC: 3.73 MIL/uL — ABNORMAL LOW (ref 3.87–5.11)
RDW: 13.7 % (ref 11.5–15.5)
WBC: 13.5 10*3/uL — ABNORMAL HIGH (ref 4.0–10.5)

## 2016-05-21 LAB — COMPREHENSIVE METABOLIC PANEL
ALBUMIN: 3.5 g/dL (ref 3.5–5.0)
ALT: 13 U/L — ABNORMAL LOW (ref 14–54)
ANION GAP: 8 (ref 5–15)
AST: 19 U/L (ref 15–41)
Alkaline Phosphatase: 74 U/L (ref 38–126)
BUN: 20 mg/dL (ref 6–20)
CO2: 28 mmol/L (ref 22–32)
Calcium: 8.4 mg/dL — ABNORMAL LOW (ref 8.9–10.3)
Chloride: 101 mmol/L (ref 101–111)
Creatinine, Ser: 0.77 mg/dL (ref 0.44–1.00)
GFR calc Af Amer: >60 mL/min (ref >60)
GFR calc non Af Amer: >60 mL/min (ref >60)
GLUCOSE: 174 mg/dL — AB (ref 65–99)
POTASSIUM: 4.4 mmol/L (ref 3.5–5.1)
SODIUM: 137 mmol/L (ref 135–145)
Total Bilirubin: 0.4 mg/dL (ref 0.3–1.2)
Total Protein: 7 g/dL (ref 6.5–8.1)

## 2016-05-21 LAB — TROPONIN I
Troponin I: <0.03 ng/mL (ref <0.03)
Troponin I: <0.03 ng/mL (ref <0.03)

## 2016-05-21 MED ORDER — LORAZEPAM BOLUS VIA INFUSION: 0.5000 mg | Freq: Once | INTRAVENOUS | Status: DC

## 2016-05-21 MED ORDER — IPRATROPIUM-ALBUTEROL 0.5-2.5 (3) MG/3ML IN SOLN
3.0000 mL | Freq: Four times a day (QID) | RESPIRATORY_TRACT | Status: DC
  Administered 2016-05-21: 3 mL via RESPIRATORY_TRACT

## 2016-05-21 MED ORDER — IPRATROPIUM BROMIDE 0.02 % IN SOLN: 0.5000 mg | Freq: Four times a day (QID) | RESPIRATORY_TRACT | Status: DC

## 2016-05-21 MED ORDER — IPRATROPIUM-ALBUTEROL 0.5-2.5 (3) MG/3ML IN SOLN
3.0000 mL | Freq: Four times a day (QID) | RESPIRATORY_TRACT | Status: DC
  Administered 2016-05-21 – 2016-05-23 (×7): 3 mL via RESPIRATORY_TRACT
  Filled 2016-05-21 (×7): qty 3

## 2016-05-21 MED ORDER — IOPAMIDOL (ISOVUE-370) INJECTION 76%
INTRAVENOUS | Status: AC
  Administered 2016-05-21: 100 mL via INTRAVENOUS
  Filled 2016-05-21: qty 100

## 2016-05-21 MED ORDER — IOPAMIDOL (ISOVUE-370) INJECTION 76%
100.0000 mL | Freq: Once | INTRAVENOUS | Status: AC | PRN
  Administered 2016-05-21: 100 mL via INTRAVENOUS

## 2016-05-21 MED ORDER — SODIUM CHLORIDE 0.9 % IJ SOLN
INTRAMUSCULAR | Status: AC
  Administered 2016-05-21: 01:00:00
  Filled 2016-05-21: qty 50

## 2016-05-21 MED ORDER — LORAZEPAM 2 MG/ML IJ SOLN
0.5000 mg | Freq: Once | INTRAMUSCULAR | Status: AC
  Administered 2016-05-21: 0.5 mg via INTRAVENOUS
  Filled 2016-05-21: qty 1

## 2016-05-21 MED ORDER — METHYLPREDNISOLONE SODIUM SUCC 125 MG IJ SOLR
60.0000 mg | Freq: Two times a day (BID) | INTRAMUSCULAR | Status: DC
  Administered 2016-05-21: 60 mg via INTRAVENOUS
  Filled 2016-05-21: qty 2

## 2016-05-21 MED ORDER — ALBUTEROL SULFATE (2.5 MG/3ML) 0.083% IN NEBU: 2.5000 mg | INHALATION_SOLUTION | RESPIRATORY_TRACT | Status: DC | PRN

## 2016-05-21 NOTE — Progress Notes (Signed)
This shift pts d-dimer 1.32. Pt received 0.5 mg and transported to CT. Tolerated procedure well, returned to room with no complications.

## 2016-05-21 NOTE — Progress Notes (Signed)
Pt arrived to unit room 1503 via stretcher.  Alert and oriented x 4, unsteady gait to bed. 0/10  Pain., general weakness. VS taken  Pt oriented  To room and callbell with no complications. Initial assessment completed, will continue to monitor

## 2016-05-21 NOTE — Progress Notes (Signed)
Pt complaining of  6/10 sharp bladder pain. Pt states that tylenol dulls the pain but she continues to have pain. Mattheus Rauls W Hever Castilleja, RN

## 2016-05-21 NOTE — Care Management Obs Status (Signed)
[facility] NOTIFICATION   Patient Details  Name: [name] MRN: [id number] Date of Birth: 06/05/1935   Medicare Observation Status Notification Given:  Yes    Guadalupe Maple, RN 05/21/2016, 4:07 PM

## 2016-05-21 NOTE — Progress Notes (Addendum)
[place] TEAM 1 - Stepdown/ICU TEAM  [name]  [id number] DOB: [birth year] DOA: 05/20/2016 PCP: [name], MD    Brief Narrative:  81 y.o. female w/ a hx of  COPD on home O2 c/o increasing dyspnea for 3 weeks w/ cough producing yellow sputum for 2 days.  In the ED a CXR negative for acute infiltrate.  Subjective: The patient states she feels somewhat less short of breath but is not yet back to her baseline.  She reports intermittent chest tightness coincident with episodes of wheezing but denies substernal chest pressure.  She denies nausea vomiting or abdominal pain and reports a modest appetite.  Assessment & Plan:  COPD w/ acute bronchospastic exacerbation and hypoxic respiratory failure  Continue usual medical management and follow - appears to be slowly improving already - we will need to assure she can ambulate without significantly exacerbating her symptoms prior to discharge  Tachycardia  No PE on CTa chest - likely physiologic related to respiratory distress - improving  Hyperglycemia  A1c pending - likely due to steroid dosing  GERD Appears well managed at this time  Normocytic anemia Check Fe studies and stool guiac  DVT prophylaxis: SCDs Code Status: FULL CODE Family Communication: Spoke with patient and sister at bedside  Disposition Plan: Anticipate an additional 48 hours plus of hospitalization  Consultants:  None  Procedures: None  Antimicrobials:  Azithromycin 1/28 >   Objective: Blood pressure 135/63, pulse (!) 104, temperature 98.1 F (36.7 C), temperature source Oral, resp. rate 18, height 5' (1.524 m), weight 83.5 kg (184 lb), SpO2 95 %.  Intake/Output Summary (Last 24 hours) at 05/21/16 1437 Last data filed at 05/21/16 0300  Gross per 24 hour  Intake              300 ml  Output                0 ml  Net              300 ml   Filed Weights   05/21/16 0700  Weight: 83.5 kg (184 lb)    Examination: General: Mild tachypnea at  rest but able to carry on a conversation Lungs: Poor air movement throughout all fields with mild expiratory wheezing but no prolonged expiratory phase Cardiovascular: Regular rate and rhythm without murmur gallop or rub normal S1 and S2 Abdomen: Nontender, overweight, soft, bowel sounds positive, no rebound, no ascites, no appreciable mass Extremities: No significant cyanosis, clubbing, or edema bilateral lower extremities  CBC:  Recent Labs Lab 05/20/16 1558 05/21/16 0506  WBC 8.8 13.5*  HGB 10.8* 10.6*  HCT 34.1* 33.3*  MCV 90.2 89.3  PLT 403* [id number]*   Basic Metabolic Panel:  Recent Labs Lab 05/20/16 1558 05/21/16 0506  NA 137 137  K 4.4 4.4  CL 104 101  CO2 25 28  GLUCOSE 115* 174*  BUN 27* 20  CREATININE 1.00 0.77  CALCIUM 8.4* 8.4*   GFR: Estimated Creatinine Clearance: 55.5 mL/min (by C-G formula based on SCr of 0.77 mg/dL).  Liver Function Tests:  Recent Labs Lab 05/21/16 0506  AST 19  ALT 13*  ALKPHOS 74  BILITOT 0.4  PROT 7.0  ALBUMIN 3.5    Cardiac Enzymes:  Recent Labs Lab 05/20/16 1558 05/20/16 2250 05/21/16 0506 05/21/16 1059  TROPONINI <0.03 <0.03 <0.03 <0.03    HbA1C: No results found for: HGBA1C  CBG: No results for input(s): GLUCAP in the last 168 hours.   Scheduled Meds: .  albuterol  2.5 mg Nebulization QID  . aspirin EC  81 mg Oral Daily  . atorvastatin  40 mg Oral q morning - 10a  . azithromycin  500 mg Intravenous Q24H  . cholecalciferol  2,000 Units Oral Daily  . enoxaparin (LOVENOX) injection  40 mg Subcutaneous Q24H  . famotidine  10 mg Oral BID  . gabapentin  300 mg Oral Daily  . gabapentin  400 mg Oral QHS  . loratadine  10 mg Oral Daily  . meloxicam  15 mg Oral Daily  . methylPREDNISolone (SOLU-MEDROL) injection  80 mg Intravenous Q8H  . mometasone-formoterol  2 puff Inhalation BID  . nitrofurantoin  50 mg Oral Daily  . sertraline  100 mg Oral Daily  . sodium chloride flush  3 mL Intravenous Q12H  .  tiotropium  18 mcg Inhalation q morning - 10a  . vitamin A  10,000 Units Oral Daily  . vitamin B-12  1,000 mcg Oral Daily  . vitamin E  400 Units Oral Daily     LOS: 0 days   Cherene Altes, MD [facility] Office  [phone] Pager - Text Page per Amion as per below:  On-Call/Text Page:      [email or web address]      password [email or web address]  If 7PM-7AM, please contact night-coverage [email or web address] Password [email or web address] 05/21/2016, 2:37 PM

## 2016-05-22 DIAGNOSIS — J441 Chronic obstructive pulmonary disease with (acute) exacerbation: Principal | ICD-10-CM

## 2016-05-22 LAB — CBC
HCT: 34 % — ABNORMAL LOW (ref 36.0–46.0)
HEMOGLOBIN: 10.8 g/dL — AB (ref 12.0–15.0)
MCH: 28.1 pg (ref 26.0–34.0)
MCHC: 31.8 g/dL (ref 30.0–36.0)
MCV: 88.3 fL (ref 78.0–100.0)
Platelets: 406 10*3/uL — ABNORMAL HIGH (ref 150–400)
RBC: 3.85 MIL/uL — AB (ref 3.87–5.11)
RDW: 13.7 % (ref 11.5–15.5)
WBC: 17 10*3/uL — AB (ref 4.0–10.5)

## 2016-05-22 LAB — BASIC METABOLIC PANEL
ANION GAP: 8 (ref 5–15)
BUN: 19 mg/dL (ref 6–20)
CHLORIDE: 102 mmol/L (ref 101–111)
CO2: 29 mmol/L (ref 22–32)
Calcium: 8.4 mg/dL — ABNORMAL LOW (ref 8.9–10.3)
Creatinine, Ser: 0.75 mg/dL (ref 0.44–1.00)
GFR calc Af Amer: >60 mL/min (ref >60)
GFR calc non Af Amer: >60 mL/min (ref >60)
GLUCOSE: 165 mg/dL — AB (ref 65–99)
Potassium: 4.2 mmol/L (ref 3.5–5.1)
Sodium: 139 mmol/L (ref 135–145)

## 2016-05-22 LAB — INFLUENZA PANEL BY PCR (TYPE A & B)
Influenza A By PCR: NEGATIVE
Influenza B By PCR: NEGATIVE

## 2016-05-22 LAB — VITAMIN B12: Vitamin B-12: 1042 pg/mL — ABNORMAL HIGH (ref 180–914)

## 2016-05-22 LAB — IRON AND TIBC
IRON: 80 ug/dL (ref 28–170)
SATURATION RATIOS: 24 % (ref 10.4–31.8)
TIBC: 340 ug/dL (ref 250–450)
UIBC: 260 ug/dL

## 2016-05-22 LAB — HEMOGLOBIN A1C
HEMOGLOBIN A1C: 6.3 % — AB (ref 4.8–5.6)
Mean Plasma Glucose: 134 mg/dL

## 2016-05-22 LAB — RETICULOCYTES
RBC.: 3.85 MIL/uL — ABNORMAL LOW (ref 3.87–5.11)
Retic Count, Absolute: 61.6 10*3/uL (ref 19.0–186.0)
Retic Ct Pct: 1.6 % (ref 0.4–3.1)

## 2016-05-22 LAB — EXPECTORATED SPUTUM ASSESSMENT W GRAM STAIN, RFLX TO RESP C

## 2016-05-22 LAB — EXPECTORATED SPUTUM ASSESSMENT W REFEX TO RESP CULTURE: SPECIAL REQUESTS: NORMAL

## 2016-05-22 LAB — FOLATE: Folate: 7.8 ng/mL (ref >5.9)

## 2016-05-22 LAB — FERRITIN: Ferritin: 75 ng/mL (ref 11–307)

## 2016-05-22 MED ORDER — METHYLPREDNISOLONE SODIUM SUCC 125 MG IJ SOLR
60.0000 mg | Freq: Three times a day (TID) | INTRAMUSCULAR | Status: DC
  Administered 2016-05-22 – 2016-05-23 (×3): 60 mg via INTRAVENOUS
  Filled 2016-05-22 (×3): qty 2

## 2016-05-22 MED ORDER — OSELTAMIVIR PHOSPHATE 30 MG PO CAPS
30.0000 mg | ORAL_CAPSULE | Freq: Two times a day (BID) | ORAL | Status: DC
Start: 2016-05-22 — End: 2016-05-23
  Administered 2016-05-22 (×2): 30 mg via ORAL
  Filled 2016-05-22 (×3): qty 1

## 2016-05-22 NOTE — Progress Notes (Addendum)
[name]  [id number] DOB: [birth year] DOA: 05/20/2016 PCP: [name], MD    Brief Narrative:  81 y.o. female w/ a hx of  COPD on home O2 c/o increasing dyspnea for 3 weeks w/ cough producing yellow sputum for 2 days.  In the ED a CXR negative for acute infiltrate.  Subjective: Patient still not at baseline, persistent cough. Having chills.  Slowly answering questions.   Assessment & Plan:  COPD w/ acute bronchospastic exacerbation and hypoxic respiratory failure  Will continue with IV solumedrol, patient with persistent wheezing.  Will check for influenza, start tamiflu. Sick contact., granddaughter had the flu.  Continue with azithromycin.  Continue with nebulizer.   Tachycardia  No PE on CTa chest - likely physiologic related to respiratory distress - stable.   Leukocytosis; suspect related to steroids. On IV antibiotics.   Hyperglycemia  A1c pending - likely due to steroid dosing  GERD Appears well managed at this time  Normocytic anemia Iron study normal.  stool guaiac pending.   DVT prophylaxis: SCDs Code Status: FULL CODE Family Communication: Spoke with patient Disposition Plan: Anticipate an additional 24 hour depending on respiratory status.   Consultants:  None  Procedures: None  Antimicrobials:  Azithromycin 1/28 >   Objective: Blood pressure (!) 153/80, pulse (!) 105, temperature 98.2 F (36.8 C), temperature source Oral, resp. rate 20, height 5' (1.524 m), weight 83.5 kg (184 lb), SpO2 (!) 85 %.  Intake/Output Summary (Last 24 hours) at 05/22/16 0942 Last data filed at 05/22/16 0333  Gross per 24 hour  Intake              490 ml  Output                0 ml  Net              490 ml   Filed Weights   05/21/16 0700  Weight: 83.5 kg (184 lb)    Examination: General: Mild tachypnea , chills.  Lungs: Bilateral; wheezing and ronchus Cardiovascular: Regular rate and rhythm without murmur gallop or rub normal S1 and S2 Abdomen:  Nontender, overweight, soft, bowel sounds positive, no rebound, no ascites, no appreciable mass Extremities: No significant cyanosis, clubbing, or edema bilateral lower extremities  CBC:  Recent Labs Lab 05/20/16 1558 05/21/16 0506 05/22/16 0539  WBC 8.8 13.5* 17.0*  HGB 10.8* 10.6* 10.8*  HCT 34.1* 33.3* 34.0*  MCV 90.2 89.3 88.3  PLT 403* 406* [id number]*   Basic Metabolic Panel:  Recent Labs Lab 05/20/16 1558 05/21/16 0506 05/22/16 0539  NA 137 137 139  K 4.4 4.4 4.2  CL 104 101 102  CO2 25 28 29   GLUCOSE 115* 174* 165*  BUN 27* 20 19  CREATININE 1.00 0.77 0.75  CALCIUM 8.4* 8.4* 8.4*   GFR: Estimated Creatinine Clearance: 55.5 mL/min (by C-G formula based on SCr of 0.75 mg/dL).  Liver Function Tests:  Recent Labs Lab 05/21/16 0506  AST 19  ALT 13*  ALKPHOS 74  BILITOT 0.4  PROT 7.0  ALBUMIN 3.5    Cardiac Enzymes:  Recent Labs Lab 05/20/16 1558 05/20/16 2250 05/21/16 0506 05/21/16 1059  TROPONINI <0.03 <0.03 <0.03 <0.03    HbA1C: Hgb A1c MFr Bld  Date/Time Value Ref Range Status  05/21/2016 05:06 AM 6.3 (H) 4.8 - 5.6 % Final    Comment:    (NOTE)         Pre-diabetes: 5.7 - 6.4  Diabetes: >6.4         Glycemic control for adults with diabetes: <7.0     CBG: No results for input(s): GLUCAP in the last 168 hours.   Scheduled Meds: . aspirin EC  81 mg Oral Daily  . atorvastatin  40 mg Oral q morning - 10a  . azithromycin  500 mg Intravenous Q24H  . cholecalciferol  2,000 Units Oral Daily  . enoxaparin (LOVENOX) injection  40 mg Subcutaneous Q24H  . famotidine  10 mg Oral BID  . gabapentin  300 mg Oral Daily  . gabapentin  400 mg Oral QHS  . ipratropium-albuterol  3 mL Nebulization QID  . loratadine  10 mg Oral Daily  . meloxicam  15 mg Oral Daily  . methylPREDNISolone (SOLU-MEDROL) injection  60 mg Intravenous Q12H  . nitrofurantoin  50 mg Oral Daily  . sertraline  100 mg Oral Daily  . sodium chloride flush  3 mL Intravenous  Q12H  . vitamin A  10,000 Units Oral Daily  . vitamin B-12  1,000 mcg Oral Daily  . vitamin E  400 Units Oral Daily     LOS: 1 day    Niel Hummer, MD.  [phone]  If 7PM-7AM, please contact night-coverage [email or web address] Password [email or web address] 05/22/2016, 9:42 AM

## 2016-05-22 NOTE — Evaluation (Signed)
Physical Therapy Evaluation Patient Details Name: [name] MRN: [id number] DOB: 20-May-1935 Today's Date: 05/22/2016   History of Present Illness  81 yo female admitted with COPD exac. Hx of COPD-O2 dep, R shoulder repair 2016  Clinical Impression  On eval, pt required Min guard assist for mobility. She walked ~75 feet with a RW. O2 sats 94% 2L O2 at rest, 87% on 2L O2 during ambulation. Discussed d/c plan-pt stated she will return home. She politely declined HHPT follow up. Recommend daily ambulation with nursing supervision. Will follow and progress activity as tolerated.     Follow Up Recommendations No PT follow up (pt politely declined)    Equipment Recommendations  None recommended by PT    Recommendations for Other Services       Precautions / Restrictions Precautions Precautions: Fall Precaution Comments: droplet. O2 dep Restrictions Weight Bearing Restrictions: No      Mobility  Bed Mobility Overal bed mobility: Modified Independent                Transfers Overall transfer level: Modified independent                  Ambulation/Gait Ambulation/Gait assistance: Min guard Ambulation Distance (Feet): 75 Feet Assistive device: Rolling walker (2 wheeled) Gait Pattern/deviations: Step-through pattern;Decreased stride length     General Gait Details: O2 sats 87% 2 L O2, dyspnea 2/4. Pt was fatigued after walk.   Stairs            Wheelchair Mobility    Modified Rankin (Stroke Patients Only)       Balance                                             Pertinent Vitals/Pain Pain Assessment: No/denies pain    Home Living Family/patient expects to be discharged to:: Private residence Living Arrangements: Other relatives Available Help at Discharge: Family Type of Home: House Home Access: Stairs to enter   Technical brewer of Steps: 2 Home Layout: Two level;Bed/bath upstairs (stair lift) Home Equipment:  Environmental consultant - 4 wheels      Prior Function Level of Independence: Independent with assistive device(s)               Hand Dominance        Extremity/Trunk Assessment   Upper Extremity Assessment Upper Extremity Assessment: Overall WFL for tasks assessed    Lower Extremity Assessment Lower Extremity Assessment: Generalized weakness    Cervical / Trunk Assessment Cervical / Trunk Assessment: Normal  Communication   Communication: No difficulties  Cognition Arousal/Alertness: Awake/alert Behavior During Therapy: WFL for tasks assessed/performed Overall Cognitive Status: Within Functional Limits for tasks assessed                      General Comments      Exercises     Assessment/Plan    PT Assessment Patient needs continued PT services  PT Problem List Decreased strength;Decreased mobility;Decreased activity tolerance;Decreased balance;Decreased knowledge of use of DME;Cardiopulmonary status limiting activity          PT Treatment Interventions DME instruction;Gait training;Therapeutic activities;Therapeutic exercise;Patient/family education;Functional mobility training;Balance training    PT Goals (Current goals can be found in the Care Plan section)  Acute Rehab PT Goals Patient Stated Goal: home soon PT Goal Formulation: With patient Time For Goal Achievement: 06/05/16 Potential to  Achieve Goals: Good    Frequency Min 3X/week   Barriers to discharge        Co-evaluation               End of Session Equipment Utilized During Treatment: Gait belt;Oxygen Activity Tolerance: Patient limited by fatigue Patient left: in chair;with call bell/phone within reach           Time: 1022-1040 PT Time Calculation (min) (ACUTE ONLY): 18 min   Charges:   PT Evaluation $PT Eval Low Complexity: 1 Procedure     PT G Codes:        Weston Anna, MPT Pager: [phone]

## 2016-05-23 DIAGNOSIS — D649 Anemia, unspecified: Secondary | ICD-10-CM

## 2016-05-23 MED ORDER — PREDNISONE 20 MG PO TABS: ORAL_TABLET | ORAL | 0 refills | Status: DC

## 2016-05-23 MED ORDER — AZITHROMYCIN 500 MG PO TABS: 500.0000 mg | ORAL_TABLET | Freq: Every day | ORAL | 0 refills | Status: DC

## 2016-05-23 NOTE — Progress Notes (Signed)
DC instructions reviewed with patient at bedside. Prescriptions provided. Patient with no further questions. This RN transported patient by wheelchair to meet her sister, who will be taking her home.

## 2016-05-23 NOTE — Discharge Summary (Addendum)
Physician Discharge Summary  [name] [id number] DOB: 28-Apr-1935 DOA: 05/20/2016  PCP: [name], MD  Admit date: 05/20/2016 Discharge date: 05/23/2016  Admitted From: Home  Disposition: Home   Recommendations for Outpatient Follow-up:  1. Follow up with PCP in 1-2 weeks, needs repeat HB-A1c. Or cbg.  2. Please obtain BMP/CBC in one week 3. Please follow up on the following pending results:  Discharge Condition: stable.  CODE STATUS: Full code.  Diet recommendation: Heart Healthy   Brief/Interim Summary: Brief Narrative:  81 y.o.female w/ a hx of  COPD on home O2 c/o increasing dyspnea for 3 weeks w/ cough producing yellow sputum for 2 days.  In the ED a CXR negative for acute infiltrate.   Assessment & Plan:  COPD w/ acute bronchospastic exacerbation and Acute on chronic  hypoxic respiratory failure  Treated with IV solumedrol, transition to prednisone.  Influenza negative,  Continue with azithromycin.  Continue with nebulizer.   Tachycardia  No PE on CTa chest - likely physiologic related to respiratory distress - stable.   Leukocytosis; suspect related to steroids. On IV antibiotics.   Hyperglycemia  A1c 6.3- likely due to steroid dosing mildly elevated.   GERD Appears well managed at this time  Normocytic anemia Iron study normal.  stool guaiac pending.    Discharge Diagnoses:  Active Problems:   COPD exacerbation ([place])   Anemia   Hyperglycemia    Discharge Instructions  Discharge Instructions    Diet - low sodium heart healthy    Complete by:  As directed    Increase activity slowly    Complete by:  As directed      Allergies as of 05/23/2016      Reactions   Kapidex [dexlansoprazole] Other (See Comments)   Stomach pain, diarrhea   Zegerid [omeprazole] Other (See Comments)   Burning in chest      Medication List    TAKE these medications   albuterol 108 (90 Base) MCG/ACT inhaler Commonly known as:  PROVENTIL  HFA;VENTOLIN HFA Inhale 2 puffs into the lungs every 6 (six) hours as needed. For shortness of breath.   albuterol (2.5 MG/3ML) 0.083% nebulizer solution Commonly known as:  PROVENTIL Take 2.5 mg by nebulization 2 (two) times daily as needed. For shortness of breath.   aspirin EC 81 MG tablet Take 81 mg by mouth daily.   atorvastatin 40 MG tablet Commonly known as:  LIPITOR Take 40 mg by mouth every morning.   azithromycin 500 MG tablet Commonly known as:  ZITHROMAX Take 1 tablet (500 mg total) by mouth daily.   budesonide-formoterol 160-4.5 MCG/ACT inhaler Commonly known as:  SYMBICORT Inhale 2 puffs into the lungs 2 (two) times daily.   CALCIUM + D PO Take 1 tablet by mouth daily.   cetirizine 10 MG tablet Commonly known as:  ZYRTEC Take 10 mg by mouth every morning.   esomeprazole 40 MG capsule Commonly known as:  NEXIUM Take 40 mg by mouth every morning.   gabapentin 100 MG capsule Commonly known as:  NEURONTIN Take 300-400 mg by mouth 2 (two) times daily. 300mg  by mouth in am, 400mg  by mouth in pm   meloxicam 15 MG tablet Commonly known as:  MOBIC Take 15 mg by mouth daily.   nitrofurantoin 50 MG capsule Commonly known as:  MACRODANTIN Take 50 mg by mouth daily.   predniSONE 20 MG tablet Commonly known as:  DELTASONE Take 3 tablets by mouth daily for 3 days then take 2 tablets daily  for 4 days.   sertraline 100 MG tablet Commonly known as:  ZOLOFT Take 100 mg by mouth daily.   tiotropium 18 MCG inhalation capsule Commonly known as:  SPIRIVA Place 18 mcg into inhaler and inhale every morning.   vitamin A 10000 UNIT capsule Take 10,000 Units by mouth daily.   Vitamin B-12 1000 MCG Subl Place 1 tablet under the tongue daily.   Vitamin D 2000 units tablet Take 2,000 Units by mouth daily.   vitamin E 400 UNIT capsule Take 400 Units by mouth daily.       Allergies  Allergen Reactions  . Kapidex [Dexlansoprazole] Other (See Comments)    Stomach  pain, diarrhea  . Zegerid [Omeprazole] Other (See Comments)    Burning in chest    Consultations:  none   Procedures/Studies: Dg Chest 2 View  Result Date: 05/20/2016 CLINICAL DATA:  Increasing shortness of breath and cough EXAM: CHEST  2 VIEW COMPARISON:  03/05/2016 FINDINGS: The heart size and mediastinal contours are within normal limits. Both lungs are clear. The visualized skeletal structures are unremarkable. IMPRESSION: No active cardiopulmonary disease. Electronically Signed   By: Inez Catalina M.D.   On: 05/20/2016 16:53   Ct Angio Chest Pe W Or Wo Contrast  Result Date: 05/21/2016 CLINICAL DATA:  Weakness and dyspnea. Lower extremity swelling. Elevated D-dimer. EXAM: CT ANGIOGRAPHY CHEST WITH CONTRAST TECHNIQUE: Multidetector CT imaging of the chest was performed using the standard protocol during bolus administration of intravenous contrast. Multiplanar CT image reconstructions and MIPs were obtained to evaluate the vascular anatomy. CONTRAST:  100 cc Isovue 370 IV. COMPARISON:  Chest radiographs 9 hours prior. FINDINGS: Cardiovascular: No filling defects in the central pulmonary arteries to the proximal segmental level, distal branches particularly in the lower lobes cannot be assessed due to breathing motion artifact. Atherosclerosis and tortuosity of the thoracic aorta without dissection or acute abnormality. Scattered coronary artery calcifications. The heart is normal in size. Mediastinum/Nodes: No mediastinal, hilar, or axillary adenopathy. Visualized thyroid gland is normal. The esophagus is decompressed. Lungs/Pleura: Motion artifact. Minimal dependent atelectasis. Mild emphysema. No confluent pneumonia. No dominant pulmonary mass. No pleural fluid. Upper Abdomen: No acute abnormality allowing for motion. Musculoskeletal: There are no acute or suspicious osseous abnormalities. Degenerative change throughout spine. Review of the MIP images confirms the above findings. IMPRESSION: 1.  No central pulmonary embolus.  Motion limited exam. 2. Atherosclerosis including coronary artery calcifications. 3. Mild emphysema.  Mild dependent atelectasis. Electronically Signed   By: Jeb Levering M.D.   On: 05/21/2016 02:26       Subjective: Feeling better, dyspnea improved. Feels could go home.   Discharge Exam: Vitals:   05/22/16 2100 05/23/16 0500  BP: (!) 149/74 131/70  Pulse: 90 100  Resp: 18   Temp: 98.4 F (36.9 C) 98 F (36.7 C)   Vitals:   05/22/16 1521 05/22/16 2100 05/23/16 0500 05/23/16 0814  BP:  (!) 149/74 131/70   Pulse: 94 90 100   Resp: 18 18    Temp:  98.4 F (36.9 C) 98 F (36.7 C)   TempSrc:  Oral Oral   SpO2: 94% 94% 96% 96%  Weight:      Height:        General: Pt is alert, awake, not in acute distress Cardiovascular: RRR, S1/S2 +, no rubs, no gallops Respiratory: CTA bilaterally, no wheezing, no rhonchi Abdominal: Soft, NT, ND, bowel sounds + Extremities: no edema, no cyanosis    The results of significant diagnostics  from this hospitalization (including imaging, microbiology, ancillary and laboratory) are listed below for reference.     Microbiology: Recent Results (from the past 240 hour(s))  Culture, expectorated sputum-assessment     Status: None   Collection Time: 05/22/16  5:44 AM  Result Value Ref Range Status   Specimen Description SPUTUM  Final   Special Requests Normal  Final   Sputum evaluation THIS SPECIMEN IS ACCEPTABLE FOR SPUTUM CULTURE  Final   Report Status 05/22/2016 FINAL  Final  Culture, respiratory (NON-Expectorated)     Status: None (Preliminary result)   Collection Time: 05/22/16  5:44 AM  Result Value Ref Range Status   Specimen Description SPUTUM  Final   Special Requests Normal Reflexed from [id number]  Final   Gram Stain   Final    ABUNDANT WBC PRESENT,BOTH PMN AND MONONUCLEAR ABUNDANT GRAM POSITIVE COCCI IN PAIRS FEW GRAM NEGATIVE COCCI IN PAIRS RARE GRAM NEGATIVE RODS Performed at [facility], [place] [address] [place], [place] [address]    Culture PENDING  Incomplete   Report Status PENDING  Incomplete     Labs: BNP (last 3 results)  Recent Labs  05/20/16 1558  BNP [id number]   Basic Metabolic Panel:  Recent Labs Lab 05/20/16 1558 05/21/16 0506 05/22/16 0539  NA 137 137 139  K 4.4 4.4 4.2  CL 104 101 102  CO2 25 28 29   GLUCOSE 115* 174* 165*  BUN 27* 20 19  CREATININE 1.00 0.77 0.75  CALCIUM 8.4* 8.4* 8.4*   Liver Function Tests:  Recent Labs Lab 05/21/16 0506  AST 19  ALT 13*  ALKPHOS 74  BILITOT 0.4  PROT 7.0  ALBUMIN 3.5   No results for input(s): LIPASE, AMYLASE in the last 168 hours. No results for input(s): AMMONIA in the last 168 hours. CBC:  Recent Labs Lab 05/20/16 1558 05/21/16 0506 05/22/16 0539  WBC 8.8 13.5* 17.0*  HGB 10.8* 10.6* 10.8*  HCT 34.1* 33.3* 34.0*  MCV 90.2 89.3 88.3  PLT 403* 406* 406*   Cardiac Enzymes:  Recent Labs Lab 05/20/16 1558 05/20/16 2250 05/21/16 0506 05/21/16 1059  TROPONINI <0.03 <0.03 <0.03 <0.03   BNP: Invalid input(s): POCBNP CBG: No results for input(s): GLUCAP in the last 168 hours. D-Dimer  Recent Labs  05/20/16 2250  DDIMER 1.32*   Hgb A1c  Recent Labs  05/21/16 0506  HGBA1C 6.3*   Lipid Profile No results for input(s): CHOL, HDL, LDLCALC, TRIG, CHOLHDL, LDLDIRECT in the last 72 hours. Thyroid function studies No results for input(s): TSH, T4TOTAL, T3FREE, THYROIDAB in the last 72 hours.  Invalid input(s): FREET3 Anemia work up  Recent Labs  05/22/16 0539  VITAMINB12 1,042*  FOLATE 7.8  FERRITIN 75  TIBC 340  IRON 80  RETICCTPCT 1.6   Urinalysis    Component Value Date/Time   COLORURINE YELLOW 03/26/2015 1714   APPEARANCEUR HAZY (A) 03/26/2015 1714   LABSPEC 1.011 03/26/2015 1714   PHURINE 6.0 03/26/2015 1714   GLUCOSEU NEGATIVE 03/26/2015 1714   HGBUR TRACE (A) 03/26/2015 1714   BILIRUBINUR NEGATIVE 03/26/2015 1714   BILIRUBINUR neg 08/17/2013  1446   KETONESUR NEGATIVE 03/26/2015 1714   PROTEINUR NEGATIVE 03/26/2015 1714   UROBILINOGEN 0.2 08/17/2013 1446   UROBILINOGEN 0.2 07/06/2011 1519   NITRITE NEGATIVE 03/26/2015 1714   LEUKOCYTESUR SMALL (A) 03/26/2015 1714   Sepsis Labs Invalid input(s): PROCALCITONIN,  WBC,  LACTICIDVEN Microbiology Recent Results (from the past 240 hour(s))  Culture, expectorated sputum-assessment  Status: None   Collection Time: 05/22/16  5:44 AM  Result Value Ref Range Status   Specimen Description SPUTUM  Final   Special Requests Normal  Final   Sputum evaluation THIS SPECIMEN IS ACCEPTABLE FOR SPUTUM CULTURE  Final   Report Status 05/22/2016 FINAL  Final  Culture, respiratory (NON-Expectorated)     Status: None (Preliminary result)   Collection Time: 05/22/16  5:44 AM  Result Value Ref Range Status   Specimen Description SPUTUM  Final   Special Requests Normal Reflexed from [id number]  Final   Gram Stain   Final    ABUNDANT WBC PRESENT,BOTH PMN AND MONONUCLEAR ABUNDANT GRAM POSITIVE COCCI IN PAIRS FEW GRAM NEGATIVE COCCI IN PAIRS RARE GRAM NEGATIVE RODS Performed at [facility], [place] [address] [place], [place] [address]    Culture PENDING  Incomplete   Report Status PENDING  Incomplete     Time coordinating discharge: Over 30 minutes  SIGNED:   Elmarie Shiley, MD  [facility] 05/23/2016, 10:15 AM Pager [phone]  If 7PM-7AM, please contact night-coverage [email or web address] Password [email or web address]

## 2016-05-23 NOTE — Consult Note (Signed)
   [facility] CM Inpatient Consult   05/23/2016  [place] [birth year] [id number]    Spoke with Ms. Hepler at bedside about receiving EMMI COPD automated telephone calls post discharge. She is agreeable to this. Denies any other [facility] Care Management needs. Will make inpatient RNCM aware.    Marthenia Rolling, MSN-Ed, RN,BSN [facility] Liaison [phone]

## 2016-05-24 LAB — CULTURE, RESPIRATORY: CULTURE: NORMAL

## 2016-05-24 LAB — CULTURE, RESPIRATORY W GRAM STAIN: Special Requests: NORMAL

## 2016-05-25 ENCOUNTER — Other Ambulatory Visit: Payer: Self-pay

## 2016-05-25 NOTE — Patient Outreach (Addendum)
[facility]) Care Management  05/25/2016  [name] 1935/12/22 [id number]   Telephonic Emmi COPD, Screening, Transition of Care and Initial Assessment     Referral Date:  05/25/2016 Issue:  Red Alert:  appt scheduled?  No Insurance:  AARP Medicare   Subjective: Outreach call #1 to patient; reached and completed call.  [place] [id number] [phone] (H)  Providers: Primary MD:  Dr. [name] -  last appt: 05/21/2016  Pulmonologist:  Dr. [name] - last appt 02/11/2013  Psycho/Social: Patient lives in her home with (undisclosed others).   Mobility: walker Falls:  Few over the past year and estimates last fall to be around 3 months ago.  Pain:  Arthritis, fibromyalgia.  Managed with Tylenol Arthritis and gabapentin. Depression:  None  Safety: no concerns  Transportation: self Caregiver: Sister if needed but does not live with patient.  Emergency Contact: Sister Advance Directive:  None.  Patient provided patient with document but patient has not completed.  States she is not ready to complete.  Consent:  yes Resources: none DME: walker, W/C, chair lift, oxygen, raised toilet seat with rails, shower chair with back, nebulizer for years but not using; has not been serviced and thinks it may have obtained from [place]. Reading glasses, lower dental bridge.    Co-morbidities: COPD Gold III, Chronic respiratory failure, Hyperlipidemia, nicotine dependence Admissions: 05/20/2016 - 05/23/2016  COPD with acute bronchospastic exacerbation and hypoxic respiratory failure  H/o Treated with IV solumedrol while In-patient and  transitioned to prednisone.  H/o influenza negative,  Patient discharged on continuation of  Azithromycin and advised to continue with nebulizer. (patient states she is not using the nebulizer and has not used in a long time.   BP 134/77 05/20/2016 Weight 184 lb (83 kg) 05/20/2016 Height 60 in  (152 cm) 05/20/2016 BMI 36.00 (Obese Class II) 05/21/2016  Lipid Panel completed 02/15/2016 HDL 62.000 02/15/2016 LDL 129.000 02/15/2016 Cholesterol, total 239.000 02/15/2016 Triglycerides 244.000 02/15/2016 A1C 6.300 05/21/2016 Glucose Random 165.000 05/22/2016  Medications:  Patient taking more than / less than 15 medications?  Patient states she does not know how many medications she is taking.  Per medication review patient is taking more than 15 meds.  Co-pay cost issues: none at this time but yes around September of each year once going into the  [facility].  Prevnar (PCV13) 09/28/2013 Pneumovax (PPS 05/26/2003 Flu Vaccine 02/15/2016 tDAP Vaccine 07/19/2010  Encounter Medications:  Outpatient Encounter Prescriptions as of 05/25/2016  Medication Sig  . albuterol (PROVENTIL HFA;VENTOLIN HFA) 108 (90 BASE) MCG/ACT inhaler Inhale 2 puffs into the lungs every 6 (six) hours as needed. For shortness of breath.  Marland Kitchen aspirin EC 81 MG tablet Take 81 mg by mouth daily.  Marland Kitchen atorvastatin (LIPITOR) 40 MG tablet Take 40 mg by mouth every morning.   Marland Kitchen azithromycin (ZITHROMAX) 500 MG tablet Take 1 tablet (500 mg total) by mouth daily.  . budesonide-formoterol (SYMBICORT) 160-4.5 MCG/ACT inhaler Inhale 2 puffs into the lungs 2 (two) times daily.  . Calcium Carbonate-Vitamin D (CALCIUM + D PO) Take 1 tablet by mouth daily.   . cetirizine (ZYRTEC) 10 MG tablet Take 10 mg by mouth every morning.   . Cholecalciferol (VITAMIN D) 2000 UNITS tablet Take 2,000 Units by mouth daily.  . Cyanocobalamin (VITAMIN B-12) 1000 MCG SUBL Place 1 tablet under the tongue daily.  Marland Kitchen esomeprazole (NEXIUM) 40 MG capsule Take 40 mg by mouth every morning.  Marland Kitchen  gabapentin (NEURONTIN) 100 MG capsule Take 300-400 mg by mouth 2 (two) times daily. 300mg  by mouth in am, 400mg  by mouth in pm  . meloxicam (MOBIC) 15 MG tablet Take 15 mg by mouth daily.  . nitrofurantoin (MACRODANTIN) 50 MG capsule Take 50 mg by mouth daily.  . predniSONE  (DELTASONE) 20 MG tablet Take 3 tablets by mouth daily for 3 days then take 2 tablets daily for 4 days.  Marland Kitchen sertraline (ZOLOFT) 100 MG tablet Take 100 mg by mouth daily.  Marland Kitchen tiotropium (SPIRIVA) 18 MCG inhalation capsule Place 18 mcg into inhaler and inhale every morning.   . vitamin A 10000 UNIT capsule Take 10,000 Units by mouth daily.  . vitamin E 400 UNIT capsule Take 400 Units by mouth daily.  Marland Kitchen albuterol (PROVENTIL) (2.5 MG/3ML) 0.083% nebulizer solution Take 2.5 mg by nebulization 2 (two) times daily as needed. For shortness of breath.   No facility-administered encounter medications on file as of 05/25/2016.     Functional Status:  In your present state of health, do you have any difficulty performing the following activities: 05/25/2016 05/20/2016  Hearing? Y N  Vision? N N  Difficulty concentrating or making decisions? N N  Walking or climbing stairs? Y Y  Dressing or bathing? N N  Doing errands, shopping? [name]  Preparing Food and eating ? N -  Using the Toilet? N -  In the past six months, have you accidently leaked urine? N -  Do you have problems with loss of bowel control? N -  Managing your Medications? N -  Managing your Finances? N -  Housekeeping or managing your Housekeeping? N -  Some recent data might be hidden    Fall/Depression Screening: PHQ 2/9 Scores 05/25/2016 05/25/2016 08/06/2012  PHQ - 2 Score 0 0 2  PHQ- 9 Score - - 7    Fall Risk  05/25/2016 08/06/2012  Falls in the past year? Yes -  Number falls in past yr: 2 or more -  Injury with Fall? Yes -  Risk Factor Category  High Fall Risk -  Risk for fall due to : History of fall(s);Impaired mobility Impaired balance/gait;Impaired mobility  Follow up Falls evaluation completed;Education provided;Falls prevention discussed -    Preventives: Hearing:  History of past screening but unable to recall the date or year.  States hearing loss was confirmed but patient is unable to provide any details. Thinks that an aid  was recommended but she does not have hearing aid.  Patient unable to confirm if she has a hearing aid benefit with current plan coverage with [employer].  Patient confirms interest in hearing aid if she has a benefit.  Eyes: readers only  - unable to recall date of last exam but has been more than a year.  Optometrist:/Opthalmologist:  Not disclosed this call.  Dentist: 1 year  No bridge lower Podiatrist: none Colonoscopy 06/07/2008 Mammogram 03/30/2016  Plan: Referral Date:  05/25/2016 Telephonic Emmi COPD, Screening, Transition of Care and Initial Assessment   05/25/2016 [facility] Telephonic RN CM services 05/25/2016 Program:  -Emmi COPD 05/25/2016 - 05/25/2016 -COPD 05/25/2016 -   Readmission prevention  Medication adherence - nebulizer Advance Directives  Preventives:  hearing  [place] Referral 05/25/16  (COPD) -poly medication -non-adherence with use of nebulizer (ordered to continue use on discharge summary).  Patient using Oxygen @ 2 L/m via [place].  Nebulizer for years but not using; has not been serviced and thinks it may have obtained from [place]  [facility].  RN CM advised in next [facility] scheduled contact call within next 30 days for monthly assessment and / or care coordination services as needed.  RN CM advised to please notify MD of any changes in condition prior to scheduled appt's.   RN CM provided contact name and # [phone] or main office # [phone] and 24-hour nurse line # [phone].  RN CM confirmed patient is aware of 911 services for urgent emergency needs.  RN CM sent successful outreach letter and  [facility] Introductory package. RN CM sent Physician Enrollment/Barriers Letter and Initial Assessment to Primary MD RN CM notified [place] Management Assistant: agreed to services/case opened.  THN CM Care Plan Problem One   Flowsheet Row Most Recent Value  Care Plan Problem One  COPD exacerbation with hospital admission over the past 30 days.   Role Documenting the Problem One   Care Management Telephonic [place] for Problem One  Active  THN Long Term Goal (31-90 days)  Patient will have no hospital admissions over the next 31-90 days.   THN Long Term Goal Start Date  05/25/16  Interventions for Problem One Long Term Goal  RN CM will provide education on condition management and importance of using PCP for any condition changes over the next 31-90 days.   THN CM Short Term Goal #1 (0-30 days)  Patient will adhere to post-hospital discharge instructions and keeping MD appt's over rthe next 30 days.   THN CM Short Term Goal #1 Start Date  05/25/16  Interventions for Short Term Goal #1  RN CM will provide education on importance of keeping MD appt's and following all orders over the next 30 days.     [facility] CM Care Plan Problem Two   Flowsheet Row Most Recent Value  Care Plan Problem Two  Medication adherence:  patient not using nebulizer   Role Documenting the Problem Two  Care Management Telephonic Coordinator  Care Plan for Problem Two  Active  THN CM Short Term Goal #1 (0-30 days)  patient will use nebulizer as ordered / needed over the next 30 days.   THN CM Short Term Goal #1 Start Date  05/25/16  Interventions for Short Term Goal #2   RN CM will provide education on importance of maximizing treatment program over the next 30 days.   THN CM Short Term Goal #2 (0-30 days)  patient will engage with [facility] Pharmacist over the next 30 days.   Interventions for Short Term Goal #2  RN CM will sent [facility] Pharmacist referral for review of nebulizer compliance and poly medication review over the next 30 days.     [facility] CM Care Plan Problem Three   Flowsheet Row Most Recent Value  Care Plan Problem Three  lAdvance Directive: not completed.  Patient has copy provided to her by MD,  patient states she is not ready to complete.   Role Documenting the Problem Three  Care Management Telephonic Coordinator  Care Plan for Problem Three  Active  THN Long Term Goal (31-90)  days  Patient will complete Advance Directive over the next 31-90 days.   THN Long Term Goal Start Date  05/25/16  Interventions for Problem Three Long Term Goal  RN CM will provide education and coaching on completing Advance Directive over the next 31-90 days.   THN CM Short Term Goal #1 (0-30 days)  Patient will review Advance Directive document and discuss any questions over the next 30 days.  THN CM Short Term Goal #1 Start Date  05/25/16  Interventions for Short Term Goal #1  RN CM will follow-up on monthly calls to answer questions and provide guidance on completion over the next 30 days.       Nathaneil Canary, BSN, RN, [place] Care Management Care Management Coordinator [phone] Direct [phone] Cell [phone] Office [phone] Fax [email or web address]

## 2016-05-28 ENCOUNTER — Other Ambulatory Visit: Payer: Self-pay

## 2016-05-28 DIAGNOSIS — J441 Chronic obstructive pulmonary disease with (acute) exacerbation: Secondary | ICD-10-CM | POA: Diagnosis not present

## 2016-05-28 DIAGNOSIS — D649 Anemia, unspecified: Secondary | ICD-10-CM | POA: Diagnosis not present

## 2016-05-28 DIAGNOSIS — R739 Hyperglycemia, unspecified: Secondary | ICD-10-CM | POA: Diagnosis not present

## 2016-05-28 NOTE — Patient Outreach (Signed)
[place] [facility]) Care Management  05/28/2016  JEANINNE BARBANO 10-16-1935 [id number]   Telephonic Care Coordination Services  Contact call to Primary MD:  Dr. [name] [phone] Patient's  last appt: 05/21/2016   -New work-in appt scheduled for today 05/28/2014 at 2:00 pm for evaluation of shortness of breath and rescue inhaler use.    RN CM notified of Discharge instructions and patient's non-adherence -non-adherence with use of nebulizer (ordered to continue use on discharge summary).  Patient using Oxygen @ 2 L/m via .  Nebulizer for years but not using; has not been serviced and thinks may have obtained from [place]. RN CM requested MD provide additional supportive education on importance of using nebulizer.   Nathaneil Canary, BSN, RN, [place] Management Care Management Coordinator [phone] Direct [phone] Cell [phone] Office [phone] Fax [email or web address]

## 2016-05-28 NOTE — Patient Outreach (Signed)
[place] [facility]) Care Management  05/28/2016  [name] 1935-09-05 [id number]   Emmi Consult   [place] Emmi COPD Red Alert:  05/28/16 Source:  [name] COPD Issue: # of times rescue inhaler used in past 24 hours?  3  H/o Patient currently active with [facility] services.  Referral Date:  05/25/2016 Telephonic Emmi COPD, Screening, Transition of Care and Initial Assessment   05/25/2016 [facility] Telephonic RN CM services 05/25/2016 Program:  -Emmi COPD 05/25/2016 - 05/25/2016 -COPD 05/25/2016 -   Subjective: Outreach call #1 to patient; reached and completed call.  [name] [id number] [phone] ([id number] (H) Patient confirms she has used rescue inhaler 3 times today and is having difficulty with shortness of breath.  Patient has notified her Primary MD, Dr. Addison Lank and has appointment scheduled for 2:00 today.  Patient denies fever.  Patient states she has her oxygen on and will transport with oxygen.  Patient will transport herself and states she is ok to do this.  RN CM encouraged patient to allow family or friend to provide transportation during this time but patient states she can manage.   Providers: Primary MD:  Dr. [name] [phone]-  last appt: 05/21/2016   -New work in appt scheduled for today 05/28/2014 at 2:00 pm. Pulmonologist:  Dr. [name] - last appt 02/11/2013 Insurance:  [place] Medicare   Psycho/Social: Patient lives in her home with (undisclosed others).   Mobility: walker Falls:  Few over the past year and estimates last fall to be around 3 months ago.  Pain:  Arthritis, fibromyalgia.  Managed with Tylenol Arthritis and gabapentin. Depression:  None  Safety: no concerns  Transportation: self Caregiver: Sister if needed but does not live with patient.  Emergency Contact: Sister Advance Directive:  None.  Patient provided patient with document but patient has not completed.  States she is not ready to complete.  Consent:  yes Resources:  none DME: walker, W/C, chair lift, oxygen, raised toilet seat with rails, shower chair with back, nebulizer for years but not using; has not been serviced and thinks it may have obtained from [place]. Reading glasses, lower dental bridge.    Co-morbidities: COPD Gold III, Chronic respiratory failure, Hyperlipidemia, nicotine dependence Admissions: 05/20/2016 - 05/23/2016  COPD with acute bronchospastic exacerbation and hypoxic respiratory failure  H/o Treated with IV solumedrol while In-patient and  transitioned to prednisone.  H/o influenza negative,  Patient discharged on continuation of  Azithromycin and advised to continue with nebulizer. (patient states she is not using the nebulizer and has not used in a long time.   BP 134/77 05/20/2016 Weight 184 lb (83 kg) 05/20/2016 Height 60 in (152 cm) 05/20/2016 BMI 36.00 (Obese Class II) 05/21/2016  Lipid Panel completed 02/15/2016 HDL 62.000 02/15/2016 LDL 129.000 02/15/2016 Cholesterol, total 239.000 02/15/2016 Triglycerides 244.000 02/15/2016 A1C 6.300 05/21/2016 Glucose Random 165.000 05/22/2016  Medications:  Patient takes more than 15 meds Co-pay cost issues: around September of each year during [employer].  Prevnar (PCV13) 09/28/2013 Pneumovax (PPS 05/26/2003 Flu Vaccine 02/15/2016 tDAP Vaccine 07/19/2010  Encounter Medications:  Outpatient Encounter Prescriptions as of 05/28/2016  Medication Sig  . albuterol (PROVENTIL HFA;VENTOLIN HFA) 108 (90 BASE) MCG/ACT inhaler Inhale 2 puffs into the lungs every 6 (six) hours as needed. For shortness of breath.  Marland Kitchen albuterol (PROVENTIL) (2.5 MG/3ML) 0.083% nebulizer solution Take 2.5 mg by nebulization 2 (two) times daily as needed. For shortness of breath.  Marland Kitchen aspirin EC 81 MG  tablet Take 81 mg by mouth daily.  Marland Kitchen atorvastatin (LIPITOR) 40 MG tablet Take 40 mg by mouth every morning.   Marland Kitchen azithromycin (ZITHROMAX) 500 MG tablet Take 1 tablet (500 mg total) by mouth daily.  .  budesonide-formoterol (SYMBICORT) 160-4.5 MCG/ACT inhaler Inhale 2 puffs into the lungs 2 (two) times daily.  . Calcium Carbonate-Vitamin D (CALCIUM + D PO) Take 1 tablet by mouth daily.   . cetirizine (ZYRTEC) 10 MG tablet Take 10 mg by mouth every morning.   . Cholecalciferol (VITAMIN D) 2000 UNITS tablet Take 2,000 Units by mouth daily.  . Cyanocobalamin (VITAMIN B-12) 1000 MCG SUBL Place 1 tablet under the tongue daily.  Marland Kitchen esomeprazole (NEXIUM) 40 MG capsule Take 40 mg by mouth every morning.  . gabapentin (NEURONTIN) 100 MG capsule Take 300-400 mg by mouth 2 (two) times daily. 300mg  by mouth in am, 400mg  by mouth in pm  . meloxicam (MOBIC) 15 MG tablet Take 15 mg by mouth daily.  . nitrofurantoin (MACRODANTIN) 50 MG capsule Take 50 mg by mouth daily.  . predniSONE (DELTASONE) 20 MG tablet Take 3 tablets by mouth daily for 3 days then take 2 tablets daily for 4 days.  Marland Kitchen sertraline (ZOLOFT) 100 MG tablet Take 100 mg by mouth daily.  Marland Kitchen tiotropium (SPIRIVA) 18 MCG inhalation capsule Place 18 mcg into inhaler and inhale every morning.   . vitamin A 10000 UNIT capsule Take 10,000 Units by mouth daily.  . vitamin E 400 UNIT capsule Take 400 Units by mouth daily.   No facility-administered encounter medications on file as of 05/28/2016.     Functional Status:  In your present state of health, do you have any difficulty performing the following activities: 05/25/2016 05/20/2016  Hearing? Y N  Vision? N N  Difficulty concentrating or making decisions? N N  Walking or climbing stairs? Y Y  Dressing or bathing? N N  Doing errands, shopping? [name]  Preparing Food and eating ? N -  Using the Toilet? N -  In the past six months, have you accidently leaked urine? N -  Do you have problems with loss of bowel control? N -  Managing your Medications? N -  Managing your Finances? N -  Housekeeping or managing your Housekeeping? N -  Some recent data might be hidden    Fall/Depression Screening: PHQ  2/9 Scores 05/25/2016 05/25/2016 08/06/2012  PHQ - 2 Score 0 0 2  PHQ- 9 Score - - 7    Fall Risk  05/25/2016 08/06/2012  Falls in the past year? Yes -  Number falls in past yr: 2 or more -  Injury with Fall? Yes -  Risk Factor Category  High Fall Risk -  Risk for fall due to : History of fall(s);Impaired mobility Impaired balance/gait;Impaired mobility  Follow up Falls evaluation completed;Education provided;Falls prevention discussed -    Preventives: Hearing:  History of past screening but unable to recall the date or year.  States hearing loss was confirmed but patient is unable to provide any details. Thinks that an aid was recommended but she does not have hearing aid.  Patient unable to confirm if she has a hearing aid benefit with current plan coverage with [employer].  Patient confirms interest in hearing aid if she has a benefit.  Eyes: readers only  - unable to recall date of last exam but has been more than a year.  Optometrist:/Opthalmologist:  Not disclosed this call.  Dentist: 1 year  No bridge  lower Podiatrist: none Colonoscopy 06/07/2008 Mammogram 03/30/2016  Plan: Referral Date:  05/25/2016 Telephonic Emmi COPD, Screening, Transition of Care and Initial Assessment   05/25/2016 [facility] Telephonic RN CM services 05/25/2016 Program:  -Emmi COPD 05/25/2016 - 05/25/2016 -COPD 05/25/2016 -   COPD -Readmission prevention - RN CM will notify Primary of non-adherence with nebulizer and request additional MD supportive education with patient.  -Medication adherence - nebulizer -non-adherence with use of nebulizer (ordered to continue use on discharge summary).  Patient using Oxygen @ 2 L/m via [place].  Nebulizer for years but not using; has not been serviced and thinks it may have obtained from [place].  Advance Directives  -RN CM will continue to engage in discussion.  Preventives:   -hearing screening may need updated.   [place] Referral 05/25/16  (COPD) -poly medication -non-adherence  with use of nebulizer (ordered to continue use on discharge summary).  Patient using Oxygen @ 2 L/m via [place].  Nebulizer for years but not using; has not been serviced and thinks it may have obtained from [place].  RN CM advised in next [facility] scheduled contact call within next 30 days for monthly assessment and / or care coordination services as needed.  RN CM advised to please notify MD of any changes in condition prior to scheduled appt's.   RN CM provided contact name and # [phone] or main office # [phone] and 24-hour nurse line # [phone].  RN CM confirmed patient is aware of 911 services for urgent emergency needs.  Nathaneil Canary, BSN, RN, [place] Care Management Care Management Coordinator [phone] Direct [phone] Cell [phone] Office [phone] Fax [email or web address]

## 2016-05-29 ENCOUNTER — Other Ambulatory Visit: Payer: Self-pay | Admitting: Pharmacist

## 2016-05-29 ENCOUNTER — Ambulatory Visit: Payer: Self-pay | Admitting: Pharmacist

## 2016-05-29 NOTE — Patient Outreach (Signed)
[place] [facility]) Care Management  05/29/2016  [name] 06-05-1935 [id number]   81 yo female with PMHx significant for recent hospitalization for acute exacerbation of COPD, nicotine dependence, HLD, acid reflux, and anemia referred to [place] by Mariann Laster, [facility] RN, for non-adherence with nebulizer machine and > 15 medications.  Noted patient had work-in appointment yesterday with provider due to needing rescue inhaler 3x in 24 hours.    Unsuccessful telephone call to patient.  Left HIPAA compliant voice message.  Called provider office for update on office visit 05/28/2016 and waiting for call back from provider's nurse.    Plan:  Will call patient again later this week to follow-up on nebulizer adherence, medication review, and if patient has any medication needs  Awaiting call back from provider office  Ralene Bathe, PharmD, [place] [phone]

## 2016-05-30 ENCOUNTER — Other Ambulatory Visit: Payer: Self-pay

## 2016-05-30 ENCOUNTER — Ambulatory Visit: Payer: Self-pay | Admitting: Pharmacist

## 2016-05-30 ENCOUNTER — Other Ambulatory Visit: Payer: Self-pay | Admitting: Pharmacist

## 2016-05-30 NOTE — Patient Outreach (Signed)
[place] [facility]) Care Management  05/30/2016  [name] 1935/10/07 [id number]   Emmi COPD Services  Red Alert # of times rescue inhaler used in past 24 hours? 3 Follow-up completed with [place] contact today 05/30/16. No further follow-up needs at this time.   Plan: Referral Date:  05/25/2016 Telephonic Emmi COPD, Screening, Transition of Care and Initial Assessment   05/25/2016 [facility] Telephonic RN CM services 05/25/2016 Program:  -Emmi COPD 05/25/2016 - 05/25/2016 -COPD 05/25/2016 -  RN CM will follow-up on next scheduled contact call within one month.   Nathaneil Canary, BSN, RN, [place] Care Management Care Management Coordinator [phone] Direct [phone] Cell [phone] Office [phone] Fax [email or web address]

## 2016-05-30 NOTE — Patient Outreach (Signed)
[facility]) Care Management  [place]   05/30/2016  JOCEY BRACKENRIDGE 12-05-1935 [id number]  Subjective: 81 yo female with PMHx significant for recent hospitalization for acute exacerbation of COPD, nicotine dependence, HLD, acid reflux, and anemia referred to [place] by Mariann Laster, [facility] RN, for non-adherence with nebulizer machine and > 15 medications.  Noted patient had work-in appointment 05/28/16 with provider due to needing rescue inhaler 3x in 24 hours.  Per provider office, patient was prescribed duoneb solution (ipratropium and albuterol) and albuterol solution to use with her nebulizer machine and patient has follow-up visit next Monday, 06/04/16 @ 1:30 PM.    Successful telephone encounter with patient this morning. HIPAA identifiers verified. Patient states she is not feeling well today and has felt poorly since before her hospitalization (05/20/16-05/23/16) without any improvement during or after admission.  She states she has picked up her nebulizer solutions from the pharmacy and has been using them without any issues.  She states her nebulizer machine is working fine and she has not had any problems with it.  Patient declines a medication review over the phone as she states she has been on her medications for years and has no questions about them.  Patient states she is still using her Spiriva (tiotropium) and Symbicort (budesonide and formoterol) inhalers with her Duoneb solution (ipratropium and albuterol) and will complete her prednisone taper today.     Objective:   Encounter Medications: Outpatient Encounter Prescriptions as of 05/30/2016  Medication Sig  . albuterol (PROVENTIL HFA;VENTOLIN HFA) 108 (90 BASE) MCG/ACT inhaler Inhale 2 puffs into the lungs every 6 (six) hours as needed. For shortness of breath.  Marland Kitchen albuterol (PROVENTIL) (2.5 MG/3ML) 0.083% nebulizer solution Take 2.5 mg by nebulization 2 (two) times daily as needed. For shortness of  breath.  Marland Kitchen aspirin EC 81 MG tablet Take 81 mg by mouth daily.  Marland Kitchen atorvastatin (LIPITOR) 40 MG tablet Take 40 mg by mouth every morning.   Marland Kitchen azithromycin (ZITHROMAX) 500 MG tablet Take 1 tablet (500 mg total) by mouth daily.  . budesonide-formoterol (SYMBICORT) 160-4.5 MCG/ACT inhaler Inhale 2 puffs into the lungs 2 (two) times daily.  . Calcium Carbonate-Vitamin D (CALCIUM + D PO) Take 1 tablet by mouth daily.   . cetirizine (ZYRTEC) 10 MG tablet Take 10 mg by mouth every morning.   . Cholecalciferol (VITAMIN D) 2000 UNITS tablet Take 2,000 Units by mouth daily.  . Cyanocobalamin (VITAMIN B-12) 1000 MCG SUBL Place 1 tablet under the tongue daily.  Marland Kitchen esomeprazole (NEXIUM) 40 MG capsule Take 40 mg by mouth every morning.  . gabapentin (NEURONTIN) 100 MG capsule Take 300-400 mg by mouth 2 (two) times daily. 300mg  by mouth in am, 400mg  by mouth in pm  . meloxicam (MOBIC) 15 MG tablet Take 15 mg by mouth daily.  . nitrofurantoin (MACRODANTIN) 50 MG capsule Take 50 mg by mouth daily.  . predniSONE (DELTASONE) 20 MG tablet Take 3 tablets by mouth daily for 3 days then take 2 tablets daily for 4 days.  Marland Kitchen sertraline (ZOLOFT) 100 MG tablet Take 100 mg by mouth daily.  Marland Kitchen tiotropium (SPIRIVA) 18 MCG inhalation capsule Place 18 mcg into inhaler and inhale every morning.   . vitamin A 10000 UNIT capsule Take 10,000 Units by mouth daily.  . vitamin E 400 UNIT capsule Take 400 Units by mouth daily.   No facility-administered encounter medications on file as of 05/30/2016.     Functional Status: In your present  state of health, do you have any difficulty performing the following activities: 05/25/2016 05/20/2016  Hearing? Y N  Vision? N N  Difficulty concentrating or making decisions? N N  Walking or climbing stairs? Y Y  Dressing or bathing? N N  Doing errands, shopping? [name]  Preparing Food and eating ? N -  Using the Toilet? N -  In the past six months, have you accidently leaked urine? N -  Do you have  problems with loss of bowel control? N -  Managing your Medications? N -  Managing your Finances? N -  Housekeeping or managing your Housekeeping? N -  Some recent data might be hidden    Fall/Depression Screening: PHQ 2/9 Scores 05/25/2016 05/25/2016 08/06/2012  PHQ - 2 Score 0 0 2  PHQ- 9 Score - - 7    Assessment: Patient with recent hospitalization due to COPD exacerbation still feeling poorly despite oral steroid and inhaled short and long-acting beta agonists, anticholinergics and steroid.   Duplicate therapy noted: Spiriva (tiotropium) - long acting anticholinergic Duoneb (ipratropium and albuterol) - short acting anticholinergic / beta 2 agonist  -Concurrent use of scheduled anticholinergic agents should be avoided if possible due to  anticholinergic side effects (urinary retention, constipation, tachycardia, dry mouth, ect) especially in the elderly.    Encouraged patient to follow-up with provider next Monday regarding duration of both Duoneb and Spiriva.    Plan: 1. Pharmacy will close case as patient declines further medication needs or questions.  Patient verbalized understanding to call if any pharmacy related issues arise in the future.    2. Will route note to provider  Ralene Bathe, PharmD, [place] [phone]    1.

## 2016-06-01 ENCOUNTER — Other Ambulatory Visit: Payer: Self-pay

## 2016-06-01 NOTE — Patient Outreach (Signed)
[facility]) Care Management  06/01/2016  [name] 13-Feb-1936 [id number]   Telephonic Transition of Care Services   Referral Date:  05/25/2016 Telephonic Emmi COPD, Screening, Transition of Care and Initial Assessment   05/25/2016 [facility] Telephonic RN CM services 05/25/2016 Program:  -Emmi COPD 05/25/2016 - 05/25/2016 -Transition of Care (Short term)  05/25/2016 -COPD 05/25/2016 -   COPD H/o non-adherence with use of nebulizer (ordered to continue use on discharge summary).  Nebulizer for years but not using; has not been serviced and thinks may have obtained from [place]. Patient using Oxygen @ 2 L/m via [place].   RN CM requested MD provide additional supportive education on importance of using nebulizer.  RN CM notified PCP:  Dr. Cari [name] of patients discharge instructions and patient's non-adherence on 05/28/16.  Patient completed follow-up assessment with Dr. Addison [name] on 05/28/16 and MD discussed non-adherence to nebulizer treatment plan.   MD provided prescription for nebulizer solution and advised to use 3 times a day (per patient).  Patient states she is using and does feel some better but continues to have general malaise.  Patient noted not to be as short of breath on this call while talking.  Patient states she has next follow-up appt with Dr. Addison [name] on Monday, June 04, 2016.      [facility] Pharmacy Referral 05/25/16  (COPD) - completed 05/30/16 -poly medication -non-adherence with use of nebulizer (ordered to continue use on discharge summary).  Patient using Oxygen @ 2 L/m via [place].  Nebulizer for years but not using; has not been serviced and thinks it may have obtained from [place]. [place] Pharmacist noted Duplicate therapy: Spiriva (tiotropium) - long acting anticholinergic Duoneb (ipratropium and albuterol) - short acting anticholinergic / beta 2 agonist Pharmacist advised patient in Concurrent use of scheduled anticholinergic agents should be avoided if  possible due to  anticholinergic side effects (urinary retention, constipation, tachycardia, dry mouth, ect) especially in the elderly.   Pharmacist encouraged patient to follow-up with provider next Monday regarding duration of both Duoneb and Spiriva.    Encounter Medications:  Outpatient Encounter Prescriptions as of 06/01/2016  Medication Sig  . albuterol (PROVENTIL HFA;VENTOLIN HFA) 108 (90 BASE) MCG/ACT inhaler Inhale 2 puffs into the lungs every 6 (six) hours as needed. For shortness of breath.  Marland Kitchen albuterol (PROVENTIL) (2.5 MG/3ML) 0.083% nebulizer solution Take 2.5 mg by nebulization 2 (two) times daily as needed. For shortness of breath.  Marland Kitchen aspirin EC 81 MG tablet Take 81 mg by mouth daily.  Marland Kitchen atorvastatin (LIPITOR) 40 MG tablet Take 40 mg by mouth every morning.   Marland Kitchen azithromycin (ZITHROMAX) 500 MG tablet Take 1 tablet (500 mg total) by mouth daily.  . budesonide-formoterol (SYMBICORT) 160-4.5 MCG/ACT inhaler Inhale 2 puffs into the lungs 2 (two) times daily.  . Calcium Carbonate-Vitamin D (CALCIUM + D PO) Take 1 tablet by mouth daily.   . cetirizine (ZYRTEC) 10 MG tablet Take 10 mg by mouth every morning.   . Cholecalciferol (VITAMIN D) 2000 UNITS tablet Take 2,000 Units by mouth daily.  . Cyanocobalamin (VITAMIN B-12) 1000 MCG SUBL Place 1 tablet under the tongue daily.  Marland Kitchen esomeprazole (NEXIUM) 40 MG capsule Take 40 mg by mouth every morning.  . gabapentin (NEURONTIN) 100 MG capsule Take 300-400 mg by mouth 2 (two) times daily. 300mg  by mouth in am, 400mg  by mouth in pm  . meloxicam (MOBIC) 15 MG tablet Take 15 mg by mouth daily.  . nitrofurantoin (MACRODANTIN)  50 MG capsule Take 50 mg by mouth daily.  . predniSONE (DELTASONE) 20 MG tablet Take 3 tablets by mouth daily for 3 days then take 2 tablets daily for 4 days.  Marland Kitchen sertraline (ZOLOFT) 100 MG tablet Take 100 mg by mouth daily.  Marland Kitchen tiotropium (SPIRIVA) 18 MCG inhalation capsule Place 18 mcg into inhaler and inhale every morning.    . vitamin A 10000 UNIT capsule Take 10,000 Units by mouth daily.  . vitamin E 400 UNIT capsule Take 400 Units by mouth daily.   No facility-administered encounter medications on file as of 06/01/2016.     Functional Status:  In your present state of health, do you have any difficulty performing the following activities: 05/25/2016 05/20/2016  Hearing? Y N  Vision? N N  Difficulty concentrating or making decisions? N N  Walking or climbing stairs? Y Y  Dressing or bathing? N N  Doing errands, shopping? [name]  Preparing Food and eating ? N -  Using the Toilet? N -  In the past six months, have you accidently leaked urine? N -  Do you have problems with loss of bowel control? N -  Managing your Medications? N -  Managing your Finances? N -  Housekeeping or managing your Housekeeping? N -  Some recent data might be hidden    Fall/Depression Screening: PHQ 2/9 Scores 05/25/2016 05/25/2016 08/06/2012  PHQ - 2 Score 0 0 2  PHQ- 9 Score - - 7    Plan:  COPD:  Readmission prevention:  COPD home management adherence:   -RN CM encouraged to continue to use medications as prescribed to improve symptom management to avoid unnecessary readmission.   -RN CM encouraged to keep appt with Monday and report any changes in condition, fever, worsening of symptoms or shortness of breath.   -RN CM will follow-up with patient for updates on next Transition of Care call regarding [place] findings.  __  Advance Directives  -RN CM will continue to engage in discussion.    Preventives:   -Hearing screening may need updated; RN CM will continue to discuss.    RN CM advised in next [facility] scheduled contact call within one week for continued transition of care services. RN CM advised to please notify MD of any changes in condition prior to scheduled appt's.   RN CM provided contact name and # [phone] or main office # [phone] and 24-hour nurse line # [phone].  RN CM confirmed patient is aware  of 911 services for urgent emergency needs.  Nathaneil Canary, BSN, RN, [place] Care Management Care Management Coordinator [phone] Direct [phone] Cell [phone] Office [phone] Fax [email or web address]

## 2016-06-04 DIAGNOSIS — D649 Anemia, unspecified: Secondary | ICD-10-CM | POA: Diagnosis not present

## 2016-06-04 DIAGNOSIS — J441 Chronic obstructive pulmonary disease with (acute) exacerbation: Secondary | ICD-10-CM | POA: Diagnosis not present

## 2016-06-04 DIAGNOSIS — R739 Hyperglycemia, unspecified: Secondary | ICD-10-CM | POA: Diagnosis not present

## 2016-06-08 ENCOUNTER — Other Ambulatory Visit: Payer: Self-pay

## 2016-06-08 NOTE — Patient Outreach (Signed)
[name] Care Management  06/08/2016  [name] 1935/12/02 [id number]   Telephonic Transition of Care Services     [address]  [place] [address] [phone] (M) / (H)  Subjective: Outreach call #1 to patient; reached and completed call.  [place]  [name] [id number] [phone] (H)  Providers: Primary MD:  Dr. [name] -  last appt: 05/21/2016 & 06/04/16 Pulmonologist:  Dr. [name] - last appt 02/11/2013 Insurance:  [name] Medicare   Psycho/Social: Patient lives in her home with (undisclosed others).   Mobility: walker Falls:  Few over the past year and estimates last fall to be around 3 months ago.  Pain:  Arthritis, fibromyalgia.  Managed with Tylenol Arthritis and gabapentin. Depression:  None  Safety: no concerns  Transportation: self Caregiver: Sister if needed but does not live with patient.  Emergency Contact: Sister Advance Directive:  None.  Patient provided patient with document but patient has not completed.  States she is not ready to complete.  Consent:  yes Resources: none DME: walker, W/C, chair lift, oxygen, raised toilet seat with rails, shower chair with back, nebulizer for years but not using; has not been serviced and thinks it may have obtained from [place]. Reading glasses, lower dental bridge.    Co-morbidities: COPD Gold III, Chronic respiratory failure, Hyperlipidemia, nicotine dependence Admissions: 05/20/2016 - 05/23/2016  COPD with acute bronchospastic exacerbation and hypoxic respiratory failure  H/o Treated with IV solumedrol while In-patient and  transitioned to prednisone.  H/o influenza negative,  Patient discharged on continuation of  Azithromycin and advised to continue with nebulizer. (patient states she is not using the nebulizer and has not used in a long time.   BP 134/77 05/25/2016 Weight 184 lb (83 kg) 05/25/2016 Height 60 in (152 cm)  05/25/2016 BMI 36.00 (Obese Class II) 05/25/2016  Lipid Panel completed 02/15/2016 HDL 62.000 02/15/2016 LDL 129.000 02/15/2016 Cholesterol, total 239.000 02/15/2016 Triglycerides 244.000 02/15/2016 A1C 6.300 05/21/2016 Glucose Random 165.000 05/22/2016  COPD H/o non-adherence with use of nebulizer (ordered to continue use on discharge summary).  Nebulizer for years but not using; has not been serviced and thinks may have obtained from [place]. Patient using Oxygen @ 2 L/m via [name].  MD was notified prior to 06/04/16 appointment.  Patient completed follow-up appt with Dr. Addison Lank 06/04/16 and MD reviewed instructions and order for Nebulizer.   Patient states MD advised patient to use three times a day but she is only using twice a day.  Patient states she is feeling better.    Medications:  Patient taking more than 15 medications. Co-pay cost issues: around September of each year once going into the  [facility].  Prevnar (PCV13) 09/28/2013 Pneumovax (PPS 05/26/2003 Flu Vaccine 02/15/2016 tDAP Vaccine 07/19/2010 Pharmacy:  [facility]  Encounter Medications:  Outpatient Encounter Prescriptions as of 06/08/2016  Medication Sig Note  . albuterol (PROVENTIL HFA;VENTOLIN HFA) 108 (90 BASE) MCG/ACT inhaler Inhale 2 puffs into the lungs every 6 (six) hours as needed. For shortness of breath.   Marland Kitchen albuterol (PROVENTIL) (2.5 MG/3ML) 0.083% nebulizer solution Take 2.5 mg by nebulization 2 (two) times daily as needed. For shortness of breath. 06/08/2016: 3 times a day ordered by PCP on 06/04/16. (Patient still using 2 times a day).    Marland Kitchen aspirin EC 81 MG tablet Take 81 mg by mouth daily.   Marland Kitchen atorvastatin (LIPITOR) 40 MG tablet Take 40 mg by mouth every  morning.    Marland Kitchen azithromycin (ZITHROMAX) 500 MG tablet Take 1 tablet (500 mg total) by mouth daily.   . budesonide-formoterol (SYMBICORT) 160-4.5 MCG/ACT inhaler Inhale 2 puffs into the lungs 2 (two) times daily.   . Calcium Carbonate-Vitamin D (CALCIUM + D  PO) Take 1 tablet by mouth daily.    . cetirizine (ZYRTEC) 10 MG tablet Take 10 mg by mouth every morning.    . Cholecalciferol (VITAMIN D) 2000 UNITS tablet Take 2,000 Units by mouth daily.   . Cyanocobalamin (VITAMIN B-12) 1000 MCG SUBL Place 1 tablet under the tongue daily.   Marland Kitchen esomeprazole (NEXIUM) 40 MG capsule Take 40 mg by mouth every morning.   . gabapentin (NEURONTIN) 100 MG capsule Take 300-400 mg by mouth 2 (two) times daily. 300mg  by mouth in am, 400mg  by mouth in pm   . meloxicam (MOBIC) 15 MG tablet Take 15 mg by mouth daily.   . nitrofurantoin (MACRODANTIN) 50 MG capsule Take 50 mg by mouth daily.   . predniSONE (DELTASONE) 20 MG tablet Take 3 tablets by mouth daily for 3 days then take 2 tablets daily for 4 days.   Marland Kitchen sertraline (ZOLOFT) 100 MG tablet Take 100 mg by mouth daily.   Marland Kitchen tiotropium (SPIRIVA) 18 MCG inhalation capsule Place 18 mcg into inhaler and inhale every morning.    . vitamin A 10000 UNIT capsule Take 10,000 Units by mouth daily.   . vitamin E 400 UNIT capsule Take 400 Units by mouth daily.    No facility-administered encounter medications on file as of 06/08/2016.     Functional Status:  In your present state of health, do you have any difficulty performing the following activities: 05/25/2016 05/20/2016  Hearing? Y N  Vision? N N  Difficulty concentrating or making decisions? N N  Walking or climbing stairs? Y Y  Dressing or bathing? N N  Doing errands, shopping? [name]  Preparing Food and eating ? N -  Using the Toilet? N -  In the past six months, have you accidently leaked urine? N -  Do you have problems with loss of bowel control? N -  Managing your Medications? N -  Managing your Finances? N -  Housekeeping or managing your Housekeeping? N -  Some recent data might be hidden    Fall/Depression Screening: PHQ 2/9 Scores 05/25/2016 05/25/2016 08/06/2012  PHQ - 2 Score 0 0 2  PHQ- 9 Score - - 7    Plan: Referral Date:  05/25/2016 Telephonic Emm[facility] COPD,  Screening, Transition of Care and Initial Assessment   05/25/2016 [facility] Telephonic RN CM services 05/25/2016 Program:  -Emmi COPD 05/25/2016 - 05/25/2016 -Transition of Care (Short term)  05/25/2016 - 06/08/16 -COPD 05/25/2016 -   COPD:  Readmission prevention:  COPD home management adherence:   -RN CM encouraged to continue to use medications as prescribed to improve symptom management to avoid unnecessary readmission.   -RN CM encouraged to keep appt with Monday and report any changes in condition, fever, worsening of symptoms or shortness of breath.    Advance Directives  -RN CM will continue to engage in discussion.    Preventives:   -Hearing screening may need updated; RN CM will continue to discuss.    [place] Referral 05/25/16  (COPD) - completed 05/30/16 H/o [facility] Pharmacist noted Duplicate therapy: Spiriva (tiotropium) - long acting anticholinergic Duoneb (ipratropium and albuterol) - short acting anticholinergic / beta 2 agonist Pharmacist advised patient in Concurrent use of scheduled anticholinergic agents  should be avoided if possible due to  anticholinergic side effects (urinary retention, constipation, tachycardia, dry mouth, ect) especially in the elderly.   Pharmacist encouraged patient to follow-up with provider 06/04/16  regarding duration of both Duoneb and Spiriva.    RN CM advised in next [facility] scheduled contact call within one month for Telephonic Monthly Assessment.  RN CM advised to please notify MD of any changes in condition prior to scheduled appt's.   RN CM provided contact name and # [phone] or main office # [phone] and 24-hour nurse line # [phone].  RN CM confirmed patient is aware of 911 services for urgent emergency needs.  Nathaneil Canary, BSN, RN, [place] Management Care Management Coordinator [phone] Direct [phone] Cell [phone] Office [phone]  Fax [email or web address]

## 2016-06-18 DIAGNOSIS — M154 Erosive (osteo)arthritis: Secondary | ICD-10-CM | POA: Diagnosis not present

## 2016-06-18 DIAGNOSIS — M5412 Radiculopathy, cervical region: Secondary | ICD-10-CM | POA: Diagnosis not present

## 2016-06-18 DIAGNOSIS — M2550 Pain in unspecified joint: Secondary | ICD-10-CM | POA: Diagnosis not present

## 2016-06-18 DIAGNOSIS — M150 Primary generalized (osteo)arthritis: Secondary | ICD-10-CM | POA: Diagnosis not present

## 2016-06-22 ENCOUNTER — Other Ambulatory Visit: Payer: Self-pay
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
## 2016-06-25 ENCOUNTER — Ambulatory Visit: Payer: Medicare Other

## 2016-07-02 DIAGNOSIS — R310 Gross hematuria: Secondary | ICD-10-CM | POA: Diagnosis not present

## 2016-07-02 DIAGNOSIS — N329 Bladder disorder, unspecified: Secondary | ICD-10-CM | POA: Diagnosis not present

## 2016-07-23 ENCOUNTER — Ambulatory Visit: Payer: Medicare Other

## 2016-07-24 ENCOUNTER — Other Ambulatory Visit: Payer: Self-pay

## 2016-07-24 NOTE — Patient Outreach (Signed)
[place] [facility]) Care Management  07/24/2016  CHRISMA HURLOCK May 31, 1935 [id number]   Telephone call to patient regarding assessment follow up. HIPAA verified with patient. Patient states she is doing well.  Patient reports she is in the [place] Rase zone today with her COPD. Denies any new onset of symptoms.  Patient states she starts to have problems with her COPD near the end of the year when she gets in the donut hole with her medications. Patient reports having all of her medications at this time.  Patient states she would like to have ongoing follow up education regarding her COPD. Patient verbally agreed to follow up with [facility] care management health coach.  Patient states she continues to take her medications as prescribed. Patient states she is now taking her nebulizer 1 time per day as advised by her primary MD. RNCM updated new nebulizer medication in medication list.  Patient states she is scheduled for a bladder procedure on 08/03/16 with Dr. Junious Silk.  Patient reports she has been having  Patient denies having any further needs at this time.   ASSESSMENT; Most recent hospital admission for COPD exacerbation 05/20/16  PLAN; RNCM will refer patient to health coach for continued COPD disease management.  Quinn Plowman RN,BSN,CCM [facility] Telephonic  [phone]

## 2016-07-31 ENCOUNTER — Other Ambulatory Visit: Payer: Self-pay

## 2016-07-31 NOTE — Patient Outreach (Signed)
[place] [facility]) Care Management  07/31/2016  ROSELEE TAYLOE [birth year] [id number]  Unsuccessful attempt to reach patient for initial assessment.  HIPAA appropriate message left with contact information.  If no call back, RN will make another attempt within one week.  Candie Mile, RN, MSN [place] [phone] Fax [phone]

## 2016-08-01 ENCOUNTER — Other Ambulatory Visit: Payer: Self-pay

## 2016-08-01 NOTE — Patient Outreach (Signed)
[place] [facility]) Care Management  08/01/2016  MASHELL SIEBEN Jan 01, 1936 [id number]  Second unsuccessful attempt to reach patient by phone.  HIPAA appropriate message left with call back information.  If no response, RN will make another attempt within a week.  Candie Mile, RN, MSN [place] [phone] Fax [phone]

## 2016-08-03 ENCOUNTER — Other Ambulatory Visit: Payer: Self-pay

## 2016-08-03 DIAGNOSIS — R350 Frequency of micturition: Secondary | ICD-10-CM | POA: Diagnosis not present

## 2016-08-03 DIAGNOSIS — R3915 Urgency of urination: Secondary | ICD-10-CM | POA: Diagnosis not present

## 2016-08-03 DIAGNOSIS — D494 Neoplasm of unspecified behavior of bladder: Secondary | ICD-10-CM | POA: Diagnosis not present

## 2016-08-03 DIAGNOSIS — N3090 Cystitis, unspecified without hematuria: Secondary | ICD-10-CM | POA: Diagnosis not present

## 2016-08-06 ENCOUNTER — Other Ambulatory Visit: Payer: Self-pay

## 2016-08-06 NOTE — Patient Outreach (Signed)
[place] [facility]) Care Management  08/06/2016  NALEAH KOFOED [birth year] [id number]  Unsuccessful attempt to reach patient by phone.  HIPAA appropriate message left requesting call back.  If no response RN will make another attempt within 5 days.  Candie Mile, RN, MSN [place] [phone] Fax [phone]

## 2016-08-08 ENCOUNTER — Other Ambulatory Visit: Payer: Self-pay

## 2016-08-08 NOTE — Patient Outreach (Signed)
[place] [facility]) Care Management  08/08/2016  [name] [birth year] [id number]   Telephone contact with patient referred for Health Coach Disease management on 07-24-16.  Patient was previously working with [employer] [place].  Explanation of Disease Management services provided.  Patient states she is currently doing well, and does not feel the need for services at this time.  Encouraged her to call as needed to continue [facility] services.  Plan:  Notify PCP and CMA of case closure.  Candie Mile, RN, MSN [place] [phone] Fax [phone]

## 2016-08-27 DIAGNOSIS — Z791 Long term (current) use of non-steroidal anti-inflammatories (NSAID): Secondary | ICD-10-CM | POA: Diagnosis not present

## 2016-08-27 DIAGNOSIS — M85852 Other specified disorders of bone density and structure, left thigh: Secondary | ICD-10-CM | POA: Diagnosis not present

## 2016-08-27 DIAGNOSIS — K219 Gastro-esophageal reflux disease without esophagitis: Secondary | ICD-10-CM | POA: Diagnosis not present

## 2016-08-27 DIAGNOSIS — J449 Chronic obstructive pulmonary disease, unspecified: Secondary | ICD-10-CM | POA: Diagnosis not present

## 2016-08-27 DIAGNOSIS — F3341 Major depressive disorder, recurrent, in partial remission: Secondary | ICD-10-CM | POA: Diagnosis not present

## 2016-08-27 DIAGNOSIS — F3340 Major depressive disorder, recurrent, in remission, unspecified: Secondary | ICD-10-CM | POA: Diagnosis not present

## 2016-08-27 DIAGNOSIS — M0579 Rheumatoid arthritis with rheumatoid factor of multiple sites without organ or systems involvement: Secondary | ICD-10-CM | POA: Diagnosis not present

## 2016-08-27 DIAGNOSIS — R7303 Prediabetes: Secondary | ICD-10-CM | POA: Diagnosis not present

## 2016-08-27 DIAGNOSIS — M797 Fibromyalgia: Secondary | ICD-10-CM | POA: Diagnosis not present

## 2016-08-27 DIAGNOSIS — M48061 Spinal stenosis, lumbar region without neurogenic claudication: Secondary | ICD-10-CM | POA: Diagnosis not present

## 2016-08-27 DIAGNOSIS — E782 Mixed hyperlipidemia: Secondary | ICD-10-CM | POA: Diagnosis not present

## 2016-08-27 DIAGNOSIS — F329 Major depressive disorder, single episode, unspecified: Secondary | ICD-10-CM | POA: Diagnosis not present

## 2016-09-07 DIAGNOSIS — E559 Vitamin D deficiency, unspecified: Secondary | ICD-10-CM | POA: Diagnosis not present

## 2016-09-07 DIAGNOSIS — Z79899 Other long term (current) drug therapy: Secondary | ICD-10-CM | POA: Diagnosis not present

## 2016-09-07 DIAGNOSIS — M797 Fibromyalgia: Secondary | ICD-10-CM | POA: Diagnosis not present

## 2016-09-07 DIAGNOSIS — J441 Chronic obstructive pulmonary disease with (acute) exacerbation: Secondary | ICD-10-CM | POA: Diagnosis not present

## 2016-09-07 DIAGNOSIS — M0579 Rheumatoid arthritis with rheumatoid factor of multiple sites without organ or systems involvement: Secondary | ICD-10-CM | POA: Diagnosis not present

## 2016-09-07 DIAGNOSIS — M8580 Other specified disorders of bone density and structure, unspecified site: Secondary | ICD-10-CM | POA: Diagnosis not present

## 2016-09-07 DIAGNOSIS — R739 Hyperglycemia, unspecified: Secondary | ICD-10-CM | POA: Diagnosis not present

## 2016-09-07 DIAGNOSIS — R7303 Prediabetes: Secondary | ICD-10-CM | POA: Diagnosis not present

## 2016-09-07 DIAGNOSIS — D649 Anemia, unspecified: Secondary | ICD-10-CM | POA: Diagnosis not present

## 2016-09-07 DIAGNOSIS — E782 Mixed hyperlipidemia: Secondary | ICD-10-CM | POA: Diagnosis not present

## 2016-09-07 DIAGNOSIS — J449 Chronic obstructive pulmonary disease, unspecified: Secondary | ICD-10-CM | POA: Diagnosis not present

## 2016-10-12 DIAGNOSIS — M150 Primary generalized (osteo)arthritis: Secondary | ICD-10-CM | POA: Diagnosis not present

## 2016-10-12 DIAGNOSIS — M25519 Pain in unspecified shoulder: Secondary | ICD-10-CM | POA: Diagnosis not present

## 2016-10-12 DIAGNOSIS — M154 Erosive (osteo)arthritis: Secondary | ICD-10-CM | POA: Diagnosis not present

## 2016-10-12 DIAGNOSIS — M0579 Rheumatoid arthritis with rheumatoid factor of multiple sites without organ or systems involvement: Secondary | ICD-10-CM | POA: Diagnosis not present

## 2016-10-12 DIAGNOSIS — Z79899 Other long term (current) drug therapy: Secondary | ICD-10-CM | POA: Diagnosis not present

## 2016-11-05 DIAGNOSIS — R310 Gross hematuria: Secondary | ICD-10-CM | POA: Diagnosis not present

## 2016-11-05 DIAGNOSIS — R350 Frequency of micturition: Secondary | ICD-10-CM | POA: Diagnosis not present

## 2016-11-05 DIAGNOSIS — N3941 Urge incontinence: Secondary | ICD-10-CM | POA: Diagnosis not present

## 2017-01-25 DIAGNOSIS — M545 Low back pain: Secondary | ICD-10-CM | POA: Diagnosis not present

## 2017-01-25 DIAGNOSIS — M179 Osteoarthritis of knee, unspecified: Secondary | ICD-10-CM | POA: Diagnosis not present

## 2017-01-25 DIAGNOSIS — M25562 Pain in left knee: Secondary | ICD-10-CM | POA: Diagnosis not present

## 2017-01-25 DIAGNOSIS — M25559 Pain in unspecified hip: Secondary | ICD-10-CM | POA: Diagnosis not present

## 2017-01-25 DIAGNOSIS — M154 Erosive (osteo)arthritis: Secondary | ICD-10-CM | POA: Diagnosis not present

## 2017-01-25 DIAGNOSIS — M25519 Pain in unspecified shoulder: Secondary | ICD-10-CM | POA: Diagnosis not present

## 2017-01-25 DIAGNOSIS — M79642 Pain in left hand: Secondary | ICD-10-CM | POA: Diagnosis not present

## 2017-01-25 DIAGNOSIS — M0579 Rheumatoid arthritis with rheumatoid factor of multiple sites without organ or systems involvement: Secondary | ICD-10-CM | POA: Diagnosis not present

## 2017-01-25 DIAGNOSIS — M150 Primary generalized (osteo)arthritis: Secondary | ICD-10-CM | POA: Diagnosis not present

## 2017-01-25 DIAGNOSIS — M797 Fibromyalgia: Secondary | ICD-10-CM | POA: Diagnosis not present

## 2017-01-25 DIAGNOSIS — Z79899 Other long term (current) drug therapy: Secondary | ICD-10-CM | POA: Diagnosis not present

## 2017-01-25 DIAGNOSIS — M25561 Pain in right knee: Secondary | ICD-10-CM | POA: Diagnosis not present

## 2017-01-25 DIAGNOSIS — M79641 Pain in right hand: Secondary | ICD-10-CM | POA: Diagnosis not present

## 2017-01-25 DIAGNOSIS — M2550 Pain in unspecified joint: Secondary | ICD-10-CM | POA: Diagnosis not present

## 2017-01-25 DIAGNOSIS — M5412 Radiculopathy, cervical region: Secondary | ICD-10-CM | POA: Diagnosis not present

## 2017-02-22 DIAGNOSIS — H04123 Dry eye syndrome of bilateral lacrimal glands: Secondary | ICD-10-CM | POA: Diagnosis not present

## 2017-02-22 DIAGNOSIS — H16213 Exposure keratoconjunctivitis, bilateral: Secondary | ICD-10-CM | POA: Diagnosis not present

## 2017-02-22 DIAGNOSIS — H01111 Allergic dermatitis of right upper eyelid: Secondary | ICD-10-CM | POA: Diagnosis not present

## 2017-02-22 DIAGNOSIS — H01001 Unspecified blepharitis right upper eyelid: Secondary | ICD-10-CM | POA: Diagnosis not present

## 2017-02-22 DIAGNOSIS — H01114 Allergic dermatitis of left upper eyelid: Secondary | ICD-10-CM | POA: Diagnosis not present

## 2017-02-22 DIAGNOSIS — H01004 Unspecified blepharitis left upper eyelid: Secondary | ICD-10-CM | POA: Diagnosis not present

## 2017-03-01 DIAGNOSIS — H04123 Dry eye syndrome of bilateral lacrimal glands: Secondary | ICD-10-CM | POA: Diagnosis not present

## 2017-03-01 DIAGNOSIS — H01111 Allergic dermatitis of right upper eyelid: Secondary | ICD-10-CM | POA: Diagnosis not present

## 2017-03-01 DIAGNOSIS — H01114 Allergic dermatitis of left upper eyelid: Secondary | ICD-10-CM | POA: Diagnosis not present

## 2017-03-01 DIAGNOSIS — H0100B Unspecified blepharitis left eye, upper and lower eyelids: Secondary | ICD-10-CM | POA: Diagnosis not present

## 2017-03-01 DIAGNOSIS — H16213 Exposure keratoconjunctivitis, bilateral: Secondary | ICD-10-CM | POA: Diagnosis not present

## 2017-03-01 DIAGNOSIS — H0100A Unspecified blepharitis right eye, upper and lower eyelids: Secondary | ICD-10-CM | POA: Diagnosis not present

## 2017-03-04 DIAGNOSIS — J441 Chronic obstructive pulmonary disease with (acute) exacerbation: Secondary | ICD-10-CM | POA: Diagnosis not present

## 2017-03-04 DIAGNOSIS — R739 Hyperglycemia, unspecified: Secondary | ICD-10-CM | POA: Diagnosis not present

## 2017-03-04 DIAGNOSIS — R7303 Prediabetes: Secondary | ICD-10-CM | POA: Diagnosis not present

## 2017-03-04 DIAGNOSIS — E782 Mixed hyperlipidemia: Secondary | ICD-10-CM | POA: Diagnosis not present

## 2017-03-04 DIAGNOSIS — E559 Vitamin D deficiency, unspecified: Secondary | ICD-10-CM | POA: Diagnosis not present

## 2017-03-04 DIAGNOSIS — Z79899 Other long term (current) drug therapy: Secondary | ICD-10-CM | POA: Diagnosis not present

## 2017-03-04 DIAGNOSIS — M0579 Rheumatoid arthritis with rheumatoid factor of multiple sites without organ or systems involvement: Secondary | ICD-10-CM | POA: Diagnosis not present

## 2017-03-04 DIAGNOSIS — D649 Anemia, unspecified: Secondary | ICD-10-CM | POA: Diagnosis not present

## 2017-03-04 DIAGNOSIS — Z791 Long term (current) use of non-steroidal anti-inflammatories (NSAID): Secondary | ICD-10-CM | POA: Diagnosis not present

## 2017-03-04 DIAGNOSIS — M797 Fibromyalgia: Secondary | ICD-10-CM | POA: Diagnosis not present

## 2017-03-04 DIAGNOSIS — M8580 Other specified disorders of bone density and structure, unspecified site: Secondary | ICD-10-CM | POA: Diagnosis not present

## 2017-03-04 DIAGNOSIS — F3340 Major depressive disorder, recurrent, in remission, unspecified: Secondary | ICD-10-CM | POA: Diagnosis not present

## 2017-03-13 DIAGNOSIS — Z0001 Encounter for general adult medical examination with abnormal findings: Secondary | ICD-10-CM | POA: Diagnosis not present

## 2017-03-13 DIAGNOSIS — M797 Fibromyalgia: Secondary | ICD-10-CM | POA: Diagnosis not present

## 2017-03-13 DIAGNOSIS — K219 Gastro-esophageal reflux disease without esophagitis: Secondary | ICD-10-CM | POA: Diagnosis not present

## 2017-03-13 DIAGNOSIS — J449 Chronic obstructive pulmonary disease, unspecified: Secondary | ICD-10-CM | POA: Diagnosis not present

## 2017-03-13 DIAGNOSIS — E785 Hyperlipidemia, unspecified: Secondary | ICD-10-CM | POA: Diagnosis not present

## 2017-03-13 DIAGNOSIS — M159 Polyosteoarthritis, unspecified: Secondary | ICD-10-CM | POA: Diagnosis not present

## 2017-03-13 DIAGNOSIS — R0902 Hypoxemia: Secondary | ICD-10-CM | POA: Diagnosis not present

## 2017-03-13 DIAGNOSIS — Z7189 Other specified counseling: Secondary | ICD-10-CM | POA: Diagnosis not present

## 2017-03-13 DIAGNOSIS — Z23 Encounter for immunization: Secondary | ICD-10-CM | POA: Diagnosis not present

## 2017-03-13 DIAGNOSIS — M0579 Rheumatoid arthritis with rheumatoid factor of multiple sites without organ or systems involvement: Secondary | ICD-10-CM | POA: Diagnosis not present

## 2017-03-13 DIAGNOSIS — G4730 Sleep apnea, unspecified: Secondary | ICD-10-CM | POA: Diagnosis not present

## 2017-03-13 DIAGNOSIS — F3341 Major depressive disorder, recurrent, in partial remission: Secondary | ICD-10-CM | POA: Diagnosis not present

## 2017-05-06 ENCOUNTER — Other Ambulatory Visit: Payer: Self-pay | Admitting: Family Medicine

## 2017-05-06 DIAGNOSIS — Z1231 Encounter for screening mammogram for malignant neoplasm of breast: Secondary | ICD-10-CM

## 2017-05-08 DIAGNOSIS — R350 Frequency of micturition: Secondary | ICD-10-CM | POA: Diagnosis not present

## 2017-05-08 DIAGNOSIS — R351 Nocturia: Secondary | ICD-10-CM | POA: Diagnosis not present

## 2017-05-10 DIAGNOSIS — K219 Gastro-esophageal reflux disease without esophagitis: Secondary | ICD-10-CM | POA: Diagnosis not present

## 2017-05-10 DIAGNOSIS — R070 Pain in throat: Secondary | ICD-10-CM | POA: Diagnosis not present

## 2017-05-10 DIAGNOSIS — K121 Other forms of stomatitis: Secondary | ICD-10-CM | POA: Diagnosis not present

## 2017-05-10 DIAGNOSIS — Z5111 Encounter for antineoplastic chemotherapy: Secondary | ICD-10-CM | POA: Diagnosis not present

## 2017-05-10 DIAGNOSIS — R112 Nausea with vomiting, unspecified: Secondary | ICD-10-CM | POA: Diagnosis not present

## 2017-05-10 DIAGNOSIS — J449 Chronic obstructive pulmonary disease, unspecified: Secondary | ICD-10-CM | POA: Diagnosis not present

## 2017-05-10 DIAGNOSIS — T451X5A Adverse effect of antineoplastic and immunosuppressive drugs, initial encounter: Secondary | ICD-10-CM | POA: Diagnosis not present

## 2017-05-10 DIAGNOSIS — R1110 Vomiting, unspecified: Secondary | ICD-10-CM | POA: Diagnosis not present

## 2017-05-13 ENCOUNTER — Inpatient Hospital Stay (HOSPITAL_COMMUNITY)
Admission: EM | Admit: 2017-05-13 | Discharge: 2017-05-17 | DRG: 918 | Disposition: A | Discharge status: Home / Self Care | Payer: Medicare Other | Attending: Internal Medicine | Admitting: Internal Medicine

## 2017-05-13 ENCOUNTER — Encounter (HOSPITAL_COMMUNITY): Payer: Self-pay | Admitting: Emergency Medicine

## 2017-05-13 ENCOUNTER — Other Ambulatory Visit: Payer: Self-pay

## 2017-05-13 ENCOUNTER — Emergency Department (HOSPITAL_COMMUNITY): Payer: Medicare Other

## 2017-05-13 DIAGNOSIS — K1232 Oral mucositis (ulcerative) due to other drugs: Secondary | ICD-10-CM | POA: Diagnosis present

## 2017-05-13 DIAGNOSIS — Z7951 Long term (current) use of inhaled steroids: Secondary | ICD-10-CM

## 2017-05-13 DIAGNOSIS — J9611 Chronic respiratory failure with hypoxia: Secondary | ICD-10-CM | POA: Diagnosis present

## 2017-05-13 DIAGNOSIS — Y92009 Unspecified place in unspecified non-institutional (private) residence as the place of occurrence of the external cause: Secondary | ICD-10-CM | POA: Diagnosis not present

## 2017-05-13 DIAGNOSIS — Z87891 Personal history of nicotine dependence: Secondary | ICD-10-CM

## 2017-05-13 DIAGNOSIS — T451X1A Poisoning by antineoplastic and immunosuppressive drugs, accidental (unintentional), initial encounter: Secondary | ICD-10-CM | POA: Diagnosis present

## 2017-05-13 DIAGNOSIS — K219 Gastro-esophageal reflux disease without esophagitis: Secondary | ICD-10-CM | POA: Diagnosis present

## 2017-05-13 DIAGNOSIS — M069 Rheumatoid arthritis, unspecified: Secondary | ICD-10-CM | POA: Diagnosis present

## 2017-05-13 DIAGNOSIS — J9811 Atelectasis: Secondary | ICD-10-CM | POA: Diagnosis not present

## 2017-05-13 DIAGNOSIS — E876 Hypokalemia: Secondary | ICD-10-CM | POA: Diagnosis present

## 2017-05-13 DIAGNOSIS — R5383 Other fatigue: Secondary | ICD-10-CM | POA: Diagnosis not present

## 2017-05-13 DIAGNOSIS — G629 Polyneuropathy, unspecified: Secondary | ICD-10-CM | POA: Diagnosis present

## 2017-05-13 DIAGNOSIS — T451X4A Poisoning by antineoplastic and immunosuppressive drugs, undetermined, initial encounter: Secondary | ICD-10-CM

## 2017-05-13 DIAGNOSIS — Z7982 Long term (current) use of aspirin: Secondary | ICD-10-CM

## 2017-05-13 DIAGNOSIS — F329 Major depressive disorder, single episode, unspecified: Secondary | ICD-10-CM | POA: Diagnosis present

## 2017-05-13 DIAGNOSIS — R109 Unspecified abdominal pain: Secondary | ICD-10-CM | POA: Diagnosis not present

## 2017-05-13 DIAGNOSIS — K121 Other forms of stomatitis: Secondary | ICD-10-CM | POA: Diagnosis not present

## 2017-05-13 DIAGNOSIS — J449 Chronic obstructive pulmonary disease, unspecified: Secondary | ICD-10-CM | POA: Diagnosis present

## 2017-05-13 DIAGNOSIS — Z791 Long term (current) use of non-steroidal anti-inflammatories (NSAID): Secondary | ICD-10-CM | POA: Diagnosis not present

## 2017-05-13 DIAGNOSIS — Z981 Arthrodesis status: Secondary | ICD-10-CM | POA: Diagnosis not present

## 2017-05-13 DIAGNOSIS — E785 Hyperlipidemia, unspecified: Secondary | ICD-10-CM | POA: Diagnosis present

## 2017-05-13 DIAGNOSIS — Z9981 Dependence on supplemental oxygen: Secondary | ICD-10-CM

## 2017-05-13 DIAGNOSIS — J029 Acute pharyngitis, unspecified: Secondary | ICD-10-CM | POA: Diagnosis not present

## 2017-05-13 DIAGNOSIS — R300 Dysuria: Secondary | ICD-10-CM | POA: Diagnosis present

## 2017-05-13 LAB — RAPID URINE DRUG SCREEN, HOSP PERFORMED
AMPHETAMINES: NOT DETECTED
Barbiturates: NOT DETECTED
Benzodiazepines: NOT DETECTED
COCAINE: NOT DETECTED
OPIATES: POSITIVE — AB
Tetrahydrocannabinol: NOT DETECTED

## 2017-05-13 LAB — COMPREHENSIVE METABOLIC PANEL
ALT: 28 U/L (ref 14–54)
ANION GAP: 12 (ref 5–15)
AST: 18 U/L (ref 15–41)
Albumin: 3.8 g/dL (ref 3.5–5.0)
Alkaline Phosphatase: 111 U/L (ref 38–126)
BUN: 27 mg/dL — ABNORMAL HIGH (ref 6–20)
CHLORIDE: 99 mmol/L — AB (ref 101–111)
CO2: 27 mmol/L (ref 22–32)
CREATININE: 0.69 mg/dL (ref 0.44–1.00)
Calcium: 9.3 mg/dL (ref 8.9–10.3)
GFR calc Af Amer: >60 mL/min (ref >60)
Glucose, Bld: 125 mg/dL — ABNORMAL HIGH (ref 65–99)
POTASSIUM: 4.3 mmol/L (ref 3.5–5.1)
SODIUM: 138 mmol/L (ref 135–145)
Total Bilirubin: 0.6 mg/dL (ref 0.3–1.2)
Total Protein: 7.5 g/dL (ref 6.5–8.1)

## 2017-05-13 LAB — URINALYSIS, ROUTINE W REFLEX MICROSCOPIC
BILIRUBIN URINE: NEGATIVE
Glucose, UA: NEGATIVE mg/dL
Ketones, ur: 80 mg/dL — AB
NITRITE: NEGATIVE
PROTEIN: 30 mg/dL — AB
SPECIFIC GRAVITY, URINE: 1.024 (ref 1.005–1.030)
pH: 5 (ref 5.0–8.0)

## 2017-05-13 LAB — DIFFERENTIAL
BASOS ABS: 0 10*3/uL (ref 0.0–0.1)
BASOS PCT: 0 %
EOS ABS: 0 10*3/uL (ref 0.0–0.7)
Eosinophils Relative: 0 %
Lymphocytes Relative: 21 %
Lymphs Abs: 0.7 10*3/uL (ref 0.7–4.0)
Monocytes Absolute: 0.1 10*3/uL (ref 0.1–1.0)
Monocytes Relative: 2 %
NEUTROS PCT: 77 %
Neutro Abs: 2.5 10*3/uL (ref 1.7–7.7)

## 2017-05-13 LAB — CBC
HCT: 40.5 % (ref 36.0–46.0)
Hemoglobin: 13.1 g/dL (ref 12.0–15.0)
MCH: 28.9 pg (ref 26.0–34.0)
MCHC: 32.3 g/dL (ref 30.0–36.0)
MCV: 89.2 fL (ref 78.0–100.0)
PLATELETS: 234 10*3/uL (ref 150–400)
RBC: 4.54 MIL/uL (ref 3.87–5.11)
RDW: 12.8 % (ref 11.5–15.5)
WBC: 3.6 10*3/uL — AB (ref 4.0–10.5)

## 2017-05-13 LAB — ACETAMINOPHEN LEVEL

## 2017-05-13 LAB — SALICYLATE LEVEL: Salicylate Lvl: <7 mg/dL (ref 2.8–30.0)

## 2017-05-13 LAB — ETHANOL

## 2017-05-13 LAB — CBG MONITORING, ED: Glucose-Capillary: 126 mg/dL — ABNORMAL HIGH (ref 65–99)

## 2017-05-13 MED ORDER — FOLIC ACID 1 MG PO TABS
1.0000 mg | ORAL_TABLET | Freq: Every day | ORAL | Status: DC
  Administered 2017-05-17: 1 mg via ORAL
  Filled 2017-05-13 (×3): qty 1

## 2017-05-13 MED ORDER — HYDROXYCHLOROQUINE SULFATE 200 MG PO TABS
200.0000 mg | ORAL_TABLET | Freq: Every day | ORAL | Status: DC
  Administered 2017-05-14 – 2017-05-17 (×4): 200 mg via ORAL
  Filled 2017-05-13 (×5): qty 1

## 2017-05-13 MED ORDER — POLYVINYL ALCOHOL 1.4 % OP SOLN
2.0000 [drp] | Freq: Two times a day (BID) | OPHTHALMIC | Status: DC | PRN
  Filled 2017-05-13: qty 15

## 2017-05-13 MED ORDER — IPRATROPIUM-ALBUTEROL 0.5-2.5 (3) MG/3ML IN SOLN
3.0000 mL | Freq: Two times a day (BID) | RESPIRATORY_TRACT | Status: DC
  Administered 2017-05-13 – 2017-05-17 (×8): 3 mL via RESPIRATORY_TRACT
  Filled 2017-05-13 (×8): qty 3

## 2017-05-13 MED ORDER — SODIUM CHLORIDE 0.9 % IV BOLUS (SEPSIS)
1000.0000 mL | Freq: Once | INTRAVENOUS | Status: AC
  Administered 2017-05-13: 1000 mL via INTRAVENOUS

## 2017-05-13 MED ORDER — VITAMIN B-12 1000 MCG PO TABS
1000.0000 ug | ORAL_TABLET | Freq: Every day | ORAL | Status: DC
  Administered 2017-05-17: 1000 ug via ORAL
  Filled 2017-05-13 (×3): qty 1

## 2017-05-13 MED ORDER — GABAPENTIN 100 MG PO CAPS: 200.0000 mg | ORAL_CAPSULE | Freq: Three times a day (TID) | ORAL | Status: DC

## 2017-05-13 MED ORDER — MAGIC MOUTHWASH
15.0000 mL | Freq: Four times a day (QID) | ORAL | Status: DC
  Administered 2017-05-14 – 2017-05-17 (×11): 15 mL via ORAL
  Filled 2017-05-13 (×12): qty 15

## 2017-05-13 MED ORDER — PANTOPRAZOLE SODIUM 40 MG PO TBEC
40.0000 mg | DELAYED_RELEASE_TABLET | Freq: Every day | ORAL | Status: DC
  Administered 2017-05-15 – 2017-05-17 (×3): 40 mg via ORAL
  Filled 2017-05-13 (×4): qty 1

## 2017-05-13 MED ORDER — GABAPENTIN 300 MG PO CAPS
300.0000 mg | ORAL_CAPSULE | Freq: Every day | ORAL | Status: DC
  Administered 2017-05-13 – 2017-05-16 (×4): 300 mg via ORAL
  Filled 2017-05-13 (×5): qty 1

## 2017-05-13 MED ORDER — ASPIRIN EC 81 MG PO TBEC
81.0000 mg | DELAYED_RELEASE_TABLET | Freq: Every day | ORAL | Status: DC
  Administered 2017-05-17: 81 mg via ORAL
  Filled 2017-05-13 (×5): qty 1

## 2017-05-13 MED ORDER — ONDANSETRON 4 MG PO TBDP: 4.0000 mg | ORAL_TABLET | Freq: Four times a day (QID) | ORAL | Status: DC | PRN

## 2017-05-13 MED ORDER — MAGIC MOUTHWASH
5.0000 mL | Freq: Three times a day (TID) | ORAL | Status: DC | PRN
  Filled 2017-05-13: qty 5

## 2017-05-13 MED ORDER — GABAPENTIN 100 MG PO CAPS
200.0000 mg | ORAL_CAPSULE | Freq: Two times a day (BID) | ORAL | Status: DC
  Administered 2017-05-14 – 2017-05-17 (×7): 200 mg via ORAL
  Filled 2017-05-13 (×7): qty 2

## 2017-05-13 MED ORDER — MORPHINE SULFATE (PF) 4 MG/ML IV SOLN
4.0000 mg | Freq: Once | INTRAVENOUS | Status: AC
  Administered 2017-05-13: 4 mg via INTRAVENOUS
  Filled 2017-05-13: qty 1

## 2017-05-13 MED ORDER — VITAMIN E 180 MG (400 UNIT) PO CAPS
400.0000 [IU] | ORAL_CAPSULE | Freq: Every day | ORAL | Status: DC
  Administered 2017-05-17: 400 [IU] via ORAL
  Filled 2017-05-13 (×4): qty 1

## 2017-05-13 MED ORDER — SODIUM CHLORIDE 0.9 % IV SOLN
INTRAVENOUS | Status: DC
  Administered 2017-05-14: 01:00:00 via INTRAVENOUS

## 2017-05-13 MED ORDER — HYDROCODONE-ACETAMINOPHEN 5-325 MG PO TABS
1.0000 | ORAL_TABLET | Freq: Four times a day (QID) | ORAL | Status: DC | PRN
  Administered 2017-05-17: 1 via ORAL
  Filled 2017-05-13: qty 1

## 2017-05-13 MED ORDER — MAGIC MOUTHWASH W/LIDOCAINE
5.0000 mL | Freq: Three times a day (TID) | ORAL | Status: DC | PRN
  Filled 2017-05-13: qty 5

## 2017-05-13 MED ORDER — ALBUTEROL SULFATE HFA 108 (90 BASE) MCG/ACT IN AERS
2.0000 | INHALATION_SPRAY | Freq: Four times a day (QID) | RESPIRATORY_TRACT | Status: DC | PRN
  Filled 2017-05-13: qty 6.7

## 2017-05-13 MED ORDER — VITAMIN A 10000 UNITS PO CAPS
10000.0000 [IU] | ORAL_CAPSULE | Freq: Every day | ORAL | Status: DC
  Administered 2017-05-17: 10000 [IU] via ORAL
  Filled 2017-05-13 (×4): qty 1

## 2017-05-13 MED ORDER — VITAMIN B-12 1000 MCG SL SUBL: 1.0000 | SUBLINGUAL_TABLET | Freq: Every day | SUBLINGUAL | Status: DC

## 2017-05-13 MED ORDER — CARBOXYMETHYLCELLULOSE SODIUM 0.5 % OP SOLN: 2.0000 [drp] | Freq: Two times a day (BID) | OPHTHALMIC | Status: DC | PRN

## 2017-05-13 MED ORDER — TIOTROPIUM BROMIDE MONOHYDRATE 18 MCG IN CAPS
18.0000 ug | ORAL_CAPSULE | Freq: Every morning | RESPIRATORY_TRACT | Status: DC
  Administered 2017-05-16 – 2017-05-17 (×2): 18 ug via RESPIRATORY_TRACT
  Filled 2017-05-13: qty 5

## 2017-05-13 MED ORDER — DEXTROSE 5 % IV SOLN
1.0000 g | Freq: Once | INTRAVENOUS | Status: AC
  Administered 2017-05-13: 1 g via INTRAVENOUS
  Filled 2017-05-13: qty 10

## 2017-05-13 MED ORDER — SODIUM CHLORIDE 0.9 % IV SOLN
Freq: Once | INTRAVENOUS | Status: AC
  Administered 2017-05-13: 20:00:00 via INTRAVENOUS

## 2017-05-13 MED ORDER — SODIUM CHLORIDE 0.9 % IV SOLN
INTRAVENOUS | Status: AC
  Administered 2017-05-13: 20:00:00 via INTRAVENOUS

## 2017-05-13 MED ORDER — AZELASTINE HCL 0.1 % NA SOLN
1.0000 | Freq: Two times a day (BID) | NASAL | Status: DC
  Administered 2017-05-14 – 2017-05-17 (×7): 1 via NASAL
  Filled 2017-05-13: qty 30

## 2017-05-13 MED ORDER — ALBUTEROL SULFATE (2.5 MG/3ML) 0.083% IN NEBU: 2.5000 mg | INHALATION_SOLUTION | Freq: Four times a day (QID) | RESPIRATORY_TRACT | Status: DC | PRN

## 2017-05-13 MED ORDER — MORPHINE SULFATE (PF) 4 MG/ML IV SOLN
2.0000 mg | INTRAVENOUS | Status: DC | PRN
  Administered 2017-05-13 – 2017-05-15 (×3): 2 mg via INTRAVENOUS
  Filled 2017-05-13 (×3): qty 1

## 2017-05-13 MED ORDER — ONDANSETRON HCL 4 MG/2ML IJ SOLN
4.0000 mg | Freq: Once | INTRAMUSCULAR | Status: AC
  Administered 2017-05-13: 4 mg via INTRAVENOUS
  Filled 2017-05-13: qty 2

## 2017-05-13 MED ORDER — SERTRALINE HCL 100 MG PO TABS
100.0000 mg | ORAL_TABLET | Freq: Every day | ORAL | Status: DC
  Administered 2017-05-14 – 2017-05-17 (×4): 100 mg via ORAL
  Filled 2017-05-13 (×4): qty 1

## 2017-05-13 MED ORDER — LIDOCAINE VISCOUS 2 % MT SOLN
15.0000 mL | Freq: Four times a day (QID) | OROMUCOSAL | Status: DC | PRN
  Administered 2017-05-13 – 2017-05-15 (×3): 15 mL via OROMUCOSAL
  Filled 2017-05-13 (×2): qty 15

## 2017-05-13 MED ORDER — HYDROCODONE-ACETAMINOPHEN 7.5-325 MG/15ML PO SOLN: 15.0000 mL | ORAL | Status: DC | PRN

## 2017-05-13 MED ORDER — ONDANSETRON HCL 4 MG/2ML IJ SOLN: 4.0000 mg | Freq: Four times a day (QID) | INTRAMUSCULAR | Status: DC | PRN

## 2017-05-13 MED ORDER — ONDANSETRON HCL 4 MG PO TABS: 4.0000 mg | ORAL_TABLET | Freq: Four times a day (QID) | ORAL | Status: DC | PRN

## 2017-05-13 MED ORDER — ATORVASTATIN CALCIUM 40 MG PO TABS
40.0000 mg | ORAL_TABLET | Freq: Every morning | ORAL | Status: DC
  Administered 2017-05-14 – 2017-05-17 (×4): 40 mg via ORAL
  Filled 2017-05-13 (×4): qty 1

## 2017-05-13 MED ORDER — LEUCOVORIN CALCIUM INJECTION 100 MG
15.0000 mg | Freq: Four times a day (QID) | INTRAVENOUS | Status: DC
  Administered 2017-05-13 – 2017-05-17 (×14): 16 mg via INTRAVENOUS
  Filled 2017-05-13 (×18): qty 0.8

## 2017-05-13 MED ORDER — VITAMIN D 1000 UNITS PO TABS
2000.0000 [IU] | ORAL_TABLET | Freq: Every day | ORAL | Status: DC
  Administered 2017-05-17: 2000 [IU] via ORAL
  Filled 2017-05-13 (×3): qty 2

## 2017-05-13 MED ORDER — MOMETASONE FURO-FORMOTEROL FUM 200-5 MCG/ACT IN AERO
2.0000 | INHALATION_SPRAY | Freq: Two times a day (BID) | RESPIRATORY_TRACT | Status: DC
  Administered 2017-05-16 – 2017-05-17 (×3): 2 via RESPIRATORY_TRACT
  Filled 2017-05-13: qty 8.8

## 2017-05-13 MED ORDER — LORATADINE 10 MG PO TABS
10.0000 mg | ORAL_TABLET | Freq: Every day | ORAL | Status: DC
  Administered 2017-05-17: 10 mg via ORAL
  Filled 2017-05-13 (×3): qty 1

## 2017-05-13 NOTE — ED Notes (Signed)
Bed: WA20 Expected date:  Expected time:  Means of arrival:  Comments: Hold for traige 2

## 2017-05-13 NOTE — ED Triage Notes (Addendum)
Pt brought in by family member complaint of dehydration, n/v/d post accidentally taken too much of methotrexate. Script written methotrexate 2.5 mg tablet take 6 tablets by month once a week as directed; pt took 6 tablets a day for a week; last dose this past Wednesday. Pt takes medicine for arthritis. Pt lips dry and cracked; pt pale in color. Pt oxygen 80 on RA. Pt placed on NRB.

## 2017-05-13 NOTE — ED Provider Notes (Signed)
[facility] DEPT Provider Note   CSN: [id number] Arrival date & time: 05/13/17  1345     History   Chief Complaint Chief Complaint  Patient presents with  . Drug Overdose    HPI [name] is a 82 y.o. female.  HPI   82 year old female with extensive past medical history as below including chronic arthritis on methotrexate presents with accidental overuse of methotrexate.  The patient states she recently had her medications refilled.  She thought that she had was putting them together as prescribed, but reportedly has been taking 6 tablets of methotrexate for the last 7 days.  She normally takes this once a week.  She has had subsequent severe sore throat, mouth sores, nausea, vomiting, and inability to eat or drink.  She was seen in an outside ED on 1/18 and given Magic mouthwash and sent home.  She has not been able to eat or drink since then.  She is had intermittent abdominal cramping.  No known fevers.  She does have some mild dysuria.  No frequency.  No rash.  She has chronic shortness of breath that she states is not acutely worsened.  Denies any sputum production.  Past Medical History:  Diagnosis Date  . Arthritis   . Bladder neoplasm   . Chronic cough   . Chronic respiratory failure ([place])   . COPD (chronic obstructive pulmonary disease) (HCC)    PULMOLOGIST-  DR [name]--  GOLD III W/  CHRONIC RESPIRATORY FAILURE--  O2 DEPENDENT  . Coronary arteriosclerosis   . Cough productive of purulent sputum   . GERD (gastroesophageal reflux disease)   . Hematuria   . History of nephritis    as child dx w/ Bright's disease (glomerulonephritis)  . Mild obstructive sleep apnea    no cpap recommendation  . Neuropathy, peripheral, idiopathic   . Pelvic pain in female   . PONV (postoperative nausea and vomiting)   . Supplemental oxygen dependent    2L  via Nasal Canula --  activity and nighttime  . Urinary incontinence in female     Patient  Active Problem List   Diagnosis Date Noted  . COPD exacerbation ([place]) 05/20/2016  . Anemia 05/20/2016  . Hyperglycemia 05/20/2016  . Pneumonia 03/27/2015  . Malaise   . Emesis   . Shoulder impingement syndrome 03/25/2015  . Cough 08/15/2012  . Chronic respiratory failure ([place]) 04/10/2012  . Constipation 07/03/2011  . Nicotine dependence 07/01/2011  . COPD with acute exacerbation ([place]) 06/29/2011  . COPD GOLD III 06/19/2007  . HYPERLIPIDEMIA 05/15/2007  . ACID REFLUX DISEASE 05/15/2007    Past Surgical History:  Procedure Laterality Date  . APPENDECTOMY  1971  . CARDIAC CATHETERIZATION  05-08-2007  dr Marlou Porch   minor coronary plaquing w/ no significant CAD/  20% mLAD,  10% mRCA,  perserved LV, ef 60-65%  . CATARACT EXTRACTION W/ INTRAOCULAR LENS  IMPLANT, BILATERAL  2001 approx  . CHOLECYSTECTOMY  1988  . CYSTOSCOPY W/ RETROGRADES Bilateral 09/24/2014   Procedure: CYSTOSCOPY WITH RETROGRADE PYELOGRAM;  Surgeon: Festus Aloe, MD;  Location: [facility];  Service: Urology;  Laterality: Bilateral;  . CYSTOSCOPY WITH BIOPSY N/A 09/24/2014   Procedure: CYSTOSCOPY WITH BIOPSY;  Surgeon: Festus Aloe, MD;  Location: [facility];  Service: Urology;  Laterality: N/A;  . ESOPHAGOGASTRODUODENOSCOPY  last one 09-09-2008  . FULGURATION OF BLADDER TUMOR N/A 09/24/2014   Procedure: FULGURATION OF BLADDER TUMOR;  Surgeon: Festus Aloe, MD;  Location: [facility];  Service: Urology;  Laterality: N/A;  . [place]  . POSTERIOR LUMBAR FUSION  10-07-2007   bilateral laminectomy L4 and bilateral L4-5 diskectomy w/ fusion  . TONSILLECTOMY  as child  . TOTAL ABDOMINAL HYSTERECTOMY W/ BILATERAL SALPINGOOPHORECTOMY  1973  . TRANSTHORACIC ECHOCARDIOGRAM  07-07-2011   normal echo,  ef 65-70%    OB History    No data available       Home Medications    Prior to Admission medications   Medication Sig Start Date End Date Taking?  Authorizing Provider  albuterol (PROVENTIL HFA;VENTOLIN HFA) 108 (90 BASE) MCG/ACT inhaler Inhale 2 puffs into the lungs every 6 (six) hours as needed. For shortness of breath.   Yes [provider]  albuterol (PROVENTIL) (2.5 MG/3ML) 0.083% nebulizer solution Take 2.5 mg by nebulization 2 (two) times daily as needed. For shortness of breath.   Yes [provider]  aspirin EC 81 MG tablet Take 81 mg by mouth daily.   Yes [provider]  atorvastatin (LIPITOR) 40 MG tablet Take 40 mg by mouth every morning.    Yes [provider]  azelastine (ASTELIN) 0.1 % nasal spray Place 1 spray into the nose 2 (two) times daily.   Yes [provider]  budesonide-formoterol (SYMBICORT) 160-4.5 MCG/ACT inhaler Inhale 2 puffs into the lungs 2 (two) times daily.   Yes [provider]  Calcium Carbonate-Vitamin D (CALCIUM + D PO) Take 2 tablets by mouth daily.    Yes [provider]  carboxymethylcellulose (REFRESH PLUS) 0.5 % SOLN 2 drops 2 (two) times daily as needed (dry eye).   Yes [provider]  cetirizine (ZYRTEC) 10 MG tablet Take 10 mg by mouth every morning.    Yes [provider]  Cholecalciferol (VITAMIN D) 2000 UNITS tablet Take 2,000 Units by mouth daily.   Yes [provider]  Cyanocobalamin (VITAMIN B-12) 1000 MCG SUBL Place 1 tablet under the tongue daily.   Yes [provider]  esomeprazole (NEXIUM) 40 MG capsule Take 40 mg by mouth every morning.   Yes [provider]  folic acid (FOLVITE) 1 MG tablet Take 1 mg by mouth daily.    Yes [provider]  gabapentin (NEURONTIN) 100 MG capsule Take 200-300 mg by mouth 3 (three) times daily. 200 mg every AM and at noon and 300 mg QHS   Yes [provider]  HYDROcodone-acetaminophen (HYCET) 7.5-325 mg/15 ml solution Take 15 mLs by mouth every 4 (four) hours as needed for moderate pain.  05/10/17 05/20/17 Yes [provider]    hydroxychloroquine (PLAQUENIL) 200 MG tablet Take 200 mg by mouth daily.   Yes [provider]  ipratropium-albuterol (DUONEB) 0.5-2.5 (3) MG/3ML SOLN Take 3 mLs by nebulization 2 (two) times daily.   Yes [provider]  magic mouthwash SOLN Take 15 mLs by mouth every 6 (six) hours. Swish and spit   Yes [provider]  meloxicam (MOBIC) 15 MG tablet Take 15 mg by mouth daily. 05/14/16  Yes [provider]  methotrexate (RHEUMATREX) 2.5 MG tablet Take 15 mg by mouth once a week. 15 mg=6 tablets   Yes [provider]  ondansetron (ZOFRAN-ODT) 4 MG disintegrating tablet Take 4 mg by mouth every 6 (six) hours as needed for nausea or vomiting.   Yes [provider]  sertraline (ZOLOFT) 100 MG tablet Take 100 mg by mouth daily.   Yes [provider]  tiotropium [facility])  18 MCG inhalation capsule Place 18 mcg into inhaler and inhale every morning.    Yes [provider]  vitamin A 10000 UNIT capsule Take 10,000 Units by mouth daily.   Yes [provider]  vitamin E 400 UNIT capsule Take 400 Units by mouth daily.   Yes [provider]  azithromycin (ZITHROMAX) 500 MG tablet Take 1 tablet (500 mg total) by mouth daily. Patient not taking: Reported on 05/13/2017 05/23/16   Regalado, Jerald Kief A, MD  predniSONE (DELTASONE) 20 MG tablet Take 3 tablets by mouth daily for 3 days then take 2 tablets daily for 4 days. Patient not taking: Reported on 07/24/2016 05/23/16   Elmarie Shiley, MD    Family History Family History  Problem Relation Age of Onset  . Asthma Maternal Grandmother     Social History Social History   Tobacco Use  . Smoking status: Former Smoker    Packs/day: 3.00    Years: 60.00    Pack years: 180.00    Types: Cigarettes    Last attempt to quit: 06/25/2011    Years since quitting: 5.8  . Smokeless tobacco: Never Used  Substance Use Topics  . Alcohol use: No  . Drug use: No     Allergies    Kapidex [dexlansoprazole] and Zegerid [omeprazole]   Review of Systems Review of Systems  Constitutional: Positive for fatigue.  HENT: Positive for sore throat.   Gastrointestinal: Positive for abdominal pain, nausea and vomiting.  Genitourinary: Positive for dysuria.  All other systems reviewed and are negative.    Physical Exam Updated Vital Signs BP 119/77   Pulse 86   Temp 98.1 F (36.7 C) (Oral)   Resp 18   Ht 5' (1.524 m)   Wt 79.4 kg (175 lb)   SpO2 100%   BMI 34.18 kg/m   Physical Exam  Constitutional: She is oriented to person, place, and time. She appears well-developed and well-nourished. No distress.  HENT:  Head: Normocephalic and atraumatic.  Severe ulcerations and mucositis of the lips, tongue, and posterior pharynx  Eyes: Conjunctivae are normal.  Neck: Neck supple.  Cardiovascular: Normal rate, regular rhythm and normal heart sounds. Exam reveals no friction rub.  No murmur heard. Pulmonary/Chest: Effort normal and breath sounds normal. No respiratory distress. She has no wheezes. She has no rales.  Abdominal: Soft. She exhibits no distension.  Musculoskeletal: She exhibits no edema.  Neurological: She is alert and oriented to person, place, and time. She exhibits normal muscle tone.  Skin: Skin is warm. Capillary refill takes less than 2 seconds.  Poor skin turgor  Psychiatric: She has a normal mood and affect.  Nursing note and vitals reviewed.    ED Treatments / Results  Labs (all labs ordered are listed, but only abnormal results are displayed) Labs Reviewed  COMPREHENSIVE METABOLIC PANEL - Abnormal; Notable for the following components:      Result Value   Chloride 99 (*)    Glucose, Bld 125 (*)    BUN 27 (*)    All other components within normal limits  ACETAMINOPHEN LEVEL - Abnormal; Notable for the following components:   Acetaminophen (Tylenol), Serum <10 (*)    All other components within normal limits  CBC - Abnormal; Notable  for the following components:   WBC 3.6 (*)    All other components within normal limits  RAPID URINE DRUG SCREEN, HOSP PERFORMED - Abnormal; Notable for the following components:   Opiates POSITIVE (*)  All other components within normal limits  URINALYSIS, ROUTINE W REFLEX MICROSCOPIC - Abnormal; Notable for the following components:   Hgb urine dipstick SMALL (*)    Ketones, ur 80 (*)    Protein, ur 30 (*)    Leukocytes, UA TRACE (*)    Bacteria, UA RARE (*)    Squamous Epithelial / LPF 6-30 (*)    All other components within normal limits  CBG MONITORING, ED - Abnormal; Notable for the following components:   Glucose-Capillary 126 (*)    All other components within normal limits  URINE CULTURE  ETHANOL  SALICYLATE LEVEL  METHOTREXATE  DIFFERENTIAL    EKG  EKG Interpretation None       Radiology Dg Chest 2 View  Result Date: 05/13/2017 CLINICAL DATA:  Dehydration, nausea, vomiting and diarrhea after taking too much methotrexate. EXAM: CHEST  2 VIEW COMPARISON:  05/28/2016 FINDINGS: The heart size and mediastinal contours are within normal limits. Aortic atherosclerosis at the arch without aneurysmal dilatation. Minimal left basilar atelectasis. Emphysematous hyperinflation of the lungs, upper lobe predominant, left greater than right. No pneumonic consolidation, CHF nor effusion. No pneumothorax. The visualized skeletal structures are unremarkable. IMPRESSION: No active cardiopulmonary disease. Chronic stable emphysematous hyperinflation of the lungs. Electronically Signed   By: Ashley Royalty M.D.   On: 05/13/2017 15:49    Procedures Procedures (including critical care time)  Medications Ordered in ED Medications  sodium chloride 0.9 % bolus 1,000 mL (1,000 mLs Intravenous New Bag/Given 05/13/17 1624)  magic mouthwash (not administered)  leucovorin 16 mg in dextrose 5 % 250 mL infusion (not administered)  0.9 %  sodium chloride infusion (not administered)  cefTRIAXone  (ROCEPHIN) 1 g in dextrose 5 % 50 mL IVPB (not administered)  sodium chloride 0.9 % bolus 1,000 mL (0 mLs Intravenous Stopped 05/13/17 1625)  morphine 4 MG/ML injection 4 mg (4 mg Intravenous Given 05/13/17 1625)  ondansetron (ZOFRAN) injection 4 mg (4 mg Intravenous Given 05/13/17 1625)     Initial Impression / Assessment and Plan / ED Course  I have reviewed the triage vital signs and the nursing notes.  Pertinent labs & imaging results that were available during my care of the patient were reviewed by me and considered in my medical decision making (see chart for details).     82 year old female here with accidental methotrexate overuse.  On exam, she has severe stomatitis and appears markedly dehydrated.  Fortunately, renal function and LFTs are normal but she does have significant ketonuria.  She also has some mild pyuria so will cover for this, though it appears to be contaminated sample.  I have contacted and discussed with poison control.  Patient will be started on IV leucovorin 15 mg every 6 hours for 5 days.  They recommend daily methotrexate levels come the first of which has been sent today.  They will continue to monitor.  At this time, they do not feel urine alkalinization is needed and recommend aggressive IV fluids and hydration.  Final Clinical Impressions(s) / ED Diagnoses   Final diagnoses:  Methotrexate overdose of undetermined intent, initial encounter  Stomatitis    ED Discharge Orders    None       Duffy Bruce, MD 05/13/17 [phone]

## 2017-05-13 NOTE — ED Notes (Signed)
ED TO INPATIENT HANDOFF REPORT  Name/Age/Gender [name] 82 y.o. female  Code Status    Code Status Orders  (From admission, onward)        Start     Ordered   05/13/17 2011  Full code  Continuous     05/13/17 2010    Code Status History    Date Active Date Inactive Code Status Order ID Comments User Context   05/20/2016 22:17 05/23/2016 17:07 Full Code [id number]  Jani Gravel, MD Inpatient   03/25/2015 10:54 03/29/2015 21:49 Full Code [id number]  Marcellus Scott Inpatient   07/12/2011 19:11 07/13/2011 17:41 Full Code [id number]  Samuella Cota, MD Inpatient      Home/SNF/Other Home  Chief Complaint OD  Level of Care/Admitting Diagnosis ED Disposition    ED Disposition Condition [place]: [facility] [[id number]]  Level of Care: Telemetry [5]  Admit to tele based on following criteria: Complex arrhythmia (Bradycardia/Tachycardia)  Admit to tele based on following criteria: Monitor QTC interval  Diagnosis: Methotrexate overdose of undetermined intent [032122]  Admitting Physician: Hassel Neth  Attending Physician: Robbie Lis [phone]  Estimated length of stay: past midnight tomorrow  Certification:: I certify this patient will need inpatient services for at least 2 midnights  PT Class (Do Not Modify): Inpatient [101]  PT Acc Code (Do Not Modify): Private [1]       Medical History Past Medical History:  Diagnosis Date  . Arthritis   . Bladder neoplasm   . Chronic cough   . Chronic respiratory failure ([place])   . COPD (chronic obstructive pulmonary disease) (HCC)    PULMOLOGIST-  DR [name]--  GOLD III W/  CHRONIC RESPIRATORY FAILURE--  O2 DEPENDENT  . Coronary arteriosclerosis   . Cough productive of purulent sputum   . GERD (gastroesophageal reflux disease)   . Hematuria   . History of nephritis    as child dx w/ Bright's disease (glomerulonephritis)  . Mild obstructive sleep apnea    no cpap  recommendation  . Neuropathy, peripheral, idiopathic   . Pelvic pain in female   . PONV (postoperative nausea and vomiting)   . Supplemental oxygen dependent    2L  via Nasal Canula --  activity and nighttime  . Urinary incontinence in female     Allergies Allergies  Allergen Reactions  . Kapidex [Dexlansoprazole] Other (See Comments)    Stomach pain, diarrhea  . Zegerid [Omeprazole] Other (See Comments)    Burning in chest    IV Location/Drains/Wounds Patient Lines/Drains/Airways Status   Active Line/Drains/Airways    Name:   Placement date:   Placement time:   Site:   Days:   Peripheral IV 07/06/11 Left Antecubital   07/06/11    1917    Antecubital   2138   Peripheral IV 07/10/11 Right;Anterior Forearm   07/10/11    2221    Forearm   2134   Peripheral IV 03/26/15 Left;Anterior Forearm   03/26/15    1901    Forearm   779   Incision (Closed) 09/24/14 Perineum Other (Comment)   09/24/14    0832     962   Incision (Closed) 03/25/15 Arm Right   03/25/15    0834     780          Labs/Imaging Results for orders placed or performed during the hospital encounter of 05/13/17 (from the past 48 hour(s))  Comprehensive metabolic panel  Status: Abnormal   Collection Time: 05/13/17  1:54 PM  Result Value Ref Range   Sodium 138 135 - 145 mmol/L   Potassium 4.3 3.5 - 5.1 mmol/L   Chloride 99 (L) 101 - 111 mmol/L   CO2 27 22 - 32 mmol/L   Glucose, Bld 125 (H) 65 - 99 mg/dL   BUN 27 (H) 6 - 20 mg/dL   Creatinine, Ser 0.69 0.44 - 1.00 mg/dL   Calcium 9.3 8.9 - 10.3 mg/dL   Total Protein 7.5 6.5 - 8.1 g/dL   Albumin 3.8 3.5 - 5.0 g/dL   AST 18 15 - 41 U/L   ALT 28 14 - 54 U/L   Alkaline Phosphatase 111 38 - 126 U/L   Total Bilirubin 0.6 0.3 - 1.2 mg/dL   GFR calc non Af Amer >60 >60 mL/min   GFR calc Af Amer >60 >60 mL/min    Comment: (NOTE) The eGFR has been calculated using the CKD EPI equation. This calculation has not been validated in all clinical situations. eGFR's  persistently <60 mL/min signify possible Chronic Kidney Disease.    Anion gap 12 5 - 15  cbc     Status: Abnormal   Collection Time: 05/13/17  1:54 PM  Result Value Ref Range   WBC 3.6 (L) 4.0 - 10.5 K/uL   RBC 4.54 3.87 - 5.11 MIL/uL   Hemoglobin 13.1 12.0 - 15.0 g/dL   HCT 40.5 36.0 - 46.0 %   MCV 89.2 78.0 - 100.0 fL   MCH 28.9 26.0 - 34.0 pg   MCHC 32.3 30.0 - 36.0 g/dL   RDW 12.8 11.5 - 15.5 %   Platelets 234 150 - 400 K/uL  Differential     Status: None   Collection Time: 05/13/17  1:54 PM  Result Value Ref Range   Neutrophils Relative % 77 %   Neutro Abs 2.5 1.7 - 7.7 K/uL   Lymphocytes Relative 21 %   Lymphs Abs 0.7 0.7 - 4.0 K/uL   Monocytes Relative 2 %   Monocytes Absolute 0.1 0.1 - 1.0 K/uL   Eosinophils Relative 0 %   Eosinophils Absolute 0.0 0.0 - 0.7 K/uL   Basophils Relative 0 %   Basophils Absolute 0.0 0.0 - 0.1 K/uL  Ethanol     Status: None   Collection Time: 05/13/17  1:55 PM  Result Value Ref Range   Alcohol, Ethyl (B) <10 <10 mg/dL    Comment:        LOWEST DETECTABLE LIMIT FOR SERUM ALCOHOL IS 10 mg/dL FOR MEDICAL PURPOSES ONLY   Salicylate level     Status: None   Collection Time: 05/13/17  1:55 PM  Result Value Ref Range   Salicylate Lvl <9.4 2.8 - 30.0 mg/dL  Acetaminophen level     Status: Abnormal   Collection Time: 05/13/17  1:55 PM  Result Value Ref Range   Acetaminophen (Tylenol), Serum <10 (L) 10 - 30 ug/mL    Comment:        THERAPEUTIC CONCENTRATIONS VARY SIGNIFICANTLY. A RANGE OF 10-30 ug/mL MAY BE AN EFFECTIVE CONCENTRATION FOR MANY PATIENTS. HOWEVER, SOME ARE BEST TREATED AT CONCENTRATIONS OUTSIDE THIS RANGE. ACETAMINOPHEN CONCENTRATIONS >150 ug/mL AT 4 HOURS AFTER INGESTION AND >50 ug/mL AT 12 HOURS AFTER INGESTION ARE OFTEN ASSOCIATED WITH TOXIC REACTIONS.   CBG monitoring, ED     Status: Abnormal   Collection Time: 05/13/17  1:58 PM  Result Value Ref Range   Glucose-Capillary 126 (H) 65 - 99  mg/dL  Rapid urine  drug screen (hospital performed)     Status: Abnormal   Collection Time: 05/13/17  3:43 PM  Result Value Ref Range   Opiates POSITIVE (A) NONE DETECTED   Cocaine NONE DETECTED NONE DETECTED   Benzodiazepines NONE DETECTED NONE DETECTED   Amphetamines NONE DETECTED NONE DETECTED   Tetrahydrocannabinol NONE DETECTED NONE DETECTED   Barbiturates NONE DETECTED NONE DETECTED    Comment: (NOTE) DRUG SCREEN FOR MEDICAL PURPOSES ONLY.  IF CONFIRMATION IS NEEDED FOR ANY PURPOSE, NOTIFY LAB WITHIN 5 DAYS. LOWEST DETECTABLE LIMITS FOR URINE DRUG SCREEN Drug Class                     Cutoff (ng/mL) Amphetamine and metabolites    1000 Barbiturate and metabolites    200 Benzodiazepine                 149 Tricyclics and metabolites     300 Opiates and metabolites        300 Cocaine and metabolites        300 THC                            50   Urinalysis, Routine w reflex microscopic     Status: Abnormal   Collection Time: 05/13/17  3:45 PM  Result Value Ref Range   Color, Urine YELLOW YELLOW   APPearance CLEAR CLEAR   Specific Gravity, Urine 1.024 1.005 - 1.030   pH 5.0 5.0 - 8.0   Glucose, UA NEGATIVE NEGATIVE mg/dL   Hgb urine dipstick SMALL (A) NEGATIVE   Bilirubin Urine NEGATIVE NEGATIVE   Ketones, ur 80 (A) NEGATIVE mg/dL   Protein, ur 30 (A) NEGATIVE mg/dL   Nitrite NEGATIVE NEGATIVE   Leukocytes, UA TRACE (A) NEGATIVE   RBC / HPF 0-5 0 - 5 RBC/hpf   WBC, UA 6-30 0 - 5 WBC/hpf   Bacteria, UA RARE (A) NONE SEEN   Squamous Epithelial / LPF 6-30 (A) NONE SEEN   Dg Chest 2 View  Result Date: 05/13/2017 CLINICAL DATA:  Dehydration, nausea, vomiting and diarrhea after taking too much methotrexate. EXAM: CHEST  2 VIEW COMPARISON:  05/28/2016 FINDINGS: The heart size and mediastinal contours are within normal limits. Aortic atherosclerosis at the arch without aneurysmal dilatation. Minimal left basilar atelectasis. Emphysematous hyperinflation of the lungs, upper lobe predominant, left  greater than right. No pneumonic consolidation, CHF nor effusion. No pneumothorax. The visualized skeletal structures are unremarkable. IMPRESSION: No active cardiopulmonary disease. Chronic stable emphysematous hyperinflation of the lungs. Electronically Signed   By: Ashley Royalty M.D.   On: 05/13/2017 15:49    Pending Labs Unresulted Labs (From admission, onward)   Start     Ordered   05/14/17 0500  Comprehensive metabolic panel  Tomorrow morning,   R     05/13/17 2010   05/14/17 0500  CBC  Tomorrow morning,   R     05/13/17 2010   05/13/17 1621  Urine culture  STAT,   STAT    Question:  Patient immune status  Answer:  Normal   05/13/17 1620   05/13/17 1538  Methotrexate (STAT)  STAT,   STAT     05/13/17 1538      Vitals/Pain Today's Vitals   05/13/17 1628 05/13/17 1753 05/13/17 1757 05/13/17 1944  BP: 119/77 134/65  (!) 148/79  Pulse: 86 84  77  Resp: 18 12  20  Temp:      TempSrc:      SpO2: 100% 98%  95%  Weight:      Height:      PainSc:   10-Worst pain ever     Isolation Precautions No active isolations  Medications Medications  magic mouthwash (not administered)  leucovorin 16 mg in dextrose 5 % 250 mL infusion (0 mg Intravenous Stopped 05/13/17 2002)  cefTRIAXone (ROCEPHIN) 1 g in dextrose 5 % 50 mL IVPB (1 g Intravenous New Bag/Given 05/13/17 2005)  0.9 %  sodium chloride infusion ( Intravenous New Bag/Given 05/13/17 2007)  albuterol (PROVENTIL HFA;VENTOLIN HFA) 108 (90 Base) MCG/ACT inhaler 2 puff (not administered)  aspirin EC tablet 81 mg (not administered)  atorvastatin (LIPITOR) tablet 40 mg (not administered)  azelastine (ASTELIN) 0.1 % nasal spray 1 spray (not administered)  mometasone-formoterol (DULERA) 200-5 MCG/ACT inhaler 2 puff (not administered)  carboxymethylcellulose (REFRESH PLUS) 0.5 % ophthalmic solution 2 drop (not administered)  loratadine (CLARITIN) tablet 10 mg (not administered)  Vitamin D 2,000 Units (not administered)  Vitamin B-12 SUBL  1,000 mcg (not administered)  pantoprazole (PROTONIX) EC tablet 40 mg (not administered)  folic acid (FOLVITE) tablet 1 mg (not administered)  gabapentin (NEURONTIN) capsule 200-300 mg (not administered)  HYDROcodone-acetaminophen (HYCET) 7.5-325 mg/15 ml solution 15 mL (not administered)  hydroxychloroquine (PLAQUENIL) tablet 200 mg (not administered)  magic mouthwash (not administered)  ipratropium-albuterol (DUONEB) 0.5-2.5 (3) MG/3ML nebulizer solution 3 mL (not administered)  ondansetron (ZOFRAN-ODT) disintegrating tablet 4 mg (not administered)  sertraline (ZOLOFT) tablet 100 mg (not administered)  tiotropium (SPIRIVA) inhalation capsule 18 mcg (not administered)  vitamin A capsule 10,000 Units (not administered)  vitamin E capsule 400 Units (not administered)  0.9 %  sodium chloride infusion (not administered)  ondansetron (ZOFRAN) tablet 4 mg (not administered)    Or  ondansetron (ZOFRAN) injection 4 mg (not administered)  sodium chloride 0.9 % bolus 1,000 mL (0 mLs Intravenous Stopped 05/13/17 1728)  sodium chloride 0.9 % bolus 1,000 mL (0 mLs Intravenous Stopped 05/13/17 1625)  morphine 4 MG/ML injection 4 mg (4 mg Intravenous Given 05/13/17 1625)  ondansetron (ZOFRAN) injection 4 mg (4 mg Intravenous Given 05/13/17 1625)  0.9 %  sodium chloride infusion ( Intravenous New Bag/Given 05/13/17 2007)    Mobility walks with device

## 2017-05-13 NOTE — ED Notes (Signed)
RN attempted to gain IV access, but was unsuccessful.  RN Ronalee Belts attempting ultrasound IV access now.

## 2017-05-13 NOTE — H&P (Addendum)
History and Physical    MASSA PE OEV:[id number] DOB: Dec 10, 1935 DOA: 05/13/2017  Referring MD/NP/PA: Dr. Duffy Bruce  PCP: [name], MD    Patient coming from: home   Chief Complaint: nausea, vomiting  HPI: [name] is a 82 y.o. female with medical history significant for rheumatoid arthritis, COPD, neuropathy, dyslipidemia. She was recently seen in the clinic for RA and was given MTX which she was supposed to take 6 tablets once a week and was given 3 month supply. She however, took 6 tablets every single day. She started to have nausea, vomiting, mouth sores, sore throat for past 5 or so days. She was not able to tolerate any PO intake and maybe has had a banana in last 1 week. No fevers. No chest pain, palpations. No cough. No lightheadedness. No diarrhea or constipation.   ED Course: In ED< pt was afebrile. BP was 81/65 and then 148/79, oxygen saturation 80% but not sure if this was an accurate measurement as subsequent numbers were 96-100%. ED physician contacted poison control and they recommended IV leucovorin 15 mg every 6 hours for 5 days and daily MTX level.    Review of Systems:  Constitutional: Negative for fever, chills, diaphoresis, activity change, appetite change and fatigue.  HENT: Negative for ear pain, nosebleeds, congestion, facial swelling, rhinorrhea, neck pain, neck stiffness and ear discharge.   Eyes: Negative for pain, discharge, redness, itching and visual disturbance.  Respiratory: Negative for cough, choking, chest tightness, shortness of breath, wheezing and stridor.   Cardiovascular: Negative for chest pain, palpitations and leg swelling.  Gastrointestinal: Negative for abdominal distention. Positive for nausea, vomiting Genitourinary: Negative for dysuria, urgency, frequency, hematuria, flank pain, decreased urine volume, difficulty urinating and dyspareunia.  Musculoskeletal: Negative for back pain, joint swelling, arthralgias and  gait problem.  Neurological: Negative for dizziness, tremors, seizures, syncope, facial asymmetry, speech difficulty, weakness, light-headedness, numbness and headaches.  Hematological: Negative for adenopathy. Does not bruise/bleed easily.  Psychiatric/Behavioral: Negative for hallucinations, behavioral problems, confusion, dysphoric mood, decreased concentration and agitation.   Past Medical History:  Diagnosis Date  . Arthritis   . Bladder neoplasm   . Chronic cough   . Chronic respiratory failure ([place])   . COPD (chronic obstructive pulmonary disease) (H[facility])    PULMOLOGIST-  DR [name]--  GOLD III W/  CHRONIC RESPIRATORY FAILURE--  O2 DEPENDENT  . Coronary arteriosclerosis   . Cough productive of purulent sputum   . GERD (gastroesophageal reflux disease)   . Hematuria   . History of nephritis    as child dx w/ Bright's disease (glomerulonephritis)  . Mild obstructive sleep apnea    no cpap recommendation  . Neuropathy, peripheral, idiopathic   . Pelvic pain in female   . PONV (postoperative nausea and vomiting)   . Supplemental oxygen dependent    2L  via Nasal Canula --  activity and nighttime  . Urinary incontinence in female     Past Surgical History:  Procedure Laterality Date  . APPENDECTOMY  1971  . CARDIAC CATHETERIZATION  05-08-2007  dr Marlou Porch   minor coronary plaquing w/ no significant CAD/  20% mLAD,  10% mRCA,  perserved LV, ef 60-65%  . CATARACT EXTRACTION W/ INTRAOCULAR LENS  IMPLANT, BILATERAL  2001 approx  . CHOLECYSTECTOMY  1988  . CYSTOSCOPY W/ RETROGRADES Bilateral 09/24/2014   Procedure: CYSTOSCOPY WITH RETROGRADE PYELOGRAM;  Surgeon: Festus Aloe, MD;  Location: [facility];  Service: Urology;  Laterality: Bilateral;  . CYSTOSCOPY  WITH BIOPSY N/A 09/24/2014   Procedure: CYSTOSCOPY WITH BIOPSY;  Surgeon: Festus Aloe, MD;  Location: [facility];  Service: Urology;  Laterality: N/A;  . ESOPHAGOGASTRODUODENOSCOPY  last one  09-09-2008  . FULGURATION OF BLADDER TUMOR N/A 09/24/2014   Procedure: FULGURATION OF BLADDER TUMOR;  Surgeon: Festus Aloe, MD;  Location: [facility];  Service: Urology;  Laterality: N/A;  . [place]  . POSTERIOR LUMBAR FUSION  10-07-2007   bilateral laminectomy L4 and bilateral L4-5 diskectomy w/ fusion  . TONSILLECTOMY  as child  . TOTAL ABDOMINAL HYSTERECTOMY W/ BILATERAL SALPINGOOPHORECTOMY  1973  . TRANSTHORACIC ECHOCARDIOGRAM  07-07-2011   normal echo,  ef 65-70%    Social history:  reports that she quit smoking about 5 years ago. Her smoking use included cigarettes. She has a 180.00 pack-year smoking history. she has never used smokeless tobacco. She reports that she does not drink alcohol or use drugs.  Ambulation: ambulates without assistance at baseline.  Allergies  Allergen Reactions  . Kapidex [Dexlansoprazole] Other (See Comments)    Stomach pain, diarrhea  . Zegerid [Omeprazole] Other (See Comments)    Burning in chest    Family History  Problem Relation Age of Onset  . Asthma Maternal Grandmother     Prior to Admission medications   Medication Sig Start Date End Date Taking? Authorizing Provider  albuterol (PROVENTIL HFA;VENTOLIN HFA) 108 (90 BASE) MCG/ACT inhaler Inhale 2 puffs into the lungs every 6 (six) hours as needed. For shortness of breath.   Yes [provider]  albuterol (PROVENTIL) (2.5 MG/3ML) 0.083% nebulizer solution Take 2.5 mg by nebulization 2 (two) times daily as needed. For shortness of breath.   Yes [provider]  aspirin EC 81 MG tablet Take 81 mg by mouth daily.   Yes [provider]  atorvastatin (LIPITOR) 40 MG tablet Take 40 mg by mouth every morning.    Yes [provider]  azelastine (ASTELIN) 0.1 % nasal spray Place 1 spray into the nose 2 (two) times daily.   Yes [provider]  budesonide-formoterol (SYMBICORT) 160-4.5 MCG/ACT inhaler Inhale 2 puffs into  the lungs 2 (two) times daily.   Yes [provider]  Calcium Carbonate-Vitamin D (CALCIUM + D PO) Take 2 tablets by mouth daily.    Yes [provider]  carboxymethylcellulose (REFRESH PLUS) 0.5 % SOLN 2 drops 2 (two) times daily as needed (dry eye).   Yes [provider]  cetirizine (ZYRTEC) 10 MG tablet Take 10 mg by mouth every morning.    Yes [provider]  Cholecalciferol (VITAMIN D) 2000 UNITS tablet Take 2,000 Units by mouth daily.   Yes [provider]  Cyanocobalamin (VITAMIN B-12) 1000 MCG SUBL Place 1 tablet under the tongue daily.   Yes [provider]  esomeprazole (NEXIUM) 40 MG capsule Take 40 mg by mouth every morning.   Yes [provider]  folic acid (FOLVITE) 1 MG tablet Take 1 mg by mouth daily.    Yes [provider]  gabapentin (NEURONTIN) 100 MG capsule Take 200-300 mg by mouth 3 (three) times daily. 200 mg every AM and at noon and 300 mg QHS   Yes [provider]  HYDROcodone-acetaminophen (HYCET) 7.5-325 mg/15 ml solution Take 15 mLs by mouth every 4 (four) hours as needed for moderate pain.  05/10/17 05/20/17 Yes [provider]  hydroxychloroquine (PLAQUENIL) 200 MG tablet Take 200 mg by mouth daily.   Yes [provider]  ipratropium-albuterol (DUONEB) 0.5-2.5 (3) MG/3ML SOLN Take 3 mLs by nebulization 2 (two) times daily.   Yes [provider]  magic mouthwash SOLN Take 15 mLs by mouth every 6 (six) hours. Swish and spit   Yes [provider]  meloxicam (MOBIC) 15 MG tablet Take 15 mg by mouth daily. 05/14/16  Yes [provider]  methotrexate (RHEUMATREX) 2.5 MG tablet Take 15 mg by mouth once a week. 15 mg=6 tablets   Yes [provider]  ondansetron (ZOFRAN-ODT) 4 MG disintegrating tablet Take 4 mg by mouth every 6 (six) hours as needed for nausea or vomiting.   Yes [provider]  sertraline (ZOLOFT) 100 MG tablet Take 100 mg  by mouth daily.   Yes [provider]  tiotropium (SPIRIVA) 18 MCG inhalation capsule Place 18 mcg into inhaler and inhale every morning.    Yes [provider]  vitamin A 10000 UNIT capsule Take 10,000 Units by mouth daily.   Yes [provider]  vitamin E 400 UNIT capsule Take 400 Units by mouth daily.   Yes [provider]  azithromycin (ZITHROMAX) 500 MG tablet Take 1 tablet (500 mg total) by mouth daily. Patient not taking: Reported on 05/13/2017 05/23/16   Regalado, Jerald Kief A, MD  predniSONE (DELTASONE) 20 MG tablet Take 3 tablets by mouth daily for 3 days then take 2 tablets daily for 4 days. Patient not taking: Reported on 07/24/2016 05/23/16   Elmarie Shiley, MD    Physical Exam: Vitals:   05/13/17 1600 05/13/17 1602 05/13/17 1604 05/13/17 1628  BP: (!) 124/59 (!) 87/71  119/77  Pulse: 91 88  86  Resp: 19 18  18   Temp:      TempSrc:      SpO2: 100% 100%  100%  Weight:   79.4 kg (175 lb)   Height:   5' (1.524 m)     Constitutional: NAD, calm, comfortable Vitals:   05/13/17 1600 05/13/17 1602 05/13/17 1604 05/13/17 1628  BP: (!) 124/59 (!) 87/71  119/77  Pulse: 91 88  86  Resp: 19 18  18   Temp:      TempSrc:      SpO2: 100% 100%  100%  Weight:   79.4 kg (175 lb)   Height:   5' (1.524 m)    Eyes: PERRL, lids and conjunctivae normal ENMT: Mucous membranes are dry, ulcerative and with dried blood on mouth Neck: normal, supple, no masses, no thyromegaly Respiratory: clear to auscultation bilaterally, no wheezing, no crackles. Normal respiratory effort. No accessory muscle use.  Cardiovascular: Regular rate and rhythm. No extremity edema. 2+ pedal pulses. No carotid bruits.  Abdomen: no tenderness, no masses palpated. No hepatosplenomegaly. Bowel sounds positive.  Musculoskeletal: no clubbing / cyanosis. No joint deformity upper and lower extremities. Good ROM, no contractures. Normal muscle tone.  Skin: no rashes, lesions, ulcers. No  induration Neurologic: CN 2-12 grossly intact. Sensation intact, DTR normal. Strength 5/5 in all 4.  Psychiatric: Normal judgment and insight. Alert and oriented x 3. Normal mood.   Labs on Admission: I have personally reviewed following labs and imaging studies  CBC: Recent Labs  Lab 05/13/17 1354  WBC 3.6*  HGB 13.1  HCT 40.5  MCV 89.2  PLT 301   Basic Metabolic Panel: Recent Labs  Lab 05/13/17 1354  NA 138  K 4.3  CL 99*  CO2 27  GLUCOSE 125*  BUN 27*  CREATININE 0.69  CALCIUM 9.3  GFR: Estimated Creatinine Clearance: 53.2 mL/min (by C-G formula based on SCr of 0.69 mg/dL). Liver Function Tests: Recent Labs  Lab 05/13/17 1354  AST 18  ALT 28  ALKPHOS 111  BILITOT 0.6  PROT 7.5  ALBUMIN 3.8   No results for input(s): LIPASE, AMYLASE in the last 168 hours. No results for input(s): AMMONIA in the last 168 hours. Coagulation Profile: No results for input(s): INR, PROTIME in the last 168 hours. Cardiac Enzymes: No results for input(s): CKTOTAL, CKMB, CKMBINDEX, TROPONINI in the last 168 hours. BNP (last 3 results) No results for input(s): PROBNP in the last 8760 hours. HbA1C: No results for input(s): HGBA1C in the last 72 hours. CBG: Recent Labs  Lab 05/13/17 1358  GLUCAP 126*   Lipid Profile: No results for input(s): CHOL, HDL, LDLCALC, TRIG, CHOLHDL, LDLDIRECT in the last 72 hours. Thyroid Function Tests: No results for input(s): TSH, T4TOTAL, FREET4, T3FREE, THYROIDAB in the last 72 hours. Anemia Panel: No results for input(s): VITAMINB12, FOLATE, FERRITIN, TIBC, IRON, RETICCTPCT in the last 72 hours. Urine analysis:    Component Value Date/Time   COLORURINE YELLOW 05/13/2017 [place] 05/13/2017 1545   LABSPEC 1.024 05/13/2017 1545   PHURINE 5.0 05/13/2017 1545   GLUCOSEU NEGATIVE 05/13/2017 1545   HGBUR SMALL (A) 05/13/2017 1545   BILIRUBINUR NEGATIVE 05/13/2017 1545   BILIRUBINUR neg 08/17/2013 1446   KETONESUR 80 (A)  05/13/2017 1545   PROTEINUR 30 (A) 05/13/2017 1545   UROBILINOGEN 0.2 08/17/2013 1446   UROBILINOGEN 0.2 07/06/2011 1519   NITRITE NEGATIVE 05/13/2017 1545   LEUKOCYTESUR TRACE (A) 05/13/2017 1545   Sepsis Labs: @LABRCNTIP (procalcitonin:4,lacticidven:4) )No results found for this or any previous visit (from the past 240 hour(s)).   Radiological Exams on Admission: Dg Chest 2 View  Result Date: 05/13/2017 CLINICAL DATA:  Dehydration, nausea, vomiting and diarrhea after taking too much methotrexate. EXAM: CHEST  2 VIEW COMPARISON:  05/28/2016 FINDINGS: The heart size and mediastinal contours are within normal limits. Aortic atherosclerosis at the arch without aneurysmal dilatation. Minimal left basilar atelectasis. Emphysematous hyperinflation of the lungs, upper lobe predominant, left greater than right. No pneumonic consolidation, CHF nor effusion. No pneumothorax. The visualized skeletal structures are unremarkable. IMPRESSION: No active cardiopulmonary disease. Chronic stable emphysematous hyperinflation of the lungs. Electronically Signed   By: Ashley Royalty M.D.   On: 05/13/2017 15:49    EKG: Pending   Assessment/Plan  Active Problems:   Methotrexate overdose / Ulcerative stomatitis  - Pt with unintentional overdose on MTX - Per poison control, supportive care, leucovorin (1st dose given in ED) 15 mg and to be given every 6 hours for 5 days and to obtain daily MTX level - Continue IV fluids, analgesia as needed - Magic mouthwash with lidocaine    Dyslipidemia - Continue statin therapy    Neuropathy - Continue gabapentin     Hypoxia - No respiratory distress and all subsequent numbers in 96-100% range - CXR without acute cardiopulmonary disease   DVT prophylaxis: SCD's Code Status: fullcode Family Communication: no family at the bedside  Disposition Plan: admission to telemetry  Consults called: poison control    Disposition plan: Further plan will depend as patient's  clinical course evolves and further radiologic and laboratory data become available.    At the time of admission, it appears that the appropriate admission status for this patient is INPATIENT .Thisis judged to be reasonable and necessary in order to provide the required intensity of service to ensure the  patient's safetygiven the patient presentation of overdose on MTX in addition to physical exam findings, radiographic and laboratory data in the context of chronic comorbidities.   Leisa Lenz MD [facility] Pager [phone]  If 7PM-7AM, please contact night-coverage [email or web address] Password [facility]  05/13/2017, 5:17 PM

## 2017-05-13 NOTE — Progress Notes (Signed)
Pt gave permission for her sister to remain in the room at the time of the nursing admission hx. Lucius Conn BSN, RN-BC Admissions RN 05/13/2017 6:16 PM

## 2017-05-14 LAB — COMPREHENSIVE METABOLIC PANEL
ALK PHOS: 82 U/L (ref 38–126)
ALT: 19 U/L (ref 14–54)
AST: 15 U/L (ref 15–41)
Albumin: 3.1 g/dL — ABNORMAL LOW (ref 3.5–5.0)
Anion gap: 6 (ref 5–15)
BILIRUBIN TOTAL: 0.6 mg/dL (ref 0.3–1.2)
BUN: 20 mg/dL (ref 6–20)
CALCIUM: 8.3 mg/dL — AB (ref 8.9–10.3)
CO2: 30 mmol/L (ref 22–32)
Chloride: 103 mmol/L (ref 101–111)
Creatinine, Ser: 0.55 mg/dL (ref 0.44–1.00)
GLUCOSE: 98 mg/dL (ref 65–99)
Potassium: 3.7 mmol/L (ref 3.5–5.1)
Sodium: 139 mmol/L (ref 135–145)
TOTAL PROTEIN: 6.1 g/dL — AB (ref 6.5–8.1)

## 2017-05-14 LAB — METHOTREXATE

## 2017-05-14 LAB — GLUCOSE, CAPILLARY
GLUCOSE-CAPILLARY: 105 mg/dL — AB (ref 65–99)
Glucose-Capillary: 126 mg/dL — ABNORMAL HIGH (ref 65–99)
Glucose-Capillary: 95 mg/dL (ref 65–99)
Glucose-Capillary: 95 mg/dL (ref 65–99)

## 2017-05-14 LAB — CBC
HCT: 34.6 % — ABNORMAL LOW (ref 36.0–46.0)
HEMOGLOBIN: 10.9 g/dL — AB (ref 12.0–15.0)
MCH: 28.3 pg (ref 26.0–34.0)
MCHC: 31.5 g/dL (ref 30.0–36.0)
MCV: 89.9 fL (ref 78.0–100.0)
Platelets: 180 10*3/uL (ref 150–400)
RBC: 3.85 MIL/uL — AB (ref 3.87–5.11)
RDW: 13.1 % (ref 11.5–15.5)
WBC: 5.4 10*3/uL (ref 4.0–10.5)

## 2017-05-14 NOTE — Progress Notes (Signed)
Patient ID: [name], female   DOB: May 09, 1935, 82 y.o.   MRN: [id number]  PROGRESS NOTE    [name]  TIW:[id number] DOB: [birth year] DOA: 05/13/2017  PCP: Sherri Caraway, MD   Brief Narrative:  82 y.o. female with medical history significant for rheumatoid arthritis, COPD, neuropathy, dyslipidemia. She was recently seen in the clinic for RA and was given MTX which she was supposed to take 6 tablets once a week and was given 3 month supply. She however, took 6 tablets every single day. She started to have nausea, vomiting, mouth sores, sore throat for past 5 or so days. She was not able to tolerate any PO intake and maybe has had a banana in last 1 week.  In ED< pt was afebrile. BP was 81/65 and then 148/79, oxygen saturation 80% but not sure if this was an accurate measurement as subsequent numbers were 96-100%. ED physician contacted poison control and they recommended IV leucovorin 15 mg every 6 hours for 5 days and daily MTX level.   Assessment & Plan:   Active Problems:   Methotrexate overdose / Ulcerative stomatitis  - Pt with unintentional overdose on MTX - Per poison control, supportive care, leucovorin (1st dose given in ED) 15 mg and to be given every 6 hours for 5 days and to obtain daily MTX level - MTX level pending this am - Continue magic mouthwash with lidocaine, pt still cannot take much PO  - Supportive care     Dyslipidemia - Continue Lipitor 40 mg at bedtime     Neuropathy - Continue gabapentin     Hypoxia - No respiratory distress and all subsequent numbers in 96-100% range - CXR without acute cardiopulmonary disease - Stable resp status     Rheumatoid arthritis  - Continue Plaquenil     Depression - Continue Zoloft    DVT prophylaxis: SCD's Code Status: full code  Family Communication: no family at the bedside this am Disposition Plan: home once leucovorin completed (started 1/21 and required for 5 days)   Consultants:   Poison  control   Procedures:   None  Antimicrobials:   None   Subjective: Feels little better.  Objective: Vitals:   05/13/17 1944 05/13/17 2102 05/13/17 2303 05/14/17 0643  BP: (!) 148/79 (!) 81/65  136/70  Pulse: 77 70  71  Resp: 20 18  (!) 21  Temp:  98.4 F (36.9 C)  98.3 F (36.8 C)  TempSrc:  Oral  Oral  SpO2: 95% 96% 97% 98%  Weight:      Height:        Intake/Output Summary (Last 24 hours) at 05/14/2017 0936 Last data filed at 05/14/2017 0657 Gross per 24 hour  Intake 2250.8 ml  Output 2 ml  Net 2248.8 ml   Filed Weights   05/13/17 1604  Weight: 79.4 kg (175 lb)    Physical Exam  Constitutional: Appears well-developed and well-nourished. No distress.  HENT: ulcerations mouth area are much better this am Eyes: Conjunctivae and EOM are normal. PERRLA, no scleral icterus.  Neck: Normal ROM. Neck supple. No JVD. No tracheal deviation. No thyromegaly.  CVS: RRR, S1/S2 + Pulmonary: Effort and breath sounds normal, no stridor, rhonchi, wheezes, rales.  Abdominal: Soft. BS +,  no distension, tenderness, rebound or guarding.  Musculoskeletal: Normal range of motion. No edema and no tenderness.  Lymphadenopathy: No lymphadenopathy noted, cervical, inguinal. Neuro: Alert. Normal reflexes, muscle tone coordination. No cranial nerve deficit. Skin: Skin is warm  and dry. No rash noted. Not diaphoretic. No erythema. No pallor.  Psychiatric: Normal mood and affect. Behavior, judgment, thought content normal.    Data Reviewed: I have personally reviewed following labs and imaging studies  CBC: Recent Labs  Lab 05/13/17 1354 05/14/17 0618  WBC 3.6* 5.4  NEUTROABS 2.5  --   HGB 13.1 10.9*  HCT 40.5 34.6*  MCV 89.2 89.9  PLT 234 448   Basic Metabolic Panel: Recent Labs  Lab 05/13/17 1354 05/14/17 0618  NA 138 139  K 4.3 3.7  CL 99* 103  CO2 27 30  GLUCOSE 125* 98  BUN 27* 20  CREATININE 0.69 0.55  CALCIUM 9.3 8.3*   GFR: Estimated Creatinine Clearance:  53.2 mL/min (by C-G formula based on SCr of 0.55 mg/dL). Liver Function Tests: Recent Labs  Lab 05/13/17 1354 05/14/17 0618  AST 18 15  ALT 28 19  ALKPHOS 111 82  BILITOT 0.6 0.6  PROT 7.5 6.1*  ALBUMIN 3.8 3.1*   No results for input(s): LIPASE, AMYLASE in the last 168 hours. No results for input(s): AMMONIA in the last 168 hours. Coagulation Profile: No results for input(s): INR, PROTIME in the last 168 hours. Cardiac Enzymes: No results for input(s): CKTOTAL, CKMB, CKMBINDEX, TROPONINI in the last 168 hours. BNP (last 3 results) No results for input(s): PROBNP in the last 8760 hours. HbA1C: No results for input(s): HGBA1C in the last 72 hours. CBG: Recent Labs  Lab 05/13/17 1358 05/14/17 0743  GLUCAP 126* 105*   Lipid Profile: No results for input(s): CHOL, HDL, LDLCALC, TRIG, CHOLHDL, LDLDIRECT in the last 72 hours. Thyroid Function Tests: No results for input(s): TSH, T4TOTAL, FREET4, T3FREE, THYROIDAB in the last 72 hours. Anemia Panel: No results for input(s): VITAMINB12, FOLATE, FERRITIN, TIBC, IRON, RETICCTPCT in the last 72 hours. Urine analysis:    Component Value Date/Time   COLORURINE YELLOW 05/13/2017 [place] 05/13/2017 1545   LABSPEC 1.024 05/13/2017 1545   PHURINE 5.0 05/13/2017 1545   GLUCOSEU NEGATIVE 05/13/2017 1545   HGBUR SMALL (A) 05/13/2017 1545   BILIRUBINUR NEGATIVE 05/13/2017 1545   BILIRUBINUR neg 08/17/2013 1446   KETONESUR 80 (A) 05/13/2017 1545   PROTEINUR 30 (A) 05/13/2017 1545   UROBILINOGEN 0.2 08/17/2013 1446   UROBILINOGEN 0.2 07/06/2011 1519   NITRITE NEGATIVE 05/13/2017 1545   LEUKOCYTESUR TRACE (A) 05/13/2017 1545   Sepsis Labs: @LABRCNTIP (procalcitonin:4,lacticidven:4)   )No results found for this or any previous visit (from the past 240 hour(s)).    Radiology Studies: Dg Chest 2 View  Result Date: 05/13/2017 CLINICAL DATA:  Dehydration, nausea, vomiting and diarrhea after taking too much  methotrexate. EXAM: CHEST  2 VIEW COMPARISON:  05/28/2016 FINDINGS: The heart size and mediastinal contours are within normal limits. Aortic atherosclerosis at the arch without aneurysmal dilatation. Minimal left basilar atelectasis. Emphysematous hyperinflation of the lungs, upper lobe predominant, left greater than right. No pneumonic consolidation, CHF nor effusion. No pneumothorax. The visualized skeletal structures are unremarkable. IMPRESSION: No active cardiopulmonary disease. Chronic stable emphysematous hyperinflation of the lungs. Electronically Signed   By: Ashley Royalty M.D.   On: 05/13/2017 15:49      Scheduled Meds: . aspirin EC  81 mg Oral Daily  . atorvastatin  40 mg Oral q morning - 10a  . azelastine  1 spray Each Nare BID  . cholecalciferol  2,000 Units Oral Daily  . folic acid  1 mg Oral Daily  . gabapentin  200 mg Oral BID WC  .  gabapentin  300 mg Oral QHS  . hydroxychloroquine  200 mg Oral Daily  . ipratropium-albuterol  3 mL Nebulization BID  . leucovorin (WELLCOVORIN) IV infusion  16 mg Intravenous Q6H  . loratadine  10 mg Oral Daily  . magic mouthwash  15 mL Oral Q6H  . mometasone-formoterol  2 puff Inhalation BID  . pantoprazole  40 mg Oral Daily  . sertraline  100 mg Oral Daily  . tiotropium  18 mcg Inhalation q morning - 10a  . vitamin A  10,000 Units Oral Daily  . vitamin B-12  1,000 mcg Oral Daily  . vitamin E  400 Units Oral Daily   Continuous Infusions: . sodium chloride 75 mL/hr at 05/14/17 0047     LOS: 1 day    Time spent: 25 minutes  Greater than 50% of the time spent on counseling and coordinating the care.   Leisa Lenz, MD [facility] Pager [phone]  If 7PM-7AM, please contact night-coverage [email or web address] Password [facility] 05/14/2017, 9:36 AM

## 2017-05-14 NOTE — Progress Notes (Signed)
Initial Nutrition Assessment  DOCUMENTATION CODES:   Obesity unspecified  INTERVENTION:    Monitor for diet toleration  Magic cup TID with meals, each supplement provides 290 kcal and 9 grams of protein  NUTRITION DIAGNOSIS:   Inadequate oral intake related to mouth pain/sore throat as evidenced by per patient/family report.  GOAL:   Patient will meet greater than or equal to 90% of their needs  MONITOR:   PO intake, Supplement acceptance, Weight trends, Labs  REASON FOR ASSESSMENT:   Malnutrition Screening Tool    ASSESSMENT:   Pt with PMH significant for RA, COPD, neuropathy, and dyslipidemia. Presents this admission with accidental methotrexate overdose resulting in nausea/vomiting, and mouth sores.    Spoke with pt at bedside. Reports not eating anything for the last five days related to pain upon swallowing. States she could not tolerate her own salvia. Before this started pt's appetite was stable, consuming one meal with multiple snacks throughout the day. This admission pt has not attempted to eat anything but is willing to try water soon. Will provide pt with Magic Cup once she begins tolerating other food options.   Pt reports a UBW of 176 lb. Denies any recent wt loss. Records show pt weighed 184 lb in Jan 2018 and 175 lb this admission. Insignificant for time frame.   Medications reviewed and include: Vit D, folic acid, Vit A, Vit B12, Vit E, NS @ 75 ml/hr Labs reviewed.   NUTRITION - FOCUSED PHYSICAL EXAM:    Most Recent Value  Orbital Region  No depletion  Upper Arm Region  No depletion  Thoracic and Lumbar Region  Unable to assess  Buccal Region  No depletion  Temple Region  No depletion  Clavicle Bone Region  No depletion  Clavicle and Acromion Bone Region  No depletion  Scapular Bone Region  Unable to assess  Dorsal Hand  No depletion  Patellar Region  No depletion  Anterior Thigh Region  No depletion  Posterior Calf Region  No depletion  Edema  (RD Assessment)  None  Hair  Reviewed  Eyes  Reviewed  Mouth  Reviewed  Skin  Reviewed  Nails  Reviewed     Diet Order:  Diet Heart Room service appropriate? Yes; Fluid consistency: Thin  EDUCATION NEEDS:   Not appropriate for education at this time  Skin:  Skin Assessment: Reviewed RN Assessment  Last BM:  PTA  Height:   Ht Readings from Last 1 Encounters:  05/13/17 5' (1.524 m)    Weight:   Wt Readings from Last 1 Encounters:  05/13/17 175 lb (79.4 kg)    Ideal Body Weight:  45.5 kg  BMI:  Body mass index is 34.18 kg/m.  Estimated Nutritional Needs:   Kcal:  1400-1600 kcal/day  Protein:  70-80 g/day  Fluid:  >1.4 L/day    Mariana Single RD, LDN Clinical Nutrition Pager # - [phone]

## 2017-05-14 NOTE — Care Management Note (Signed)
Case Management Note  Patient Details  Name: [name] MRN: [id number] Date of Birth: 10-18-1935  Subjective/Objective:                  overdose  Action/Plan: Date: May 11, 2017 Velva Harman, BSN, [place], [place] Chart and notes review for patient progress and needs. Will follow for case management and discharge needs. No cm or discharge needs present at time of this review. Next review date: [id number]  Expected Discharge Date:  (unknown)               Expected Discharge Plan:  Home/Self Care  In-House Referral:  Clinical Social Work  Discharge planning Services  CM Consult  Post Acute Care Choice:    Choice offered to:     DME Arranged:    DME Agency:     HH Arranged:    HH Agency:     Status of Service:     If discussed at [employer], dates discussed:    Additional Comments:  Leeroy Cha, RN 05/14/2017, 9:13 AM

## 2017-05-15 LAB — URINE CULTURE: SPECIAL REQUESTS: NORMAL

## 2017-05-15 LAB — GLUCOSE, CAPILLARY: GLUCOSE-CAPILLARY: 122 mg/dL — AB (ref 65–99)

## 2017-05-15 LAB — METHOTREXATE

## 2017-05-15 MED ORDER — SALINE SPRAY 0.65 % NA SOLN
1.0000 | NASAL | Status: DC | PRN
  Administered 2017-05-15: 1 via NASAL
  Filled 2017-05-15: qty 44

## 2017-05-15 MED ORDER — LIP MEDEX EX OINT
TOPICAL_OINTMENT | CUTANEOUS | Status: DC | PRN
  Filled 2017-05-15: qty 7

## 2017-05-15 NOTE — Progress Notes (Signed)
Patient ID: [name], female   DOB: [birth year], 82 y.o.   MRN: [id number]  PROGRESS NOTE    [name]  RCV:[id number] DOB: 03-29-1936 DOA: 05/13/2017  PCP: [name], MD   Brief Narrative:  82 y.o. female with medical history significant for rheumatoid arthritis, COPD, neuropathy, dyslipidemia. She was recently seen in the clinic for RA and was given MTX which she was supposed to take 6 tablets once a week and was given 3 month supply. She however, took 6 tablets every single day. She started to have nausea, vomiting, mouth sores, sore throat for past 5 or so days. She was not able to tolerate any PO intake and maybe has had a banana in last 1 week.  In ED< pt was afebrile. BP was 81/65 and then 148/79, oxygen saturation 80% but not sure if this was an accurate measurement as subsequent numbers were 96-100%. ED physician contacted poison control and they recommended IV leucovorin 15 mg every 6 hours for 5 days and daily MTX level.   Assessment & Plan:   Active Problems:   Methotrexate overdose / Ulcerative stomatitis  - Pt with unintentional overdose on MTX - Per poison control, supportive care, leucovorin (1st dose given in ED) 15 mg and to be given every 6 hours for 5 days and to obtain daily MTX level - Continue magic mouthwash with lidocaine, pt still cannot take much PO  - MTX level WNL - Continue supportive care     Dyslipidemia - Continue Lipitor 40 mg at bedtime     Neuropathy - Continue gabapentin     Hypoxia - No respiratory distress and all subsequent numbers in 96-100% range - CXR without acute cardiopulmonary disease - Stable resp status     Rheumatoid arthritis  - Continue Plaquenil     Depression -- Continue Zoloft    DVT prophylaxis: SCD's Code Status: full code  Family Communication: no family at the bedside Disposition Plan: home once leucovorin completed (Started 1/21) - needs 5 days total of leucovorin    Consultants:   Poison  control   Procedures:   None  Antimicrobials:   None   Subjective: No overnight events.  Objective: Vitals:   05/14/17 2115 05/14/17 2121 05/15/17 0425 05/15/17 1016  BP: (!) 148/78  (!) 127/49   Pulse: 79  74   Resp: 20  16   Temp: 98.5 F (36.9 C)  98 F (36.7 C)   TempSrc: Oral  Oral   SpO2: 97% 97% 99% 92%  Weight:      Height:        Intake/Output Summary (Last 24 hours) at 05/15/2017 1017 Last data filed at 05/15/2017 0428 Gross per 24 hour  Intake 2058.65 ml  Output -  Net 2058.65 ml   Filed Weights   05/13/17 1604  Weight: 79.4 kg (175 lb)    Physical Exam  Constitutional: Appears well-developed and well-nourished. No distress.  CVS: RRR, S1/S2 + Pulmonary: Effort and breath sounds normal, no stridor, rhonchi, wheezes, rales.  Abdominal: Soft. BS +,  no distension, tenderness, rebound or guarding.  Musculoskeletal: Normal range of motion. No edema and no tenderness.  Lymphadenopathy: No lymphadenopathy noted, cervical, inguinal. Neuro: Alert. Normal reflexes, muscle tone coordination. No cranial nerve deficit. Skin: Skin is warm and dry, lesions on the mouth much better  Psychiatric: Normal mood and affect. Behavior, judgment, thought content normal.    Data Reviewed: I have personally reviewed following labs and imaging studies  CBC:  Recent Labs  Lab 05/13/17 1354 05/14/17 0618  WBC 3.6* 5.4  NEUTROABS 2.5  --   HGB 13.1 10.9*  HCT 40.5 34.6*  MCV 89.2 89.9  PLT 234 867   Basic Metabolic Panel: Recent Labs  Lab 05/13/17 1354 05/14/17 0618  NA 138 139  K 4.3 3.7  CL 99* 103  CO2 27 30  GLUCOSE 125* 98  BUN 27* 20  CREATININE 0.69 0.55  CALCIUM 9.3 8.3*   GFR: Estimated Creatinine Clearance: 53.2 mL/min (by C-G formula based on SCr of 0.55 mg/dL). Liver Function Tests: Recent Labs  Lab 05/13/17 1354 05/14/17 0618  AST 18 15  ALT 28 19  ALKPHOS 111 82  BILITOT 0.6 0.6  PROT 7.5 6.1*  ALBUMIN 3.8 3.1*   No results for  input(s): LIPASE, AMYLASE in the last 168 hours. No results for input(s): AMMONIA in the last 168 hours. Coagulation Profile: No results for input(s): INR, PROTIME in the last 168 hours. Cardiac Enzymes: No results for input(s): CKTOTAL, CKMB, CKMBINDEX, TROPONINI in the last 168 hours. BNP (last 3 results) No results for input(s): PROBNP in the last 8760 hours. HbA1C: No results for input(s): HGBA1C in the last 72 hours. CBG: Recent Labs  Lab 05/14/17 0743 05/14/17 1144 05/14/17 1620 05/14/17 2125 05/15/17 0828  GLUCAP 105* 126* 95 95 122*   Lipid Profile: No results for input(s): CHOL, HDL, LDLCALC, TRIG, CHOLHDL, LDLDIRECT in the last 72 hours. Thyroid Function Tests: No results for input(s): TSH, T4TOTAL, FREET4, T3FREE, THYROIDAB in the last 72 hours. Anemia Panel: No results for input(s): VITAMINB12, FOLATE, FERRITIN, TIBC, IRON, RETICCTPCT in the last 72 hours. Urine analysis:    Component Value Date/Time   COLORURINE YELLOW 05/13/2017 [place] 05/13/2017 1545   LABSPEC 1.024 05/13/2017 1545   PHURINE 5.0 05/13/2017 1545   GLUCOSEU NEGATIVE 05/13/2017 1545   HGBUR SMALL (A) 05/13/2017 1545   BILIRUBINUR NEGATIVE 05/13/2017 1545   BILIRUBINUR neg 08/17/2013 1446   KETONESUR 80 (A) 05/13/2017 1545   PROTEINUR 30 (A) 05/13/2017 1545   UROBILINOGEN 0.2 08/17/2013 1446   UROBILINOGEN 0.2 07/06/2011 1519   NITRITE NEGATIVE 05/13/2017 1545   LEUKOCYTESUR TRACE (A) 05/13/2017 1545   Sepsis Labs: @LABRCNTIP (procalcitonin:4,lacticidven:4)   ) Recent Results (from the past 240 hour(s))  Urine culture     Status: Abnormal   Collection Time: 05/13/17  3:45 PM  Result Value Ref Range Status   Specimen Description URINE, CLEAN CATCH  Final   Special Requests Normal  Final   Culture MULTIPLE SPECIES PRESENT, SUGGEST RECOLLECTION (A)  Final   Report Status 05/15/2017 FINAL  Final      Radiology Studies: Dg Chest 2 View  Result Date:  05/13/2017 CLINICAL DATA:  Dehydration, nausea, vomiting and diarrhea after taking too much methotrexate. EXAM: CHEST  2 VIEW COMPARISON:  05/28/2016 FINDINGS: The heart size and mediastinal contours are within normal limits. Aortic atherosclerosis at the arch without aneurysmal dilatation. Minimal left basilar atelectasis. Emphysematous hyperinflation of the lungs, upper lobe predominant, left greater than right. No pneumonic consolidation, CHF nor effusion. No pneumothorax. The visualized skeletal structures are unremarkable. IMPRESSION: No active cardiopulmonary disease. Chronic stable emphysematous hyperinflation of the lungs. Electronically Signed   By: Ashley Royalty M.D.   On: 05/13/2017 15:49      Scheduled Meds: . aspirin EC  81 mg Oral Daily  . atorvastatin  40 mg Oral q morning - 10a  . azelastine  1 spray Each Nare BID  .  cholecalciferol  2,000 Units Oral Daily  . folic acid  1 mg Oral Daily  . gabapentin  200 mg Oral BID WC  . gabapentin  300 mg Oral QHS  . hydroxychloroquine  200 mg Oral Daily  . ipratropium-albuterol  3 mL Nebulization BID  . leucovorin (WELLCOVORIN) IV infusion  16 mg Intravenous Q6H  . loratadine  10 mg Oral Daily  . magic mouthwash  15 mL Oral Q6H  . mometasone-formoterol  2 puff Inhalation BID  . pantoprazole  40 mg Oral Daily  . sertraline  100 mg Oral Daily  . tiotropium  18 mcg Inhalation q morning - 10a  . vitamin A  10,000 Units Oral Daily  . vitamin B-12  1,000 mcg Oral Daily  . vitamin E  400 Units Oral Daily   Continuous Infusions: . sodium chloride 75 mL/hr at 05/14/17 0047     LOS: 2 days    Time spent: 25 minutes  Greater than 50% of the time spent on counseling and coordinating the care.   Leisa Lenz, MD [facility] Pager [phone]  If 7PM-7AM, please contact night-coverage [email or web address] Password [facility] 05/15/2017, 10:17 AM

## 2017-05-15 NOTE — Progress Notes (Signed)
Rt wrist PIV noted to be infiltrated , pt without complaints of discomfort. Swelling noted about 4 inches past insertion site, Possible the Leucovorin infiltration, pharmacy was notified to inquire about infiltration concerns

## 2017-05-16 DIAGNOSIS — K121 Other forms of stomatitis: Secondary | ICD-10-CM

## 2017-05-16 LAB — CBC
HCT: 33.7 % — ABNORMAL LOW (ref 36.0–46.0)
Hemoglobin: 10.9 g/dL — ABNORMAL LOW (ref 12.0–15.0)
MCH: 28.3 pg (ref 26.0–34.0)
MCHC: 32.3 g/dL (ref 30.0–36.0)
MCV: 87.5 fL (ref 78.0–100.0)
PLATELETS: 176 10*3/uL (ref 150–400)
RBC: 3.85 MIL/uL — ABNORMAL LOW (ref 3.87–5.11)
RDW: 13.2 % (ref 11.5–15.5)
WBC: 5.6 10*3/uL (ref 4.0–10.5)

## 2017-05-16 LAB — URINALYSIS, ROUTINE W REFLEX MICROSCOPIC
BILIRUBIN URINE: NEGATIVE
GLUCOSE, UA: NEGATIVE mg/dL
HGB URINE DIPSTICK: NEGATIVE
KETONES UR: NEGATIVE mg/dL
NITRITE: NEGATIVE
PROTEIN: NEGATIVE mg/dL
Specific Gravity, Urine: 1.006 (ref 1.005–1.030)
pH: 6 (ref 5.0–8.0)

## 2017-05-16 LAB — GLUCOSE, CAPILLARY: Glucose-Capillary: 117 mg/dL — ABNORMAL HIGH (ref 65–99)

## 2017-05-16 NOTE — Progress Notes (Addendum)
Patient ID: [name], female   DOB: Feb 08, 1936, 82 y.o.   MRN: [id number]  PROGRESS NOTE    [name]  TKZ:[id number] DOB: 1935-11-01 DOA: 05/13/2017  PCP: Sherri Caraway, MD   Brief Narrative:  82 y.o. female with medical history significant for rheumatoid arthritis, COPD, neuropathy, dyslipidemia. She was recently seen in the clinic for RA and was given MTX which she was supposed to take 6 tablets once a week and was given 3 month supply. She however, took 6 tablets every single day. She started to have nausea, vomiting, mouth sores, sore throat for past 5 or so days. She was not able to tolerate any PO intake and maybe has had a banana in last 1 week.  In ED< pt was afebrile. BP was 81/65 and then 148/79, oxygen saturation 80% but not sure if this was an accurate measurement as subsequent numbers were 96-100%. ED physician contacted poison control and they recommended IV leucovorin 15 mg every 6 hours for 5 days and daily MTX level.   Assessment & Plan:   Active Problems:   Methotrexate overdose / Ulcerative stomatitis  - Pt with unintentional overdose on MTX - Per poison control, supportive care, leucovorin (1st dose given in ED) 15 mg and to be given every 6 hours for 5 days and to obtain daily MTX level - Continue magic mouthwash with lidocaine, pt still cannot take much PO  - MTX level WNL - Continue supportive care     Dyslipidemia - Continue Lipitor 40 mg at bedtime     Neuropathy - Continue gabapentin     h/o copd /home o2 dependent: - - CXR without acute cardiopulmonary disease - Stable resp status , no wheezing -encourage incentive spirometer    Rheumatoid arthritis  - Continue Plaquenil     Depression -- Continue Zoloft    DVT prophylaxis: SCD's Code Status: full code  Family Communication: no family at the bedside Disposition Plan: home once leucovorin completed (Started 1/21) - needs 5 days total of leucovorin  Home , likely on  1/25  Consultants:   Poison control   Procedures:   None  Antimicrobials:   None   Subjective: Report mucositis is improving, swallowing is getting a little better Report abdominal pain and dysuria, urine is clear  Objective: Vitals:   05/15/17 2034 05/15/17 2119 05/16/17 0533 05/16/17 0925  BP:  (!) 116/53 (!) 150/71   Pulse:  96 70   Resp:  20 20   Temp:  98.5 F (36.9 C) 98.1 F (36.7 C)   TempSrc:  Oral Oral   SpO2: 98% 95% 97% 98%  Weight:      Height:        Intake/Output Summary (Last 24 hours) at 05/16/2017 1038 Last data filed at 05/16/2017 0842 Gross per 24 hour  Intake 540 ml  Output -  Net 540 ml   Filed Weights   05/13/17 1604  Weight: 79.4 kg (175 lb)    Physical Exam  Constitutional: Appears well-developed and well-nourished. No distress. + mucositis CVS: RRR, S1/S2 + Pulmonary: Effort and breath sounds normal, no stridor, rhonchi, wheezes, rales.  Abdominal: Soft. BS +,  no distension, tenderness, rebound or guarding.  Musculoskeletal: Normal range of motion. No edema and no tenderness.  Lymphadenopathy: No lymphadenopathy noted, cervical, inguinal. Neuro: Alert. Normal reflexes, muscle tone coordination. No cranial nerve deficit. Skin: Skin is warm and dry, lesions on the mouth much better  Psychiatric: Normal mood and affect. Behavior, judgment,  thought content normal.    Data Reviewed: I have personally reviewed following labs and imaging studies  CBC: Recent Labs  Lab 05/13/17 1354 05/14/17 0618  WBC 3.6* 5.4  NEUTROABS 2.5  --   HGB 13.1 10.9*  HCT 40.5 34.6*  MCV 89.2 89.9  PLT 234 623   Basic Metabolic Panel: Recent Labs  Lab 05/13/17 1354 05/14/17 0618  NA 138 139  K 4.3 3.7  CL 99* 103  CO2 27 30  GLUCOSE 125* 98  BUN 27* 20  CREATININE 0.69 0.55  CALCIUM 9.3 8.3*   GFR: Estimated Creatinine Clearance: 53.2 mL/min (by C-G formula based on SCr of 0.55 mg/dL). Liver Function Tests: Recent Labs  Lab  05/13/17 1354 05/14/17 0618  AST 18 15  ALT 28 19  ALKPHOS 111 82  BILITOT 0.6 0.6  PROT 7.5 6.1*  ALBUMIN 3.8 3.1*   No results for input(s): LIPASE, AMYLASE in the last 168 hours. No results for input(s): AMMONIA in the last 168 hours. Coagulation Profile: No results for input(s): INR, PROTIME in the last 168 hours. Cardiac Enzymes: No results for input(s): CKTOTAL, CKMB, CKMBINDEX, TROPONINI in the last 168 hours. BNP (last 3 results) No results for input(s): PROBNP in the last 8760 hours. HbA1C: No results for input(s): HGBA1C in the last 72 hours. CBG: Recent Labs  Lab 05/14/17 1144 05/14/17 1620 05/14/17 2125 05/15/17 0828 05/16/17 0736  GLUCAP 126* 95 95 122* 117*   Lipid Profile: No results for input(s): CHOL, HDL, LDLCALC, TRIG, CHOLHDL, LDLDIRECT in the last 72 hours. Thyroid Function Tests: No results for input(s): TSH, T4TOTAL, FREET4, T3FREE, THYROIDAB in the last 72 hours. Anemia Panel: No results for input(s): VITAMINB12, FOLATE, FERRITIN, TIBC, IRON, RETICCTPCT in the last 72 hours. Urine analysis:    Component Value Date/Time   COLORURINE YELLOW 05/13/2017 [place] 05/13/2017 1545   LABSPEC 1.024 05/13/2017 1545   PHURINE 5.0 05/13/2017 1545   GLUCOSEU NEGATIVE 05/13/2017 1545   HGBUR SMALL (A) 05/13/2017 1545   BILIRUBINUR NEGATIVE 05/13/2017 1545   BILIRUBINUR neg 08/17/2013 1446   KETONESUR 80 (A) 05/13/2017 1545   PROTEINUR 30 (A) 05/13/2017 1545   UROBILINOGEN 0.2 08/17/2013 1446   UROBILINOGEN 0.2 07/06/2011 1519   NITRITE NEGATIVE 05/13/2017 1545   LEUKOCYTESUR TRACE (A) 05/13/2017 1545   Sepsis Labs: @LABRCNTIP (procalcitonin:4,lacticidven:4)   ) Recent Results (from the past 240 hour(s))  Urine culture     Status: Abnormal   Collection Time: 05/13/17  3:45 PM  Result Value Ref Range Status   Specimen Description URINE, CLEAN CATCH  Final   Special Requests Normal  Final   Culture MULTIPLE SPECIES PRESENT,  SUGGEST RECOLLECTION (A)  Final   Report Status 05/15/2017 FINAL  Final      Radiology Studies: Dg Chest 2 View  Result Date: 05/13/2017 CLINICAL DATA:  Dehydration, nausea, vomiting and diarrhea after taking too much methotrexate. EXAM: CHEST  2 VIEW COMPARISON:  05/28/2016 FINDINGS: The heart size and mediastinal contours are within normal limits. Aortic atherosclerosis at the arch without aneurysmal dilatation. Minimal left basilar atelectasis. Emphysematous hyperinflation of the lungs, upper lobe predominant, left greater than right. No pneumonic consolidation, CHF nor effusion. No pneumothorax. The visualized skeletal structures are unremarkable. IMPRESSION: No active cardiopulmonary disease. Chronic stable emphysematous hyperinflation of the lungs. Electronically Signed   By: Ashley Royalty M.D.   On: 05/13/2017 15:49      Scheduled Meds: . aspirin EC  81 mg Oral Daily  .  atorvastatin  40 mg Oral q morning - 10a  . azelastine  1 spray Each Nare BID  . cholecalciferol  2,000 Units Oral Daily  . folic acid  1 mg Oral Daily  . gabapentin  200 mg Oral BID WC  . gabapentin  300 mg Oral QHS  . hydroxychloroquine  200 mg Oral Daily  . ipratropium-albuterol  3 mL Nebulization BID  . leucovorin (WELLCOVORIN) IV infusion  16 mg Intravenous Q6H  . loratadine  10 mg Oral Daily  . magic mouthwash  15 mL Oral Q6H  . mometasone-formoterol  2 puff Inhalation BID  . pantoprazole  40 mg Oral Daily  . sertraline  100 mg Oral Daily  . tiotropium  18 mcg Inhalation q morning - 10a  . vitamin A  10,000 Units Oral Daily  . vitamin B-12  1,000 mcg Oral Daily  . vitamin E  400 Units Oral Daily   Continuous Infusions:    LOS: 3 days    Time spent: 25 minutes  Greater than 50% of the time spent on counseling and coordinating the care.   Florencia Reasons, MD PhD [facility] Pager [phone]  If 7PM-7AM, please contact night-coverage [email or web address] Password [facility] 05/16/2017, 10:38 AM

## 2017-05-16 NOTE — Progress Notes (Signed)
Per poision control, CBC is recommended. If patient is not having any symptoms, they recommend stopping the Leucovorin. They will back to check on results of the CBC. Will continue to monitor.

## 2017-05-16 NOTE — Care Management Important Message (Signed)
Important Message  Patient Details  Name: [name] MRN: [id number] Date of Birth: 1936/04/08   Medicare Important Message Given:  Yes    Kerin Salen 05/16/2017, 11:52 AMImportant Message  Patient Details  Name: [name] MRN: [id number] Date of Birth: 1935-07-11   Medicare Important Message Given:  Yes    Kerin Salen 05/16/2017, 11:52 AM

## 2017-05-17 LAB — CBC WITH DIFFERENTIAL/PLATELET
BASOS ABS: 0 10*3/uL (ref 0.0–0.1)
BASOS PCT: 0 %
EOS ABS: 0.2 10*3/uL (ref 0.0–0.7)
Eosinophils Relative: 3 %
HCT: 33.2 % — ABNORMAL LOW (ref 36.0–46.0)
HEMOGLOBIN: 10.8 g/dL — AB (ref 12.0–15.0)
LYMPHS ABS: 1.2 10*3/uL (ref 0.7–4.0)
Lymphocytes Relative: 22 %
MCH: 28.6 pg (ref 26.0–34.0)
MCHC: 32.5 g/dL (ref 30.0–36.0)
MCV: 87.8 fL (ref 78.0–100.0)
Monocytes Absolute: 0.5 10*3/uL (ref 0.1–1.0)
Monocytes Relative: 9 %
NEUTROS PCT: 66 %
Neutro Abs: 3.5 10*3/uL (ref 1.7–7.7)
Platelets: 183 10*3/uL (ref 150–400)
RBC: 3.78 MIL/uL — AB (ref 3.87–5.11)
RDW: 13.3 % (ref 11.5–15.5)
WBC: 5.3 10*3/uL (ref 4.0–10.5)

## 2017-05-17 LAB — BASIC METABOLIC PANEL
ANION GAP: 11 (ref 5–15)
BUN: 9 mg/dL (ref 6–20)
CHLORIDE: 95 mmol/L — AB (ref 101–111)
CO2: 34 mmol/L — ABNORMAL HIGH (ref 22–32)
Calcium: 8.6 mg/dL — ABNORMAL LOW (ref 8.9–10.3)
Creatinine, Ser: 0.6 mg/dL (ref 0.44–1.00)
GFR calc Af Amer: >60 mL/min (ref >60)
GFR calc non Af Amer: >60 mL/min (ref >60)
Glucose, Bld: 113 mg/dL — ABNORMAL HIGH (ref 65–99)
POTASSIUM: 3 mmol/L — AB (ref 3.5–5.1)
SODIUM: 140 mmol/L (ref 135–145)

## 2017-05-17 LAB — GLUCOSE, CAPILLARY: Glucose-Capillary: 131 mg/dL — ABNORMAL HIGH (ref 65–99)

## 2017-05-17 LAB — MAGNESIUM: MAGNESIUM: 1.5 mg/dL — AB (ref 1.7–2.4)

## 2017-05-17 MED ORDER — CYANOCOBALAMIN 1000 MCG PO TABS: 1000.0000 ug | ORAL_TABLET | Freq: Every day | ORAL | 0 refills | Status: DC

## 2017-05-17 MED ORDER — MAGNESIUM OXIDE 400 MG PO TABS: 400.0000 mg | ORAL_TABLET | Freq: Every day | ORAL | 0 refills | Status: DC

## 2017-05-17 MED ORDER — POTASSIUM CHLORIDE CRYS ER 20 MEQ PO TBCR: 20.0000 meq | EXTENDED_RELEASE_TABLET | Freq: Every day | ORAL | 0 refills | Status: DC

## 2017-05-17 MED ORDER — CYCLOBENZAPRINE HCL 5 MG PO TABS
5.0000 mg | ORAL_TABLET | Freq: Once | ORAL | Status: AC
  Administered 2017-05-17: 5 mg via ORAL
  Filled 2017-05-17: qty 1

## 2017-05-17 MED ORDER — LIDOCAINE VISCOUS 2 % MT SOLN: 15.0000 mL | Freq: Four times a day (QID) | OROMUCOSAL | 0 refills | Status: DC | PRN

## 2017-05-17 MED ORDER — MAGNESIUM SULFATE 2 GM/50ML IV SOLN
2.0000 g | Freq: Once | INTRAVENOUS | Status: AC
  Administered 2017-05-17: 2 g via INTRAVENOUS
  Filled 2017-05-17: qty 50

## 2017-05-17 MED ORDER — POTASSIUM CHLORIDE CRYS ER 20 MEQ PO TBCR
40.0000 meq | EXTENDED_RELEASE_TABLET | ORAL | Status: AC
  Administered 2017-05-17 (×2): 40 meq via ORAL
  Filled 2017-05-17: qty 2

## 2017-05-17 NOTE — Progress Notes (Signed)
PT Cancellation Note  Patient Details Name: [name] MRN: [id number] DOB: [birth year]   Cancelled Treatment:    Reason Eval/Treat Not Completed: PT screened, no needs identified, will sign off. Spoke with pt who denies need for PT services. She stated she has already been discharged. Will sign off. Thanks.    Weston Anna, MPT Pager: [phone]

## 2017-05-17 NOTE — Progress Notes (Signed)
Pts IV removed with a clean and dry dressing intact. Pt denies pain at the time of d/c with no s/s of distress noted. Pt taken to the main entrance via wheelchair with nursing staff and family present.

## 2017-05-17 NOTE — Progress Notes (Signed)
Date: May 17, 2017 Chart review for discharge needs:  None found for case management. Patient has no questions concerning post hospital care. Heddy Vidana, BSN, RN3, CCM [phone] 

## 2017-05-17 NOTE — Discharge Summary (Signed)
Discharge Summary  [name] [id number]:[id number] DOB: [birth year]  PCP: [name], MD  Admit date: 05/13/2017 Discharge date: 05/17/2017  Time spent: <39mins  Recommendations for Outpatient Follow-up:  1. F/u with PMD within a week  for hospital discharge follow up, repeat cbc/bmp at follow up  Discharge Diagnoses:  Active Hospital Problems   Diagnosis Date Noted  . Stomatitis, ulcerative 05/13/2017  . Methotrexate overdose of undetermined intent 05/13/2017    Resolved Hospital Problems  No resolved problems to display.    Discharge Condition: stable  Diet recommendation: regular diet  Filed Weights   05/13/17 1604 05/17/17 0504  Weight: 79.4 kg (175 lb) 80.5 kg (177 lb 7.5 oz)    History of present illness: (per admitting MD Dr Charlies Silvers) PCP: [name], MD    Patient coming from: home   Chief Complaint: nausea, vomiting  HPI: [name] is a 82 y.o. female with medical history significant for rheumatoid arthritis, COPD, neuropathy, dyslipidemia. She was recently seen in the clinic for RA and was given MTX which she was supposed to take 6 tablets once a week and was given 3 month supply. She however, took 6 tablets every single day. She started to have nausea, vomiting, mouth sores, sore throat for past 5 or so days. She was not able to tolerate any PO intake and maybe has had a banana in last 1 week. No fevers. No chest pain, palpations. No cough. No lightheadedness. No diarrhea or constipation.   ED Course: In ED< pt was afebrile. BP was 81/65 and then 148/79, oxygen saturation 80% but not sure if this was an accurate measurement as subsequent numbers were 96-100%. ED physician contacted poison control and they recommended IV leucovorin 15 mg every 6 hours for 5 days and daily MTX level.     Hospital Course:  Principal Problem:   Stomatitis, ulcerative Active Problems:   Methotrexate overdose of undetermined intent   Methotrexate overdose /  Ulcerative stomatitis  - Pt with unintentional overdose on MTX - Per poison control, supportive care, s/p leucovorin  every 6 hours for 5 days ,  daily MTX level has been less than 0.04. - Continue magic mouthwash with lidocaine - mucositis improved, she is discharged home   Hypokalemia/hypomagnesemia:  Replace k/mag  Dyslipidemia - Continue Lipitor 40 mg at bedtime   Neuropathy - Continue gabapentin   h/o copd /home o2 dependent: - - CXR without acute cardiopulmonary disease - Stable resp status , no wheezing -encourage incentive spirometer    Rheumatoid arthritis  - Continue Plaquenil     Depression -- Continue Zoloft    DVT prophylaxis: SCD's Code Status: full code  Family Communication: no family at the bedside Disposition Plan: home on 1/25  Consultants:   Poison control   Procedures:   None  Antimicrobials:   None   Discharge Exam: BP (!) 116/51 (BP Location: Left Arm)   Pulse 76   Temp 97.9 F (36.6 C) (Oral)   Resp 18   Ht 5' (1.524 m)   Wt 80.5 kg (177 lb 7.5 oz)   SpO2 (!) 86% Comment: Pt placed back on 2 lpm [place]  BMI 34.66 kg/m   General: NAD, mucositis improving Cardiovascular: RRR Respiratory: CTABL  Discharge Instructions You were cared for by a hospitalist during your hospital stay. If you have any questions about your discharge medications or the care you received while you were in the hospital after you are discharged, you can call the unit and asked  to speak with the hospitalist on call if the hospitalist that took care of you is not available. Once you are discharged, your primary care physician will handle any further medical issues. Please note that NO REFILLS for any discharge medications will be authorized once you are discharged, as it is imperative that you return to your primary care physician (or establish a relationship with a primary care physician if you do not have one) for your aftercare needs so that they  can reassess your need for medications and monitor your lab values.  Discharge Instructions    Diet general   Complete by:  As directed    Increase activity slowly   Complete by:  As directed      Allergies as of 05/17/2017      Reactions   Kapidex [dexlansoprazole] Other (See Comments)   Stomach pain, diarrhea   Zegerid [omeprazole] Other (See Comments)   Burning in chest      Medication List    STOP taking these medications   azithromycin 500 MG tablet Commonly known as:  ZITHROMAX   methotrexate 2.5 MG tablet Commonly known as:  RHEUMATREX   predniSONE 20 MG tablet Commonly known as:  DELTASONE     TAKE these medications   albuterol 108 (90 Base) MCG/ACT inhaler Commonly known as:  PROVENTIL HFA;VENTOLIN HFA Inhale 2 puffs into the lungs every 6 (six) hours as needed. For shortness of breath.   albuterol (2.5 MG/3ML) 0.083% nebulizer solution Commonly known as:  PROVENTIL Take 2.5 mg by nebulization 2 (two) times daily as needed. For shortness of breath.   aspirin EC 81 MG tablet Take 81 mg by mouth daily.   atorvastatin 40 MG tablet Commonly known as:  LIPITOR Take 40 mg by mouth every morning.   azelastine 0.1 % nasal spray Commonly known as:  ASTELIN Place 1 spray into the nose 2 (two) times daily.   budesonide-formoterol 160-4.5 MCG/ACT inhaler Commonly known as:  SYMBICORT Inhale 2 puffs into the lungs 2 (two) times daily.   CALCIUM + D PO Take 2 tablets by mouth daily.   carboxymethylcellulose 0.5 % Soln Commonly known as:  REFRESH PLUS 2 drops 2 (two) times daily as needed (dry eye).   cetirizine 10 MG tablet Commonly known as:  ZYRTEC Take 10 mg by mouth every morning.   esomeprazole 40 MG capsule Commonly known as:  NEXIUM Take 40 mg by mouth every morning.   folic acid 1 MG tablet Commonly known as:  FOLVITE Take 1 mg by mouth daily.   gabapentin 100 MG capsule Commonly known as:  NEURONTIN Take 200-300 mg by mouth 3 (three) times  daily. 200 mg every AM and at noon and 300 mg QHS   HYDROcodone-acetaminophen 7.5-325 mg/15 ml solution Commonly known as:  HYCET Take 15 mLs by mouth every 4 (four) hours as needed for moderate pain.   hydroxychloroquine 200 MG tablet Commonly known as:  PLAQUENIL Take 200 mg by mouth daily.   ipratropium-albuterol 0.5-2.5 (3) MG/3ML Soln Commonly known as:  DUONEB Take 3 mLs by nebulization 2 (two) times daily.   lidocaine 2 % solution Commonly known as:  XYLOCAINE Use as directed 15 mLs in the mouth or throat every 6 (six) hours as needed for mouth pain.   magic mouthwash Soln Take 15 mLs by mouth every 6 (six) hours. Swish and spit   magnesium oxide 400 MG tablet Commonly known as:  MAG-OX Take 1 tablet (400 mg total) by mouth daily.  meloxicam 15 MG tablet Commonly known as:  MOBIC Take 15 mg by mouth daily.   ondansetron 4 MG disintegrating tablet Commonly known as:  ZOFRAN-ODT Take 4 mg by mouth every 6 (six) hours as needed for nausea or vomiting.   potassium chloride SA 20 MEQ tablet Commonly known as:  K-DUR,KLOR-CON Take 1 tablet (20 mEq total) by mouth daily for 3 days.   sertraline 100 MG tablet Commonly known as:  ZOLOFT Take 100 mg by mouth daily.   tiotropium 18 MCG inhalation capsule Commonly known as:  SPIRIVA Place 18 mcg into inhaler and inhale every morning.   vitamin A 10000 UNIT capsule Take 10,000 Units by mouth daily.   Vitamin B-12 1000 MCG Subl Place 1 tablet under the tongue daily. What changed:  Another medication with the same name was added. Make sure you understand how and when to take each.   cyanocobalamin 1000 MCG tablet Take 1 tablet (1,000 mcg total) by mouth daily. Start taking on:  05/18/2017 What changed:  You were already taking a medication with the same name, and this prescription was added. Make sure you understand how and when to take each.   Vitamin D 2000 units tablet Take 2,000 Units by mouth daily.   vitamin  E 400 UNIT capsule Take 400 Units by mouth daily.      Allergies  Allergen Reactions  . Kapidex [Dexlansoprazole] Other (See Comments)    Stomach pain, diarrhea  . Zegerid [Omeprazole] Other (See Comments)    Burning in chest   Follow-up Information    [name], MD Follow up in 1 week(s).   Specialty:  Family Medicine Why:  hospital discharge follow up repeat cbc/bmp at follow up.  Contact information: [place] [address] [phone]            The results of significant diagnostics from this hospitalization (including imaging, microbiology, ancillary and laboratory) are listed below for reference.    Significant Diagnostic Studies: Dg Chest 2 View  Result Date: 05/13/2017 CLINICAL DATA:  Dehydration, nausea, vomiting and diarrhea after taking too much methotrexate. EXAM: CHEST  2 VIEW COMPARISON:  05/28/2016 FINDINGS: The heart size and mediastinal contours are within normal limits. Aortic atherosclerosis at the arch without aneurysmal dilatation. Minimal left basilar atelectasis. Emphysematous hyperinflation of the lungs, upper lobe predominant, left greater than right. No pneumonic consolidation, CHF nor effusion. No pneumothorax. The visualized skeletal structures are unremarkable. IMPRESSION: No active cardiopulmonary disease. Chronic stable emphysematous hyperinflation of the lungs. Electronically Signed   By: Ashley Royalty M.D.   On: 05/13/2017 15:49    Microbiology: Recent Results (from the past 240 hour(s))  Urine culture     Status: Abnormal   Collection Time: 05/13/17  3:45 PM  Result Value Ref Range Status   Specimen Description URINE, CLEAN CATCH  Final   Special Requests Normal  Final   Culture MULTIPLE SPECIES PRESENT, SUGGEST RECOLLECTION (A)  Final   Report Status 05/15/2017 FINAL  Final     Labs: Basic Metabolic Panel: Recent Labs  Lab 05/13/17 1354 05/14/17 0618 05/17/17 0602  NA 138 139 140  K 4.3 3.7 3.0*  CL 99*  103 95*  CO2 27 30 34*  GLUCOSE 125* 98 113*  BUN 27* 20 9  CREATININE 0.69 0.55 0.60  CALCIUM 9.3 8.3* 8.6*  MG  --   --  1.5*   Liver Function Tests: Recent Labs  Lab 05/13/17 1354 05/14/17 0618  AST 18 15  ALT 28 19  ALKPHOS 111 82  BILITOT 0.6 0.6  PROT 7.5 6.1*  ALBUMIN 3.8 3.1*   No results for input(s): LIPASE, AMYLASE in the last 168 hours. No results for input(s): AMMONIA in the last 168 hours. CBC: Recent Labs  Lab 05/13/17 1354 05/14/17 0618 05/16/17 1318 05/17/17 0602  WBC 3.6* 5.4 5.6 5.3  NEUTROABS 2.5  --   --  3.5  HGB 13.1 10.9* 10.9* 10.8*  HCT 40.5 34.6* 33.7* 33.2*  MCV 89.2 89.9 87.5 87.8  PLT 234 180 176 183   Cardiac Enzymes: No results for input(s): CKTOTAL, CKMB, CKMBINDEX, TROPONINI in the last 168 hours. BNP: BNP (last 3 results) Recent Labs    05/20/16 1558  BNP 146.3*    ProBNP (last 3 results) No results for input(s): PROBNP in the last 8760 hours.  CBG: Recent Labs  Lab 05/14/17 1620 05/14/17 2125 05/15/17 0828 05/16/17 0736 05/17/17 0753  GLUCAP 95 95 122* 117* 131*       Signed:  Florencia Reasons MD, PhD  [facility] 05/17/2017, 3:16 PM

## 2017-05-20 DIAGNOSIS — M154 Erosive (osteo)arthritis: Secondary | ICD-10-CM | POA: Diagnosis not present

## 2017-05-20 DIAGNOSIS — M150 Primary generalized (osteo)arthritis: Secondary | ICD-10-CM | POA: Diagnosis not present

## 2017-05-20 DIAGNOSIS — M797 Fibromyalgia: Secondary | ICD-10-CM | POA: Diagnosis not present

## 2017-05-20 DIAGNOSIS — M25561 Pain in right knee: Secondary | ICD-10-CM | POA: Diagnosis not present

## 2017-05-20 DIAGNOSIS — M545 Low back pain: Secondary | ICD-10-CM | POA: Diagnosis not present

## 2017-05-20 DIAGNOSIS — M25519 Pain in unspecified shoulder: Secondary | ICD-10-CM | POA: Diagnosis not present

## 2017-05-20 DIAGNOSIS — Z79899 Other long term (current) drug therapy: Secondary | ICD-10-CM | POA: Diagnosis not present

## 2017-05-20 DIAGNOSIS — M0579 Rheumatoid arthritis with rheumatoid factor of multiple sites without organ or systems involvement: Secondary | ICD-10-CM | POA: Diagnosis not present

## 2017-05-20 DIAGNOSIS — M5412 Radiculopathy, cervical region: Secondary | ICD-10-CM | POA: Diagnosis not present

## 2017-05-20 DIAGNOSIS — M2550 Pain in unspecified joint: Secondary | ICD-10-CM | POA: Diagnosis not present

## 2017-05-24 DIAGNOSIS — E876 Hypokalemia: Secondary | ICD-10-CM | POA: Diagnosis not present

## 2017-05-24 DIAGNOSIS — D72819 Decreased white blood cell count, unspecified: Secondary | ICD-10-CM | POA: Diagnosis not present

## 2017-05-24 DIAGNOSIS — T451X5D Adverse effect of antineoplastic and immunosuppressive drugs, subsequent encounter: Secondary | ICD-10-CM | POA: Diagnosis not present

## 2017-05-31 ENCOUNTER — Ambulatory Visit
Admission: RE | Admit: 2017-05-31 | Discharge: 2017-05-31 | Disposition: A | Discharge status: Home / Self Care | Payer: Medicare Other | Source: Ambulatory Visit | Attending: Family Medicine | Admitting: Family Medicine

## 2017-05-31 DIAGNOSIS — Z1231 Encounter for screening mammogram for malignant neoplasm of breast: Secondary | ICD-10-CM | POA: Diagnosis not present

## 2017-06-05 DIAGNOSIS — M5412 Radiculopathy, cervical region: Secondary | ICD-10-CM | POA: Diagnosis not present

## 2017-06-05 DIAGNOSIS — M150 Primary generalized (osteo)arthritis: Secondary | ICD-10-CM | POA: Diagnosis not present

## 2017-06-05 DIAGNOSIS — M154 Erosive (osteo)arthritis: Secondary | ICD-10-CM | POA: Diagnosis not present

## 2017-06-05 DIAGNOSIS — M2550 Pain in unspecified joint: Secondary | ICD-10-CM | POA: Diagnosis not present

## 2017-06-05 DIAGNOSIS — M25519 Pain in unspecified shoulder: Secondary | ICD-10-CM | POA: Diagnosis not present

## 2017-06-05 DIAGNOSIS — M545 Low back pain: Secondary | ICD-10-CM | POA: Diagnosis not present

## 2017-06-05 DIAGNOSIS — M25561 Pain in right knee: Secondary | ICD-10-CM | POA: Diagnosis not present

## 2017-06-05 DIAGNOSIS — Z79899 Other long term (current) drug therapy: Secondary | ICD-10-CM | POA: Diagnosis not present

## 2017-06-05 DIAGNOSIS — M0579 Rheumatoid arthritis with rheumatoid factor of multiple sites without organ or systems involvement: Secondary | ICD-10-CM | POA: Diagnosis not present

## 2017-06-05 DIAGNOSIS — M797 Fibromyalgia: Secondary | ICD-10-CM | POA: Diagnosis not present

## 2017-06-14 DIAGNOSIS — N3946 Mixed incontinence: Secondary | ICD-10-CM | POA: Diagnosis not present

## 2017-06-14 DIAGNOSIS — Z23 Encounter for immunization: Secondary | ICD-10-CM | POA: Diagnosis not present

## 2017-06-14 DIAGNOSIS — E785 Hyperlipidemia, unspecified: Secondary | ICD-10-CM | POA: Diagnosis not present

## 2017-06-14 DIAGNOSIS — R7303 Prediabetes: Secondary | ICD-10-CM | POA: Diagnosis not present

## 2017-06-14 DIAGNOSIS — J449 Chronic obstructive pulmonary disease, unspecified: Secondary | ICD-10-CM | POA: Diagnosis not present

## 2017-06-14 DIAGNOSIS — M0579 Rheumatoid arthritis with rheumatoid factor of multiple sites without organ or systems involvement: Secondary | ICD-10-CM | POA: Diagnosis not present

## 2017-06-14 DIAGNOSIS — E559 Vitamin D deficiency, unspecified: Secondary | ICD-10-CM | POA: Diagnosis not present

## 2017-06-14 DIAGNOSIS — M797 Fibromyalgia: Secondary | ICD-10-CM | POA: Diagnosis not present

## 2017-06-14 DIAGNOSIS — K219 Gastro-esophageal reflux disease without esophagitis: Secondary | ICD-10-CM | POA: Diagnosis not present

## 2017-06-14 DIAGNOSIS — M159 Polyosteoarthritis, unspecified: Secondary | ICD-10-CM | POA: Diagnosis not present

## 2017-06-14 DIAGNOSIS — R0902 Hypoxemia: Secondary | ICD-10-CM | POA: Diagnosis not present

## 2017-06-14 DIAGNOSIS — G4730 Sleep apnea, unspecified: Secondary | ICD-10-CM | POA: Diagnosis not present

## 2017-07-24 DIAGNOSIS — H01001 Unspecified blepharitis right upper eyelid: Secondary | ICD-10-CM | POA: Diagnosis not present

## 2017-07-24 DIAGNOSIS — H04123 Dry eye syndrome of bilateral lacrimal glands: Secondary | ICD-10-CM | POA: Diagnosis not present

## 2017-07-24 DIAGNOSIS — H16213 Exposure keratoconjunctivitis, bilateral: Secondary | ICD-10-CM | POA: Diagnosis not present

## 2017-07-24 DIAGNOSIS — H01004 Unspecified blepharitis left upper eyelid: Secondary | ICD-10-CM | POA: Diagnosis not present

## 2017-07-27 ENCOUNTER — Other Ambulatory Visit: Payer: Self-pay

## 2017-07-27 ENCOUNTER — Encounter (HOSPITAL_COMMUNITY): Payer: Self-pay | Admitting: Emergency Medicine

## 2017-07-27 ENCOUNTER — Emergency Department (HOSPITAL_COMMUNITY): Payer: Medicare Other

## 2017-07-27 ENCOUNTER — Observation Stay (HOSPITAL_BASED_OUTPATIENT_CLINIC_OR_DEPARTMENT_OTHER)
Admission: EM | Admit: 2017-07-27 | Discharge: 2017-07-28 | Disposition: A | Discharge status: Home / Self Care | Payer: Medicare Other | Source: Home / Self Care | Attending: Emergency Medicine | Admitting: Emergency Medicine

## 2017-07-27 DIAGNOSIS — J9611 Chronic respiratory failure with hypoxia: Secondary | ICD-10-CM

## 2017-07-27 DIAGNOSIS — J9602 Acute respiratory failure with hypercapnia: Secondary | ICD-10-CM | POA: Diagnosis not present

## 2017-07-27 DIAGNOSIS — R1110 Vomiting, unspecified: Secondary | ICD-10-CM

## 2017-07-27 DIAGNOSIS — M069 Rheumatoid arthritis, unspecified: Secondary | ICD-10-CM | POA: Insufficient documentation

## 2017-07-27 DIAGNOSIS — J9610 Chronic respiratory failure, unspecified whether with hypoxia or hypercapnia: Secondary | ICD-10-CM | POA: Diagnosis present

## 2017-07-27 DIAGNOSIS — Z7982 Long term (current) use of aspirin: Secondary | ICD-10-CM

## 2017-07-27 DIAGNOSIS — J111 Influenza due to unidentified influenza virus with other respiratory manifestations: Secondary | ICD-10-CM

## 2017-07-27 DIAGNOSIS — Z79899 Other long term (current) drug therapy: Secondary | ICD-10-CM | POA: Insufficient documentation

## 2017-07-27 DIAGNOSIS — E785 Hyperlipidemia, unspecified: Secondary | ICD-10-CM | POA: Diagnosis present

## 2017-07-27 DIAGNOSIS — J441 Chronic obstructive pulmonary disease with (acute) exacerbation: Secondary | ICD-10-CM | POA: Diagnosis not present

## 2017-07-27 DIAGNOSIS — Z87891 Personal history of nicotine dependence: Secondary | ICD-10-CM

## 2017-07-27 DIAGNOSIS — R112 Nausea with vomiting, unspecified: Secondary | ICD-10-CM | POA: Diagnosis not present

## 2017-07-27 DIAGNOSIS — I2510 Atherosclerotic heart disease of native coronary artery without angina pectoris: Secondary | ICD-10-CM | POA: Diagnosis not present

## 2017-07-27 DIAGNOSIS — Z9981 Dependence on supplemental oxygen: Secondary | ICD-10-CM | POA: Diagnosis not present

## 2017-07-27 DIAGNOSIS — J9621 Acute and chronic respiratory failure with hypoxia: Secondary | ICD-10-CM | POA: Diagnosis not present

## 2017-07-27 DIAGNOSIS — J9622 Acute and chronic respiratory failure with hypercapnia: Secondary | ICD-10-CM | POA: Diagnosis not present

## 2017-07-27 DIAGNOSIS — R079 Chest pain, unspecified: Secondary | ICD-10-CM | POA: Diagnosis not present

## 2017-07-27 DIAGNOSIS — J449 Chronic obstructive pulmonary disease, unspecified: Secondary | ICD-10-CM | POA: Diagnosis present

## 2017-07-27 DIAGNOSIS — K219 Gastro-esophageal reflux disease without esophagitis: Secondary | ICD-10-CM | POA: Diagnosis not present

## 2017-07-27 DIAGNOSIS — R0602 Shortness of breath: Secondary | ICD-10-CM

## 2017-07-27 DIAGNOSIS — R05 Cough: Secondary | ICD-10-CM | POA: Diagnosis not present

## 2017-07-27 DIAGNOSIS — R69 Illness, unspecified: Principal | ICD-10-CM

## 2017-07-27 DIAGNOSIS — R197 Diarrhea, unspecified: Secondary | ICD-10-CM

## 2017-07-27 LAB — BASIC METABOLIC PANEL
ANION GAP: 13 (ref 5–15)
BUN: 16 mg/dL (ref 6–20)
CALCIUM: 8.8 mg/dL — AB (ref 8.9–10.3)
CO2: 27 mmol/L (ref 22–32)
Chloride: 97 mmol/L — ABNORMAL LOW (ref 101–111)
Creatinine, Ser: 0.72 mg/dL (ref 0.44–1.00)
GLUCOSE: 144 mg/dL — AB (ref 65–99)
Potassium: 4.2 mmol/L (ref 3.5–5.1)
SODIUM: 137 mmol/L (ref 135–145)

## 2017-07-27 LAB — URINALYSIS, ROUTINE W REFLEX MICROSCOPIC
BILIRUBIN URINE: NEGATIVE
Glucose, UA: NEGATIVE mg/dL
Hgb urine dipstick: NEGATIVE
NITRITE: NEGATIVE
PH: 7 (ref 5.0–8.0)
Protein, ur: NEGATIVE mg/dL
Specific Gravity, Urine: 1.015 (ref 1.005–1.030)

## 2017-07-27 LAB — CBC
HCT: 38.5 % (ref 36.0–46.0)
HEMOGLOBIN: 12.2 g/dL (ref 12.0–15.0)
MCH: 29.4 pg (ref 26.0–34.0)
MCHC: 31.7 g/dL (ref 30.0–36.0)
MCV: 92.8 fL (ref 78.0–100.0)
Platelets: 361 10*3/uL (ref 150–400)
RBC: 4.15 MIL/uL (ref 3.87–5.11)
RDW: 16.2 % — ABNORMAL HIGH (ref 11.5–15.5)
WBC: 21.7 10*3/uL — ABNORMAL HIGH (ref 4.0–10.5)

## 2017-07-27 LAB — INFLUENZA PANEL BY PCR (TYPE A & B)
INFLBPCR: NEGATIVE
Influenza A By PCR: NEGATIVE

## 2017-07-27 LAB — I-STAT CG4 LACTIC ACID, ED
Lactic Acid, Venous: 0.93 mmol/L (ref 0.5–1.9)
Lactic Acid, Venous: 0.95 mmol/L (ref 0.5–1.9)

## 2017-07-27 LAB — URINALYSIS, MICROSCOPIC (REFLEX)
BACTERIA UA: NONE SEEN
RBC / HPF: NONE SEEN RBC/hpf (ref 0–5)
WBC, UA: NONE SEEN WBC/hpf (ref 0–5)

## 2017-07-27 LAB — I-STAT TROPONIN, ED: TROPONIN I, POC: 0 ng/mL (ref 0.00–0.08)

## 2017-07-27 MED ORDER — IPRATROPIUM-ALBUTEROL 0.5-2.5 (3) MG/3ML IN SOLN
3.0000 mL | Freq: Once | RESPIRATORY_TRACT | Status: AC
  Administered 2017-07-27: 3 mL via RESPIRATORY_TRACT
  Filled 2017-07-27: qty 3

## 2017-07-27 MED ORDER — MAGNESIUM SULFATE 50 % IJ SOLN: 2.0000 g | Freq: Once | INTRAMUSCULAR | Status: DC

## 2017-07-27 MED ORDER — IPRATROPIUM-ALBUTEROL 0.5-2.5 (3) MG/3ML IN SOLN: 3.0000 mL | Freq: Four times a day (QID) | RESPIRATORY_TRACT | Status: DC

## 2017-07-27 MED ORDER — MAGNESIUM SULFATE 2 GM/50ML IV SOLN
2.0000 g | Freq: Once | INTRAVENOUS | Status: AC
  Administered 2017-07-27: 2 g via INTRAVENOUS
  Filled 2017-07-27: qty 50

## 2017-07-27 MED ORDER — METHYLPREDNISOLONE SODIUM SUCC 125 MG IJ SOLR
125.0000 mg | Freq: Once | INTRAMUSCULAR | Status: AC
  Administered 2017-07-27: 125 mg via INTRAVENOUS
  Filled 2017-07-27: qty 2

## 2017-07-27 MED ORDER — IPRATROPIUM-ALBUTEROL 0.5-2.5 (3) MG/3ML IN SOLN
3.0000 mL | Freq: Two times a day (BID) | RESPIRATORY_TRACT | Status: DC
  Administered 2017-07-27 – 2017-07-28 (×2): 3 mL via RESPIRATORY_TRACT
  Filled 2017-07-27 (×2): qty 3

## 2017-07-27 MED ORDER — ACETAMINOPHEN 500 MG PO TABS
1000.0000 mg | ORAL_TABLET | Freq: Once | ORAL | Status: AC
  Administered 2017-07-27: 1000 mg via ORAL
  Filled 2017-07-27: qty 2

## 2017-07-27 MED ORDER — SODIUM CHLORIDE 0.9 % IV BOLUS
1000.0000 mL | Freq: Once | INTRAVENOUS | Status: AC
  Administered 2017-07-27: 1000 mL via INTRAVENOUS

## 2017-07-27 MED ORDER — ONDANSETRON HCL 4 MG/2ML IJ SOLN
4.0000 mg | Freq: Once | INTRAMUSCULAR | Status: AC
  Administered 2017-07-27: 4 mg via INTRAVENOUS
  Filled 2017-07-27: qty 2

## 2017-07-27 MED ORDER — SODIUM CHLORIDE 0.9 % IV BOLUS: 500.0000 mL | Freq: Once | INTRAVENOUS | Status: DC

## 2017-07-27 NOTE — ED Notes (Signed)
ED TO INPATIENT HANDOFF REPORT  Name/Age/Gender [name] 82 y.o. female  Code Status Code Status History    Date Active Date Inactive Code Status Order ID Comments User Context   05/13/2017 2011 05/17/2017 2007 Full Code [id number]  Robbie Lis, MD ED   05/20/2016 2217 05/23/2016 1707 Full Code [id number]  Jani Gravel, MD Inpatient   03/25/2015 1054 03/29/2015 2149 Full Code [id number]  Marcellus Scott Inpatient   07/12/2011 1911 07/13/2011 1741 Full Code [id number]  Samuella Cota, MD Inpatient      Home/SNF/Other Home  Chief Complaint vomiting - pain  Level of Care/Admitting Diagnosis ED Disposition    ED Disposition Condition [facility] Area: [facility] [[id number]]  Level of Care: Med-Surg [16]  Diagnosis: COPD with acute exacerbation [facility]) [643329]  Admitting Physician: Cristy Folks [[id number]]  Attending Physician: Cristy Folks [[id number]]  PT Class (Do Not Modify): Observation [104]  PT Acc Code (Do Not Modify): Observation [10022]       Medical History Past Medical History:  Diagnosis Date  . Arthritis   . Bladder neoplasm   . Chronic cough   . Chronic respiratory failure ([place])   . COPD (chronic obstructive pulmonary disease) ([facility])    PULMOLOGIST-  DR [name]--  GOLD III W/  CHRONIC RESPIRATORY FAILURE--  O2 DEPENDENT  . Coronary arteriosclerosis   . Cough productive of purulent sputum   . GERD (gastroesophageal reflux disease)   . Hematuria   . History of nephritis    as child dx w/ Bright's disease (glomerulonephritis)  . Mild obstructive sleep apnea    no cpap recommendation  . Neuropathy, peripheral, idiopathic   . Pelvic pain in female   . PONV (postoperative nausea and vomiting)   . Supplemental oxygen dependent    2L  via Nasal Canula --  activity and nighttime  . Urinary incontinence in female     Allergies Allergies  Allergen Reactions  . Kapidex [Dexlansoprazole] Other (See Comments)    Stomach  pain, diarrhea  . Zegerid [Omeprazole] Other (See Comments)    Burning in chest    IV Location/Drains/Wounds Patient Lines/Drains/Airways Status   Active Line/Drains/Airways    Name:   Placement date:   Placement time:   Site:   Days:   Peripheral IV 07/27/17 Right Antecubital   07/27/17    1459    Antecubital   less than 1          Labs/Imaging Results for orders placed or performed during the hospital encounter of 07/27/17 (from the past 48 hour(s))  Basic metabolic panel     Status: Abnormal   Collection Time: 07/27/17  2:11 PM  Result Value Ref Range   Sodium 137 135 - 145 mmol/L   Potassium 4.2 3.5 - 5.1 mmol/L   Chloride 97 (L) 101 - 111 mmol/L   CO2 27 22 - 32 mmol/L   Glucose, Bld 144 (H) 65 - 99 mg/dL   BUN 16 6 - 20 mg/dL   Creatinine, Ser 0.72 0.44 - 1.00 mg/dL   Calcium 8.8 (L) 8.9 - 10.3 mg/dL   GFR calc non Af Amer >60 >60 mL/min   GFR calc Af Amer >60 >60 mL/min    Comment: (NOTE) The eGFR has been calculated using the CKD EPI equation. This calculation has not been validated in all clinical situations. eGFR's persistently <60 mL/min signify possible Chronic Kidney Disease.    Anion gap 13 5 -  15    Comment: Performed at [facility], [place] [address] [place], [place] [address]  CBC     Status: Abnormal   Collection Time: 07/27/17  2:11 PM  Result Value Ref Range   WBC 21.7 (H) 4.0 - 10.5 K/uL   RBC 4.15 3.87 - 5.11 MIL/uL   Hemoglobin 12.2 12.0 - 15.0 g/dL   HCT 38.5 36.0 - 46.0 %   MCV 92.8 78.0 - 100.0 fL   MCH 29.4 26.0 - 34.0 pg   MCHC 31.7 30.0 - 36.0 g/dL   RDW 16.2 (H) 11.5 - 15.5 %   Platelets 361 150 - 400 K/uL    Comment: Performed at [facility], [place] [address], [place], [place] [address]  I-stat troponin, ED     Status: None   Collection Time: 07/27/17  2:15 PM  Result Value Ref Range   Troponin i, poc 0.00 0.00 - 0.08 ng/mL   Comment 3            Comment: Due to the release kinetics of cTnI, a  negative result within the first hours of the onset of symptoms does not rule out myocardial infarction with certainty. If myocardial infarction is still suspected, repeat the test at appropriate intervals.   I-Stat CG4 Lactic Acid, ED     Status: None   Collection Time: 07/27/17  2:18 PM  Result Value Ref Range   Lactic Acid, Venous 0.93 0.5 - 1.9 mmol/L  Urinalysis, Routine w reflex microscopic     Status: Abnormal   Collection Time: 07/27/17  2:19 PM  Result Value Ref Range   Color, Urine YELLOW YELLOW   APPearance CLEAR CLEAR   Specific Gravity, Urine 1.015 1.005 - 1.030   pH 7.0 5.0 - 8.0   Glucose, UA NEGATIVE NEGATIVE mg/dL   Hgb urine dipstick NEGATIVE NEGATIVE   Bilirubin Urine NEGATIVE NEGATIVE   Ketones, ur TRACE (A) NEGATIVE mg/dL   Protein, ur NEGATIVE NEGATIVE mg/dL   Nitrite NEGATIVE NEGATIVE   Leukocytes, UA TRACE (A) NEGATIVE    Comment: Performed at [facility] [address] [place], [place] [address]  Urinalysis, Microscopic (reflex)     Status: Abnormal   Collection Time: 07/27/17  2:19 PM  Result Value Ref Range   RBC / HPF NONE SEEN 0 - 5 RBC/hpf   WBC, UA NONE SEEN 0 - 5 WBC/hpf   Bacteria, UA NONE SEEN NONE SEEN   Squamous Epithelial / LPF 0-5 (A) NONE SEEN    Comment: Performed at [facility], [place] [address], [place], [place] [address]  Influenza panel by PCR (type A & B)     Status: None   Collection Time: 07/27/17  3:02 PM  Result Value Ref Range   Influenza A By PCR NEGATIVE NEGATIVE   Influenza B By PCR NEGATIVE NEGATIVE    Comment: (NOTE) The Xpert Xpress Flu assay is intended as an aid in the diagnosis of  influenza and should not be used as a sole basis for treatment.  This  assay is FDA approved for nasopharyngeal swab specimens only. Nasal  washings and aspirates are unacceptable for Xpert Xpress Flu testing. Performed at [facility], [place] [address] [place], [place] [address]   I-Stat  CG4 Lactic Acid, ED     Status: None   Collection Time: 07/27/17  4:07 PM  Result Value Ref Range   Lactic Acid, Venous 0.95 0.5 - 1.9 mmol/L  Dg Chest 2 View  Result Date: 07/27/2017 CLINICAL DATA:  Nausea and vomiting.  Productive cough.  Chest pain. EXAM: CHEST - 2 VIEW COMPARISON:  05/13/2017 FINDINGS: Artifact overlies the chest. Heart size is normal. Chronic aortic atherosclerosis. No evidence of infiltrate, effusion or collapse. Ordinary degenerative changes affect the spine. There may be central bronchial thickening. IMPRESSION: Possible bronchitis pattern.  No consolidation or collapse. Electronically Signed   By: Nelson Chimes M.D.   On: 07/27/2017 15:01    Pending Labs Unresulted Labs (From admission, onward)   Start     Ordered   07/27/17 1409  Culture, blood (routine x 2)  BLOOD CULTURE X 2,   STAT     07/27/17 1408      Vitals/Pain Today's Vitals   07/27/17 1357 07/27/17 1358 07/27/17 1626 07/27/17 1812  BP:    129/66  Pulse: (!) 110   (!) 109  Resp: (!) 24   (!) 27  Temp: 99.1 F (37.3 C) 99.2 F (37.3 C)  98.9 F (37.2 C)  TempSrc: Oral Axillary  Oral  SpO2:    91%  Weight:      PainSc:   6  6     Isolation Precautions Droplet precaution  Medications Medications  ipratropium-albuterol (DUONEB) 0.5-2.5 (3) MG/3ML nebulizer solution 3 mL (3 mLs Nebulization Given 07/27/17 1432)  methylPREDNISolone sodium succinate (SOLU-MEDROL) 125 mg/2 mL injection 125 mg (125 mg Intravenous Given 07/27/17 1448)  ondansetron (ZOFRAN) injection 4 mg (4 mg Intravenous Given 07/27/17 1517)  sodium chloride 0.9 % bolus 1,000 mL (0 mLs Intravenous Stopped 07/27/17 1655)  magnesium sulfate IVPB 2 g 50 mL (0 g Intravenous Stopped 07/27/17 1626)  acetaminophen (TYLENOL) tablet 1,000 mg (1,000 mg Oral Given 07/27/17 1549)  ipratropium-albuterol (DUONEB) 0.5-2.5 (3) MG/3ML nebulizer solution 3 mL (3 mLs Nebulization Given 07/27/17 1549)    Mobility walks with device

## 2017-07-27 NOTE — ED Notes (Signed)
Report given to Jocelyn Lamer, requests patient to transport at [place], pt eating dinner at present.

## 2017-07-27 NOTE — H&P (Signed)
History and Physical    [name] WIO:[id number] DOB: 07/26/1935 DOA: 07/27/2017  PCP: [name], MD    Patient coming from: home  Chief Complaint: malaise, SOB, cough, fatigue  HPI: [name] is a 82 y.o. female with medical history significant of GOLD stage III COPD (based on PFTs in 2014) chronically on 2LNC, rheumatoid arthritis, HLD, peripheral neuropathy who comes in with malaise, body aches, fatigue and SOB. Pt reports that for the past week she has been taking care of her son-in-law who has exhibited flu like symptoms for the past week.  Last night patient began to have a sore throat and diffuse body aches.  She had one episode of nonbilious nonbloody emesis and several episodes of diarrhea.  She also began to feel significantly weak and began to have a cough that was productive of green purulent sputum.  She reported subjective fevers, chills.  She also reported increasing shortness of breath and dyspnea on exertion.  She has difficulty taking breaths in.  She did not have any chest pain, abdominal pain, syncope, presyncope, orthopnea, paroxysmal nocturnal dyspnea, lower extremity edema.   ED Course:   In the ED vitals were notable for mild tachycardia and mild tachypnea.  Patient was noted not to be hypoxic or febrile.  Labs are notable for mildly elevated white count of 21.7.  Patient had received IV steroids prior to this.  Patient was given duo nebs.  Chest x-ray showed concern for bronchitis.  Patient was admitted for observation.  Review of Systems: As per HPI otherwise 10 point review of systems negative.    Past Medical History:  Diagnosis Date  . Arthritis   . Bladder neoplasm   . Chronic cough   . Chronic respiratory failure ([place])   . COPD (chronic obstructive pulmonary disease) (HCC)    PULMOLOGIST-  DR [name]--  GOLD III W/  CHRONIC RESPIRATORY FAILURE--  O2 DEPENDENT  . Coronary arteriosclerosis   . Cough productive of purulent sputum   . GERD  (gastroesophageal reflux disease)   . Hematuria   . History of nephritis    as child dx w/ Bright's disease (glomerulonephritis)  . Mild obstructive sleep apnea    no cpap recommendation  . Neuropathy, peripheral, idiopathic   . Pelvic pain in female   . PONV (postoperative nausea and vomiting)   . Supplemental oxygen dependent    2L  via Nasal Canula --  activity and nighttime  . Urinary incontinence in female     Past Surgical History:  Procedure Laterality Date  . APPENDECTOMY  1971  . CARDIAC CATHETERIZATION  05-08-2007  dr Marlou Porch   minor coronary plaquing w/ no significant CAD/  20% mLAD,  10% mRCA,  perserved LV, ef 60-65%  . CATARACT EXTRACTION W/ INTRAOCULAR LENS  IMPLANT, BILATERAL  2001 approx  . CHOLECYSTECTOMY  1988  . CYSTOSCOPY W/ RETROGRADES Bilateral 09/24/2014   Procedure: CYSTOSCOPY WITH RETROGRADE PYELOGRAM;  Surgeon: Festus Aloe, MD;  Location: [facility];  Service: Urology;  Laterality: Bilateral;  . CYSTOSCOPY WITH BIOPSY N/A 09/24/2014   Procedure: CYSTOSCOPY WITH BIOPSY;  Surgeon: Festus Aloe, MD;  Location: [facility];  Service: Urology;  Laterality: N/A;  . ESOPHAGOGASTRODUODENOSCOPY  last one 09-09-2008  . FULGURATION OF BLADDER TUMOR N/A 09/24/2014   Procedure: FULGURATION OF BLADDER TUMOR;  Surgeon: Festus Aloe, MD;  Location: [facility];  Service: Urology;  Laterality: N/A;  . [place]  . POSTERIOR LUMBAR  FUSION  10-07-2007   bilateral laminectomy L4 and bilateral L4-5 diskectomy w/ fusion  . TONSILLECTOMY  as child  . TOTAL ABDOMINAL HYSTERECTOMY W/ BILATERAL SALPINGOOPHORECTOMY  1973  . TRANSTHORACIC ECHOCARDIOGRAM  07-07-2011   normal echo,  ef 65-70%     reports that she quit smoking about 6 years ago. Her smoking use included cigarettes. She has a 180.00 pack-year smoking history. She has never used smokeless tobacco. She reports that she does not drink alcohol or use  drugs.  Allergies  Allergen Reactions  . Kapidex [Dexlansoprazole] Other (See Comments)    Stomach pain, diarrhea  . Zegerid [Omeprazole] Other (See Comments)    Burning in chest    Family History  Problem Relation Age of Onset  . Asthma Maternal Grandmother    Unacceptable: Noncontributory, unremarkable, or negative. Acceptable: Family history reviewed and not pertinent (If you reviewed it)  Prior to Admission medications   Medication Sig Start Date End Date Taking? Authorizing Provider  albuterol (PROVENTIL HFA;VENTOLIN HFA) 108 (90 BASE) MCG/ACT inhaler Inhale 2 puffs into the lungs every 6 (six) hours as needed. For shortness of breath.    [provider]  albuterol (PROVENTIL) (2.5 MG/3ML) 0.083% nebulizer solution Take 2.5 mg by nebulization 2 (two) times daily as needed. For shortness of breath.    [provider]  aspirin EC 81 MG tablet Take 81 mg by mouth daily.    [provider]  atorvastatin (LIPITOR) 40 MG tablet Take 40 mg by mouth every morning.     [provider]  azelastine (ASTELIN) 0.1 % nasal spray Place 1 spray into the nose 2 (two) times daily.    [provider]  budesonide-formoterol (SYMBICORT) 160-4.5 MCG/ACT inhaler Inhale 2 puffs into the lungs 2 (two) times daily.    [provider]  Calcium Carbonate-Vitamin D (CALCIUM + D PO) Take 2 tablets by mouth daily.     [provider]  carboxymethylcellulose (REFRESH PLUS) 0.5 % SOLN 2 drops 2 (two) times daily as needed (dry eye).    [provider]  cetirizine (ZYRTEC) 10 MG tablet Take 10 mg by mouth every morning.     [provider]  Cholecalciferol (VITAMIN D) 2000 UNITS tablet Take 2,000 Units by mouth daily.    [provider]  Cyanocobalamin (VITAMIN B-12) 1000 MCG SUBL Place 1 tablet under the tongue daily.    [provider]  esomeprazole (NEXIUM) 40 MG capsule Take 40 mg by mouth every morning.     [provider]  folic acid (FOLVITE) 1 MG tablet Take 1 mg by mouth daily.     [provider]  gabapentin (NEURONTIN) 100 MG capsule Take 200-300 mg by mouth 3 (three) times daily. 200 mg every AM and at noon and 300 mg QHS    [provider]  hydroxychloroquine (PLAQUENIL) 200 MG tablet Take 200 mg by mouth daily.    [provider]  ipratropium-albuterol (DUONEB) 0.5-2.5 (3) MG/3ML SOLN Take 3 mLs by nebulization 2 (two) times daily.    [provider]  lidocaine (XYLOCAINE) 2 % solution Use as directed 15 mLs in the mouth or throat every 6 (six) hours as needed for mouth pain. 05/17/17   Florencia Reasons, MD  magic mouthwash SOLN Take 15 mLs by mouth every 6 (six) hours. Swish and spit    [provider]  magnesium oxide (MAG-OX) 400 MG tablet Take 1 tablet (400 mg total) by mouth daily. 05/17/17   Erlinda Hong,  Annamaria Boots, MD  meloxicam (MOBIC) 15 MG tablet Take 15 mg by mouth daily. 05/14/16   [provider]  ondansetron (ZOFRAN-ODT) 4 MG disintegrating tablet Take 4 mg by mouth every 6 (six) hours as needed for nausea or vomiting.    [provider]  potassium chloride SA (K-DUR,KLOR-CON) 20 MEQ tablet Take 1 tablet (20 mEq total) by mouth daily for 3 days. 05/17/17 05/20/17  Florencia Reasons, MD  sertraline (ZOLOFT) 100 MG tablet Take 100 mg by mouth daily.    [provider]  tiotropium (SPIRIVA) 18 MCG inhalation capsule Place 18 mcg into inhaler and inhale every morning.     [provider]  vitamin A 10000 UNIT capsule Take 10,000 Units by mouth daily.    [provider]  vitamin B-12 1000 MCG tablet Take 1 tablet (1,000 mcg total) by mouth daily. 05/18/17   Florencia Reasons, MD  vitamin E 400 UNIT capsule Take 400 Units by mouth daily.    [provider]    Physical Exam: Vitals:   07/27/17 1345 07/27/17 1346 07/27/17 1357 07/27/17 1358  BP:      Pulse: (!) 109  (!) 110   Resp:   (!) 24   Temp:   99.1 F (37.3 C)  99.2 F (37.3 C)  TempSrc:   Oral Axillary  SpO2: 91%     Weight:  79.8 kg (176 lb)      Constitutional: NAD, calm, comfortable Vitals:   07/27/17 1345 07/27/17 1346 07/27/17 1357 07/27/17 1358  BP:      Pulse: (!) 109  (!) 110   Resp:   (!) 24   Temp:   99.1 F (37.3 C) 99.2 F (37.3 C)  TempSrc:   Oral Axillary  SpO2: 91%     Weight:  79.8 kg (176 lb)     Eyes: Anicteric sclera ENMT: Dry mucous membranes Neck: normal, supple, Respiratory: Faint wheezes bilaterally, scattered rhonchi, mild increased work of breathing, no crackles.  Cardiovascular: Tachycardia, regular, distant heart sounds, no murmurs Abdomen: no tenderness, no masses palpated. No hepatosplenomegaly. Bowel sounds positive.  Musculoskeletal: No lower extremity edema Skin: no rashes on visible skin Neurologic: Fully intact, moving all extremities Psychiatric: Normal judgment and insight. Alert and oriented x 3. Normal mood.   (Anything < 9 systems with 2 bullets each down codes to level 1) (If patient refuses exam can't bill higher level) (Make sure to document decubitus ulcers present on admission -- if possible -- and whether patient has chronic indwelling catheter at time of admission)  Labs on Admission: I have personally reviewed following labs and imaging studies  CBC: Recent Labs  Lab 07/27/17 1411  WBC 21.7*  HGB 12.2  HCT 38.5  MCV 92.8  PLT 244   Basic Metabolic Panel: Recent Labs  Lab 07/27/17 1411  NA 137  K 4.2  CL 97*  CO2 27  GLUCOSE 144*  BUN 16  CREATININE 0.72  CALCIUM 8.8*   GFR: Estimated Creatinine Clearance: 53.3 mL/min (by C-G formula based on SCr of 0.72 mg/dL). Liver Function Tests: No results for input(s): AST, ALT, ALKPHOS, BILITOT, PROT, ALBUMIN in the last 168 hours. No results for input(s): LIPASE, AMYLASE in the last 168 hours. No results for input(s): AMMONIA in the last 168 hours. Coagulation Profile: No results for input(s): INR, PROTIME in the last  168 hours. Cardiac Enzymes: No results for input(s): CKTOTAL, CKMB, CKMBINDEX, TROPONINI in the last 168 hours. BNP (last 3 results) No results for  input(s): PROBNP in the last 8760 hours. HbA1C: No results for input(s): HGBA1C in the last 72 hours. CBG: No results for input(s): GLUCAP in the last 168 hours. Lipid Profile: No results for input(s): CHOL, HDL, LDLCALC, TRIG, CHOLHDL, LDLDIRECT in the last 72 hours. Thyroid Function Tests: No results for input(s): TSH, T4TOTAL, FREET4, T3FREE, THYROIDAB in the last 72 hours. Anemia Panel: No results for input(s): VITAMINB12, FOLATE, FERRITIN, TIBC, IRON, RETICCTPCT in the last 72 hours. Urine analysis:    Component Value Date/Time   COLORURINE YELLOW 07/27/2017 1419   APPEARANCEUR CLEAR 07/27/2017 1419   LABSPEC 1.015 07/27/2017 1419   PHURINE 7.0 07/27/2017 1419   GLUCOSEU NEGATIVE 07/27/2017 1419   HGBUR NEGATIVE 07/27/2017 1419   BILIRUBINUR NEGATIVE 07/27/2017 1419   BILIRUBINUR neg 08/17/2013 1446   KETONESUR TRACE (A) 07/27/2017 1419   PROTEINUR NEGATIVE 07/27/2017 1419   UROBILINOGEN 0.2 08/17/2013 1446   UROBILINOGEN 0.2 07/06/2011 1519   NITRITE NEGATIVE 07/27/2017 1419   LEUKOCYTESUR TRACE (A) 07/27/2017 1419    Radiological Exams on Admission: Dg Chest 2 View  Result Date: 07/27/2017 CLINICAL DATA:  Nausea and vomiting.  Productive cough.  Chest pain. EXAM: CHEST - 2 VIEW COMPARISON:  05/13/2017 FINDINGS: Artifact overlies the chest. Heart size is normal. Chronic aortic atherosclerosis. No evidence of infiltrate, effusion or collapse. Ordinary degenerative changes affect the spine. There may be central bronchial thickening. IMPRESSION: Possible bronchitis pattern.  No consolidation or collapse. Electronically Signed   By: Nelson Chimes M.D.   On: 07/27/2017 15:01    EKG: Independently reviewed.  Sinus tachycardia, no acute ST segment changes, unchanged from prior  Assessment/Plan Active Problems:   HLD  (hyperlipidemia)   Stage 3 severe COPD by GOLD classification ([place])   COPD with acute exacerbation (HCC)   Chronic respiratory failure (HCC)   #) Gold stage III COPD with acute exacerbation: Patient has very mild exacerbation of COPD with increased sputum production and cough likely in the setting of viral URI.  Flu panel is negative. -Oral prednisone 40 mg daily -Scheduled bronchodilators 4 times daily -Continue L ABA/ICS, LAMA - Oral azithromycin  #) Rheumatoid arthritis: - Continue hydroxychloroquine 200 mg daily next line- continue folic acid 1 mg daily  #) Hyperlipidemia: -Continue aspirin 81 mg -Continue atorvastatin 40 mg daily  #) Neuropathy: -Continue gabapentin 200 mg every morning and noon and 300 mg nightly  #) Pain/psych: -Continue sertraline 100 mg daily  #) Numerous numerous vitamins: Unclear why patient is on these.   -continue calcium carbonate -vitamin D supplements -Continue cholecalciferol 2000 units daily -Continue vitamin A 10,000 units daily -Continue vitamin B12 1000 mcg daily -Continue vitamin D 400 units daily  Fluids: Tolerating p.o. Elect lites: Monitor and supplement Nutrition: Heart healthy diet  Prophylaxis: Enoxaparin  Disposition: Pending improvement in respiratory status and observation overnight  Full code excellent Cristy Folks MD [facility]   If 7PM-7AM, please contact night-coverage [email or web address] Password [facility]  07/27/2017, 5:36 PM

## 2017-07-27 NOTE — ED Triage Notes (Signed)
Pt reports nv, productive cough, chest pain, generalized body aches since this am. O2 sat 86%, increased to 91 % on 2 l [place]. Resp 24. Lunngs clear, diminished in bases.Pt is c/o weakness and was transported by wheelchair triage. Family at the bedside. [pt is alert, oriented and appropriate

## 2017-07-27 NOTE — ED Provider Notes (Signed)
[facility] DEPT Provider Note   CSN: [id number] Arrival date & time: 07/27/17  1313     History   Chief Complaint Chief Complaint  Patient presents with  . Fever  . Nausea  . Shortness of Breath  . Generalized Body Aches    HPI [name] is a 82 y.o. female with past medical history of COPD on 2 L of home O2 for activity and nighttime, sleep apnea who presents emergency department today for fever, body aches, cough, nausea/vomiting/diarrhea.  Patient is present with her daughter and niece to help provide history.  Patient states that her son-in-law has been sick with the flu over the last 2 weeks.  She has been helping taking care of him.  She notes that when she awoke this morning she had subjective fevers, generalized headache without neurologic symptoms, right ear pain, shortness of breath, productive cough with green sputum, chest tightness, constant nausea as well as emesis and diarrhea.  She reports that she has had 4 episodes of nonbilious, nonbloody emesis with the last episode occurring 1 hour ago.  She also reports that she has had 6 episodes of non-melanous, nonbloody diarrhea since 2 AM this morning.  She reports that there is no associated abdominal pain or cramping with this.  She has taken Tylenol prior to arrival.  Patient reports she did consider pneumonia and influenza vaccine this year.  She has been taking her home medications as prescribed.  She has not take her COPD medications this morning however given her new symptoms. Patient states she was in good state of health yesterday without any complaints.   HPI  Past Medical History:  Diagnosis Date  . Arthritis   . Bladder neoplasm   . Chronic cough   . Chronic respiratory failure ([place])   . COPD (chronic obstructive pulmonary disease) (HCC)    PULMOLOGIST-  DR [name]--  GOLD III W/  CHRONIC RESPIRATORY FAILURE--  O2 DEPENDENT  . Coronary arteriosclerosis   . Cough productive of  purulent sputum   . GERD (gastroesophageal reflux disease)   . Hematuria   . History of nephritis    as child dx w/ Bright's disease (glomerulonephritis)  . Mild obstructive sleep apnea    no cpap recommendation  . Neuropathy, peripheral, idiopathic   . Pelvic pain in female   . PONV (postoperative nausea and vomiting)   . Supplemental oxygen dependent    2L  via Nasal Canula --  activity and nighttime  . Urinary incontinence in female     Patient Active Problem List   Diagnosis Date Noted  . Methotrexate overdose of undetermined intent 05/13/2017  . Stomatitis, ulcerative 05/13/2017  . COPD exacerbation ([place]) 05/20/2016  . Anemia 05/20/2016  . Hyperglycemia 05/20/2016  . Pneumonia 03/27/2015  . Malaise   . Emesis   . Shoulder impingement syndrome 03/25/2015  . Cough 08/15/2012  . Chronic respiratory failure ([place]) 04/10/2012  . Constipation 07/03/2011  . Nicotine dependence 07/01/2011  . COPD with acute exacerbation ([place]) 06/29/2011  . COPD GOLD III 06/19/2007  . HYPERLIPIDEMIA 05/15/2007  . ACID REFLUX DISEASE 05/15/2007    Past Surgical History:  Procedure Laterality Date  . APPENDECTOMY  1971  . CARDIAC CATHETERIZATION  05-08-2007  dr Marlou Porch   minor coronary plaquing w/ no significant CAD/  20% mLAD,  10% mRCA,  perserved LV, ef 60-65%  . CATARACT EXTRACTION W/ INTRAOCULAR LENS  IMPLANT, BILATERAL  2001 approx  . CHOLECYSTECTOMY  1988  .  CYSTOSCOPY W/ RETROGRADES Bilateral 09/24/2014   Procedure: CYSTOSCOPY WITH RETROGRADE PYELOGRAM;  Surgeon: Festus Aloe, MD;  Location: [facility];  Service: Urology;  Laterality: Bilateral;  . CYSTOSCOPY WITH BIOPSY N/A 09/24/2014   Procedure: CYSTOSCOPY WITH BIOPSY;  Surgeon: Festus Aloe, MD;  Location: [facility];  Service: Urology;  Laterality: N/A;  . ESOPHAGOGASTRODUODENOSCOPY  last one 09-09-2008  . FULGURATION OF BLADDER TUMOR N/A 09/24/2014   Procedure: FULGURATION OF BLADDER TUMOR;   Surgeon: Festus Aloe, MD;  Location: [facility];  Service: Urology;  Laterality: N/A;  . [place]  . POSTERIOR LUMBAR FUSION  10-07-2007   bilateral laminectomy L4 and bilateral L4-5 diskectomy w/ fusion  . TONSILLECTOMY  as child  . TOTAL ABDOMINAL HYSTERECTOMY W/ BILATERAL SALPINGOOPHORECTOMY  1973  . TRANSTHORACIC ECHOCARDIOGRAM  07-07-2011   normal echo,  ef 65-70%     OB History   None      Home Medications    Prior to Admission medications   Medication Sig Start Date End Date Taking? Authorizing Provider  albuterol (PROVENTIL HFA;VENTOLIN HFA) 108 (90 BASE) MCG/ACT inhaler Inhale 2 puffs into the lungs every 6 (six) hours as needed. For shortness of breath.    [provider]  albuterol (PROVENTIL) (2.5 MG/3ML) 0.083% nebulizer solution Take 2.5 mg by nebulization 2 (two) times daily as needed. For shortness of breath.    [provider]  aspirin EC 81 MG tablet Take 81 mg by mouth daily.    [provider]  atorvastatin (LIPITOR) 40 MG tablet Take 40 mg by mouth every morning.     [provider]  azelastine (ASTELIN) 0.1 % nasal spray Place 1 spray into the nose 2 (two) times daily.    [provider]  budesonide-formoterol (SYMBICORT) 160-4.5 MCG/ACT inhaler Inhale 2 puffs into the lungs 2 (two) times daily.    [provider]  Calcium Carbonate-Vitamin D (CALCIUM + D PO) Take 2 tablets by mouth daily.     [provider]  carboxymethylcellulose (REFRESH PLUS) 0.5 % SOLN 2 drops 2 (two) times daily as needed (dry eye).    [provider]  cetirizine (ZYRTEC) 10 MG tablet Take 10 mg by mouth every morning.     [provider]  Cholecalciferol (VITAMIN D) 2000 UNITS tablet Take 2,000 Units by mouth daily.    [provider]  Cyanocobalamin (VITAMIN B-12) 1000 MCG SUBL Place 1 tablet under the tongue daily.    [provider]  esomeprazole  (NEXIUM) 40 MG capsule Take 40 mg by mouth every morning.    [provider]  folic acid (FOLVITE) 1 MG tablet Take 1 mg by mouth daily.     [provider]  gabapentin (NEURONTIN) 100 MG capsule Take 200-300 mg by mouth 3 (three) times daily. 200 mg every AM and at noon and 300 mg QHS    [provider]  hydroxychloroquine (PLAQUENIL) 200 MG tablet Take 200 mg by mouth daily.    [provider]  ipratropium-albuterol (DUONEB) 0.5-2.5 (3) MG/3ML SOLN Take 3 mLs by nebulization 2 (two) times daily.    [provider]  lidocaine (XYLOCAINE) 2 % solution Use as directed 15 mLs in the mouth or throat every 6 (six) hours as needed for mouth pain. 05/17/17   Florencia Reasons, MD  magic mouthwash SOLN Take 15 mLs by mouth every 6 (six) hours. Swish and spit    [provider]  magnesium  oxide (MAG-OX) 400 MG tablet Take 1 tablet (400 mg total) by mouth daily. 05/17/17   Florencia Reasons, MD  meloxicam (MOBIC) 15 MG tablet Take 15 mg by mouth daily. 05/14/16   [provider]  ondansetron (ZOFRAN-ODT) 4 MG disintegrating tablet Take 4 mg by mouth every 6 (six) hours as needed for nausea or vomiting.    [provider]  potassium chloride SA (K-DUR,KLOR-CON) 20 MEQ tablet Take 1 tablet (20 mEq total) by mouth daily for 3 days. 05/17/17 05/20/17  Florencia Reasons, MD  sertraline (ZOLOFT) 100 MG tablet Take 100 mg by mouth daily.    [provider]  tiotropium (SPIRIVA) 18 MCG inhalation capsule Place 18 mcg into inhaler and inhale every morning.     [provider]  vitamin A 10000 UNIT capsule Take 10,000 Units by mouth daily.    [provider]  vitamin B-12 1000 MCG tablet Take 1 tablet (1,000 mcg total) by mouth daily. 05/18/17   Florencia Reasons, MD  vitamin E 400 UNIT capsule Take 400 Units by mouth daily.    [provider]    Family History Family History  Problem Relation Age of Onset  . Asthma Maternal Grandmother      Social History Social History   Tobacco Use  . Smoking status: Former Smoker    Packs/day: 3.00    Years: 60.00    Pack years: 180.00    Types: Cigarettes    Last attempt to quit: 06/25/2011    Years since quitting: 6.0  . Smokeless tobacco: Never Used  Substance Use Topics  . Alcohol use: No  . Drug use: No     Allergies   Kapidex [dexlansoprazole] and Zegerid [omeprazole]   Review of Systems Review of Systems  All other systems reviewed and are negative.    Physical Exam Updated Vital Signs BP 140/68 (BP Location: Right Arm)   Pulse (!) 110   Temp 99.2 F (37.3 C) (Axillary)   Resp (!) 24   Wt 79.8 kg (176 lb)   SpO2 91%   BMI 34.37 kg/m   Physical Exam  Constitutional: She appears well-developed and well-nourished. She is not intubated.  Elderly female appears stated age  HENT:  Head: Normocephalic and atraumatic.  Right Ear: Tympanic membrane, external ear and ear canal normal. No mastoid tenderness.  Left Ear: Tympanic membrane, external ear and ear canal normal. No mastoid tenderness.  Nose: Mucosal edema present. Right sinus exhibits no maxillary sinus tenderness and no frontal sinus tenderness. Left sinus exhibits no maxillary sinus tenderness and no frontal sinus tenderness.  Mouth/Throat: Uvula is midline and oropharynx is clear and moist. Mucous membranes are dry. She does not have dentures. Normal dentition. No dental caries. No oropharyngeal exudate or posterior oropharyngeal edema. No tonsillar exudate.  No mastoid tenderness, erythema, swelling or bogginess.  No obliteration of postauricular crease.  Eyes: Pupils are equal, round, and reactive to light. Right eye exhibits no discharge. Left eye exhibits no discharge. No scleral icterus.  Neck: Trachea normal. Neck supple. No JVD present. No spinous process tenderness present. Carotid bruit is not present. No neck rigidity. Normal range of motion present.  No nuchal rigidity or meningismus   Cardiovascular: Normal rate, regular rhythm and intact distal pulses.  No murmur heard. Pulses:      Radial pulses are 2+ on the right side, and 2+ on the left side.       Dorsalis pedis pulses are 2+ on the right side, and  2+ on the left side.       Posterior tibial pulses are 2+ on the right side, and 2+ on the left side.  No lower extremity swelling or edema. Calves symmetric in size bilaterally.  Pulmonary/Chest: Effort normal. No accessory muscle usage. Tachypnea noted. She is not intubated. No respiratory distress. She has decreased breath sounds in the right lower field and the left lower field. She has wheezes (Expiratory-upper and middle lung fields). She exhibits no tenderness.  Patient on 2 L of O2 currently satting at 93% with good waveform.  Patient is sitting upright, speaking in full sentences  Abdominal: Soft. Bowel sounds are normal. She exhibits no distension and no ascites. There is no tenderness. There is no rigidity, no rebound, no guarding and no CVA tenderness.  Abdomen is soft, nontender without rigidity, rebound or guarding.  No peritoneal signs.  Musculoskeletal: She exhibits no edema.  Lymphadenopathy:    She has no cervical adenopathy.  Neurological: She is alert.  Speech clear. Follows commands. No facial droop. PERRLA. EOM grossly intact. CN III-XII grossly intact. Grossly moves all extremities 4 without ataxia. Able and appropriate strength for age to upper and lower extremities bilaterally. Slow but able gait.   Skin: Skin is warm, dry and intact. Capillary refill takes less than 2 seconds. No purpura and no rash noted. She is not diaphoretic. No erythema.  Psychiatric: She has a normal mood and affect.  Nursing note and vitals reviewed.    ED Treatments / Results  Labs (all labs ordered are listed, but only abnormal results are displayed) Labs Reviewed  CULTURE, BLOOD (ROUTINE X 2)  CULTURE, BLOOD (ROUTINE X 2)  BASIC METABOLIC PANEL  CBC  URINALYSIS,  ROUTINE W REFLEX MICROSCOPIC  INFLUENZA PANEL BY PCR (TYPE A & B)  I-STAT TROPONIN, ED  I-STAT CG4 LACTIC ACID, ED    EKG EKG Interpretation  Date/Time:  Saturday July 27 2017 13:53:52 EDT Ventricular Rate:  114 PR Interval:    QRS Duration: 80 QT Interval:  315 QTC Calculation: 434 R Axis:   18 Text Interpretation:  Sinus tachycardia RSR' in V1 or V2, right VCD or RVH No significant change since last tracing Confirmed by Blanchie Dessert [id number]) on 07/27/2017 2:16:42 PM   Radiology Dg Chest 2 View  Result Date: 07/27/2017 CLINICAL DATA:  Nausea and vomiting.  Productive cough.  Chest pain. EXAM: CHEST - 2 VIEW COMPARISON:  05/13/2017 FINDINGS: Artifact overlies the chest. Heart size is normal. Chronic aortic atherosclerosis. No evidence of infiltrate, effusion or collapse. Ordinary degenerative changes affect the spine. There may be central bronchial thickening. IMPRESSION: Possible bronchitis pattern.  No consolidation or collapse. Electronically Signed   By: Nelson Chimes M.D.   On: 07/27/2017 15:01    Procedures Procedures (including critical care time)  Medications Ordered in ED Medications  ipratropium-albuterol (DUONEB) 0.5-2.5 (3) MG/3ML nebulizer solution 3 mL (3 mLs Nebulization Given 07/27/17 1432)  methylPREDNISolone sodium succinate (SOLU-MEDROL) 125 mg/2 mL injection 125 mg (125 mg Intravenous Given 07/27/17 1448)  ondansetron (ZOFRAN) injection 4 mg (4 mg Intravenous Given 07/27/17 1517)  sodium chloride 0.9 % bolus 1,000 mL (0 mLs Intravenous Stopped 07/27/17 1655)  magnesium sulfate IVPB 2 g 50 mL (0 g Intravenous Stopped 07/27/17 1626)  acetaminophen (TYLENOL) tablet 1,000 mg (1,000 mg Oral Given 07/27/17 1549)  ipratropium-albuterol (DUONEB) 0.5-2.5 (3) MG/3ML nebulizer solution 3 mL (3 mLs Nebulization Given 07/27/17 1549)     Initial Impression / Assessment and Plan / ED Course  I have reviewed the triage vital signs and the nursing notes.  Pertinent labs & imaging  results that were available during my care of the patient were reviewed by me and considered in my medical decision making (see chart for details).     82 y.o. female presenting with flu like symptoms after She notes that when she awoke this morning she had subjective fevers, generalized headache without neurologic symptoms, right ear pain, shortness of breath, productive cough with green sputum, chest tightness, constant nausea as well as emesis and diarrhea.  On arrival patient was noted to have an oxygen saturation of 86%.  This improved after being placed on 2 L of O2.  Patient is on home O2 for activity and at nighttime chronically.  Patient also noted to be tachycardic at 113. No evidence of sepsis.  Screening labs for sepsis ordered.  Patient without evidence of hypertension that would require emergent call of sepsis at this time.  Patient does note to have wheezing on exam.  Will give IV Solu-Medrol and breathing treatment.  She has had some increased work of breathing on exam but is protecting her airway.  Will also obtain flu swab.  Lactic acid 0.95.  Do not suspect sepsis.  Patient with a leukocytosis of 21.7.  No significant electrolyte derangements.  Kidney function within normal limits.  No anion gap acidosis.  Troponin 0.00.  EKG was sinus tachycardia.  No significant change from prior.  Patient chest x-ray with bronchitis pattern.  No consolidation or collapse.  No evidence of pneumonia.  Urinalysis with trace leukocytes, however she is a contaminated catch with 0-5 squamous epithelial cells.  No bacteria or white blood cells.  Do not suspect UTI.  Patient received multiple DuoNeb's as well as IV Solu-Medrol in the department.  Breathing back at baseline but due to increased work of breathing on presentation feel patient will need admission for observation.  Flu and blood cultures pending.  Patient appears safe for admission.  [facility] hospitalist to admit the patient.  Family in agreement  with plan.  Patient case seen and discussed with Dr. Maryan Rued who is in agreement with plan.   Final Clinical Impressions(s) / ED Diagnoses   Final diagnoses:  Influenza-like illness  COPD exacerbation ([place])  Nausea vomiting and diarrhea  Shortness of breath    ED Discharge Orders    None       Lorelle Gibbs 07/27/17 2148    Blanchie Dessert, MD 07/28/17 2041

## 2017-07-28 DIAGNOSIS — R112 Nausea with vomiting, unspecified: Secondary | ICD-10-CM | POA: Diagnosis not present

## 2017-07-28 DIAGNOSIS — R0602 Shortness of breath: Secondary | ICD-10-CM | POA: Diagnosis not present

## 2017-07-28 DIAGNOSIS — R197 Diarrhea, unspecified: Secondary | ICD-10-CM | POA: Diagnosis not present

## 2017-07-28 DIAGNOSIS — J441 Chronic obstructive pulmonary disease with (acute) exacerbation: Secondary | ICD-10-CM | POA: Diagnosis not present

## 2017-07-28 MED ORDER — ESOMEPRAZOLE MAGNESIUM 40 MG PO CPDR: 40.0000 mg | DELAYED_RELEASE_CAPSULE | Freq: Every morning | ORAL | 2 refills | Status: DC

## 2017-07-28 MED ORDER — CALCIUM CARBONATE-VITAMIN D 500-200 MG-UNIT PO TABS
1.0000 | ORAL_TABLET | Freq: Every day | ORAL | Status: DC
  Administered 2017-07-28: 1 via ORAL
  Filled 2017-07-28: qty 1

## 2017-07-28 MED ORDER — PREDNISONE 20 MG PO TABS
40.0000 mg | ORAL_TABLET | Freq: Every day | ORAL | Status: DC
  Administered 2017-07-28: 40 mg via ORAL
  Filled 2017-07-28: qty 2

## 2017-07-28 MED ORDER — POLYVINYL ALCOHOL 1.4 % OP SOLN: 2.0000 [drp] | Freq: Two times a day (BID) | OPHTHALMIC | Status: DC | PRN

## 2017-07-28 MED ORDER — MOMETASONE FURO-FORMOTEROL FUM 200-5 MCG/ACT IN AERO
2.0000 | INHALATION_SPRAY | Freq: Two times a day (BID) | RESPIRATORY_TRACT | Status: DC
  Administered 2017-07-28: 2 via RESPIRATORY_TRACT
  Filled 2017-07-28: qty 8.8

## 2017-07-28 MED ORDER — AZITHROMYCIN 250 MG PO TABS: 250.0000 mg | ORAL_TABLET | Freq: Every day | ORAL | Status: DC

## 2017-07-28 MED ORDER — HYDROXYCHLOROQUINE SULFATE 200 MG PO TABS
200.0000 mg | ORAL_TABLET | Freq: Every day | ORAL | Status: DC
  Administered 2017-07-28: 200 mg via ORAL
  Filled 2017-07-28: qty 1

## 2017-07-28 MED ORDER — VITAMIN A 10000 UNITS PO CAPS
10000.0000 [IU] | ORAL_CAPSULE | Freq: Every day | ORAL | Status: DC
  Administered 2017-07-28: 10000 [IU] via ORAL
  Filled 2017-07-28: qty 1

## 2017-07-28 MED ORDER — ACETAMINOPHEN 650 MG RE SUPP: 650.0000 mg | Freq: Four times a day (QID) | RECTAL | Status: DC | PRN

## 2017-07-28 MED ORDER — ONDANSETRON HCL 4 MG/2ML IJ SOLN: 4.0000 mg | Freq: Four times a day (QID) | INTRAMUSCULAR | Status: DC | PRN

## 2017-07-28 MED ORDER — IPRATROPIUM-ALBUTEROL 0.5-2.5 (3) MG/3ML IN SOLN: 3.0000 mL | Freq: Four times a day (QID) | RESPIRATORY_TRACT | Status: DC

## 2017-07-28 MED ORDER — POLYETHYLENE GLYCOL 3350 17 G PO PACK: 17.0000 g | PACK | Freq: Every day | ORAL | Status: DC | PRN

## 2017-07-28 MED ORDER — VITAMIN B-12 1000 MCG PO TABS
2000.0000 ug | ORAL_TABLET | Freq: Every day | ORAL | Status: DC
  Administered 2017-07-28: 2000 ug via ORAL
  Filled 2017-07-28: qty 2

## 2017-07-28 MED ORDER — AZITHROMYCIN 250 MG PO TABS: ORAL_TABLET | ORAL | 0 refills | Status: DC

## 2017-07-28 MED ORDER — GUAIFENESIN ER 600 MG PO TB12: 600.0000 mg | ORAL_TABLET | Freq: Two times a day (BID) | ORAL | 0 refills | Status: DC

## 2017-07-28 MED ORDER — TIOTROPIUM BROMIDE MONOHYDRATE 18 MCG IN CAPS
18.0000 ug | ORAL_CAPSULE | Freq: Every morning | RESPIRATORY_TRACT | Status: DC
  Filled 2017-07-28: qty 5

## 2017-07-28 MED ORDER — SERTRALINE HCL 100 MG PO TABS
100.0000 mg | ORAL_TABLET | Freq: Every day | ORAL | Status: DC
  Administered 2017-07-28: 100 mg via ORAL
  Filled 2017-07-28: qty 1

## 2017-07-28 MED ORDER — ENOXAPARIN SODIUM 40 MG/0.4ML ~~LOC~~ SOLN
40.0000 mg | SUBCUTANEOUS | Status: DC
  Administered 2017-07-28: 40 mg via SUBCUTANEOUS
  Filled 2017-07-28: qty 0.4

## 2017-07-28 MED ORDER — KETOROLAC TROMETHAMINE 15 MG/ML IJ SOLN
15.0000 mg | Freq: Four times a day (QID) | INTRAMUSCULAR | Status: DC | PRN
  Administered 2017-07-28: 15 mg via INTRAVENOUS
  Filled 2017-07-28: qty 1

## 2017-07-28 MED ORDER — ONDANSETRON HCL 4 MG PO TABS: 4.0000 mg | ORAL_TABLET | Freq: Four times a day (QID) | ORAL | Status: DC | PRN

## 2017-07-28 MED ORDER — GABAPENTIN 300 MG PO CAPS: 300.0000 mg | ORAL_CAPSULE | Freq: Every day | ORAL | Status: DC

## 2017-07-28 MED ORDER — ATORVASTATIN CALCIUM 40 MG PO TABS: 40.0000 mg | ORAL_TABLET | Freq: Every day | ORAL | Status: DC

## 2017-07-28 MED ORDER — VITAMIN E 180 MG (400 UNIT) PO CAPS
400.0000 [IU] | ORAL_CAPSULE | Freq: Every day | ORAL | Status: DC
  Administered 2017-07-28: 400 [IU] via ORAL
  Filled 2017-07-28: qty 1

## 2017-07-28 MED ORDER — LORATADINE 10 MG PO TABS
10.0000 mg | ORAL_TABLET | Freq: Every day | ORAL | Status: DC
  Administered 2017-07-28: 10 mg via ORAL
  Filled 2017-07-28: qty 1

## 2017-07-28 MED ORDER — VITAMIN D 1000 UNITS PO TABS
2000.0000 [IU] | ORAL_TABLET | Freq: Every day | ORAL | Status: DC
  Administered 2017-07-28: 2000 [IU] via ORAL
  Filled 2017-07-28: qty 2

## 2017-07-28 MED ORDER — AZELASTINE HCL 0.1 % NA SOLN
1.0000 | Freq: Two times a day (BID) | NASAL | Status: DC
  Filled 2017-07-28: qty 30

## 2017-07-28 MED ORDER — PREDNISONE 20 MG PO TABS: 40.0000 mg | ORAL_TABLET | Freq: Every day | ORAL | 0 refills | Status: DC

## 2017-07-28 MED ORDER — VITAMIN B-12 1000 MCG SL SUBL: 1.0000 | SUBLINGUAL_TABLET | Freq: Every day | SUBLINGUAL | Status: DC

## 2017-07-28 MED ORDER — ASPIRIN EC 81 MG PO TBEC
81.0000 mg | DELAYED_RELEASE_TABLET | Freq: Every day | ORAL | Status: DC
  Administered 2017-07-28: 81 mg via ORAL
  Filled 2017-07-28: qty 1

## 2017-07-28 MED ORDER — VITAMIN B-12 1000 MCG PO TABS: 1000.0000 ug | ORAL_TABLET | Freq: Every day | ORAL | Status: DC

## 2017-07-28 MED ORDER — GABAPENTIN 100 MG PO CAPS
200.0000 mg | ORAL_CAPSULE | Freq: Two times a day (BID) | ORAL | Status: DC
  Administered 2017-07-28 (×2): 200 mg via ORAL
  Filled 2017-07-28 (×2): qty 2

## 2017-07-28 MED ORDER — ALBUTEROL SULFATE (2.5 MG/3ML) 0.083% IN NEBU: 2.5000 mg | INHALATION_SOLUTION | RESPIRATORY_TRACT | 12 refills | Status: DC | PRN

## 2017-07-28 MED ORDER — ACETAMINOPHEN 325 MG PO TABS
650.0000 mg | ORAL_TABLET | Freq: Four times a day (QID) | ORAL | Status: DC | PRN
  Administered 2017-07-28: 650 mg via ORAL
  Filled 2017-07-28: qty 2

## 2017-07-28 MED ORDER — AZITHROMYCIN 250 MG PO TABS
500.0000 mg | ORAL_TABLET | Freq: Once | ORAL | Status: AC
  Administered 2017-07-28: 500 mg via ORAL
  Filled 2017-07-28: qty 2

## 2017-07-28 MED ORDER — FOLIC ACID 1 MG PO TABS
1.0000 mg | ORAL_TABLET | Freq: Every day | ORAL | Status: DC
  Administered 2017-07-28: 1 mg via ORAL
  Filled 2017-07-28: qty 1

## 2017-07-28 NOTE — Progress Notes (Signed)
Pt d/c home with family.She verbalized understanding of d/c instruction and scripts to fill,

## 2017-07-28 NOTE — Discharge Instructions (Signed)
1) hold meloxicam while taking prednisone due to increased risk of bleeding and stomach ulcers when these medications are combined 2)Avoid ibuprofen/Advil/Aleve/Motrin/Goody Powders/Naproxen/BC powders/Diclofenac/Indomethacin and other Nonsteroidal anti-inflammatory medications as these will make you more likely to bleed and can cause stomach ulcers, can also cause Kidney problems.  3) use oxygen at 2-3 L via nasal cannula continuously 4) continue breathing treatments including albuterol nebulizer treatments 5) follow-up with your doctor for recheck within 1 week 6) you have viral bronchitis-----if symptoms should improve and hopefully resolve over the next few weeks, cough may persist for longer than a couple of weeks

## 2017-07-28 NOTE — Discharge Summary (Addendum)
[name], is a 82 y.o. female  DOB 1935-06-29  MRN [id number].  Admission date:  07/27/2017  Admitting Physician  Cristy Folks, MD  Discharge Date:  07/28/2017   Primary MD  [name], MD  Recommendations for primary care physician for things to follow:  1) hold meloxicam while taking prednisone due to increased risk of bleeding and stomach ulcers when these medications are combined 2)Avoid ibuprofen/Advil/Aleve/Motrin/Goody Powders/Naproxen/BC powders/Diclofenac/Indomethacin and other Nonsteroidal anti-inflammatory medications as these will make you more likely to bleed and can cause stomach ulcers, can also cause Kidney problems.  3) use oxygen at 2-3 L via nasal cannula continuously 4) continue breathing treatments including albuterol nebulizer treatments 5) follow-up with your doctor for recheck within 1 week 6) you have viral bronchitis-----if symptoms should improve and hopefully resolve over the next few weeks, cough may persist for longer than a couple of weeks   Admission Diagnosis  vomiting - pain   Discharge Diagnosis  vomiting - pain    Active Problems:   HLD (hyperlipidemia)   Stage 3 severe COPD by GOLD classification ([place])   COPD with acute exacerbation ([place])   Chronic respiratory failure ([place])      Past Medical History:  Diagnosis Date  . Arthritis   . Bladder neoplasm   . Chronic cough   . Chronic respiratory failure ([place])   . COPD (chronic obstructive pulmonary disease) ([facility])    PULMOLOGIST-  DR [name]--  GOLD III W/  CHRONIC RESPIRATORY FAILURE--  O2 DEPENDENT  . Coronary arteriosclerosis   . Cough productive of purulent sputum   . GERD (gastroesophageal reflux disease)   . Hematuria   . History of nephritis    as child dx w/ Bright's disease (glomerulonephritis)  . Mild obstructive sleep apnea    no cpap recommendation  . Neuropathy, peripheral, idiopathic   .  Pelvic pain in female   . PONV (postoperative nausea and vomiting)   . Supplemental oxygen dependent    2L  via Nasal Canula --  activity and nighttime  . Urinary incontinence in female     Past Surgical History:  Procedure Laterality Date  . APPENDECTOMY  1971  . CARDIAC CATHETERIZATION  05-08-2007  dr Marlou Porch   minor coronary plaquing w/ no significant CAD/  20% mLAD,  10% mRCA,  perserved LV, ef 60-65%  . CATARACT EXTRACTION W/ INTRAOCULAR LENS  IMPLANT, BILATERAL  2001 approx  . CHOLECYSTECTOMY  1988  . CYSTOSCOPY W/ RETROGRADES Bilateral 09/24/2014   Procedure: CYSTOSCOPY WITH RETROGRADE PYELOGRAM;  Surgeon: Festus Aloe, MD;  Location: [facility];  Service: Urology;  Laterality: Bilateral;  . CYSTOSCOPY WITH BIOPSY N/A 09/24/2014   Procedure: CYSTOSCOPY WITH BIOPSY;  Surgeon: Festus Aloe, MD;  Location: [facility];  Service: Urology;  Laterality: N/A;  . ESOPHAGOGASTRODUODENOSCOPY  last one 09-09-2008  . FULGURATION OF BLADDER TUMOR N/A 09/24/2014   Procedure: FULGURATION OF BLADDER TUMOR;  Surgeon: Festus Aloe, MD;  Location[facility];  Service: Urology;  Laterality: N/A;  .  [place]  . POSTERIOR LUMBAR FUSION  10-07-2007   bilateral laminectomy L4 and bilateral L4-5 diskectomy w/ fusion  . TONSILLECTOMY  as child  . TOTAL ABDOMINAL HYSTERECTOMY W/ BILATERAL SALPINGOOPHORECTOMY  1973  . TRANSTHORACIC ECHOCARDIOGRAM  07-07-2011   normal echo,  ef 65-70%       HPI  from the history and physical done on the day of admission:   Patient coming from: home  Chief Complaint: malaise, SOB, cough, fatigue  HPI: [name] is a 82 y.o. female with medical history significant of GOLD stage III COPD (based on PFTs in 2014) chronically on 2LNC, rheumatoid arthritis, HLD, peripheral neuropathy who comes in with malaise, body aches, fatigue and SOB. Pt reports that for the past week she has been taking care  of her son-in-law who has exhibited flu like symptoms for the past week.  Last night patient began to have a sore throat and diffuse body aches.  She had one episode of nonbilious nonbloody emesis and several episodes of diarrhea.  She also began to feel significantly weak and began to have a cough that was productive of green purulent sputum.  She reported subjective fevers, chills.  She also reported increasing shortness of breath and dyspnea on exertion.  She has difficulty taking breaths in.  She did not have any chest pain, abdominal pain, syncope, presyncope, orthopnea, paroxysmal nocturnal dyspnea, lower extremity edema.   ED Course:                In the ED vitals were notable for mild tachycardia and mild tachypnea.  Patient was noted not to be hypoxic or febrile.  Labs are notable for mildly elevated white count of 21.7.  Patient had received IV steroids prior to this.  Patient was given duo nebs.  Chest x-ray showed concern for bronchitis.  Patient was admitted for observation.    Hospital Course:     HLD (hyperlipidemia)   Stage 3 severe COPD by GOLD classification ([place])   COPD with acute exacerbation (H[facility])   Chronic respiratory failure ([place])  Plan:- #) Gold stage III COPD with acute exacerbation: Overall improving respiratory symptoms, Flu panel is negative, chest x-ray suggest bronchitis, okay to discharge home on home oxygen (prior to admission patient used 2-3 L of oxygen which is what she is requiring here in the hospital)----she is currently at baseline with regards to oxygen requirement, discharged home on p.o. prednisone 40 mg daily for 5 days, bronchodilators,  mucolytics and azithromycin   #) Rheumatoid Arthritis:- - Continue hydroxychloroquine 200 mg daily , continue folic acid 1 mg daily  #) Hyperlipidemia: Continue aspirin 81 mg and c/n Atorvastatin 40 mg daily  #) Neuropathy: Stable, -Continue gabapentin 200 mg every morning and noon and 300 mg nightly  #)  Pain/psych: Stable, -Continue sertraline 100 mg daily  #) Chronic hypoxic respiratory failure secondary to COPD as outlined above----appears to be at baseline with regards to oxygen requirement at this time, continue oxygen via nasal cannula at 2-3 L/min, continue bronchodilators, mucolytics please see management of COPD exacerbation above  #) Numerous numerous vitamins: Unclear why patient is on these.   -continue calcium carbonate -vitamin D supplements -Continue cholecalciferol 2000 units daily -Continue vitamin A 10,000 units daily -Continue vitamin B12 1000 mcg daily -Continue vitamin D 400 units daily  Discharge Condition: Stable,  Follow UP-PCP  Diet and Activity recommendation:  As advised  Discharge Instructions    Discharge Instructions    Call MD  for:  difficulty breathing, headache or visual disturbances   Complete by:  As directed    Call MD for:  persistant dizziness or light-headedness   Complete by:  As directed    Call MD for:  persistant nausea and vomiting   Complete by:  As directed    Call MD for:  redness, tenderness, or signs of infection (pain, swelling, redness, odor or green/yellow discharge around incision site)   Complete by:  As directed    Call MD for:  severe uncontrolled pain   Complete by:  As directed    Call MD for:  temperature >100.4   Complete by:  As directed    Diet - low sodium heart healthy   Complete by:  As directed    Discharge instructions   Complete by:  As directed    1) hold meloxicam while taking prednisone due to increased risk of bleeding and stomach ulcers when these medications are combined 2)Avoid ibuprofen/Advil/Aleve/Motrin/Goody Powders/Naproxen/BC powders/Diclofenac/Indomethacin and other Nonsteroidal anti-inflammatory medications as these will make you more likely to bleed and can cause stomach ulcers, can also cause Kidney problems.  3) use oxygen at 2-3 L via nasal cannula continuously 4) continue breathing  treatments including albuterol nebulizer treatments 5) follow-up with your doctor for recheck within 1 week 6) you have viral bronchitis-----if symptoms should improve and hopefully resolve over the next few weeks, cough may persist for longer than a couple of weeks   Increase activity slowly   Complete by:  As directed        Discharge Medications     Allergies as of 07/28/2017      Reactions   Kapidex [dexlansoprazole] Other (See Comments)   Stomach pain, diarrhea   Zegerid [omeprazole] Other (See Comments)   Burning in chest      Medication List    TAKE these medications   albuterol 108 (90 Base) MCG/ACT inhaler Commonly known as:  PROVENTIL HFA;VENTOLIN HFA Inhale 2 puffs into the lungs every 6 (six) hours as needed. For shortness of breath. What changed:  Another medication with the same name was changed. Make sure you understand how and when to take each.   albuterol (2.5 MG/3ML) 0.083% nebulizer solution Commonly known as:  PROVENTIL Take 3 mLs (2.5 mg total) by nebulization every 4 (four) hours as needed for wheezing or shortness of breath. For shortness of breath. What changed:    when to take this  reasons to take this   aspirin EC 81 MG tablet Take 81 mg by mouth daily.   atorvastatin 40 MG tablet Commonly known as:  LIPITOR Take 40 mg by mouth every morning.   azelastine 0.1 % nasal spray Commonly known as:  ASTELIN Place 1 spray into the nose 2 (two) times daily.   azithromycin 250 MG tablet Commonly known as:  ZITHROMAX Daily x 5 days Start taking on:  07/29/2017   budesonide-formoterol 160-4.5 MCG/ACT inhaler Commonly known as:  SYMBICORT Inhale 2 puffs into the lungs 2 (two) times daily.   CALCIUM + D PO Take 2 tablets by mouth daily.   carboxymethylcellulose 0.5 % Soln Commonly known as:  REFRESH PLUS 2 drops 2 (two) times daily as needed (dry eye).   cetirizine 10 MG tablet Commonly known as:  ZYRTEC Take 10 mg by mouth every morning.     cyanocobalamin 1000 MCG tablet Take 1 tablet (1,000 mcg total) by mouth daily.   esomeprazole 40 MG capsule Commonly known as:  NEXIUM Take 1  capsule (40 mg total) by mouth every morning.   folic acid 1 MG tablet Commonly known as:  FOLVITE Take 1 mg by mouth daily.   gabapentin 100 MG capsule Commonly known as:  NEURONTIN Take 200-300 mg by mouth 3 (three) times daily. 200 mg every AM and at noon and 300 mg QHS   guaiFENesin 600 MG 12 hr tablet Commonly known as:  MUCINEX Take 1 tablet (600 mg total) by mouth 2 (two) times daily for 10 days.   hydroxychloroquine 200 MG tablet Commonly known as:  PLAQUENIL Take 200 mg by mouth daily.   ipratropium-albuterol 0.5-2.5 (3) MG/3ML Soln Commonly known as:  DUONEB Take 3 mLs by nebulization 2 (two) times daily.   meloxicam 15 MG tablet Commonly known as:  MOBIC Take 15 mg by mouth daily.   methotrexate 2.5 MG tablet Commonly known as:  RHEUMATREX Take 15 mg by mouth once a week.   predniSONE 20 MG tablet Commonly known as:  DELTASONE Take 2 tablets (40 mg total) by mouth daily with breakfast.   sertraline 100 MG tablet Commonly known as:  ZOLOFT Take 100 mg by mouth daily.   tiotropium 18 MCG inhalation capsule Commonly known as:  SPIRIVA Place 18 mcg into inhaler and inhale every morning.   vitamin A 10000 UNIT capsule Take 10,000 Units by mouth daily.   Vitamin D 2000 units tablet Take 2,000 Units by mouth daily.   vitamin E 400 UNIT capsule Take 400 Units by mouth daily.      Major procedures and Radiology Reports - PLEASE review detailed and final reports for all details, in brief -   Dg Chest 2 View  Result Date: 07/27/2017 CLINICAL DATA:  Nausea and vomiting.  Productive cough.  Chest pain. EXAM: CHEST - 2 VIEW COMPARISON:  05/13/2017 FINDINGS: Artifact overlies the chest. Heart size is normal. Chronic aortic atherosclerosis. No evidence of infiltrate, effusion or collapse. Ordinary degenerative changes  affect the spine. There may be central bronchial thickening. IMPRESSION: Possible bronchitis pattern.  No consolidation or collapse. Electronically Signed   By: Nelson Chimes M.D.   On: 07/27/2017 15:01    Micro Results   No results found for this or any previous visit (from the past 240 hour(s)).  Today   Subjective    [name] today has no new complaints, shortness of breath and cough is improved, no fevers, no chest pains          Patient has been seen and examined prior to discharge   Objective   Blood pressure (!) 154/68, pulse 98, temperature 98.1 F (36.7 C), temperature source Oral, resp. rate 16, weight 79.8 kg (176 lb), SpO2 94 %.   Intake/Output Summary (Last 24 hours) at 07/28/2017 1204 Last data filed at 07/28/2017 0544 Gross per 24 hour  Intake 240 ml  Output -  Net 240 ml    Exam Gen:- Awake Alert, no acute distress, patient is speaking in complete sentences HEENT:- [email or web address].AT, No sclera icterus Nose- [place] at 2 L/min Neck-Supple Neck,No JVD,.  Lungs-improved air movement bilaterally, no wheezing CV- S1, S2 normal Abd-  +ve B.Sounds, Abd Soft, No tenderness,    Extremity/Skin:- No  edema,   good pulses Psych-affect is appropriate, oriented x3 Neuro-no new focal deficits, no tremors   Data Review   CBC w Diff:  Lab Results  Component Value Date   WBC 21.7 (H) 07/27/2017   HGB 12.2 07/27/2017   HCT 38.5 07/27/2017   PLT 361 07/27/2017  LYMPHOPCT 22 05/17/2017   MONOPCT 9 05/17/2017   EOSPCT 3 05/17/2017   BASOPCT 0 05/17/2017    CMP:  Lab Results  Component Value Date   NA 137 07/27/2017   K 4.2 07/27/2017   CL 97 (L) 07/27/2017   CO2 27 07/27/2017   BUN 16 07/27/2017   CREATININE 0.72 07/27/2017   CREATININE 0.74 08/17/2013   PROT 6.1 (L) 05/14/2017   ALBUMIN 3.1 (L) 05/14/2017   BILITOT 0.6 05/14/2017   ALKPHOS 82 05/14/2017   AST 15 05/14/2017   ALT 19 05/14/2017  .   Total Discharge time is about 33 minutes  Roxan Hockey M.D  on 07/28/2017 at 12:04 PM  [facility]   Office  [phone]  Voice Recognition [employer] dictation system was used to create this note, attempts have been made to correct errors. Please contact the author with questions and/or clarifications.

## 2017-07-29 ENCOUNTER — Inpatient Hospital Stay (HOSPITAL_COMMUNITY)
Admission: EM | Admit: 2017-07-29 | Discharge: 2017-08-02 | DRG: 190 | Disposition: A | Discharge status: Home / Self Care | Payer: Medicare Other | Attending: Family Medicine | Admitting: Family Medicine

## 2017-07-29 ENCOUNTER — Emergency Department (HOSPITAL_COMMUNITY): Payer: Medicare Other

## 2017-07-29 ENCOUNTER — Other Ambulatory Visit: Payer: Self-pay

## 2017-07-29 ENCOUNTER — Encounter (HOSPITAL_COMMUNITY): Payer: Self-pay | Admitting: *Deleted

## 2017-07-29 DIAGNOSIS — I2510 Atherosclerotic heart disease of native coronary artery without angina pectoris: Secondary | ICD-10-CM | POA: Diagnosis present

## 2017-07-29 DIAGNOSIS — M069 Rheumatoid arthritis, unspecified: Secondary | ICD-10-CM | POA: Diagnosis present

## 2017-07-29 DIAGNOSIS — Z9981 Dependence on supplemental oxygen: Secondary | ICD-10-CM

## 2017-07-29 DIAGNOSIS — Z791 Long term (current) use of non-steroidal anti-inflammatories (NSAID): Secondary | ICD-10-CM

## 2017-07-29 DIAGNOSIS — G4733 Obstructive sleep apnea (adult) (pediatric): Secondary | ICD-10-CM | POA: Diagnosis present

## 2017-07-29 DIAGNOSIS — Z9841 Cataract extraction status, right eye: Secondary | ICD-10-CM

## 2017-07-29 DIAGNOSIS — J9602 Acute respiratory failure with hypercapnia: Secondary | ICD-10-CM | POA: Diagnosis not present

## 2017-07-29 DIAGNOSIS — J9621 Acute and chronic respiratory failure with hypoxia: Secondary | ICD-10-CM | POA: Diagnosis not present

## 2017-07-29 DIAGNOSIS — R079 Chest pain, unspecified: Secondary | ICD-10-CM | POA: Diagnosis not present

## 2017-07-29 DIAGNOSIS — Z7982 Long term (current) use of aspirin: Secondary | ICD-10-CM | POA: Diagnosis not present

## 2017-07-29 DIAGNOSIS — Z79899 Other long term (current) drug therapy: Secondary | ICD-10-CM

## 2017-07-29 DIAGNOSIS — K219 Gastro-esophageal reflux disease without esophagitis: Secondary | ICD-10-CM | POA: Diagnosis present

## 2017-07-29 DIAGNOSIS — E785 Hyperlipidemia, unspecified: Secondary | ICD-10-CM | POA: Diagnosis not present

## 2017-07-29 DIAGNOSIS — J9690 Respiratory failure, unspecified, unspecified whether with hypoxia or hypercapnia: Secondary | ICD-10-CM | POA: Diagnosis not present

## 2017-07-29 DIAGNOSIS — Z888 Allergy status to other drugs, medicaments and biological substances status: Secondary | ICD-10-CM

## 2017-07-29 DIAGNOSIS — J9601 Acute respiratory failure with hypoxia: Secondary | ICD-10-CM | POA: Diagnosis not present

## 2017-07-29 DIAGNOSIS — J441 Chronic obstructive pulmonary disease with (acute) exacerbation: Principal | ICD-10-CM | POA: Diagnosis present

## 2017-07-29 DIAGNOSIS — J9622 Acute and chronic respiratory failure with hypercapnia: Secondary | ICD-10-CM | POA: Diagnosis present

## 2017-07-29 DIAGNOSIS — Z981 Arthrodesis status: Secondary | ICD-10-CM

## 2017-07-29 DIAGNOSIS — Z961 Presence of intraocular lens: Secondary | ICD-10-CM | POA: Diagnosis present

## 2017-07-29 DIAGNOSIS — R05 Cough: Secondary | ICD-10-CM | POA: Diagnosis not present

## 2017-07-29 DIAGNOSIS — G629 Polyneuropathy, unspecified: Secondary | ICD-10-CM

## 2017-07-29 DIAGNOSIS — Z9842 Cataract extraction status, left eye: Secondary | ICD-10-CM | POA: Diagnosis not present

## 2017-07-29 DIAGNOSIS — J449 Chronic obstructive pulmonary disease, unspecified: Secondary | ICD-10-CM

## 2017-07-29 DIAGNOSIS — M1990 Unspecified osteoarthritis, unspecified site: Secondary | ICD-10-CM | POA: Diagnosis present

## 2017-07-29 DIAGNOSIS — R0602 Shortness of breath: Secondary | ICD-10-CM | POA: Diagnosis not present

## 2017-07-29 HISTORY — DX: Acute respiratory failure with hypoxia: J96.01

## 2017-07-29 HISTORY — DX: Polyneuropathy, unspecified: G62.9

## 2017-07-29 HISTORY — DX: Rheumatoid arthritis, unspecified: M06.9

## 2017-07-29 LAB — BLOOD GAS, ARTERIAL
Acid-Base Excess: 5.4 mmol/L — ABNORMAL HIGH (ref 0.0–2.0)
Bicarbonate: 31.6 mmol/L — ABNORMAL HIGH (ref 20.0–28.0)
DELIVERY SYSTEMS: POSITIVE
Drawn by: 331471
FIO2: 50
HI FREQUENCY JET VENT PIP: 10
Inspiratory PAP: 5
O2 Saturation: 98.6 %
PH ART: 7.363 (ref 7.350–7.450)
Patient temperature: 37
pCO2 arterial: 57 mmHg — ABNORMAL HIGH (ref 32.0–48.0)
pO2, Arterial: 133 mmHg — ABNORMAL HIGH (ref 83.0–108.0)

## 2017-07-29 LAB — CBC
HEMATOCRIT: 38.7 % (ref 36.0–46.0)
Hemoglobin: 11.8 g/dL — ABNORMAL LOW (ref 12.0–15.0)
MCH: 29.4 pg (ref 26.0–34.0)
MCHC: 30.5 g/dL (ref 30.0–36.0)
MCV: 96.5 fL (ref 78.0–100.0)
PLATELETS: 395 10*3/uL (ref 150–400)
RBC: 4.01 MIL/uL (ref 3.87–5.11)
RDW: 16.8 % — ABNORMAL HIGH (ref 11.5–15.5)
WBC: 21.3 10*3/uL — AB (ref 4.0–10.5)

## 2017-07-29 LAB — BASIC METABOLIC PANEL
ANION GAP: 13 (ref 5–15)
BUN: 15 mg/dL (ref 6–20)
CALCIUM: 8.8 mg/dL — AB (ref 8.9–10.3)
CO2: 28 mmol/L (ref 22–32)
CREATININE: 0.63 mg/dL (ref 0.44–1.00)
Chloride: 95 mmol/L — ABNORMAL LOW (ref 101–111)
GFR calc Af Amer: >60 mL/min (ref >60)
GLUCOSE: 179 mg/dL — AB (ref 65–99)
Potassium: 3.9 mmol/L (ref 3.5–5.1)
Sodium: 136 mmol/L (ref 135–145)

## 2017-07-29 LAB — MRSA PCR SCREENING: MRSA by PCR: POSITIVE — AB

## 2017-07-29 LAB — I-STAT TROPONIN, ED: Troponin i, poc: 0.03 ng/mL (ref 0.00–0.08)

## 2017-07-29 LAB — BRAIN NATRIURETIC PEPTIDE: B NATRIURETIC PEPTIDE 5: 237.7 pg/mL — AB (ref 0.0–100.0)

## 2017-07-29 LAB — TROPONIN I: Troponin I: 0.04 ng/mL (ref <0.03)

## 2017-07-29 MED ORDER — LORATADINE 10 MG PO TABS
10.0000 mg | ORAL_TABLET | Freq: Every day | ORAL | Status: DC
  Administered 2017-07-30 – 2017-08-02 (×4): 10 mg via ORAL
  Filled 2017-07-29 (×4): qty 1

## 2017-07-29 MED ORDER — IPRATROPIUM-ALBUTEROL 0.5-2.5 (3) MG/3ML IN SOLN
3.0000 mL | RESPIRATORY_TRACT | Status: DC
  Administered 2017-07-29 – 2017-07-31 (×14): 3 mL via RESPIRATORY_TRACT
  Filled 2017-07-29 (×13): qty 3

## 2017-07-29 MED ORDER — ENOXAPARIN SODIUM 40 MG/0.4ML ~~LOC~~ SOLN
40.0000 mg | SUBCUTANEOUS | Status: DC
  Administered 2017-07-29 – 2017-08-01 (×4): 40 mg via SUBCUTANEOUS
  Filled 2017-07-29 (×4): qty 0.4

## 2017-07-29 MED ORDER — SODIUM CHLORIDE 0.9 % IV SOLN
500.0000 mg | INTRAVENOUS | Status: DC
  Administered 2017-07-30 – 2017-07-31 (×2): 500 mg via INTRAVENOUS
  Filled 2017-07-29 (×2): qty 500

## 2017-07-29 MED ORDER — METHYLPREDNISOLONE SODIUM SUCC 125 MG IJ SOLR
125.0000 mg | Freq: Once | INTRAMUSCULAR | Status: AC
  Administered 2017-07-29: 125 mg via INTRAVENOUS
  Filled 2017-07-29: qty 2

## 2017-07-29 MED ORDER — LORAZEPAM 2 MG/ML IJ SOLN
0.5000 mg | INTRAMUSCULAR | Status: DC | PRN
  Administered 2017-07-29 – 2017-08-01 (×2): 0.5 mg via INTRAVENOUS
  Filled 2017-07-29 (×2): qty 1

## 2017-07-29 MED ORDER — ALBUTEROL SULFATE (2.5 MG/3ML) 0.083% IN NEBU
5.0000 mg | INHALATION_SOLUTION | Freq: Once | RESPIRATORY_TRACT | Status: AC
  Administered 2017-07-29: 5 mg via RESPIRATORY_TRACT
  Filled 2017-07-29: qty 6

## 2017-07-29 MED ORDER — METHOTREXATE 2.5 MG PO TABS: 15.0000 mg | ORAL_TABLET | ORAL | Status: DC

## 2017-07-29 MED ORDER — SODIUM CHLORIDE 0.9 % IV SOLN
2.0000 g | INTRAVENOUS | Status: DC
  Administered 2017-07-29 – 2017-08-01 (×4): 2 g via INTRAVENOUS
  Filled 2017-07-29 (×3): qty 2
  Filled 2017-07-29: qty 20
  Filled 2017-07-29: qty 2

## 2017-07-29 MED ORDER — HYDROXYCHLOROQUINE SULFATE 200 MG PO TABS
200.0000 mg | ORAL_TABLET | Freq: Every day | ORAL | Status: DC
  Administered 2017-07-30 – 2017-08-02 (×4): 200 mg via ORAL
  Filled 2017-07-29 (×5): qty 1

## 2017-07-29 MED ORDER — ALBUTEROL SULFATE (2.5 MG/3ML) 0.083% IN NEBU
5.0000 mg | INHALATION_SOLUTION | Freq: Once | RESPIRATORY_TRACT | Status: AC
  Administered 2017-07-29: 5 mg via RESPIRATORY_TRACT

## 2017-07-29 MED ORDER — SERTRALINE HCL 100 MG PO TABS
100.0000 mg | ORAL_TABLET | Freq: Every day | ORAL | Status: DC
  Administered 2017-07-30 – 2017-08-02 (×4): 100 mg via ORAL
  Filled 2017-07-29: qty 1
  Filled 2017-07-29: qty 2
  Filled 2017-07-29: qty 1
  Filled 2017-07-29: qty 2

## 2017-07-29 MED ORDER — IPRATROPIUM-ALBUTEROL 0.5-2.5 (3) MG/3ML IN SOLN
RESPIRATORY_TRACT | Status: AC
  Administered 2017-07-29: 3 mL via RESPIRATORY_TRACT
  Filled 2017-07-29: qty 3

## 2017-07-29 MED ORDER — ALBUTEROL SULFATE (2.5 MG/3ML) 0.083% IN NEBU: 2.5000 mg | INHALATION_SOLUTION | RESPIRATORY_TRACT | Status: DC | PRN

## 2017-07-29 MED ORDER — CHLORHEXIDINE GLUCONATE CLOTH 2 % EX PADS
6.0000 | MEDICATED_PAD | Freq: Every day | CUTANEOUS | Status: DC
  Administered 2017-07-30 – 2017-08-02 (×3): 6 via TOPICAL

## 2017-07-29 MED ORDER — MUPIROCIN 2 % EX OINT
1.0000 "application " | TOPICAL_OINTMENT | Freq: Two times a day (BID) | CUTANEOUS | Status: DC
  Administered 2017-07-29 – 2017-08-02 (×8): 1 via NASAL
  Filled 2017-07-29 (×2): qty 22

## 2017-07-29 MED ORDER — ACETAMINOPHEN 650 MG RE SUPP: 650.0000 mg | Freq: Four times a day (QID) | RECTAL | Status: DC | PRN

## 2017-07-29 MED ORDER — MAGNESIUM CITRATE PO SOLN: 1.0000 | Freq: Once | ORAL | Status: DC | PRN

## 2017-07-29 MED ORDER — ONDANSETRON HCL 4 MG/2ML IJ SOLN: 4.0000 mg | Freq: Four times a day (QID) | INTRAMUSCULAR | Status: DC | PRN

## 2017-07-29 MED ORDER — PANTOPRAZOLE SODIUM 40 MG PO TBEC
40.0000 mg | DELAYED_RELEASE_TABLET | Freq: Every day | ORAL | Status: DC
  Administered 2017-07-30 – 2017-08-02 (×4): 40 mg via ORAL
  Filled 2017-07-29 (×4): qty 1

## 2017-07-29 MED ORDER — POLYETHYLENE GLYCOL 3350 17 G PO PACK: 17.0000 g | PACK | Freq: Every day | ORAL | Status: DC | PRN

## 2017-07-29 MED ORDER — CARBOXYMETHYLCELLULOSE SODIUM 0.5 % OP SOLN: 2.0000 [drp] | Freq: Two times a day (BID) | OPHTHALMIC | Status: DC | PRN

## 2017-07-29 MED ORDER — ASPIRIN EC 81 MG PO TBEC
81.0000 mg | DELAYED_RELEASE_TABLET | Freq: Every day | ORAL | Status: DC
  Administered 2017-07-30 – 2017-08-02 (×4): 81 mg via ORAL
  Filled 2017-07-29 (×4): qty 1

## 2017-07-29 MED ORDER — GUAIFENESIN ER 600 MG PO TB12
1200.0000 mg | ORAL_TABLET | Freq: Two times a day (BID) | ORAL | Status: DC
  Administered 2017-07-29 – 2017-08-02 (×8): 1200 mg via ORAL
  Filled 2017-07-29 (×8): qty 2

## 2017-07-29 MED ORDER — METHYLPREDNISOLONE SODIUM SUCC 125 MG IJ SOLR
60.0000 mg | Freq: Four times a day (QID) | INTRAMUSCULAR | Status: DC
  Administered 2017-07-29 – 2017-07-31 (×7): 60 mg via INTRAVENOUS
  Filled 2017-07-29 (×7): qty 2

## 2017-07-29 MED ORDER — ACETAMINOPHEN 325 MG PO TABS
650.0000 mg | ORAL_TABLET | Freq: Four times a day (QID) | ORAL | Status: DC | PRN
  Administered 2017-07-30 – 2017-08-01 (×4): 650 mg via ORAL
  Filled 2017-07-29 (×4): qty 2

## 2017-07-29 MED ORDER — SODIUM CHLORIDE 0.9 % IV SOLN: 500.0000 mg | INTRAVENOUS | Status: DC

## 2017-07-29 MED ORDER — ATORVASTATIN CALCIUM 40 MG PO TABS
40.0000 mg | ORAL_TABLET | Freq: Every morning | ORAL | Status: DC
  Administered 2017-07-30 – 2017-08-02 (×4): 40 mg via ORAL
  Filled 2017-07-29 (×4): qty 1

## 2017-07-29 MED ORDER — BISACODYL 5 MG PO TBEC: 5.0000 mg | DELAYED_RELEASE_TABLET | Freq: Every day | ORAL | Status: DC | PRN

## 2017-07-29 MED ORDER — GABAPENTIN 300 MG PO CAPS
300.0000 mg | ORAL_CAPSULE | Freq: Every day | ORAL | Status: DC
  Administered 2017-07-29 – 2017-08-01 (×4): 300 mg via ORAL
  Filled 2017-07-29 (×4): qty 1

## 2017-07-29 MED ORDER — MELOXICAM 15 MG PO TABS
15.0000 mg | ORAL_TABLET | Freq: Every day | ORAL | Status: DC
  Administered 2017-07-30 – 2017-07-31 (×2): 15 mg via ORAL
  Filled 2017-07-29 (×3): qty 1

## 2017-07-29 MED ORDER — GABAPENTIN 100 MG PO CAPS
200.0000 mg | ORAL_CAPSULE | Freq: Two times a day (BID) | ORAL | Status: DC
  Administered 2017-07-30 – 2017-08-02 (×8): 200 mg via ORAL
  Filled 2017-07-29 (×8): qty 2

## 2017-07-29 MED ORDER — ONDANSETRON HCL 4 MG PO TABS: 4.0000 mg | ORAL_TABLET | Freq: Four times a day (QID) | ORAL | Status: DC | PRN

## 2017-07-29 MED ORDER — POLYVINYL ALCOHOL 1.4 % OP SOLN
2.0000 [drp] | OPHTHALMIC | Status: DC | PRN
  Filled 2017-07-29: qty 15

## 2017-07-29 MED ORDER — ALBUTEROL SULFATE (2.5 MG/3ML) 0.083% IN NEBU
INHALATION_SOLUTION | RESPIRATORY_TRACT | Status: AC
  Filled 2017-07-29: qty 3

## 2017-07-29 MED ORDER — GUAIFENESIN ER 600 MG PO TB12: 600.0000 mg | ORAL_TABLET | Freq: Two times a day (BID) | ORAL | Status: DC

## 2017-07-29 NOTE — Progress Notes (Signed)
Assisted with PT transport to [facility]- uneventful. PT remains on BiPAP with ICU RN at bedside. ICU RT aware of PT arrival.

## 2017-07-29 NOTE — ED Triage Notes (Signed)
Pt from home herefor shortness of breath, pt sats were 75% on her home O2. Pt was discharged from hospital last night.

## 2017-07-29 NOTE — H&P (Signed)
History and Physical    [name]:[id number] DOB: 1935/11/25 DOA: 07/29/2017  Referring MD/NP/PA: Dorie Rank PCP: [name], MD   Patient coming from: home  Chief Complaint: SOB  HPI: [name] is a 82 y.o. female with history of COPD who sometimes wears home oxygen of 2 L, CAD, GERD, obstructive sleep apnea but not on CPAP who was admitted on 4/6 and discharged on 4/7 for acute COPD exacerbation.  Although she felt better than when she had initially presented to the emergency department she still felt very [name] of breath and had significant dyspnea with ambulating to and from the bathroom at the time that she went home.  She went home, she started her prescription medications and continued her home duo nebs twice daily, Spiriva, and Symbicort.  She became increasingly [name] of breath overnight and used her albuterol inhaler every few hours without relief.  This morning she called up her sister and told her she did not think she was going to make it.  She was brought back to the emergency department for reevaluation.  She denies any fevers or chills at home.  She has had a substernal chest pressure with radiation to the right shoulder which was present since she came to the emergency department the first time.  The pain is moderate and comes and goes.  It is associated with slight worsening shortness of breath.  She is also had some mild nausea.  This morning she felt lightheaded.  ED Course: Vital signs tachycardic and tachypnea to the 30s with moderate to severe respiratory distress.  She was immediately placed on BiPAP.  Labs: ABG on BiPAP 7.36/50 7/133.  White blood cell count 21.3, BNP 237, troponin negative.  EKG demonstrated sinus tachycardia without acute ischemia.  Chest x-ray again showed no acute infiltrate, no pleural effusions or evidence of interstitial edema.  Review of Systems: Having stress incontinence with coughing. Complete 12 point review of systems reviewed  with patient and negative except as mentioned above.    Past Medical History:  Diagnosis Date  . Arthritis   . Bladder neoplasm   . Chronic cough   . Chronic respiratory failure ([place])   . COPD (chronic obstructive pulmonary disease) (HCC)    PULMOLOGIST-  DR [name]--  GOLD III W/  CHRONIC RESPIRATORY FAILURE--  O2 DEPENDENT  . Coronary arteriosclerosis   . Cough productive of purulent sputum   . GERD (gastroesophageal reflux disease)   . Hematuria   . History of nephritis    as child dx w/ Bright's disease (glomerulonephritis)  . Mild obstructive sleep apnea    no cpap recommendation  . Neuropathy, peripheral, idiopathic   . Pelvic pain in female   . PONV (postoperative nausea and vomiting)   . Supplemental oxygen dependent    2L  via Nasal Canula --  activity and nighttime  . Urinary incontinence in female     Past Surgical History:  Procedure Laterality Date  . APPENDECTOMY  1971  . CARDIAC CATHETERIZATION  05-08-2007  dr Marlou Porch   minor coronary plaquing w/ no significant CAD/  20% mLAD,  10% mRCA,  perserved LV, ef 60-65%  . CATARACT EXTRACTION W/ INTRAOCULAR LENS  IMPLANT, BILATERAL  2001 approx  . CHOLECYSTECTOMY  1988  . CYSTOSCOPY W/ RETROGRADES Bilateral 09/24/2014   Procedure: CYSTOSCOPY WITH RETROGRADE PYELOGRAM;  Surgeon: Festus Aloe, MD;  Location: [facility];  Service: Urology;  Laterality: Bilateral;  . CYSTOSCOPY WITH BIOPSY N/A 09/24/2014  Procedure: CYSTOSCOPY WITH BIOPSY;  Surgeon: Festus Aloe, MD;  Location: [facility];  Service: Urology;  Laterality: N/A;  . ESOPHAGOGASTRODUODENOSCOPY  last one 09-09-2008  . FULGURATION OF BLADDER TUMOR N/A 09/24/2014   Procedure: FULGURATION OF BLADDER TUMOR;  Surgeon: Festus Aloe, MD;  Location: [facility];  Service: Urology;  Laterality: N/A;  . [place]  . POSTERIOR LUMBAR FUSION  10-07-2007   bilateral laminectomy L4 and bilateral L4-5 diskectomy  w/ fusion  . TONSILLECTOMY  as child  . TOTAL ABDOMINAL HYSTERECTOMY W/ BILATERAL SALPINGOOPHORECTOMY  1973  . TRANSTHORACIC ECHOCARDIOGRAM  07-07-2011   normal echo,  ef 65-70%     reports that she quit smoking about 6 years ago. Her smoking use included cigarettes. She has a 180.00 pack-year smoking history. She has never used smokeless tobacco. She reports that she does not drink alcohol or use drugs.  Allergies  Allergen Reactions  . Kapidex [Dexlansoprazole] Other (See Comments)    Stomach pain, diarrhea  . Zegerid [Omeprazole] Other (See Comments)    Burning in chest    Family History  Problem Relation Age of Onset  . Asthma Maternal Grandmother     Prior to Admission medications   Medication Sig Start Date End Date Taking? Authorizing Provider  albuterol (PROVENTIL HFA;VENTOLIN HFA) 108 (90 BASE) MCG/ACT inhaler Inhale 2 puffs into the lungs every 6 (six) hours as needed. For shortness of breath.    [provider]  albuterol (PROVENTIL) (2.5 MG/3ML) 0.083% nebulizer solution Take 3 mLs (2.5 mg total) by nebulization every 4 (four) hours as needed for wheezing or shortness of breath. For shortness of breath. 07/28/17   Roxan Hockey, MD  aspirin EC 81 MG tablet Take 81 mg by mouth daily.    [provider]  atorvastatin (LIPITOR) 40 MG tablet Take 40 mg by mouth every morning.     [provider]  azelastine (ASTELIN) 0.1 % nasal spray Place 1 spray into the nose 2 (two) times daily.    [provider]  azithromycin (ZITHROMAX) 250 MG tablet Daily x 5 days 07/29/17   Roxan Hockey, MD  budesonide-formoterol [facility]) 160-4.5 MCG/ACT inhaler Inhale 2 puffs into the lungs 2 (two) times daily.    [provider]  Calcium Carbonate-Vitamin D (CALCIUM + D PO) Take 2 tablets by mouth daily.     [provider]  carboxymethylcellulose (REFRESH PLUS) 0.5 % SOLN 2 drops 2 (two) times daily as needed (dry eye).    [provider]  cetirizine (ZYRTEC) 10 MG tablet Take 10 mg by mouth every morning.     [provider]  Cholecalciferol (VITAMIN D) 2000 UNITS tablet Take 2,000 Units by mouth daily.    [provider]  esomeprazole (NEXIUM) 40 MG capsule Take 1 capsule (40 mg total) by mouth every morning. 07/28/17   Roxan Hockey, MD  folic acid (FOLVITE) 1 MG tablet Take 1 mg by mouth daily.     [provider]  gabapentin (NEURONTIN) 100 MG capsule Take 200-300 mg by mouth 3 (three) times daily. 200 mg every AM and at noon and 300 mg QHS    [provider]  guaiFENesin (MUCINEX) 600 MG 12 hr tablet Take 1 tablet (600 mg total) by mouth 2 (two) times daily for 10 days. 07/28/17 08/07/17  Roxan Hockey, MD  hydroxychloroquine (PLAQUENIL) 200 MG tablet Take 200 mg by mouth daily.    [provider]  ipratropium-albuterol (DUONEB) 0.5-2.5 (  3) MG/3ML SOLN Take 3 mLs by nebulization 2 (two) times daily.    [provider]  meloxicam (MOBIC) 15 MG tablet Take 15 mg by mouth daily. 05/14/16   [provider]  methotrexate (RHEUMATREX) 2.5 MG tablet Take 15 mg by mouth once a week.  06/05/17   [provider]  predniSONE (DELTASONE) 20 MG tablet Take 2 tablets (40 mg total) by mouth daily with breakfast. 07/28/17   Roxan Hockey, MD  sertraline (ZOLOFT) 100 MG tablet Take 100 mg by mouth daily.    [provider]  tiotropium (SPIRIVA) 18 MCG inhalation capsule Place 18 mcg into inhaler and inhale every morning.     [provider]  vitamin A 10000 UNIT capsule Take 10,000 Units by mouth daily.    [provider]  vitamin B-12 1000 MCG tablet Take 1 tablet (1,000 mcg total) by mouth daily. 05/18/17   Florencia Reasons, MD  vitamin E 400 UNIT capsule Take 400 Units by mouth daily.    [provider]    Physical Exam: Vitals:   07/29/17 1200 07/29/17 1300 07/29/17 1330 07/29/17 1400  BP: (!) 170/54 115/62 128/63 140/66    Pulse: (!) 135 (!) 112 (!) 107 (!) 108  Resp: (!) 32 (!) 22 (!) 28 (!) 31  SpO2: 91% 98% 97% 97%    Constitutional: Mild tachypnea even on BiPAP Eyes: PERRL, lids and conjunctivae normal ENMT: Deferred, BiPAP mask in place Neck:  No nuchal rigidity, no masses, no JVP Respiratory: High-pitched wheeze audible even on BiPAP, no obvious focal rales or rhonchi.  She continues to cough frequently.   Cardiovascular: Tachycardic, regular rhythm, no murmurs / rubs / gallops.  2+ radial pulses. Abdomen:  Normal active bowel sounds, soft, nondistended, nontender Musculoskeletal: Normal muscle tone and bulk.  No contractures.  Skin:  no rashes, abrasions, or ulcers Neurologic:  CN 2-12 grossly intact. Sensation intact to light touch, strength 5/5 throughout Psychiatric:  Alert and oriented x 3. Normal affect.   Labs on Admission: I have personally reviewed following labs and imaging studies  CBC: Recent Labs  Lab 07/27/17 1411 07/29/17 1142  WBC 21.7* 21.3*  HGB 12.2 11.8*  HCT 38.5 38.7  MCV 92.8 96.5  PLT 361 761   Basic Metabolic Panel: Recent Labs  Lab 07/27/17 1411 07/29/17 1142  NA 137 136  K 4.2 3.9  CL 97* 95*  CO2 27 28  GLUCOSE 144* 179*  BUN 16 15  CREATININE 0.72 0.63  CALCIUM 8.8* 8.8*   GFR: Estimated Creatinine Clearance: 53.3 mL/min (by C-G formula based on SCr of 0.63 mg/dL). Liver Function Tests: No results for input(s): AST, ALT, ALKPHOS, BILITOT, PROT, ALBUMIN in the last 168 hours. No results for input(s): LIPASE, AMYLASE in the last 168 hours. No results for input(s): AMMONIA in the last 168 hours. Coagulation Profile: No results for input(s): INR, PROTIME in the last 168 hours. Cardiac Enzymes: No results for input(s): CKTOTAL, CKMB, CKMBINDEX, TROPONINI in the last 168 hours. BNP (last 3 results) No results for input(s): PROBNP in the last 8760 hours. HbA1C: No results for input(s): HGBA1C in the last 72 hours. CBG: No results for input(s):  GLUCAP in the last 168 hours. Lipid Profile: No results for input(s): CHOL, HDL, LDLCALC, TRIG, CHOLHDL, LDLDIRECT in the last 72 hours. Thyroid Function Tests: No results for input(s): TSH, T4TOTAL, FREET4, T3FREE, THYROIDAB in the last 72 hours. Anemia Panel: No results for input(s): VITAMINB12, FOLATE, FERRITIN, TIBC, IRON, RETICCTPCT in  the last 72 hours. Urine analysis:    Component Value Date/Time   COLORURINE YELLOW 07/27/2017 1419   APPEARANCEUR CLEAR 07/27/2017 1419   LABSPEC 1.015 07/27/2017 1419   PHURINE 7.0 07/27/2017 1419   GLUCOSEU NEGATIVE 07/27/2017 1419   HGBUR NEGATIVE 07/27/2017 1419   BILIRUBINUR NEGATIVE 07/27/2017 1419   BILIRUBINUR neg 08/17/2013 1446   KETONESUR TRACE (A) 07/27/2017 1419   PROTEINUR NEGATIVE 07/27/2017 1419   UROBILINOGEN 0.2 08/17/2013 1446   UROBILINOGEN 0.2 07/06/2011 1519   NITRITE NEGATIVE 07/27/2017 1419   LEUKOCYTESUR TRACE (A) 07/27/2017 1419   Sepsis Labs: @LABRCNTIP (procalcitonin:4,lacticidven:4) ) Recent Results (from the past 240 hour(s))  Culture, blood (routine x 2)     Status: None (Preliminary result)   Collection Time: 07/27/17  3:02 PM  Result Value Ref Range Status   Specimen Description   Final    BLOOD BLOOD RIGHT HAND Performed at [facility] [address] [place], [place] [address]    Special Requests   Final    BOTTLES DRAWN AEROBIC AND ANAEROBIC Blood Culture adequate volume Performed at [facility] [address] [place], [place] [address]    Culture   Final    NO GROWTH 2 DAYS Performed at [facility] [address], [place], [place] [address]    Report Status PENDING  Incomplete  Culture, blood (routine x 2)     Status: None (Preliminary result)   Collection Time: 07/27/17  3:02 PM  Result Value Ref Range Status   Specimen Description   Final    BLOOD RIGHT ANTECUBITAL Performed at [facility] [address],  [place], [place] [address]    Special Requests   Final    BOTTLES DRAWN AEROBIC AND ANAEROBIC Blood Culture adequate volume Performed at [facility] [address] [place], [place] [address]    Culture   Final    NO GROWTH 2 DAYS Performed at [facility] [address], [place], [place] [address]    Report Status PENDING  Incomplete  Culture, respiratory (NON-Expectorated)     Status: None (Preliminary result)   Collection Time: 07/28/17  7:30 AM  Result Value Ref Range Status   Specimen Description   Final    SPUTUM Performed at [facility] [address], [place], [place] [address]    Special Requests   Final    NONE Performed at [facility], [place] [address] [place], [place] [address]    Gram Stain   Final    FEW WBC PRESENT, PREDOMINANTLY PMN RARE SQUAMOUS EPITHELIAL CELLS PRESENT MODERATE GRAM POSITIVE COCCI IN PAIRS RARE GRAM POSITIVE RODS FEW GRAM NEGATIVE RODS    Culture   Final    CULTURE REINCUBATED FOR BETTER GROWTH Performed at [facility], [place] [address], [place], [place] [address]    Report Status PENDING  Incomplete     Radiological Exams on Admission: Dg Chest Portable 1 View  Result Date: 07/29/2017 CLINICAL DATA:  Shortness of breath with decreased oxygen saturation EXAM: PORTABLE CHEST 1 VIEW COMPARISON:  July 27, 2017 FINDINGS: There is mild scarring in the apices and left base regions. There is no edema or consolidation. The heart size and pulmonary vascularity are normal. There is aortic atherosclerosis. No adenopathy. No bone lesions. IMPRESSION: Scattered areas of scarring bilaterally. No edema or consolidation. There is aortic atherosclerosis. Aortic Atherosclerosis (ICD10-I70.0). Electronically Signed   By: Lowella Grip  III M.D.   On: 07/29/2017 12:05    EKG: Independently reviewed.  Sinus tachycardia, no acute ischemia  Assessment/Plan Principal Problem:   Acute on  chronic respiratory failure with hypoxia (HCC) Active Problems:   HLD (hyperlipidemia)   COPD with acute exacerbation (HCC)   Rheumatoid arthritis (HCC)   Neuropathy   Acute on chronic respiratory failure with hypoxia secondary to acute COPD exacerbation, GOLD III COPD  -Admit to stepdown for BiPAP -Patient may remove BiPAP around dinnertime for a break, then would like her to resume her BiPAP overnight if she can tolerate it. -We will do a trial off BiPAP tomorrow morning -Solu-Medrol 60 mg IV every 6 hours -Duo nebs every 4 hours -Azithromycin -We will try again for sputum culture -Currently not moving enough air to really use her inhalers -Pulmonology consult, patient previously followed by Dr. [name]  Chest pressure, EKG demonstrates no evidence of acute ischemia and her initial troponin was negative -Telemetry -Cycle troponins -Echocardiogram  Coronary artery disease -Continue aspirin, statin -Patient is not on beta-blocker and we will not start given respiratory distress  Neuropathy, stable, continue Neurontin and Zoloft  Rheumatoid arthritis, stable, continue Plaquenil, methotrexate, low meloxicam  GERD, stable, continue PPI  DVT prophylaxis: Lovenox Code Status: Full code Family Communication:  Patient and her sister who is at bedside Disposition Plan: Home in several days Consults called: Pulmonology Admission status: Inpatient, stepdown  Janece Canterbury MD [facility] Pager [phone]  If 7PM-7AM, please contact night-coverage [email or web address] Password [facility]  07/29/2017, 3:37 PM

## 2017-07-29 NOTE — ED Notes (Signed)
CRITICAL CARE PRESENT SPEAKING WITH PT AND FAMILY

## 2017-07-29 NOTE — ED Provider Notes (Signed)
[place] DEPT Provider Note   CSN: [id number] Arrival date & time: 07/29/17  1116     History   Chief Complaint Chief Complaint  Patient presents with  . Shortness of Breath    HPI [name] is a 82 y.o. female.  HPI Patient presents for shortness of breath after recently being discharged from the hospital for COPD exacerbation.  Patient has a history of COPD.  She is usually on nasal cannula oxygen at 2 L.  Patient was exposed to a flulike illness over the last couple of weeks.  A few days ago she started having trouble with subjective fevers, shortness of breath and productive cough.  She is also had constant tightness and discomfort in her chest.  Feels like she cannot breathe deeply.  Patient was seen in the emergency room on April 6.  She was admitted to the hospital and discharged yesterday.  Patient states this morning when she woke up her symptoms were much more severe again and felt like when she was in the hospital the first time.  She is unable to catch her breath.  It is difficult for her to speak.  She continues to have constant chest tightness.  Her symptoms are severe and not improving. Past Medical History:  Diagnosis Date  . Arthritis   . Bladder neoplasm   . Chronic cough   . Chronic respiratory failure ([place])   . COPD (chronic obstructive pulmonary disease) (HCC)    PULMOLOGIST-  DR [name]--  GOLD III W/  CHRONIC RESPIRATORY FAILURE--  O2 DEPENDENT  . Coronary arteriosclerosis   . Cough productive of purulent sputum   . GERD (gastroesophageal reflux disease)   . Hematuria   . History of nephritis    as child dx w/ Bright's disease (glomerulonephritis)  . Mild obstructive sleep apnea    no cpap recommendation  . Neuropathy, peripheral, idiopathic   . Pelvic pain in female   . PONV (postoperative nausea and vomiting)   . Supplemental oxygen dependent    2L  via Nasal Canula --  activity and nighttime  . Urinary  incontinence in female     Patient Active Problem List   Diagnosis Date Noted  . Anemia 05/20/2016  . Hyperglycemia 05/20/2016  . Shoulder impingement syndrome 03/25/2015  . Chronic respiratory failure ([place]) 04/10/2012  . Constipation 07/03/2011  . COPD with acute exacerbation ([place]) 06/29/2011  . Stage 3 severe COPD by GOLD classification ([place]) 06/19/2007  . HLD (hyperlipidemia) 05/15/2007  . ACID REFLUX DISEASE 05/15/2007    Past Surgical History:  Procedure Laterality Date  . APPENDECTOMY  1971  . CARDIAC CATHETERIZATION  05-08-2007  dr Marlou Porch   minor coronary plaquing w/ no significant CAD/  20% mLAD,  10% mRCA,  perserved LV, ef 60-65%  . CATARACT EXTRACTION W/ INTRAOCULAR LENS  IMPLANT, BILATERAL  2001 approx  . CHOLECYSTECTOMY  1988  . CYSTOSCOPY W/ RETROGRADES Bilateral 09/24/2014   Procedure: CYSTOSCOPY WITH RETROGRADE PYELOGRAM;  Surgeon: Festus Aloe, MD;  Location[facility];  Service: Urology;  Laterality: Bilateral;  . CYSTOSCOPY WITH BIOPSY N/A 09/24/2014   Procedure: CYSTOSCOPY WITH BIOPSY;  Surgeon: Festus Aloe, MD;  Location: [facility];  Service: Urology;  Laterality: N/A;  . ESOPHAGOGASTRODUODENOSCOPY  last one 09-09-2008  . FULGURATION OF BLADDER TUMOR N/A 09/24/2014   Procedure: FULGURATION OF BLADDER TUMOR;  Surgeon: Festus Aloe, MD;  Location: [facility];  Service: Urology;  Laterality: N/A;  .  [place]  . POSTERIOR LUMBAR FUSION  10-07-2007   bilateral laminectomy L4 and bilateral L4-5 diskectomy w/ fusion  . TONSILLECTOMY  as child  . TOTAL ABDOMINAL HYSTERECTOMY W/ BILATERAL SALPINGOOPHORECTOMY  1973  . TRANSTHORACIC ECHOCARDIOGRAM  07-07-2011   normal echo,  ef 65-70%     OB History   None      Home Medications    Prior to Admission medications   Medication Sig Start Date End Date Taking? Authorizing Provider  albuterol (PROVENTIL HFA;VENTOLIN HFA) 108 (90 BASE) MCG/ACT  inhaler Inhale 2 puffs into the lungs every 6 (six) hours as needed. For shortness of breath.    [provider]  albuterol (PROVENTIL) (2.5 MG/3ML) 0.083% nebulizer solution Take 3 mLs (2.5 mg total) by nebulization every 4 (four) hours as needed for wheezing or shortness of breath. For shortness of breath. 07/28/17   Roxan Hockey, MD  aspirin EC 81 MG tablet Take 81 mg by mouth daily.    [provider]  atorvastatin (LIPITOR) 40 MG tablet Take 40 mg by mouth every morning.     [provider]  azelastine (ASTELIN) 0.1 % nasal spray Place 1 spray into the nose 2 (two) times daily.    [provider]  azithromycin (ZITHROMAX) 250 MG tablet Daily x 5 days 07/29/17   Roxan Hockey, MD  budesonide-formoterol [facility]) 160-4.5 MCG/ACT inhaler Inhale 2 puffs into the lungs 2 (two) times daily.    [provider]  Calcium Carbonate-Vitamin D (CALCIUM + D PO) Take 2 tablets by mouth daily.     [provider]  carboxymethylcellulose (REFRESH PLUS) 0.5 % SOLN 2 drops 2 (two) times daily as needed (dry eye).    [provider]  cetirizine (ZYRTEC) 10 MG tablet Take 10 mg by mouth every morning.     [provider]  Cholecalciferol (VITAMIN D) 2000 UNITS tablet Take 2,000 Units by mouth daily.    [provider]  esomeprazole (NEXIUM) 40 MG capsule Take 1 capsule (40 mg total) by mouth every morning. 07/28/17   Roxan Hockey, MD  folic acid (FOLVITE) 1 MG tablet Take 1 mg by mouth daily.     [provider]  gabapentin (NEURONTIN) 100 MG capsule Take 200-300 mg by mouth 3 (three) times daily. 200 mg every AM and at noon and 300 mg QHS    [provider]  guaiFENesin (MUCINEX) 600 MG 12 hr tablet Take 1 tablet (600 mg total) by mouth 2 (two) times daily for 10 days. 07/28/17 08/07/17  Roxan Hockey, MD  hydroxychloroquine (PLAQUENIL) 200 MG tablet Take 200 mg by mouth daily.    [provider]    ipratropium-albuterol (DUONEB) 0.5-2.5 (3) MG/3ML SOLN Take 3 mLs by nebulization 2 (two) times daily.    [provider]  meloxicam (MOBIC) 15 MG tablet Take 15 mg by mouth daily. 05/14/16   [provider]  methotrexate (RHEUMATREX) 2.5 MG tablet Take 15 mg by mouth once a week.  06/05/17   [provider]  predniSONE (DELTASONE) 20 MG tablet Take 2 tablets (40 mg total) by mouth daily with breakfast. 07/28/17   Roxan Hockey, MD  sertraline (ZOLOFT) 100 MG tablet Take 100 mg by mouth daily.    [provider]  tiotropium (SPIRIVA) 18 MCG inhalation capsule Place 18 mcg into inhaler and inhale every morning.     [provider]  vitamin A 10000 UNIT capsule Take 10,000 Units by mouth daily.  [provider]  vitamin B-12 1000 MCG tablet Take 1 tablet (1,000 mcg total) by mouth daily. 05/18/17   Florencia Reasons, MD  vitamin E 400 UNIT capsule Take 400 Units by mouth daily.    [provider]    Family History Family History  Problem Relation Age of Onset  . Asthma Maternal Grandmother     Social History Social History   Tobacco Use  . Smoking status: Former Smoker    Packs/day: 3.00    Years: 60.00    Pack years: 180.00    Types: Cigarettes    Last attempt to quit: 06/25/2011    Years since quitting: 6.0  . Smokeless tobacco: Never Used  Substance Use Topics  . Alcohol use: No  . Drug use: No     Allergies   Kapidex [dexlansoprazole] and Zegerid [omeprazole]   Review of Systems Review of Systems  All other systems reviewed and are negative.    Physical Exam Updated Vital Signs BP (!) 170/54   Pulse (!) 135   Resp (!) 32   SpO2 91%   Physical Exam  Constitutional: No distress.  HENT:  Head: Normocephalic and atraumatic.  Right Ear: External ear normal.  Left Ear: External ear normal.  Eyes: Conjunctivae are normal. Right eye exhibits no discharge. Left eye exhibits no discharge. No scleral icterus.   Neck: Neck supple. No tracheal deviation present.  Cardiovascular: Normal rate, regular rhythm and intact distal pulses.  Pulmonary/Chest: Accessory muscle usage present. No stridor. Tachypnea noted. No respiratory distress. She has decreased breath sounds. She has wheezes. She has no rales.  Abdominal: Soft. Bowel sounds are normal. She exhibits no distension. There is no tenderness. There is no rebound and no guarding.  Musculoskeletal: She exhibits no edema or tenderness.  Neurological: She is alert. She has normal strength. No cranial nerve deficit (no facial droop, extraocular movements intact, no slurred speech) or sensory deficit. She exhibits normal muscle tone. She displays no seizure activity. Coordination normal.  Skin: Skin is warm and dry. No rash noted.  Psychiatric: She has a normal mood and affect.  Nursing note and vitals reviewed.    ED Treatments / Results  Labs (all labs ordered are listed, but only abnormal results are displayed) Labs Reviewed  BRAIN NATRIURETIC PEPTIDE - Abnormal; Notable for the following components:      Result Value   B Natriuretic Peptide 237.7 (*)    All other components within normal limits  CBC - Abnormal; Notable for the following components:   WBC 21.3 (*)    Hemoglobin 11.8 (*)    RDW 16.8 (*)    All other components within normal limits  BASIC METABOLIC PANEL - Abnormal; Notable for the following components:   Chloride 95 (*)    Glucose, Bld 179 (*)    Calcium 8.8 (*)    All other components within normal limits  BLOOD GAS, ARTERIAL - Abnormal; Notable for the following components:   pCO2 arterial 57.0 (*)    pO2, Arterial 133 (*)    Bicarbonate 31.6 (*)    Acid-Base Excess 5.4 (*)    All other components within normal limits  I-STAT TROPONIN, ED    EKG EKG Interpretation  Date/Time:  Monday July 29 2017 11:30:03 EDT Ventricular Rate:  123 PR Interval:    QRS Duration: 76 QT Interval:  272 QTC Calculation: 389 R  Axis:   40 Text Interpretation:  Sinus tachycardia Abnormal R-wave progression, early transition No significant change  since last tracing Confirmed by Dorie Rank [phone]) on 07/29/2017 11:34:53 AM   Radiology Dg Chest 2 View  Result Date: 07/27/2017 CLINICAL DATA:  Nausea and vomiting.  Productive cough.  Chest pain. EXAM: CHEST - 2 VIEW COMPARISON:  05/13/2017 FINDINGS: Artifact overlies the chest. Heart size is normal. Chronic aortic atherosclerosis. No evidence of infiltrate, effusion or collapse. Ordinary degenerative changes affect the spine. There may be central bronchial thickening. IMPRESSION: Possible bronchitis pattern.  No consolidation or collapse. Electronically Signed   By: Nelson Chimes M.D.   On: 07/27/2017 15:01   Dg Chest Portable 1 View  Result Date: 07/29/2017 CLINICAL DATA:  Shortness of breath with decreased oxygen saturation EXAM: PORTABLE CHEST 1 VIEW COMPARISON:  July 27, 2017 FINDINGS: There is mild scarring in the apices and left base regions. There is no edema or consolidation. The heart size and pulmonary vascularity are normal. There is aortic atherosclerosis. No adenopathy. No bone lesions. IMPRESSION: Scattered areas of scarring bilaterally. No edema or consolidation. There is aortic atherosclerosis. Aortic Atherosclerosis (ICD10-I70.0). Electronically Signed   By: Lowella Grip III M.D.   On: 07/29/2017 12:05    Procedures .Critical Care Performed by: Dorie Rank, MD Authorized by: Dorie Rank, MD   Critical care provider statement:    Critical care time (minutes):  35   Critical care was time spent personally by me on the following activities:  Discussions with consultants, evaluation of patient's response to treatment, examination of patient, ordering and performing treatments and interventions, ordering and review of laboratory studies, ordering and review of radiographic studies, pulse oximetry, re-evaluation of patient's condition, obtaining history from patient  or surrogate and review of old charts   (including critical care time)  Medications Ordered in ED Medications  albuterol (PROVENTIL) (2.5 MG/3ML) 0.083% nebulizer solution (  Not Given 07/29/17 1135)  albuterol (PROVENTIL) (2.5 MG/3ML) 0.083% nebulizer solution 5 mg (has no administration in time range)  albuterol (PROVENTIL) (2.5 MG/3ML) 0.083% nebulizer solution 5 mg (5 mg Nebulization Given 07/29/17 1135)  methylPREDNISolone sodium succinate (SOLU-MEDROL) 125 mg/2 mL injection 125 mg (125 mg Intravenous Given 07/29/17 1152)     Initial Impression / Assessment and Plan / ED Course  I have reviewed the triage vital signs and the nursing notes.  Pertinent labs & imaging results that were available during my care of the patient were reviewed by me and considered in my medical decision making (see chart for details).  Clinical Course as of Jul 30 1322  Mon Jul 29, 2017  1320 Patient symptoms have improved.  She is breathing more easily.  Respiratory effort has decreased.   [JK]  1322 Heart rate is 109, respiratory rate is 26 now at the bedside, oxygen is 100% on BiPAP   [JK]    Clinical Course User Index [JK] Dorie Rank, MD   Patient presented to the emergency room for recurrent shortness of breath.  She was recently in the hospital for COPD exacerbation.  When the patient arrived she was tachypneic using accessory muscles.  Patient was started on breathing treatments as well as BiPAP.  Her symptoms have improved significantly.  I have continued on her BiPAP for her comfort.  Her ABG does show some mild elevation in her PCO2.  No significant acidosis.  I suspect this goes along with her COPD exacerbation.  Patient does have a leukocytosis however she has been on steroids.  No signs of heart failure or cardiac ischemia.  I will consult with medical service for  admission.  Final Clinical Impressions(s) / ED Diagnoses   Final diagnoses:  COPD exacerbation ([place])  Acute respiratory failure with  hypercapnia ([place])      Dorie Rank, MD 07/29/17 1324

## 2017-07-29 NOTE — ED Notes (Signed)
EDP JON KNAPP AT BEDSIDE EVALUATING PT.

## 2017-07-29 NOTE — ED Notes (Signed)
ED TO INPATIENT HANDOFF REPORT  Name/Age/Gender [name] 82 y.o. female  Code Status    Code Status Orders  (From admission, onward)        Start     Ordered   07/29/17 1450  Full code  Continuous     07/29/17 1451    Code Status History    Date Active Date Inactive Code Status Order ID Comments User Context   07/28/2017 0539 07/28/2017 1637 Full Code [id number]  Cristy Folks, MD Inpatient   05/13/2017 2011 05/17/2017 2007 Full Code [id number]  Robbie Lis, MD ED   05/20/2016 2217 05/23/2016 1707 Full Code [id number]  Jani Gravel, MD Inpatient   03/25/2015 1054 03/29/2015 2149 Full Code [id number]  Marcellus Scott Inpatient   07/12/2011 1911 07/13/2011 1741 Full Code [id number]  Samuella Cota, MD Inpatient      Home/SNF/Other Home  Chief Complaint SOB  Level of Care/Admitting Diagnosis ED Disposition    ED Disposition Condition Comment   [employer] [facility] Area: [facility] [[id number]]  Level of Care: Stepdown [14]  Admit to SDU based on following criteria: Respiratory Distress:  Frequent assessment and/or intervention to maintain adequate ventilation/respiration, pulmonary toilet, and respiratory treatment.  Diagnosis: COPD exacerbation [facility]) [[id number]]  Admitting Physician: Janece Canterbury [phone]  Attending Physician: Janece Canterbury [phone]  Estimated length of stay: 3 - 4 days  Certification:: I certify this patient will need inpatient services for at least 2 midnights  PT Class (Do Not Modify): Inpatient [101]  PT Acc Code (Do Not Modify): Private [1]       Medical History Past Medical History:  Diagnosis Date  . Arthritis   . Bladder neoplasm   . Chronic cough   . Chronic respiratory failure ([place])   . COPD (chronic obstructive pulmonary disease) (HCC)    PULMOLOGIST-  DR [name]--  GOLD III W/  CHRONIC RESPIRATORY FAILURE--  O2 DEPENDENT  . Coronary arteriosclerosis   . Cough productive of purulent sputum   . GERD (gastroesophageal  reflux disease)   . Hematuria   . History of nephritis    as child dx w/ Bright's disease (glomerulonephritis)  . Mild obstructive sleep apnea    no cpap recommendation  . Neuropathy, peripheral, idiopathic   . Pelvic pain in female   . PONV (postoperative nausea and vomiting)   . Supplemental oxygen dependent    2L  via Nasal Canula --  activity and nighttime  . Urinary incontinence in female     Allergies Allergies  Allergen Reactions  . Kapidex [Dexlansoprazole] Other (See Comments)    Stomach pain, diarrhea  . Zegerid [Omeprazole] Other (See Comments)    Burning in chest    IV Location/Drains/Wounds Patient Lines/Drains/Airways Status   Active Line/Drains/Airways    Name:   Placement date:   Placement time:   Site:   Days:   Peripheral IV 07/29/17 Right Wrist   07/29/17    1132    Wrist   less than 1          Labs/Imaging Results for orders placed or performed during the hospital encounter of 07/29/17 (from the past 48 hour(s))  I-stat troponin, ED     Status: None   Collection Time: 07/29/17 11:41 AM  Result Value Ref Range   Troponin i, poc 0.03 0.00 - 0.08 ng/mL   Comment 3            Comment: Due to the release kinetics of cTnI,  a negative result within the first hours of the onset of symptoms does not rule out myocardial infarction with certainty. If myocardial infarction is still suspected, repeat the test at appropriate intervals.   Brain natriuretic peptide     Status: Abnormal   Collection Time: 07/29/17 11:42 AM  Result Value Ref Range   B Natriuretic Peptide 237.7 (H) 0.0 - 100.0 pg/mL    Comment: Performed at [facility], [place] [address] [place], [place] [address]  CBC     Status: Abnormal   Collection Time: 07/29/17 11:42 AM  Result Value Ref Range   WBC 21.3 (H) 4.0 - 10.5 K/uL   RBC 4.01 3.87 - 5.11 MIL/uL   Hemoglobin 11.8 (L) 12.0 - 15.0 g/dL   HCT 38.7 36.0 - 46.0 %   MCV 96.5 78.0 - 100.0 fL   MCH 29.4 26.0 - 34.0  pg   MCHC 30.5 30.0 - 36.0 g/dL   RDW 16.8 (H) 11.5 - 15.5 %   Platelets 395 150 - 400 K/uL    Comment: Performed at [facility], [place] [address], [place], [place] [address]  Basic metabolic panel     Status: Abnormal   Collection Time: 07/29/17 11:42 AM  Result Value Ref Range   Sodium 136 135 - 145 mmol/L   Potassium 3.9 3.5 - 5.1 mmol/L   Chloride 95 (L) 101 - 111 mmol/L   CO2 28 22 - 32 mmol/L   Glucose, Bld 179 (H) 65 - 99 mg/dL   BUN 15 6 - 20 mg/dL   Creatinine, Ser 0.63 0.44 - 1.00 mg/dL   Calcium 8.8 (L) 8.9 - 10.3 mg/dL   GFR calc non Af Amer >60 >60 mL/min   GFR calc Af Amer >60 >60 mL/min    Comment: (NOTE) The eGFR has been calculated using the CKD EPI equation. This calculation has not been validated in all clinical situations. eGFR's persistently <60 mL/min signify possible Chronic Kidney Disease.    Anion gap 13 5 - 15    Comment: Performed at [facility], [place] [address] [place], [place] [address]  Blood gas, arterial ([facility] & [facility] ONLY)     Status: Abnormal   Collection Time: 07/29/17 12:04 PM  Result Value Ref Range   FIO2 50.00    Delivery systems BILEVEL POSITIVE AIRWAY PRESSURE    Hi Frequency JET Vent PIP 10    Inspiratory PAP 5    pH, Arterial 7.363 7.350 - 7.450   pCO2 arterial 57.0 (H) 32.0 - 48.0 mmHg   pO2, Arterial 133 (H) 83.0 - 108.0 mmHg   Bicarbonate 31.6 (H) 20.0 - 28.0 mmol/L   Acid-Base Excess 5.4 (H) 0.0 - 2.0 mmol/L   O2 Saturation 98.6 %   Patient temperature 37.0    Collection site RIGHT RADIAL    Drawn by [id number]    Sample type ARTERIAL DRAW    Allens test (pass/fail) PASS PASS    Comment: Performed at [facility], [place] [address] [place], [place] [address]   Dg Chest Portable 1 View  Result Date: 07/29/2017 CLINICAL DATA:  Shortness of breath with decreased oxygen saturation EXAM: PORTABLE CHEST 1 VIEW COMPARISON:  July 27, 2017 FINDINGS: There is mild scarring in the apices  and left base regions. There is no edema or consolidation. The heart size and pulmonary vascularity are normal. There is aortic atherosclerosis. No adenopathy. No bone lesions. IMPRESSION: Scattered areas of scarring bilaterally. No edema or  consolidation. There is aortic atherosclerosis. Aortic Atherosclerosis (ICD10-I70.0). Electronically Signed   By: Lowella Grip III M.D.   On: 07/29/2017 12:05    Pending Labs Unresulted Labs (From admission, onward)   Start     Ordered   07/30/17 6295  Basic metabolic panel  Tomorrow morning,   R     07/29/17 1451   07/30/17 0500  CBC  Tomorrow morning,   R     07/29/17 1451   07/29/17 1451  Troponin I (q 6hr x 3)  Now then every 6 hours,   R     07/29/17 1451   07/29/17 1446  Culture, expectorated sputum-assessment  Once,   R    Question:  Patient immune status  Answer:  Normal   07/29/17 1447      Vitals/Pain Today's Vitals   07/29/17 1330 07/29/17 1400 07/29/17 1530 07/29/17 1548  BP: 128/63 140/66 135/73 135/73  Pulse: (!) 107 (!) 108 (!) 107 (!) 104  Resp: (!) 28 (!) 31 (!) 25 (!) 23  SpO2: 97% 97% 96% 96%  PainSc:        Isolation Precautions No active isolations  Medications Medications  albuterol (PROVENTIL) (2.5 MG/3ML) 0.083% nebulizer solution (  Not Given 07/29/17 1135)  aspirin EC tablet 81 mg (has no administration in time range)  atorvastatin (LIPITOR) tablet 40 mg (has no administration in time range)  carboxymethylcellulose (REFRESH PLUS) 0.5 % ophthalmic solution 2 drop (has no administration in time range)  loratadine (CLARITIN) tablet 10 mg (has no administration in time range)  pantoprazole (PROTONIX) EC tablet 40 mg (has no administration in time range)  hydroxychloroquine (PLAQUENIL) tablet 200 mg (has no administration in time range)  ipratropium-albuterol (DUONEB) 0.5-2.5 (3) MG/3ML nebulizer solution 3 mL (3 mLs Nebulization Given 07/29/17 1547)  meloxicam (MOBIC) tablet 15 mg (has no administration in time  range)  methotrexate (RHEUMATREX) tablet 15 mg (has no administration in time range)  sertraline (ZOLOFT) tablet 100 mg (has no administration in time range)  gabapentin (NEURONTIN) capsule 200 mg (has no administration in time range)  gabapentin (NEURONTIN) capsule 300 mg (has no administration in time range)  methylPREDNISolone sodium succinate (SOLU-MEDROL) 125 mg/2 mL injection 60 mg (has no administration in time range)  azithromycin (ZITHROMAX) 500 mg in sodium chloride 0.9 % 250 mL IVPB (has no administration in time range)  enoxaparin (LOVENOX) injection 40 mg (has no administration in time range)  acetaminophen (TYLENOL) tablet 650 mg (has no administration in time range)    Or  acetaminophen (TYLENOL) suppository 650 mg (has no administration in time range)  polyethylene glycol (MIRALAX / GLYCOLAX) packet 17 g (has no administration in time range)  bisacodyl (DULCOLAX) EC tablet 5 mg (has no administration in time range)  magnesium citrate solution 1 Bottle (has no administration in time range)  ondansetron (ZOFRAN) tablet 4 mg (has no administration in time range)    Or  ondansetron (ZOFRAN) injection 4 mg (has no administration in time range)  cefTRIAXone (ROCEPHIN) 2 g in sodium chloride 0.9 % 100 mL IVPB (has no administration in time range)  albuterol (PROVENTIL) (2.5 MG/3ML) 0.083% nebulizer solution 2.5 mg (has no administration in time range)  guaiFENesin (MUCINEX) 12 hr tablet 1,200 mg (has no administration in time range)  albuterol (PROVENTIL) (2.5 MG/3ML) 0.083% nebulizer solution 5 mg (5 mg Nebulization Given 07/29/17 1135)  methylPREDNISolone sodium succinate (SOLU-MEDROL) 125 mg/2 mL injection 125 mg (125 mg Intravenous Given 07/29/17 1152)  albuterol (PROVENTIL) (2.5 MG/3ML)  0.083% nebulizer solution 5 mg (5 mg Nebulization Given 07/29/17 1419)    Mobility walks with device

## 2017-07-29 NOTE — Consult Note (Addendum)
PULMONARY / CRITICAL CARE MEDICINE   Name: [name] MRN: [id number] DOB: March 18, 1936    ADMISSION DATE:  07/29/2017 CONSULTATION DATE:  07/29/17  REFERRING MD:  Dr. Sheran Fava / [facility]   CHIEF COMPLAINT:  Shortness of Breath   HISTORY OF PRESENT ILLNESS:   82 y/o F who presented to [facility] ER on 4/8 with shortness of breath.    The patient was admitted 4/6-4/7 with a COPD exacerbation.  CXR was negative at that time.  She was treated with nebulized bronchodilators, steroid taper, mucolytic's and azithromycin.  The patient was on baseline 2-3L O2 prior to discharge.    The patient woke up the day of admit with an increase in her symptoms.  She returned 4/8 to the ER with complaints of increasing shortness of breath.  O2 saturations were 75% on arrival.  She reported ongoing tightness / discomfort in her chest and difficulty taking a deep breath.  ER notes she had accessory muscle use, tachypnea, decreased breath sounds and wheezing.  She was given nebulized bronchodilators and placed on BiPAP.  ABG 7.363 / 57 / 133 / 31.6.  Labs - Na 136, K 3.9, Cl 95, CO2 28, glucose 179, BUN 15, Cr 0.63, BNP 237, troponin 0.03, WBC 21.3, hgb 11.8 and platelets 395.  CXR was negative for acute infiltrate / edema.    PCCM consulted for evaluation of dyspnea.   The patient reports improvement with BiPAP use.  She states she did not feel she was ready for discharge on 4/7.  She did not sleep overnight and was much worse this am.  She did not want to call EMS as she lives in [facility] and wanted her care at [facility] because her physicians are here (followed by Dr. [name]).  She called her sister who drove from [place] to [place] to pick her up and bring her back to [facility].  Reports her son-in-law has been really sick (she lives with him to help with her granddaughter since her daughter died).     PAST MEDICAL HISTORY :  She  has a past medical history of Arthritis, Bladder neoplasm, Chronic cough, Chronic  respiratory failure ([place]), COPD (chronic obstructive pulmonary disease) ([place]), Coronary arteriosclerosis, Cough productive of purulent sputum, GERD (gastroesophageal reflux disease), Hematuria, History of nephritis, Mild obstructive sleep apnea, Neuropathy, peripheral, idiopathic, Pelvic pain in female, PONV (postoperative nausea and vomiting), Supplemental oxygen dependent, and Urinary incontinence in female.  PAST SURGICAL HISTORY: She  has a past surgical history that includes Tonsillectomy (as child); Posterior lumbar fusion (10-07-2007); Hemorrhoid surgery (1973); Cardiac catheterization (05-08-2007  dr Marlou Porch); transthoracic echocardiogram (07-07-2011); Appendectomy (1971); Total abdominal hysterectomy w/ bilateral salpingoophorectomy (1973); Cholecystectomy (1988); Esophagogastroduodenoscopy (last one 09-09-2008); Cataract extraction w/ intraocular lens  implant, bilateral (2001 approx); Cystoscopy with biopsy (N/A, 09/24/2014); Fulguration of bladder tumor (N/A, 09/24/2014); and Cystoscopy w/ retrogrades (Bilateral, 09/24/2014).  Allergies  Allergen Reactions  . Kapidex [Dexlansoprazole] Other (See Comments)    Stomach pain, diarrhea  . Zegerid [Omeprazole] Other (See Comments)    Burning in chest    No current facility-administered medications on file prior to encounter.    Current Outpatient Medications on File Prior to Encounter  Medication Sig  . albuterol (PROVENTIL HFA;VENTOLIN HFA) 108 (90 BASE) MCG/ACT inhaler Inhale 2 puffs into the lungs every 6 (six) hours as needed. For shortness of breath.  Marland Kitchen albuterol (PROVENTIL) (2.5 MG/3ML) 0.083% nebulizer solution Take 3 mLs (2.5 mg total) by nebulization every 4 (four) hours as needed  for wheezing or shortness of breath. For shortness of breath.  Marland Kitchen aspirin EC 81 MG tablet Take 81 mg by mouth daily.  Marland Kitchen atorvastatin (LIPITOR) 40 MG tablet Take 40 mg by mouth every morning.   Marland Kitchen azelastine (ASTELIN) 0.1 % nasal spray Place 1 spray into the nose  2 (two) times daily.  Marland Kitchen azithromycin (ZITHROMAX) 250 MG tablet Daily x 5 days  . budesonide-formoterol (SYMBICORT) 160-4.5 MCG/ACT inhaler Inhale 2 puffs into the lungs 2 (two) times daily.  . Calcium Carbonate-Vitamin D (CALCIUM + D PO) Take 2 tablets by mouth daily.   . carboxymethylcellulose (REFRESH PLUS) 0.5 % SOLN 2 drops 2 (two) times daily as needed (dry eye).  . cetirizine (ZYRTEC) 10 MG tablet Take 10 mg by mouth every morning.   . Cholecalciferol (VITAMIN D) 2000 UNITS tablet Take 2,000 Units by mouth daily.  Marland Kitchen esomeprazole (NEXIUM) 40 MG capsule Take 1 capsule (40 mg total) by mouth every morning.  . folic acid (FOLVITE) 1 MG tablet Take 1 mg by mouth daily.   Marland Kitchen gabapentin (NEURONTIN) 100 MG capsule Take 200-300 mg by mouth 3 (three) times daily. 200 mg every AM and at noon and 300 mg QHS  . guaiFENesin (MUCINEX) 600 MG 12 hr tablet Take 1 tablet (600 mg total) by mouth 2 (two) times daily for 10 days.  . hydroxychloroquine (PLAQUENIL) 200 MG tablet Take 200 mg by mouth daily.  Marland Kitchen ipratropium-albuterol (DUONEB) 0.5-2.5 (3) MG/3ML SOLN Take 3 mLs by nebulization 2 (two) times daily.  . meloxicam (MOBIC) 15 MG tablet Take 15 mg by mouth daily.  . methotrexate (RHEUMATREX) 2.5 MG tablet Take 15 mg by mouth once a week.   . predniSONE (DELTASONE) 20 MG tablet Take 2 tablets (40 mg total) by mouth daily with breakfast.  . sertraline (ZOLOFT) 100 MG tablet Take 100 mg by mouth daily.  Marland Kitchen tiotropium (SPIRIVA) 18 MCG inhalation capsule Place 18 mcg into inhaler and inhale every morning.   . vitamin A 10000 UNIT capsule Take 10,000 Units by mouth daily.  . vitamin B-12 1000 MCG tablet Take 1 tablet (1,000 mcg total) by mouth daily.  . vitamin E 400 UNIT capsule Take 400 Units by mouth daily.    FAMILY HISTORY:  Her indicated that her mother is deceased. She indicated that her father is deceased. She indicated that four of her six sisters are alive. She indicated that three of her four  brothers are alive. She indicated that her maternal grandmother is deceased. She indicated that her maternal grandfather is deceased. She indicated that her paternal grandmother is deceased. She indicated that her paternal grandfather is deceased. She indicated that three of her four daughters are alive. She indicated that both of her sons are deceased.   SOCIAL HISTORY: She  reports that she quit smoking about 6 years ago. Her smoking use included cigarettes. She has a 180.00 pack-year smoking history. She has never used smokeless tobacco. She reports that she does not drink alcohol or use drugs.  REVIEW OF SYSTEMS:  Positives in bold Gen: Denies subjective fevers, chills, weight change, fatigue, night sweats HEENT: Denies blurred vision, double vision, hearing loss, tinnitus, sinus congestion, rhinorrhea, sore throat, neck stiffness, dysphagia PULM: Denies shortness of breath, cough, sputum production, hemoptysis, wheezing CV: Denies chest pain, edema, orthopnea, paroxysmal nocturnal dyspnea, palpitations GI: Denies abdominal pain, nausea, vomiting, diarrhea, hematochezia, melena, constipation, change in bowel habits GU: Denies dysuria, hematuria, polyuria, oliguria, urethral discharge Endocrine: Denies hot or cold intolerance,  polyuria, polyphagia or appetite change Derm: Denies rash, dry skin, scaling or peeling skin change Heme: Denies easy bruising, bleeding, bleeding gums Neuro: Denies headache, numbness, weakness, slurred speech, loss of memory or consciousness   SUBJECTIVE:  Pt reports feeling better with use of BiPAP.   VITAL SIGNS: BP 140/66   Pulse (!) 108   Resp (!) 31   SpO2 97%   HEMODYNAMICS:    VENTILATOR SETTINGS: FiO2 (%):  [40 %] 40 %  INTAKE / OUTPUT: No intake/output data recorded.  PHYSICAL EXAMINATION: General: elderly female in NAD  HEENT: MM pink/moist, BiPAP mask in place PSY: calm/appropriate  Neuro: AAOx4, speech clear, MAE  CV: s1s2 rrr, no  m/r/g PULM: even/non-labored, lungs bilaterally diminished EY:CXKG, non-tender, bsx4 active  Extremities: warm/dry, no edema  Skin: no rashes or lesions  LABS:  BMET Recent Labs  Lab 07/27/17 1411 07/29/17 1142  NA 137 136  K 4.2 3.9  CL 97* 95*  CO2 27 28  BUN 16 15  CREATININE 0.72 0.63  GLUCOSE 144* 179*    Electrolytes Recent Labs  Lab 07/27/17 1411 07/29/17 1142  CALCIUM 8.8* 8.8*    CBC Recent Labs  Lab 07/27/17 1411 07/29/17 1142  WBC 21.7* 21.3*  HGB 12.2 11.8*  HCT 38.5 38.7  PLT 361 395    Coag's No results for input(s): APTT, INR in the last 168 hours.  Sepsis Markers Recent Labs  Lab 07/27/17 1418 07/27/17 1607  LATICACIDVEN 0.93 0.95    ABG Recent Labs  Lab 07/29/17 1204  PHART 7.363  PCO2ART 57.0*  PO2ART 133*    Liver Enzymes No results for input(s): AST, ALT, ALKPHOS, BILITOT, ALBUMIN in the last 168 hours.  Cardiac Enzymes No results for input(s): TROPONINI, PROBNP in the last 168 hours.  Glucose No results for input(s): GLUCAP in the last 168 hours.  Imaging Dg Chest Portable 1 View  Result Date: 07/29/2017 CLINICAL DATA:  Shortness of breath with decreased oxygen saturation EXAM: PORTABLE CHEST 1 VIEW COMPARISON:  July 27, 2017 FINDINGS: There is mild scarring in the apices and left base regions. There is no edema or consolidation. The heart size and pulmonary vascularity are normal. There is aortic atherosclerosis. No adenopathy. No bone lesions. IMPRESSION: Scattered areas of scarring bilaterally. No edema or consolidation. There is aortic atherosclerosis. Aortic Atherosclerosis (ICD10-I70.0). Electronically Signed   By: Lowella Grip III M.D.   On: 07/29/2017 12:05    STUDIES:    CULTURES: Sputum 4/8 >>   ANTIBIOTICS: Azithromycin 4/8 >>   SIGNIFICANT EVENTS: 4/6-4/7  Admit with AECOPD 4/08  Readmitted with AECOPD  LINES/TUBES:   DISCUSSION: 82 y/o F with PMH of COPD with recent admit for  exacerbation (4/6-4/7) who returned 4/8 with SOB.   Concern for LLL atelectasis vs infiltrate.     ASSESSMENT / PLAN:  PULMONARY A: Acute Exacerbation of COPD  ? LLL Atelectasis vs Infiltrate Acute on Chronic Hypoxic Respiratory Failure - baseline 2-3L O2 dependent  Former Tobacco Abuse - heavy smoker from childhood to 2013.  Heaviest was 3ppd.  Hx of RA - no cervical fusion, on methotrexate, plaquenil  P:   BiPAP PRN for increased work of breathing  Expand abx to cover for possible CAP  Solumedrol 60 mg IV Q6, reduce dosing as able  Wean O2 for sats 88-95% NPO while on bipap > ok for her to have clear liquids if no work of breathing off bipap  Follow CXR intermittently  Pulmonary hygiene - IS, mobilize  Duoneb Q4 + PRN albuterol  Mucinex BID PPI QD    FAMILY  - Updates: Sister & patient updated at bedside 4/8 pm.    - GOC - patient and sister report she would be accepting of short term ventilator support for a reversible process.   MD to follow.    [name], NP-C [facility] Pgr: [phone] or if no answer [phone] 07/29/2017, 3:11 PM

## 2017-07-29 NOTE — Progress Notes (Signed)
CRITICAL VALUE ALERT  Critical Value:  Troponin 0.04  Date & Time Notied:  .07/29/2017 1924  Provider Notified: Silas [place]  Orders Received/Actions taken: na

## 2017-07-29 NOTE — ED Notes (Signed)
RRT LISA AT BEDSIDE UPDATING ADMISSION MD WITH PT'S CURRENT STATUS

## 2017-07-29 NOTE — ED Notes (Signed)
RRT BI-PAP STARTED.

## 2017-07-29 NOTE — ED Notes (Signed)
RRT DEE AT BEDSIDE

## 2017-07-29 NOTE — Progress Notes (Signed)
BRIEF PHARMACY NOTE:  Pharmacy asked to assist with dosing of Ceftriaxone for CAP. PCCM already dosed at 2g IV q24h, which is appropriate at this time given BMI > 30, severity of illness. Need for further dosage adjustment appears unlikely at present.    Pharmacy will sign off at this time.  Please reconsult if a change in clinical status warrants re-evaluation of dosage.   Lindell Spar, PharmD, BCPS Pager: [phone] 07/29/2017 4:36 PM

## 2017-07-29 NOTE — Plan of Care (Signed)
  Problem: Pain Managment: Goal: General experience of comfort will improve Outcome: Progressing   Problem: Elimination: Goal: Will not experience complications related to bowel motility Outcome: Progressing   Problem: Safety: Goal: Ability to remain free from injury will improve Outcome: Progressing

## 2017-07-30 ENCOUNTER — Inpatient Hospital Stay (HOSPITAL_COMMUNITY): Payer: Medicare Other

## 2017-07-30 DIAGNOSIS — R079 Chest pain, unspecified: Secondary | ICD-10-CM

## 2017-07-30 DIAGNOSIS — J9602 Acute respiratory failure with hypercapnia: Secondary | ICD-10-CM

## 2017-07-30 DIAGNOSIS — M069 Rheumatoid arthritis, unspecified: Secondary | ICD-10-CM

## 2017-07-30 LAB — BASIC METABOLIC PANEL
Anion gap: 11 (ref 5–15)
BUN: 19 mg/dL (ref 6–20)
CHLORIDE: 98 mmol/L — AB (ref 101–111)
CO2: 30 mmol/L (ref 22–32)
Calcium: 8.5 mg/dL — ABNORMAL LOW (ref 8.9–10.3)
Creatinine, Ser: 0.54 mg/dL (ref 0.44–1.00)
Glucose, Bld: 124 mg/dL — ABNORMAL HIGH (ref 65–99)
Potassium: 4 mmol/L (ref 3.5–5.1)
SODIUM: 139 mmol/L (ref 135–145)

## 2017-07-30 LAB — CBC
HEMATOCRIT: 33.4 % — AB (ref 36.0–46.0)
HEMOGLOBIN: 10.5 g/dL — AB (ref 12.0–15.0)
MCH: 29.8 pg (ref 26.0–34.0)
MCHC: 31.4 g/dL (ref 30.0–36.0)
MCV: 94.9 fL (ref 78.0–100.0)
Platelets: 336 10*3/uL (ref 150–400)
RBC: 3.52 MIL/uL — ABNORMAL LOW (ref 3.87–5.11)
RDW: 16.3 % — ABNORMAL HIGH (ref 11.5–15.5)
WBC: 14.3 10*3/uL — AB (ref 4.0–10.5)

## 2017-07-30 LAB — EXPECTORATED SPUTUM ASSESSMENT W REFEX TO RESP CULTURE

## 2017-07-30 LAB — ECHOCARDIOGRAM COMPLETE
Height: 60 in
Weight: 2751.34 oz

## 2017-07-30 LAB — TROPONIN I: Troponin I: <0.03 ng/mL (ref <0.03)

## 2017-07-30 LAB — EXPECTORATED SPUTUM ASSESSMENT W GRAM STAIN, RFLX TO RESP C: Special Requests: NORMAL

## 2017-07-30 MED ORDER — SODIUM CHLORIDE 0.9 % IV SOLN
500.0000 mg | Freq: Once | INTRAVENOUS | Status: AC
  Administered 2017-07-30: 500 mg via INTRAVENOUS
  Filled 2017-07-30: qty 500

## 2017-07-30 MED ORDER — BOOST / RESOURCE BREEZE PO LIQD CUSTOM
1.0000 | Freq: Two times a day (BID) | ORAL | Status: DC
  Administered 2017-07-30 – 2017-08-02 (×4): 1 via ORAL

## 2017-07-30 NOTE — Evaluation (Signed)
Occupational Therapy Evaluation Patient Details Name: [name] MRN: [id number] DOB: [birth year] Today's Date: 07/30/2017    History of Present Illness 82 y/o female with COPD admitted on 4/8 with acute respiratory failure with hypoxemia due to a COPD exacerbation. Oxygen dependent., HA RA   Clinical Impression   Pt was admitted for the above. At baseline, she is independent with adls/iadls but she does struggle with socks. Will follow in acute setting emphasizing energy conservation/adapted equipment to make ADLs easier for her and promote her independence. Goals are for supervision level     Follow Up Recommendations  Home health OT;Supervision/Assistance - 24 hour    Equipment Recommendations  None recommended by OT    Recommendations for Other Services       Precautions / Restrictions Precautions Precaution Comments: monitor O2 sats, incontinence Restrictions Weight Bearing Restrictions: No      Mobility Bed Mobility Overal bed mobility: Needs Assistance Bed Mobility: Rolling;Sidelying to Sit Rolling: Min guard Sidelying to sit: Min assist       General bed mobility comments: cues for technique  Transfers Overall transfer level: Needs assistance Equipment used: Rolling walker (2 wheeled) Transfers: Sit to/from [facility] Sit to Stand: Min assist;+2 safety/equipment Stand pivot transfers: +2 safety/equipment;Min assist       General transfer comment: steady assist to rise from bed. Small slow steps to  walk to recliner x 3' using RW, in 3 L. [place], Stood again from Psychologist, occupational for [employer].     Balance                                           ADL either performed or assessed with clinical judgement   ADL Overall ADL's : Needs assistance/impaired                         Toilet Transfer: Minimal assistance;Stand-pivot;RW;+2 for safety/equipment(chair)             General ADL Comments: pt is gets 2/4  dyspnea with a little activity:  off bipap this am and on 3 liters.  Min A for UB adls due to dyspnea.  Mod A for LB adls. Pt states she struggles with socks at baseline. She bought a sock aide, which doesn't work for her.  Showed her mine and she tried this. Will have her try again to see if it would work. Needs to try again.  Educated on pursed lip breathing     Vision         Perception     Praxis      Pertinent Vitals/Pain Pain Assessment: Faces Pain Score: 4  Faces Pain Scale: Hurts little more Pain Location: back Pain Descriptors / Indicators: Aching Pain Intervention(s): Limited activity within patient's tolerance;Monitored during session     Hand Dominance Right   Extremity/Trunk Assessment Upper Extremity Assessment Upper Extremity Assessment: (has RA, both shoulders affected; sore)          Communication Communication Communication: No difficulties   Cognition Arousal/Alertness: Awake/alert Behavior During Therapy: WFL for tasks assessed/performed Overall Cognitive Status: Within Functional Limits for tasks assessed                                     General Comments  sats remained in 90s  on 3 liters but 2/4 dyspnea with transfer. Educated on pursed lip breathing    Exercises     Shoulder Instructions      Home Living Family/patient expects to be discharged to:: Private residence Living Arrangements: Other relatives Available Help at Discharge: Family Type of Home: House Home Access: Stairs to enter Technical brewer of Steps: 1   Home Layout: Two level Alternate Level Stairs-Number of Steps: stair glide   Bathroom Shower/Tub: Occupational psychologist: Handicapped height     Home Equipment: Shower seat(has a toilet riser which she hasn't been using)   Additional Comments: 93 YO granddaughter lives w/ patient, sister is supportive      Prior Functioning/Environment Level of Independence: Independent with  assistive device(s)        Comments: sometimes uses AD outside of the house        OT Problem List: Decreased strength;Decreased activity tolerance;Pain;Decreased knowledge of use of DME or AE;Cardiopulmonary status limiting activity;Impaired balance (sitting and/or standing)      OT Treatment/Interventions: Self-care/ADL training;Energy conservation;DME and/or AE instruction;Patient/family education;Balance training;Therapeutic activities    OT Goals(Current goals can be found in the care plan section) Acute Rehab OT Goals Patient Stated Goal: to go home to be  with granddaughter OT Goal Formulation: With patient Time For Goal Achievement: 08/13/17 Potential to Achieve Goals: Good ADL Goals Pt Will Perform Grooming: with supervision;standing Pt Will Perform Lower Body Bathing: with supervision;with adaptive equipment;sit to/from stand Pt Will Perform Lower Body Dressing: with supervision;with adaptive equipment;sit to/from stand Pt Will Transfer to Toilet: with supervision;ambulating;regular height toilet(with riser if needed) Pt Will Perform Toileting - Clothing Manipulation and hygiene: with supervision;sit to/from stand Additional ADL Goal #1: pt will initiate at least one rest break for energy conservation and demonstrate pursed lip breathing without cues for dyspnea  OT Frequency: Min 2X/week   Barriers to D/C:            Co-evaluation PT/OT/SLP Co-Evaluation/Treatment: Yes Reason for Co-Treatment: For patient/therapist safety PT goals addressed during session: Mobility/safety with mobility OT goals addressed during session: ADL's and self-care      AM-PAC PT "6 Clicks" Daily Activity     Outcome Measure Help from another person eating meals?: None Help from another person taking care of personal grooming?: None Help from another person toileting, which includes using toliet, bedpan, or urinal?: None Help from another person bathing (including washing, rinsing,  drying)?: A Lot Help from another person to put on and taking off regular upper body clothing?: A Little Help from another person to put on and taking off regular lower body clothing?: A Lot 6 Click Score: 19   End of Session    Activity Tolerance: Patient tolerated treatment well Patient left: in chair;with call bell/phone within reach;with chair alarm set;with family/visitor present  OT Visit Diagnosis: Muscle weakness (generalized) (M62.81)                Time: 0370-4888 OT Time Calculation (min): 23 min Charges:  OT General Charges $OT Visit: 1 Visit OT Evaluation $OT Eval Low Complexity: 1 Low G-Codes:     [place], OTR/L [phone] 07/30/2017  Zakee Deerman 07/30/2017, 3:20 PM

## 2017-07-30 NOTE — Evaluation (Signed)
Physical Therapy Evaluation Patient Details Name: [name] MRN: [id number] DOB: 1936/03/18 Today's Date: 07/30/2017   History of Present Illness  82 y/o female with COPD admitted on 4/8 with acute respiratory failure with hypoxemia due to a COPD exacerbation. Oxygen dependent., H/O RA  Clinical Impression  The patient tolerated mobilizing to recliner, on 3  Liters [place]. Sats >95%, RR in 30's with activity. Encouraged pursed lip breaths. Patient desires to return home. Pt admitted with above diagnosis. Pt currently with functional limitations due to the deficits listed below (see PT Problem List). Pt will benefit from skilled PT to increase their independence and safety with mobility to allow discharge to the venue listed below.       Follow Up Recommendations Home health PT;Supervision/Assistance - 24 hour    Equipment Recommendations  None recommended by PT    Recommendations for Other Services       Precautions / Restrictions Precautions Precaution Comments: monitor O2 sats, incontinence      Mobility  Bed Mobility Overal bed mobility: Needs Assistance Bed Mobility: Rolling;Sidelying to Sit Rolling: Min guard Sidelying to sit: Min assist       General bed mobility comments: cues for technique  Transfers Overall transfer level: Needs assistance Equipment used: Rolling walker (2 wheeled) Transfers: Sit to/from [facility] Sit to Stand: Min assist;+2 safety/equipment Stand pivot transfers: +2 safety/equipment;Min assist       General transfer comment: steady assist to rise from bed. Small slow steps to  walk to recliner x 3' using RW, in 3 L. [place], Stood again from Psychologist, occupational for [employer].   Ambulation/Gait                Stairs            Wheelchair Mobility    Modified Rankin (Stroke Patients Only)       Balance                                             Pertinent Vitals/Pain Pain Assessment: Faces Faces  Pain Scale: Hurts little more Pain Location: back Pain Descriptors / Indicators: Aching Pain Intervention(s): Monitored during session;Repositioned    Home Living Family/patient expects to be discharged to:: Private residence Living Arrangements: Other relatives Available Help at Discharge: Family Type of Home: House Home Access: Stairs to enter   Technical brewer of Steps: 1 Home Layout: Two level Home Equipment: Environmental consultant - 4 wheels Additional Comments: 67 YO granddaughter lives w/ patient, sister is supportive    Prior Function Level of Independence: Independent with assistive device(s)               Hand Dominance        Extremity/Trunk Assessment        Lower Extremity Assessment Lower Extremity Assessment: Generalized weakness       Communication   Communication: No difficulties  Cognition Arousal/Alertness: Awake/alert Behavior During Therapy: WFL for tasks assessed/performed Overall Cognitive Status: Within Functional Limits for tasks assessed                                        General Comments      Exercises     Assessment/Plan    PT Assessment Patient needs continued PT services  PT Problem List  Decreased strength;Decreased activity tolerance;Cardiopulmonary status limiting activity;Decreased mobility       PT Treatment Interventions DME instruction;Gait training;Functional mobility training;Therapeutic activities;Therapeutic exercise;Patient/family education    PT Goals (Current goals can be found in the Care Plan section)  Acute Rehab PT Goals Patient Stated Goal: to go home to be  with granddaughter PT Goal Formulation: With patient/family Time For Goal Achievement: 08/13/17 Potential to Achieve Goals: Good    Frequency Min 3X/week   Barriers to discharge        Co-evaluation PT/OT/SLP Co-Evaluation/Treatment: Yes Reason for Co-Treatment: For patient/therapist safety PT goals addressed during session:  Mobility/safety with mobility         AM-PAC PT "6 Clicks" Daily Activity  Outcome Measure Difficulty turning over in bed (including adjusting bedclothes, sheets and blankets)?: A Little Difficulty moving from lying on back to sitting on the side of the bed? : A Little Difficulty sitting down on and standing up from a chair with arms (e.g., wheelchair, bedside commode, etc,.)?: A Lot Help needed moving to and from a bed to chair (including a wheelchair)?: A Lot Help needed walking in hospital room?: A Lot Help needed climbing 3-5 steps with a railing? : Total 6 Click Score: 13    End of Session   Activity Tolerance: Treatment limited secondary to medical complications (Comment)(DOE) Patient left: in chair;with call bell/phone within reach;with chair alarm set;with family/visitor present Nurse Communication: Mobility status PT Visit Diagnosis: Unsteadiness on feet (R26.81);Pain    Time: 0932-3557 PT Time Calculation (min) (ACUTE ONLY): 17 min   Charges:   PT Evaluation $PT Eval Low Complexity: 1 Low     PT G CodesTresa Endo PT [phone]  Claretha Cooper 07/30/2017, 2:42 PM

## 2017-07-30 NOTE — Progress Notes (Signed)
PULMONARY / CRITICAL CARE MEDICINE   Name: [name] MRN: [id number] DOB: [birth year]    ADMISSION DATE:  07/29/2017 CONSULTATION DATE:  07/29/2017  REFERRING MD:  Sheran Fava TRH  CHIEF COMPLAINT:  Dyspnea  HISTORY OF PRESENT ILLNESS:   82 y/o female with COPD admitted on 4/8 with acute respiratory failure with hypoxemia due to a COPD exacerbation.   SUBJECTIVE:  Breathing better Wants BIPAP off  VITAL SIGNS: BP (!) 157/67   Pulse 89   Temp 98.3 F (36.8 C) (Axillary)   Resp 20   Ht 5' (1.524 m)   Wt 171 lb 15.3 oz (78 kg)   SpO2 92%   BMI 33.58 kg/m   HEMODYNAMICS:    VENTILATOR SETTINGS: FiO2 (%):  [30 %-40 %] 30 %  INTAKE / OUTPUT: I/O last 3 completed shifts: In: 530 [P.O.:180; IV Piggyback:350] Out: 400 [Urine:400]  PHYSICAL EXAMINATION:  General:  Resting comfortably in bed HENT: NCAT OP clear BIPAP mask in place PULM: Poor  B, normal effort CV: RRR, no mgr GI: BS+, soft, nontender MSK: normal bulk and tone Neuro: awake, alert, no distress, MAEW    LABS:  BMET Recent Labs  Lab 07/27/17 1411 07/29/17 1142 07/30/17 0715  NA 137 136 139  K 4.2 3.9 4.0  CL 97* 95* 98*  CO2 27 28 30   BUN 16 15 19   CREATININE 0.72 0.63 0.54  GLUCOSE 144* 179* 124*    Electrolytes Recent Labs  Lab 07/27/17 1411 07/29/17 1142 07/30/17 0715  CALCIUM 8.8* 8.8* 8.5*    CBC Recent Labs  Lab 07/27/17 1411 07/29/17 1142 07/30/17 0715  WBC 21.7* 21.3* 14.3*  HGB 12.2 11.8* 10.5*  HCT 38.5 38.7 33.4*  PLT 361 395 336    Coag's No results for input(s): APTT, INR in the last 168 hours.  Sepsis Markers Recent Labs  Lab 07/27/17 1418 07/27/17 1607  LATICACIDVEN 0.93 0.95    ABG Recent Labs  Lab 07/29/17 1204  PHART 7.363  PCO2ART 57.0*  PO2ART 133*    Liver Enzymes No results for input(s): AST, ALT, ALKPHOS, BILITOT, ALBUMIN in the last 168 hours.  Cardiac Enzymes Recent Labs  Lab 07/29/17 1822 07/30/17 0029 07/30/17 0715   TROPONINI 0.04* <0.03 <0.03    Glucose No results for input(s): GLUCAP in the last 168 hours.  Imaging Dg Chest Port 1 View  Result Date: 07/30/2017 CLINICAL DATA:  Respiratory failure. EXAM: PORTABLE CHEST 1 VIEW COMPARISON:  07/29/2017. FINDINGS: Mediastinum hilar structures are normal. Right costophrenic angles incompletely imaged. Low lung volumes with mild bibasilar subsegmental atelectasis and/or scarring again noted. Mild biapical pleural-parenchymal thickening again noted consistent scarring. Right costophrenic angle not imaged. Heart size stable. No pulmonary venous congestion. No acute bony abnormality. IMPRESSION: Mild bibasilar subsegmental atelectasis and/or scarring. Mild biapical pleuroparenchymal thickening again noted consistent with scarring. No acute abnormality identified. Electronically Signed   By: Marcello Moores  Register   On: 07/30/2017 07:16   Dg Chest Portable 1 View  Result Date: 07/29/2017 CLINICAL DATA:  Shortness of breath with decreased oxygen saturation EXAM: PORTABLE CHEST 1 VIEW COMPARISON:  July 27, 2017 FINDINGS: There is mild scarring in the apices and left base regions. There is no edema or consolidation. The heart size and pulmonary vascularity are normal. There is aortic atherosclerosis. No adenopathy. No bone lesions. IMPRESSION: Scattered areas of scarring bilaterally. No edema or consolidation. There is aortic atherosclerosis. Aortic Atherosclerosis (ICD10-I70.0). Electronically Signed   By: Lowella Grip III M.D.   On: 07/29/2017  12:05     STUDIES:  4/9 Echo >>   CULTURES: Sputum 4/8 >>   ANTIBIOTICS: Azithromycin 4/8 >>   SIGNIFICANT EVENTS: 4/6-4/7  Admit with AECOPD 4/08  Readmitted with AECOPD  LINES/TUBES:   DISCUSSION: 82 y/o admitted with acute respiratory failure with hypoxemia due to a COPD exacerbation.  ASSESSMENT / PLAN:  PULMONARY A: AE COPD Acute respiratory failure with hypoxemia ?LLL infiltrate P:   Follow up  Echo Wean off BIPAP Maintain O2 saturation > 90% with supplemental O2 Continue bronchodilators Continue solumedrol Repeat 2 view CXR in AM Monitor in SDU  Rest as per [facility]   Roselie Awkward, MD [place] [facility] Pager: [phone] Cell: [phone] After 3pm or if no response, call [phone]   07/30/2017, 10:17 AM

## 2017-07-30 NOTE — Progress Notes (Signed)
[facility] PROGRESS NOTE  [name] YQI:[id number] DOB: 08/13/1935 DOA: 07/29/2017  PCP: [name], MD  Brief History/Interval Summary: 82 year old Caucasian female with a past medical history of COPD on home oxygen as needed, coronary artery disease, GERD, obstructive sleep apnea but not on CPAP was recently discharged after being managed for acute COPD exacerbation.  She initially did feel better but then her shortness of breath worsened at home.  She had to come back to the hospital and was hospitalized.  She was placed on BiPAP.  Pulmonology was consulted.  Reason for Visit: Acute COPD exacerbation  Consultants: Pulmonology  Procedures: None  Antibiotics: Azithromycin  Subjective/Interval History: She is on BiPAP.  She continues to have a cough with yellowish expectoration.  Denies any chest pain.  Still having shortness of breath.  ROS: Some nausea but no vomiting  Objective:  Vital Signs  Vitals:   07/30/17 0500 07/30/17 0721 07/30/17 0725 07/30/17 0748  BP:  (!) 157/67    Pulse:  89    Resp:  20    Temp:    98.3 F (36.8 C)  TempSrc:    Axillary  SpO2:  92% 92%   Weight: 78 kg (171 lb 15.3 oz)     Height:        Intake/Output Summary (Last 24 hours) at 07/30/2017 1025 Last data filed at 07/30/2017 0600 Gross per 24 hour  Intake 530 ml  Output 400 ml  Net 130 ml   Filed Weights   07/29/17 1909 07/30/17 0500  Weight: 79.8 kg (175 lb 14.8 oz) 78 kg (171 lb 15.3 oz)    General appearance: alert, cooperative, appears stated age and no distress Head: Normocephalic, without obvious abnormality, atraumatic Resp: Scattered wheezing heard bilaterally anteriorly.  Coarse breath sounds.  Mildly tachypneic at rest.  No use of accessory muscles.  Few crackles at the bases. Cardio: regular rate and rhythm, S1, S2 normal, no murmur, click, rub or gallop GI: soft, non-tender; bowel sounds normal; no masses,  no organomegaly Extremities: extremities  normal, atraumatic, no cyanosis or edema Neurologic: Awake and alert.  Oriented x3.  No obvious focal neurological deficits.  Lab Results:  Data Reviewed: I have personally reviewed following labs and imaging studies  CBC: Recent Labs  Lab 07/27/17 1411 07/29/17 1142 07/30/17 0715  WBC 21.7* 21.3* 14.3*  HGB 12.2 11.8* 10.5*  HCT 38.5 38.7 33.4*  MCV 92.8 96.5 94.9  PLT 361 395 387    Basic Metabolic Panel: Recent Labs  Lab 07/27/17 1411 07/29/17 1142 07/30/17 0715  NA 137 136 139  K 4.2 3.9 4.0  CL 97* 95* 98*  CO2 27 28 30   GLUCOSE 144* 179* 124*  BUN 16 15 19   CREATININE 0.72 0.63 0.54  CALCIUM 8.8* 8.8* 8.5*    GFR: Estimated Creatinine Clearance: 52.7 mL/min (by C-G formula based on SCr of 0.54 mg/dL).  Cardiac Enzymes: Recent Labs  Lab 07/29/17 1822 07/30/17 0029 07/30/17 0715  TROPONINI 0.04* <0.03 <0.03     Recent Results (from the past 240 hour(s))  Culture, blood (routine x 2)     Status: None (Preliminary result)   Collection Time: 07/27/17  3:02 PM  Result Value Ref Range Status   Specimen Description   Final    BLOOD BLOOD RIGHT HAND Performed at [facility], [place] [address], [place], [place] [address]    Special Requests   Final    BOTTLES DRAWN AEROBIC AND ANAEROBIC Blood Culture adequate  volume Performed at [facility], [place] [address] [place], [place] [address]    Culture   Final    NO GROWTH 2 DAYS Performed at [place] [address] [place], [place] [address]    Report Status PENDING  Incomplete  Culture, blood (routine x 2)     Status: None (Preliminary result)   Collection Time: 07/27/17  3:02 PM  Result Value Ref Range Status   Specimen Description   Final    BLOOD RIGHT ANTECUBITAL Performed at [place] [address] [place], [place] [address]    Special Requests   Final    BOTTLES DRAWN AEROBIC AND ANAEROBIC Blood Culture adequate  volume Performed at [place] [address], [place], [place] [address]    Culture   Final    NO GROWTH 2 DAYS Performed at [place] [address], [place], [place] [address]    Report Status PENDING  Incomplete  Culture, respiratory (NON-Expectorated)     Status: None (Preliminary result)   Collection Time: 07/28/17  7:30 AM  Result Value Ref Range Status   Specimen Description   Final    SPUTUM Performed at [place] [address], [place], [place] [address]    Special Requests   Final    NONE Performed at [facility], [place] [address], [place], [place] [address]    Gram Stain   Final    FEW WBC PRESENT, PREDOMINANTLY PMN RARE SQUAMOUS EPITHELIAL CELLS PRESENT MODERATE GRAM POSITIVE COCCI IN PAIRS RARE GRAM POSITIVE RODS FEW GRAM NEGATIVE RODS    Culture   Final    CULTURE REINCUBATED FOR BETTER GROWTH Performed at [facility], [place] [address] [place], [place] [address]    Report Status PENDING  Incomplete  MRSA PCR Screening     Status: Abnormal   Collection Time: 07/29/17  5:48 PM  Result Value Ref Range Status   MRSA by PCR POSITIVE (A) NEGATIVE Final    Comment:        The GeneXpert MRSA Assay (FDA approved for NASAL specimens only), is one component of a comprehensive MRSA colonization surveillance program. It is not intended to diagnose MRSA infection nor to guide or monitor treatment for MRSA infections. RESULT CALLED TO, READ BACK BY AND VERIFIED WITH: Vanita Ingles @2018  07/29/17 MKELLY Performed at [facility], [place] [address], [place], [place] [address]       Radiology Studies: Dg Chest Port 1 View  Result Date: 07/30/2017 CLINICAL DATA:  Respiratory failure. EXAM: PORTABLE CHEST 1 VIEW COMPARISON:  07/29/2017. FINDINGS: Mediastinum hilar structures are normal. Right costophrenic angles incompletely imaged. Low lung volumes with mild bibasilar  subsegmental atelectasis and/or scarring again noted. Mild biapical pleural-parenchymal thickening again noted consistent scarring. Right costophrenic angle not imaged. Heart size stable. No pulmonary venous congestion. No acute bony abnormality. IMPRESSION: Mild bibasilar subsegmental atelectasis and/or scarring. Mild biapical pleuroparenchymal thickening again noted consistent with scarring. No acute abnormality identified. Electronically Signed   By: Marcello Moores  Register   On: 07/30/2017 07:16   Dg Chest Portable 1 View  Result Date: 07/29/2017 CLINICAL DATA:  Shortness of breath with decreased oxygen saturation EXAM: PORTABLE CHEST 1 VIEW COMPARISON:  July 27, 2017 FINDINGS: There is mild scarring in the apices and left base regions. There is no edema or consolidation. The heart size and pulmonary vascularity are normal. There is aortic atherosclerosis. No adenopathy.  No bone lesions. IMPRESSION: Scattered areas of scarring bilaterally. No edema or consolidation. There is aortic atherosclerosis. Aortic Atherosclerosis (ICD10-I70.0). Electronically Signed   By: Lowella Grip III M.D.   On: 07/29/2017 12:05     Medications:  Scheduled: . aspirin EC  81 mg Oral Daily  . atorvastatin  40 mg Oral q morning - 10a  . Chlorhexidine Gluconate Cloth  6 each Topical Q0600  . enoxaparin (LOVENOX) injection  40 mg Subcutaneous Q24H  . gabapentin  200 mg Oral BID WC  . gabapentin  300 mg Oral QHS  . guaiFENesin  1,200 mg Oral BID  . hydroxychloroquine  200 mg Oral Daily  . ipratropium-albuterol  3 mL Nebulization Q4H  . loratadine  10 mg Oral Daily  . meloxicam  15 mg Oral Daily  . methylPREDNISolone (SOLU-MEDROL) injection  60 mg Intravenous Q6H  . mupirocin ointment  1 application Nasal BID  . pantoprazole  40 mg Oral Daily  . sertraline  100 mg Oral Daily   Continuous: . azithromycin    . cefTRIAXone (ROCEPHIN)  IV Stopped (07/29/17 1832)   XFG:[id number] **OR** acetaminophen, albuterol,  bisacodyl, LORazepam, magnesium citrate, ondansetron **OR** ondansetron (ZOFRAN) IV, polyethylene glycol, polyvinyl alcohol  Assessment/Plan:  Principal Problem:   Acute on chronic respiratory failure with hypoxia (HCC) Active Problems:   HLD (hyperlipidemia)   COPD with acute exacerbation (HCC)   Rheumatoid arthritis (HCC)   Neuropathy    Acute on chronic respiratory failure with hypoxia This is secondary to COPD exacerbation.  Patient was placed on BiPAP and hospitalized to the stepdown unit.  Continue with systemic steroids, duo nebs, azithromycin.  Pulmonology has been consulted as well.  Acute COPD exacerbation See discussion above as well.  Patient seems to be slightly better.  Continue treatment as outlined.  Pulmonology is following.  Chest pressure Most likely due to her respiratory issues. EKG does not show any ischemic changes.  Troponins normal. Continue to monitor for now.  Echocardiogram was ordered and is pending.  History of coronary artery disease Stable.  Continue aspirin and statin.  Not on beta-blocker but not to be initiated for now due to her acute respiratory illness.  History of peripheral neuropathy Stable.  Continue home medications.  Noted to be on gabapentin.  History of rheumatoid arthritis Stable.  Continue Plaquenil and methotrexate and meloxicam.  History of GERD Continue PPI.  DVT Prophylaxis: Lovenox    Code Status: Full code Family Communication: Discussed with the patient Disposition Plan: Management as outlined above.  Await improvement in respiratory status.    LOS: 1 day   [place] [facility] Pager [phone] 07/30/2017, 10:25 AM  If 7PM-7AM, please contact night-coverage at [email or web address], password [facility]

## 2017-07-30 NOTE — Progress Notes (Signed)
OT Cancellation Note  Patient Details Name: JESSALYNN MCCOWAN MRN: [id number] DOB: [birth year]   Cancelled Treatment:    Reason Eval/Treat Not Completed: Other (comment). Pt currently on bipap.  Willc heck back.    Annise Boran 07/30/2017, 8:19 AM  Lesle Chris, OTR/L [phone] 07/30/2017

## 2017-07-30 NOTE — Progress Notes (Signed)
  Echocardiogram 2D Echocardiogram has been performed.  [name] 07/30/2017, 9:50 AM

## 2017-07-30 NOTE — Plan of Care (Signed)
  Problem: Nutrition: Goal: Adequate nutrition will be maintained Outcome: Progressing   Problem: Elimination: Goal: Will not experience complications related to bowel motility Outcome: Progressing   Problem: Safety: Goal: Ability to remain free from injury will improve Outcome: Progressing   

## 2017-07-30 NOTE — Progress Notes (Signed)
Initial Nutrition Assessment  DOCUMENTATION CODES:   Obesity unspecified  INTERVENTION:   -Provide Boost Breeze po BID, each supplement provides 250 kcal and 9 grams of protein -When diet is advanced, provide Ensure Enlive po BID, each supplement provides 350 kcal and 20 grams of protein  NUTRITION DIAGNOSIS:   Increased nutrient needs related to (COPD) as evidenced by estimated needs.  GOAL:   Patient will meet greater than or equal to 90% of their needs  MONITOR:   PO intake, Supplement acceptance, Labs, Weight trends, I & O's, Skin  REASON FOR ASSESSMENT:   Consult COPD Protocol  ASSESSMENT:   82 y/o female with COPD admitted on 4/8 with acute respiratory failure with hypoxemia due to a COPD exacerbation.  Pt with family at bedside. Pt reports her appetite has been poor but was unable to tell RD how long. Pt could not state when she last ate or what she had at that time. She guesses she last ate on Friday 4/5. Pt feels hungry today, now on clear liquids. Pt is willing to try Boost Breeze supplements and once diet is advanced would like strawberry Ensure supplements as she drinks these at home.   Per chart review, pt has lost 6 lb since 1/25 (3% wt loss x 2.5 months, insignificant for time frame).  Labs reviewed. Medications reviewed.   NUTRITION - FOCUSED PHYSICAL EXAM:  Nutrition focused physical exam shows no sign of depletion of muscle mass or body fat.  Diet Order:  Diet clear liquid Room service appropriate? Yes; Fluid consistency: Thin  EDUCATION NEEDS:   No education needs have been identified at this time  Skin:  Skin Assessment: Skin Integrity Issues: Skin Integrity Issues:: Other (Comment) Other: right buttocks wound  Last BM:  4/6  Height:   Ht Readings from Last 1 Encounters:  07/29/17 5' (1.524 m)    Weight:   Wt Readings from Last 1 Encounters:  07/30/17 171 lb 15.3 oz (78 kg)    Ideal Body Weight:  45.5 kg  BMI:  Body mass index is  33.58 kg/m.  Estimated Nutritional Needs:   Kcal:  1900-2100  Protein:  65-75g  Fluid:  2L/day  Clayton Bibles, MS, RD, LDN [place] [facility] Pager: [phone] After Hours Pager: [phone]

## 2017-07-30 NOTE — Progress Notes (Signed)
PT Cancellation Note  Patient Details Name: NIRA VISSCHER MRN: [id number] DOB: [birth year]   Cancelled Treatment:    Reason Eval/Treat Not Completed: Medical issues which prohibited therapy  Patient on BiPAP at this time. Will  check back another time. Claretha Cooper 07/30/2017, 8:19 AM  Tresa Endo PT [phone]

## 2017-07-30 NOTE — Care Management Note (Signed)
Case Management Note  Patient Details  Name: SANGITA ZANI MRN: [id number] Date of Birth: 11/24/1935  Subjective/Objective:81 y/o f admitted w/Acute on chronic resp failure w/hypoxia. From home. 3 adm in past 6 months-CM referral for COPD protocal-will monitor. PT cons-await recc.                    Action/Plan:d/c  Plan home.   Expected Discharge Date:  (unknown)               Expected Discharge Plan:  Home/Self Care  In-House Referral:     Discharge planning Services  CM Consult  Post Acute Care Choice:    Choice offered to:     DME Arranged:    DME Agency:     HH Arranged:    HH Agency:     Status of Service:  In process, will continue to follow  If discussed at Long Length of Stay Meetings, dates discussed:    Additional Comments:  Dessa Phi, RN 07/30/2017, 2:18 PM

## 2017-07-31 ENCOUNTER — Inpatient Hospital Stay (HOSPITAL_COMMUNITY): Payer: Medicare Other

## 2017-07-31 DIAGNOSIS — J9601 Acute respiratory failure with hypoxia: Secondary | ICD-10-CM

## 2017-07-31 LAB — CULTURE, RESPIRATORY W GRAM STAIN: Culture: NORMAL

## 2017-07-31 LAB — CBC
HEMATOCRIT: 35.1 % — AB (ref 36.0–46.0)
HEMOGLOBIN: 11 g/dL — AB (ref 12.0–15.0)
MCH: 29.7 pg (ref 26.0–34.0)
MCHC: 31.3 g/dL (ref 30.0–36.0)
MCV: 94.9 fL (ref 78.0–100.0)
Platelets: 399 10*3/uL (ref 150–400)
RBC: 3.7 MIL/uL — AB (ref 3.87–5.11)
RDW: 16.3 % — ABNORMAL HIGH (ref 11.5–15.5)
WBC: 13.6 10*3/uL — AB (ref 4.0–10.5)

## 2017-07-31 LAB — BASIC METABOLIC PANEL
ANION GAP: 13 (ref 5–15)
BUN: 19 mg/dL (ref 6–20)
CALCIUM: 8.7 mg/dL — AB (ref 8.9–10.3)
CO2: 31 mmol/L (ref 22–32)
Chloride: 96 mmol/L — ABNORMAL LOW (ref 101–111)
Creatinine, Ser: 0.66 mg/dL (ref 0.44–1.00)
GFR calc non Af Amer: >60 mL/min (ref >60)
Glucose, Bld: 173 mg/dL — ABNORMAL HIGH (ref 65–99)
POTASSIUM: 4 mmol/L (ref 3.5–5.1)
Sodium: 140 mmol/L (ref 135–145)

## 2017-07-31 MED ORDER — METHYLPREDNISOLONE SODIUM SUCC 125 MG IJ SOLR: 60.0000 mg | Freq: Two times a day (BID) | INTRAMUSCULAR | Status: DC

## 2017-07-31 MED ORDER — METHYLPREDNISOLONE SODIUM SUCC 40 MG IJ SOLR
40.0000 mg | Freq: Two times a day (BID) | INTRAMUSCULAR | Status: AC
  Administered 2017-07-31 – 2017-08-01 (×3): 40 mg via INTRAVENOUS
  Filled 2017-07-31 (×3): qty 1

## 2017-07-31 MED ORDER — LACTULOSE 10 GM/15ML PO SOLN
30.0000 g | Freq: Once | ORAL | Status: AC
  Administered 2017-07-31: 30 g via ORAL
  Filled 2017-07-31: qty 45

## 2017-07-31 MED ORDER — IPRATROPIUM-ALBUTEROL 0.5-2.5 (3) MG/3ML IN SOLN
3.0000 mL | Freq: Three times a day (TID) | RESPIRATORY_TRACT | Status: DC
  Administered 2017-08-01 – 2017-08-02 (×5): 3 mL via RESPIRATORY_TRACT
  Filled 2017-07-31 (×5): qty 3

## 2017-07-31 MED ORDER — POLYETHYLENE GLYCOL 3350 17 G PO PACK
17.0000 g | PACK | Freq: Every day | ORAL | Status: DC
  Administered 2017-07-31: 17 g via ORAL
  Filled 2017-07-31 (×3): qty 1

## 2017-07-31 NOTE — Clinical Social Work Note (Addendum)
Clinical Social Work Assessment  Patient Details  Name: [name] MRN: [id number] Date of Birth: [birth year]  Date of referral:  07/31/17               Reason for consult:  Other (COPD Protocol)                Permission sought to share information with:    Permission granted to share information::     Name::       [facility]  Agency::     Relationship::    Sister  Contact Information:    [phone]  Housing/Transportation Living arrangements for the past 2 months:  Single Family Home Source of Information:  Patient Patient Interpreter Needed:  None Criminal Activity/Legal Involvement Pertinent to Current Situation/Hospitalization:  No - Comment as needed Significant Relationships:  Warehouse manager, Other Family Members   Lives with:  Minor Children, Other (Comment)(Son in law/ Granddaugter) Do you feel safe going back to the place where you live?  No Need for family participation in patient care:  No  Care giving concerns:   COPD Protocol (3 admission in the past 6 months)  Social Worker assessment / plan:  CSW met with patient her niece at bedside, explained csw role and reason for visit. Patient receptive to CSW and agreeable to complete depression screening tool. Patient express COPD has "changed my life". Patient express her COPD has worsened over the past three years. At baseline she uses a wheel chair and walker outside her home and uses a chair lift to get upstairs to her bed room.   Re: Depression Screening PHQ9  CSW inquired how often during the past 2 weeks patient felt bothered. Patient reports she has been sick for the past two weeks and reports feeling down at times too tired to do things. When she is feeling good she volunteers at [employer].  She also enjoys doing activities with her granddaughter.  -Patient reports at night she has difficulty sleeping due to frequent urination and getting up. The patient states she has a good appetite, she  has never been a "big eater."Patient reports she has noticed over in the past year that she is more forgetful, often "completely going blank" when conversing. Patient feels this maybe due to age.   CSW completed Depression screening and patient scored 14. Patient is not interested in any therapeutic outpatient follow up. She talked about her faith and church community as her support. Patient reports she lives with her granddaughter and son in [place]. She moved in with them about three years after her daughter died. Patient reports "I have buried 3 of my 6 children." She reports she has three daughters, one lives in [place], [place], [place] and [place]. Despite the far distance between her children she expressed support from her children, sister and niece.     Employment status:  Retired Forensic scientist:  Medicare PT Recommendations:  Home with [place] / Referral to community resources:     Patient/Family's Response to care: Patient appreciative of CSW services.   Patient/Family's Understanding of and Emotional Response to Diagnosis, Current Treatment, and Prognosis: Patient has a good understanding of her diagnosis and symptoms that follow. Patient expressed support from her community and family.   Emotional Assessment Appearance:  Appears stated age Attitude/Demeanor/Rapport:    Affect (typically observed):  Accepting Orientation:  Oriented to Place, Oriented to Self, Oriented to  Time, Oriented to Situation Alcohol / Substance use:  Not Applicable  Psych involvement (Current and /or in the community):  No (Comment)  Discharge Needs  Concerns to be addressed:  Discharge Planning Concerns Readmission within the last 30 days:  No Current discharge risk:  Dependent with Mobility Barriers to Discharge:  No Barriers Identified   Lia Hopping, LCSW 07/31/2017, 12:30 PM

## 2017-07-31 NOTE — Progress Notes (Signed)
Patient Demographics:    [name], is a 82 y.o. female, DOB - 11-01-1935, KCL:[id number]  Admit date - 07/29/2017   Admitting Physician Janece Canterbury, MD  Outpatient Primary MD for the patient is [name], MD  LOS - 2   Chief Complaint  Patient presents with  . Shortness of Breath        Subjective:    [name] today has no fevers, no emesis,  No chest pain, shortness of  breath is better, off bipap,   Assessment  & Plan :    Principal Problem:   Acute respiratory failure with hypoxia (HCC) Active Problems:   HLD (hyperlipidemia)   COPD with acute exacerbation (HCC)   Rheumatoid arthritis (HCC)   Neuropathy   Acute respiratory failure with hypercapnia (HCC)   Reason for Visit: Acute COPD exacerbation  Brief History/Interval Summary: 82 year old Caucasian female with a past medical history of COPD on home oxygen as needed, coronary artery disease, GERD, obstructive sleep apnea but not on CPAP was recently discharged after being managed for acute COPD exacerbation.  She initially did feel better but then her shortness of breath worsened at home.  She had to come back to the hospital and was hospitalized.  She was placed on BiPAP.  Pulmonology was consulted.    Plan:- 1) Gold stage III COPD with acute exacerbation: Overall improving respiratory symptoms,recent Flu panel is negative, chest x-ray w/o definite pneumonia, pulmonology consult appreciated, wean down steroids, continue bronchodilators and mucolytics, continue supplemental oxygen, off Bipap   2)Acute on chronic respiratory failure with hypoxia-secondary to #1 above, patient is now off BiPAP, treated as above #1, prior to admission she was on 2-3 L of oxygen via nasal cannula  3)History of coronary artery disease- Stable.  No ACS sxs, Continue aspirin and statin.  Not on beta-blocker but not to be initiated for now  due to her acute respiratory illness.  4)History of peripheral neuropathy-  Stable.  Continue home medications.  Noted to be on gabapentin.  5)History of rheumatoid arthritis Stable.  Continue Plaquenil and methotrexate , hold Meloxicam while on high-dose steroids  6)History of GERD Continue PPI.  DVT Prophylaxis: Lovenox    Code Status: Full code Family Communication: Discussed with the patient Disposition Plan:  Home with [place]   STUDIES: 4/9 Echo >>   CULTURES: Sputum 4/8 >>  ANTIBIOTICS: Azithromycin 4/8 >>  SIGNIFICANT EVENTS: 4/6-4/7 Admit with AECOPD 4/08 Readmitted with AECOPD  Consultants: Pulmonology  Procedures: None  Antibiotics: Azithromycin/rocephin  DVT Prophylaxis  :  Lovenox  Lab Results  Component Value Date   PLT 399 07/31/2017    Inpatient Medications  Scheduled Meds: . aspirin EC  81 mg Oral Daily  . atorvastatin  40 mg Oral q morning - 10a  . Chlorhexidine Gluconate Cloth  6 each Topical Q0600  . enoxaparin (LOVENOX) injection  40 mg Subcutaneous Q24H  . feeding supplement  1 Container Oral BID BM  . gabapentin  200 mg Oral BID WC  . gabapentin  300 mg Oral QHS  . guaiFENesin  1,200 mg Oral BID  . hydroxychloroquine  200 mg Oral Daily  . ipratropium-albuterol  3 mL Nebulization Q4H  . loratadine  10 mg Oral Daily  . methylPREDNISolone (SOLU-MEDROL) injection  60 mg Intravenous Q12H  . mupirocin ointment  1 application Nasal BID  . pantoprazole  40 mg Oral Daily  . polyethylene glycol  17 g Oral Daily  . sertraline  100 mg Oral Daily   Continuous Infusions: . azithromycin Stopped (07/30/17 2128)  . cefTRIAXone (ROCEPHIN)  IV Stopped (07/30/17 1815)   PRN Meds:.acetaminophen **OR** acetaminophen, albuterol, bisacodyl, LORazepam, magnesium citrate, ondansetron **OR** ondansetron (ZOFRAN) IV, polyvinyl alcohol    Anti-infectives (From admission, onward)   Start     Dose/Rate Route Frequency Ordered Stop   07/30/17  2100  azithromycin (ZITHROMAX) 500 mg in sodium chloride 0.9 % 250 mL IVPB     500 mg 250 mL/hr over 60 Minutes Intravenous Every 24 hours 07/29/17 2038     07/30/17 0030  azithromycin (ZITHROMAX) 500 mg in sodium chloride 0.9 % 250 mL IVPB     500 mg 250 mL/hr over 60 Minutes Intravenous  Once 07/30/17 0016 07/30/17 0125   07/29/17 1830  hydroxychloroquine (PLAQUENIL) tablet 200 mg     200 mg Oral Daily 07/29/17 1447     07/29/17 1700  cefTRIAXone (ROCEPHIN) 2 g in sodium chloride 0.9 % 100 mL IVPB     2 g 200 mL/hr over 30 Minutes Intravenous Every 24 hours 07/29/17 1611     07/29/17 1500  azithromycin (ZITHROMAX) 500 mg in sodium chloride 0.9 % 250 mL IVPB  Status:  Discontinued     500 mg 250 mL/hr over 60 Minutes Intravenous Every 24 hours 07/29/17 1447 07/29/17 2038        Objective:   Vitals:   07/31/17 0851 07/31/17 1000 07/31/17 1200 07/31/17 1345  BP:  (!) 159/67    Pulse:  87 (!) 116   Resp:  19 18   Temp:   98 F (36.7 C)   TempSrc:   Oral   SpO2: 97% 97% 93% 95%  Weight:      Height:        Wt Readings from Last 3 Encounters:  07/31/17 79.9 kg (176 lb 2.4 oz)  07/27/17 79.8 kg (176 lb)  05/17/17 80.5 kg (177 lb 7.5 oz)     Intake/Output Summary (Last 24 hours) at 07/31/2017 1447 Last data filed at 07/31/2017 1400 Gross per 24 hour  Intake 890 ml  Output 1100 ml  Net -210 ml     Physical Exam  Gen:- Awake Alert, no acute distress HEENT:- [place].AT, No sclera icterus Nose- 3 L/min Neck-Supple Neck,No JVD,.  Lungs-decreased breath sounds, scattered wheezes bilaterally CV- S1, S2 normal Abd-  +ve B.Sounds, Abd Soft, No tenderness,    Extremity/Skin:- No  edema, good pulses Psych-affect is appropriate, oriented x3 Neuro-no new focal deficits, no tremors   Data Review:   Micro Results Recent Results (from the past 240 hour(s))  Culture, blood (routine x 2)     Status: None (Preliminary result)   Collection Time: 07/27/17  3:02 PM  Result Value Ref  Range Status   Specimen Description   Final    BLOOD BLOOD RIGHT HAND Performed at [place] [address], [place],  [address]    Special Requests   Final    BOTTLES DRAWN AEROBIC AND ANAEROBIC Blood Culture adequate volume Performed at  [facility], [place] [address] [place], [place] [address]    Culture   Final    NO GROWTH 3 DAYS Performed at [facility], [place] [address], [place], [place] [address]    Report Status PENDING  Incomplete  Culture, blood (routine x 2)     Status: None (Preliminary result)   Collection Time: 07/27/17  3:02 PM  Result Value Ref Range Status   Specimen Description   Final    BLOOD RIGHT ANTECUBITAL Performed at [facility] [address], [place], [place] [address]    Special Requests   Final    BOTTLES DRAWN AEROBIC AND ANAEROBIC Blood Culture adequate volume Performed at [facility] [address], [place], [place] [address]    Culture   Final    NO GROWTH 3 DAYS Performed at [facility], [place] [address], [place], [place] [address]    Report Status PENDING  Incomplete  Culture, respiratory (NON-Expectorated)     Status: None   Collection Time: 07/28/17  7:30 AM  Result Value Ref Range Status   Specimen Description   Final    SPUTUM Performed at [place] [address] [place], [place] [address]    Special Requests   Final    NONE Performed at [facility], [place] [address], [place], [place] [address]    Gram Stain   Final    FEW WBC PRESENT, PREDOMINANTLY PMN RARE SQUAMOUS EPITHELIAL CELLS PRESENT MODERATE GRAM POSITIVE COCCI IN PAIRS RARE GRAM POSITIVE RODS FEW GRAM NEGATIVE RODS    Culture   Final    ABUNDANT Consistent with normal respiratory flora. Performed at [facility], [place] [address], [place], [place] [address]    Report Status 07/31/2017 FINAL  Final  MRSA PCR Screening      Status: Abnormal   Collection Time: 07/29/17  5:48 PM  Result Value Ref Range Status   MRSA by PCR POSITIVE (A) NEGATIVE Final    Comment:        The GeneXpert MRSA Assay (FDA approved for NASAL specimens only), is one component of a comprehensive MRSA colonization surveillance program. It is not intended to diagnose MRSA infection nor to guide or monitor treatment for MRSA infections. RESULT CALLED TO, READ BACK BY AND VERIFIED WITH: Vanita Ingles @2018  07/29/17 MKELLY Performed at [facility], [place] [address] [place], [place] [address]   Culture, expectorated sputum-assessment     Status: None   Collection Time: 07/30/17 12:01 PM  Result Value Ref Range Status   Specimen Description EXPECTORATED SPUTUM  Final   Special Requests Normal  Final   Sputum evaluation   Final    THIS SPECIMEN IS ACCEPTABLE FOR SPUTUM CULTURE Performed at [facility], [place] [address] [place], [place] [address]    Report Status 07/30/2017 FINAL  Final  Culture, respiratory (NON-Expectorated)     Status: None (Preliminary result)   Collection Time: 07/30/17 12:01 PM  Result Value Ref Range Status   Specimen Description   Final    EXPECTORATED SPUTUM Performed at [facility] [address], [place], [place] [address]    Special Requests   Final    Normal Reflexed from [phone] Performed at [facility] [address], [place], [place] [address]    Gram Stain   Final    FEW WBC PRESENT,BOTH PMN AND MONONUCLEAR FEW GRAM NEGATIVE RODS  FEW GRAM POSITIVE COCCI FEW GRAM VARIABLE ROD ABUNDANT SQUAMOUS EPITHELIAL CELLS PRESENT    Culture   Final    CULTURE REINCUBATED FOR BETTER GROWTH Performed at [facility], [place] [address], [place], [place] [address]    Report Status PENDING  Incomplete    Radiology Reports Dg Chest 2 View  Result Date: 07/31/2017 CLINICAL DATA:  Shortness of Breath EXAM: CHEST - 2 VIEW  COMPARISON:  July 30, 2017 FINDINGS: There is no edema or consolidation. The heart size and pulmonary vascularity are normal. No adenopathy. There is degenerative change in the thoracic spine. IMPRESSION: No edema or consolidation. Electronically Signed   By: Lowella Grip III M.D.   On: 07/31/2017 13:23   Dg Chest 2 View  Result Date: 07/27/2017 CLINICAL DATA:  Nausea and vomiting.  Productive cough.  Chest pain. EXAM: CHEST - 2 VIEW COMPARISON:  05/13/2017 FINDINGS: Artifact overlies the chest. Heart size is normal. Chronic aortic atherosclerosis. No evidence of infiltrate, effusion or collapse. Ordinary degenerative changes affect the spine. There may be central bronchial thickening. IMPRESSION: Possible bronchitis pattern.  No consolidation or collapse. Electronically Signed   By: Nelson Chimes M.D.   On: 07/27/2017 15:01   Dg Chest Port 1 View  Result Date: 07/30/2017 CLINICAL DATA:  Respiratory failure. EXAM: PORTABLE CHEST 1 VIEW COMPARISON:  07/29/2017. FINDINGS: Mediastinum hilar structures are normal. Right costophrenic angles incompletely imaged. Low lung volumes with mild bibasilar subsegmental atelectasis and/or scarring again noted. Mild biapical pleural-parenchymal thickening again noted consistent scarring. Right costophrenic angle not imaged. Heart size stable. No pulmonary venous congestion. No acute bony abnormality. IMPRESSION: Mild bibasilar subsegmental atelectasis and/or scarring. Mild biapical pleuroparenchymal thickening again noted consistent with scarring. No acute abnormality identified. Electronically Signed   By: Marcello Moores  Register   On: 07/30/2017 07:16   Dg Chest Portable 1 View  Result Date: 07/29/2017 CLINICAL DATA:  Shortness of breath with decreased oxygen saturation EXAM: PORTABLE CHEST 1 VIEW COMPARISON:  July 27, 2017 FINDINGS: There is mild scarring in the apices and left base regions. There is no edema or consolidation. The heart size and pulmonary vascularity are  normal. There is aortic atherosclerosis. No adenopathy. No bone lesions. IMPRESSION: Scattered areas of scarring bilaterally. No edema or consolidation. There is aortic atherosclerosis. Aortic Atherosclerosis (ICD10-I70.0). Electronically Signed   By: Lowella Grip III M.D.   On: 07/29/2017 12:05     CBC Recent Labs  Lab 07/27/17 1411 07/29/17 1142 07/30/17 0715 07/31/17 0344  WBC 21.7* 21.3* 14.3* 13.6*  HGB 12.2 11.8* 10.5* 11.0*  HCT 38.5 38.7 33.4* 35.1*  PLT 361 395 336 399  MCV 92.8 96.5 94.9 94.9  MCH 29.4 29.4 29.8 29.7  MCHC 31.7 30.5 31.4 31.3  RDW 16.2* 16.8* 16.3* 16.3*    Chemistries  Recent Labs  Lab 07/27/17 1411 07/29/17 1142 07/30/17 0715 07/31/17 0344  NA 137 136 139 140  K 4.2 3.9 4.0 4.0  CL 97* 95* 98* 96*  CO2 27 28 30 31   GLUCOSE 144* 179* 124* 173*  BUN 16 15 19 19   CREATININE 0.72 0.63 0.54 0.66  CALCIUM 8.8* 8.8* 8.5* 8.7*   ------------------------------------------------------------------------------------------------------------------ No results for input(s): CHOL, HDL, LDLCALC, TRIG, CHOLHDL, LDLDIRECT in the last 72 hours.  Lab Results  Component Value Date   HGBA1C 6.3 (H) 05/21/2016   ------------------------------------------------------------------------------------------------------------------ No results for input(s): TSH, T4TOTAL, T3FREE, THYROIDAB in the last 72 hours.  Invalid input(s): FREET3 ------------------------------------------------------------------------------------------------------------------ No results for input(s): VITAMINB12, FOLATE, FERRITIN,  TIBC, IRON, RETICCTPCT in the last 72 hours.  Coagulation profile No results for input(s): INR, PROTIME in the last 168 hours.  No results for input(s): DDIMER in the last 72 hours.  Cardiac Enzymes Recent Labs  Lab 07/29/17 1822 07/30/17 0029 07/30/17 0715  TROPONINI 0.04* <0.03 <0.03    ------------------------------------------------------------------------------------------------------------------    Component Value Date/Time   BNP 237.7 (H) 07/29/2017 1142     Michae Grimley M.D on 07/31/2017 at 2:47 PM  Between 7am to 7pm - Pager - [phone]  After 7pm go to [email or web address] - password [email or web address]  [facility] -  Office  [phone]   Voice Recognition Viviann Spare dictation system was used to create this note, attempts have been made to correct errors. Please contact the author with questions and/or clarifications.

## 2017-07-31 NOTE — Progress Notes (Signed)
Occupational Therapy Treatment Patient Details Name: [name] MRN: [id number] DOB: 26-Apr-1935 Today's Date: 07/31/2017    History of present illness 82 y/o female with COPD admitted on 4/8 with acute respiratory failure with hypoxemia due to a COPD exacerbation. Oxygen dependent., HA RA   OT comments  Ambulated to bathroom,used toilet and completed bathing.  Pt fatiques easily. Encouraged rest breaks. Will try AE on next visit.  Pt did have one LOB when performing toilet hygiene  Follow Up Recommendations  Home health OT;Supervision/Assistance - 24 hour    Equipment Recommendations  None recommended by OT    Recommendations for Other Services      Precautions / Restrictions Precautions Precautions: Fall Precaution Comments: monitor O2 sats, incontinence Restrictions Weight Bearing Restrictions: No       Mobility Bed Mobility               General bed mobility comments: supine to sit min guard; HOB raised  Transfers   Equipment used: Rolling walker (2 wheeled)   Sit to Stand: Min assist;+2 safety/equipment;Min guard         General transfer comment: min guard from bed; min A from commode.      Balance                                           ADL either performed or assessed with clinical judgement   ADL Overall ADL's : Needs assistance/impaired     Grooming: Wash/dry hands;Wash/dry face;Set up;Sitting Grooming Details (indicate cue type and reason): unable to set things within reach from commode Upper Body Bathing: Minimal assistance;Sitting   Lower Body Bathing: Moderate assistance;Sit to/from stand   Upper Body Dressing : Minimal assistance;Sitting Upper Body Dressing Details (indicate cue type and reason): lines     Toilet Transfer: Minimal assistance;Ambulation;RW;Comfort height toilet   Toileting- Clothing Manipulation and Hygiene: Minimal assistance;Sit to/from stand         General ADL Comments: ambulated to  bathroom; RN assisted with lines/02. Pt completed ADL from commode. One LOB when performing hygiene.  HR up to 130.  02 in 90s on 2 liters:  lost wave line once. Dyspnea 2/4 and multiple coughing episodes.  Encouraged rest breaks.  Will try AE on next visit     Vision       Perception     Praxis      Cognition Arousal/Alertness: Awake/alert Behavior During Therapy: WFL for tasks assessed/performed Overall Cognitive Status: Within Functional Limits for tasks assessed                                          Exercises     Shoulder Instructions       General Comments      Pertinent Vitals/ Pain       Pain Assessment: No/denies pain  Home Living                                          Prior Functioning/Environment              Frequency  Min 2X/week        Progress Toward Goals  OT Goals(current goals can  now be found in the care plan section)  Progress towards OT goals: Progressing toward goals     Plan      Co-evaluation                 AM-PAC PT "6 Clicks" Daily Activity     Outcome Measure   Help from another person eating meals?: None Help from another person taking care of personal grooming?: A Little Help from another person toileting, which includes using toliet, bedpan, or urinal?: A Little Help from another person bathing (including washing, rinsing, drying)?: A Lot Help from another person to put on and taking off regular upper body clothing?: A Little Help from another person to put on and taking off regular lower body clothing?: A Lot 6 Click Score: 17    End of Session    OT Visit Diagnosis: Muscle weakness (generalized) (M62.81)   Activity Tolerance Patient tolerated treatment well   Patient Left in chair;with call bell/phone within reach;with chair alarm set;with family/visitor present   Nurse Communication          Time: 4599-7741 OT Time Calculation (min): 27 min  Charges: OT  General Charges $OT Visit: 1 Visit OT Treatments $Self Care/Home Management : 23-37 mins  Lesle Chris, OTR/L [phone] 07/31/2017   [place] 07/31/2017, 12:33 PM

## 2017-07-31 NOTE — Progress Notes (Signed)
PULMONARY / CRITICAL CARE MEDICINE   Name: [name] MRN: [id number] DOB: January 07, 1936    ADMISSION DATE:  07/29/2017 CONSULTATION DATE:  07/29/2017  REFERRING MD:  Sheran Fava TRH  CHIEF COMPLAINT:  Dyspnea  HISTORY OF PRESENT ILLNESS:   82 y/o female with COPD admitted on 4/8 with acute respiratory failure with hypoxemia due to a COPD exacerbation.   SUBJECTIVE:  Pt reports feeling some better but not good.  Continues to cough up thick green/yellow sputum.  Denies fevers.    VITAL SIGNS: BP (!) 172/79 (BP Location: Right Arm)   Pulse 88   Temp 98.4 F (36.9 C) (Oral)   Resp (!) 23   Ht 5' (1.524 m)   Wt 176 lb 2.4 oz (79.9 kg)   SpO2 97%   BMI 34.40 kg/m   HEMODYNAMICS:    VENTILATOR SETTINGS:    INTAKE / OUTPUT: I/O last 3 completed shifts: In: 62 [P.O.:180; IV Piggyback:600] Out: 900 [Urine:900]  PHYSICAL EXAMINATION: General:  Elderly female in NAD lying in bed, niece at bedside HEENT: MM pink/moist, no jvd, O2 via [place] Neuro: AAOx4, speech clear, MAE  CV: s1s2 rrr, no m/r/g PULM: mild work of breathing but not labored, exp wheezing (improved) XB:LTJQ, non-tender, bsx4 active  Extremities: warm/dry, no edema  Skin: no rashes or lesions   LABS:  BMET Recent Labs  Lab 07/29/17 1142 07/30/17 0715 07/31/17 0344  NA 136 139 140  K 3.9 4.0 4.0  CL 95* 98* 96*  CO2 28 30 31   BUN 15 19 19   CREATININE 0.63 0.54 0.66  GLUCOSE 179* 124* 173*    Electrolytes Recent Labs  Lab 07/29/17 1142 07/30/17 0715 07/31/17 0344  CALCIUM 8.8* 8.5* 8.7*    CBC Recent Labs  Lab 07/29/17 1142 07/30/17 0715 07/31/17 0344  WBC 21.3* 14.3* 13.6*  HGB 11.8* 10.5* 11.0*  HCT 38.7 33.4* 35.1*  PLT 395 336 399    Coag's No results for input(s): APTT, INR in the last 168 hours.  Sepsis Markers Recent Labs  Lab 07/27/17 1418 07/27/17 1607  LATICACIDVEN 0.93 0.95    ABG Recent Labs  Lab 07/29/17 1204  PHART 7.363  PCO2ART 57.0*  PO2ART 133*     Liver Enzymes No results for input(s): AST, ALT, ALKPHOS, BILITOT, ALBUMIN in the last 168 hours.  Cardiac Enzymes Recent Labs  Lab 07/29/17 1822 07/30/17 0029 07/30/17 0715  TROPONINI 0.04* <0.03 <0.03    Glucose No results for input(s): GLUCAP in the last 168 hours.  Imaging No results found.   STUDIES:  ECHO 4/9 >> normal LV, moderate LVH, estimated LVEF 30-09%, grade 1 diastolic dysfunction, trivial MR, can not exclude a patent foramen ovale, dilated IVC  CULTURES: Sputum 4/8 >>   ANTIBIOTICS: Azithromycin 4/8 >>   SIGNIFICANT EVENTS: 4/6-4/7  Admit with AECOPD 4/08  Readmitted with AECOPD  LINES/TUBES:   DISCUSSION: 82 y/o admitted with acute respiratory failure with hypoxemia due to a COPD exacerbation.  ASSESSMENT / PLAN:  PULMONARY A: AE COPD Acute respiratory failure with hypoxemia ?LLL infiltrate P:   ECHO as above  Reduce solumedrol to 60 mg IV Q12, taper to off as able  Duoneb to Q4 with Q3 PRN albuterol  Monitor in SDU  Assess 2V CXR 4/10  BiPAP PRN for WOB  Will need pulmonary follow up at discharge (Dr. [name])  [name], NP-C [place] Pgr: [phone] or if no answer [phone] 07/31/2017, 10:01 AM

## 2017-08-01 LAB — CULTURE, BLOOD (ROUTINE X 2)
Culture: NO GROWTH
Culture: NO GROWTH
SPECIAL REQUESTS: ADEQUATE
Special Requests: ADEQUATE

## 2017-08-01 MED ORDER — PREDNISONE 20 MG PO TABS
40.0000 mg | ORAL_TABLET | Freq: Every day | ORAL | Status: DC
  Administered 2017-08-02: 40 mg via ORAL
  Filled 2017-08-01: qty 2

## 2017-08-01 MED ORDER — AZITHROMYCIN 250 MG PO TABS
500.0000 mg | ORAL_TABLET | Freq: Every day | ORAL | Status: DC
  Administered 2017-08-01: 500 mg via ORAL
  Filled 2017-08-01: qty 2

## 2017-08-01 NOTE — Care Management Important Message (Signed)
Important Message  Patient Details  Name: [name] MRN: [id number] Date of Birth: 26-May-1935   Medicare Important Message Given:  Yes    Kerin Salen 08/01/2017, 10:30 AMImportant Message  Patient Details  Name: [name] MRN: [id number] Date of Birth: 07/17/1935   Medicare Important Message Given:  Yes    Kerin Salen 08/01/2017, 10:29 AM

## 2017-08-01 NOTE — Care Management Note (Signed)
Case Management Note  Patient Details  Name: [name] MRN: [id number] Date of Birth: March 01, 1936  Subjective/Objective: PT/OT-recc HHC-provided patient w/HHC agency list await choice.MD notified for HHPT/HHOT order, & face to face.                   Action/Plan:d/c home w/HHC.   Expected Discharge Date:  (unknown)               Expected Discharge Plan:  [place]  In-House Referral:     Discharge planning Services  CM Consult  Post Acute Care Choice:  Durable Medical Equipment([employer]-home 02-has travel tank, rollator) Choice offered to:  Patient  DME Arranged:    DME Agency:     HH Arranged:    [place] Agency:     Status of Service:  In process, will continue to follow  If discussed at Long Length of Stay Meetings, dates discussed:    Additional Comments:  Dessa Phi, RN 08/01/2017, 1:36 PM

## 2017-08-01 NOTE — Progress Notes (Signed)
Physical Therapy Treatment Patient Details Name: [name] MRN: [id number] DOB: Jan 07, 1936 Today's Date: 08/01/2017    History of Present Illness 82 y/o female with COPD admitted on 4/8 with acute respiratory failure with hypoxemia due to a COPD exacerbation. Oxygen dependent., HA RA    PT Comments    The patient  Ambulated x 60' x 2 with 3  Liters [place]. [place] dropped to low 80's after stopping. Returned to 94% with rest. Continue PT   Follow Up Recommendations  Home health PT;Supervision/Assistance - 24 hour     Equipment Recommendations  None recommended by PT    Recommendations for Other Services       Precautions / Restrictions Precautions Precautions: Fall Precaution Comments: monitor O2 sats, incontinence    Mobility  Bed Mobility Overal bed mobility: Needs Assistance     Sidelying to sit: Min assist       General bed mobility comments: supine to sit min guard; HOB raised  Transfers Overall transfer level: Needs assistance Equipment used: Rolling walker (2 wheeled) Transfers: Sit to/from Stand Sit to Stand: Min assist;+2 safety/equipment;Min guard         General transfer comment: min guard from bed;   Ambulation/Gait Ambulation/Gait assistance: Min assist;+2 safety/equipment Ambulation Distance (Feet): 60 Feet(x2) Assistive device: Rolling walker (2 wheeled) Gait Pattern/deviations: Step-through pattern     General Gait Details: patient's sats drop to low 80%  after stopping. On 3 liters   Stairs            Engineer, building services [place] (Stroke Patients Only)       Balance                                            Cognition Arousal/Alertness: Awake/alert                                            Exercises      General Comments        Pertinent Vitals/Pain Pain Assessment: No/denies pain    Home Living                      Prior Function             PT Goals (current goals can now be found in the care plan section) Progress towards PT goals: Progressing toward goals    Frequency    Min 3X/week      PT Plan Current plan remains appropriate    Co-evaluation              AM-PAC PT "6 Clicks" Daily Activity  Outcome Measure  Difficulty turning over in bed (including adjusting bedclothes, sheets and blankets)?: A Little Difficulty moving from lying on back to sitting on the side of the bed? : A Little Difficulty sitting down on and standing up from a chair with arms (e.g., wheelchair, bedside commode, etc,.)?: A Little Help needed moving to and from a bed to chair (including a wheelchair)?: A Little Help needed walking in hospital room?: A Little Help needed climbing 3-5 steps with a railing? : Total 6 Click Score: 16    End of Session Equipment Utilized During Treatment: Oxygen Activity Tolerance: Patient tolerated treatment well Patient  left: in chair;with call bell/phone within reach;with family/visitor present Nurse Communication: Mobility status       Time: 5361-4431 PT Time Calculation (min) (ACUTE ONLY): 34 min  Charges:  $Gait Training: 23-37 mins                    G CodesTresa [name] PT [phone]   [name] 08/01/2017, 3:47 PM

## 2017-08-01 NOTE — Progress Notes (Signed)
PHARMACIST - PHYSICIAN COMMUNICATION  CONCERNING: Antibiotic IV to Oral Route Change Policy  RECOMMENDATION: This patient is receiving azithromycin by the intravenous route.  Based on criteria approved by the Pharmacy and Therapeutics Committee, the antibiotic(s) is/are being converted to the equivalent oral dose form(s).   DESCRIPTION: These criteria include:  Patient being treated for a respiratory tract infection, urinary tract infection, cellulitis or clostridium difficile associated diarrhea if on metronidazole  The patient is not neutropenic and does not exhibit a GI malabsorption state  The patient is eating (either orally or via tube) and/or has been taking other orally administered medications for a least 24 hours  The patient is improving clinically and has a Tmax < 100.5  If you have questions about this conversion, please contact the Pharmacy Department  []   [phone] )  [facility] []   [phone] )  [facility] []   [phone] )  [facility] []   [phone] )  [facility] []   [phone] )  [place], [place].D. [phone] 08/01/2017 11:53 AM

## 2017-08-01 NOTE — Progress Notes (Signed)
Patient Demographics:    [name], is a 82 y.o. female, DOB - [birth year], XQJ:[id number]  Admit date - 07/29/2017   Admitting Physician Janece Canterbury, MD  Outpatient Primary MD for the patient is [name], MD  LOS - 3   Chief Complaint  Patient presents with  . Shortness of Breath        Subjective:    [name] today has no fevers, no emesis,  No chest pain, shortness of  breath is better, off bipap, patient is somewhat grumpy this morning, unhappy about possible discharge  Assessment  & Plan :    Principal Problem:   Acute respiratory failure with hypoxia ([place]) Active Problems:   HLD (hyperlipidemia)   COPD with acute exacerbation (HCC)   Rheumatoid arthritis (HCC)   Neuropathy   Acute respiratory failure with hypercapnia (HCC)   Reason for Visit: Acute COPD exacerbation  Brief History/Interval Summary: 82 year old Caucasian female with a past medical history of COPD on home oxygen as needed, coronary artery disease, GERD, obstructive sleep apnea but not on CPAP was recently discharged after being managed for acute COPD exacerbation.  She initially did feel better but then her shortness of breath worsened at home.  She had to come back to the hospital and was hospitalized.  She was placed on BiPAP.  Pulmonology was consulted.    Plan:- 1) Gold stage III COPD with acute exacerbation: Overall improving respiratory symptoms,recent Flu panel is negative, chest x-ray w/o definite pneumonia, pulmonology consult appreciated, wean down steroids, will transition to prednisone in a.m. continue bronchodilators and mucolytics, continue supplemental oxygen, off Bipap   2)Acute on chronic respiratory failure with hypoxia-secondary to #1 above, patient is now off BiPAP, treated as above #1, prior to admission she was on 2-3 L of oxygen via nasal cannula  3)History of coronary artery  disease- Stable.  No ACS sxs, Continue aspirin and statin.  Not on beta-blocker but not to be initiated for now due to her acute respiratory illness.  4)History of peripheral neuropathy-  Stable.  Continue home medications.  Noted to be on gabapentin.  5)History of rheumatoid arthritis Stable.  Continue Plaquenil and methotrexate , hold Meloxicam while on high-dose steroids  6)History of GERD Continue PPI.  7)Social-patient apparently lives with her son-in-law and 64 year old granddaughter, she is worried that she does not really have much help at home, discussed with case manager, plan is to discharge patient home with home health services  DVT Prophylaxis: Lovenox    Code Status: Full code Family Communication: Discussed with the patient Disposition Plan:  Home with [facility]   STUDIES: 4/9 Echo >>   CULTURES: Sputum 4/8 >>  ANTIBIOTICS: Azithromycin 4/8 >>  SIGNIFICANT EVENTS: 4/6-4/7 Admit with AECOPD 4/08 Readmitted with AECOPD  Consultants: Pulmonology  Procedures: None  Antibiotics: Azithromycin/rocephin  DVT Prophylaxis  :  Lovenox  Lab Results  Component Value Date   PLT 399 07/31/2017    Inpatient Medications  Scheduled Meds: . aspirin EC  81 mg Oral Daily  .  atorvastatin  40 mg Oral q morning - 10a  . azithromycin  500 mg Oral QHS  . Chlorhexidine Gluconate Cloth  6 each Topical Q0600  . enoxaparin (LOVENOX) injection  40 mg Subcutaneous Q24H  . feeding supplement  1 Container Oral BID BM  . gabapentin  200 mg Oral BID WC  . gabapentin  300 mg Oral QHS  . guaiFENesin  1,200 mg Oral BID  . hydroxychloroquine  200 mg Oral Daily  . ipratropium-albuterol  3 mL Nebulization TID  . loratadine  10 mg Oral Daily  . methylPREDNISolone (SOLU-MEDROL) injection  40 mg Intravenous Q12H  . mupirocin ointment  1 application Nasal BID  . pantoprazole  40 mg Oral Daily  . polyethylene glycol  17 g Oral Daily  . [START ON 08/02/2017] predniSONE  40 mg  Oral Q breakfast  . sertraline  100 mg Oral Daily   Continuous Infusions: . cefTRIAXone (ROCEPHIN)  IV Stopped (07/31/17 1830)   PRN Meds:.acetaminophen **OR** acetaminophen, albuterol, bisacodyl, LORazepam, magnesium citrate, ondansetron **OR** ondansetron (ZOFRAN) IV, polyvinyl alcohol    Anti-infectives (From admission, onward)   Start     Dose/Rate Route Frequency Ordered Stop   08/01/17 2200  azithromycin (ZITHROMAX) tablet 500 mg     500 mg Oral Daily at bedtime 08/01/17 1152     07/30/17 2100  azithromycin (ZITHROMAX) 500 mg in sodium chloride 0.9 % 250 mL IVPB  Status:  Discontinued     500 mg 250 mL/hr over 60 Minutes Intravenous Every 24 hours 07/29/17 2038 08/01/17 1152   07/30/17 0030  azithromycin (ZITHROMAX) 500 mg in sodium chloride 0.9 % 250 mL IVPB     500 mg 250 mL/hr over 60 Minutes Intravenous  Once 07/30/17 0016 07/30/17 0125   07/29/17 1830  hydroxychloroquine (PLAQUENIL) tablet 200 mg     200 mg Oral Daily 07/29/17 1447     07/29/17 1700  cefTRIAXone (ROCEPHIN) 2 g in sodium chloride 0.9 % 100 mL IVPB     2 g 200 mL/hr over 30 Minutes Intravenous Every 24 hours 07/29/17 1611     07/29/17 1500  azithromycin (ZITHROMAX) 500 mg in sodium chloride 0.9 % 250 mL IVPB  Status:  Discontinued     500 mg 250 mL/hr over 60 Minutes Intravenous Every 24 hours 07/29/17 1447 07/29/17 2038        Objective:   Vitals:   08/01/17 0605 08/01/17 0853 08/01/17 1330 08/01/17 1353  BP: (!) 162/86  111/78   Pulse: 82  93   Resp: 19  19   Temp: 97.9 F (36.6 C)  98.1 F (36.7 C)   TempSrc: Oral  Oral   SpO2: 98% 92% 95% 96%  Weight:      Height:        Wt Readings from Last 3 Encounters:  08/01/17 79.8 kg (175 lb 14.8 oz)  07/27/17 79.8 kg (176 lb)  05/17/17 80.5 kg (177 lb 7.5 oz)     Intake/Output Summary (Last 24 hours) at 08/01/2017 1732 Last data filed at 08/01/2017 0606 Gross per 24 hour  Intake 350 ml  Output 500 ml  Net -150 ml     Physical  Exam  Gen:- Awake Alert, no acute distress HEENT:- [place].AT, No sclera icterus Nose- 3 L/min Neck-Supple Neck,No JVD,.  Lungs-decreased breath sounds, scattered wheezes bilaterally CV- S1, S2 normal Abd-  +ve B.Sounds, Abd Soft, No tenderness,    Extremity/Skin:- No  edema, good pulses Psych-affect is appropriate, oriented x3 Neuro-no new  focal deficits, no tremors   Data Review:   Micro Results Recent Results (from the past 240 hour(s))  Culture, blood (routine x 2)     Status: None   Collection Time: 07/27/17  3:02 PM  Result Value Ref Range Status   Specimen Description   Final    BLOOD BLOOD RIGHT HAND Performed at [facility] [address], [place], [place] [address]    Special Requests   Final    BOTTLES DRAWN AEROBIC AND ANAEROBIC Blood Culture adequate volume Performed at [facility] [place] [facility] [address], [place], [place] [address]    Culture   Final    NO GROWTH 5 DAYS Performed at [facility], [place] [address] [place], [place] [address]    Report Status 08/01/2017 FINAL  Final  Culture, blood (routine x 2)     Status: None   Collection Time: 07/27/17  3:02 PM  Result Value Ref Range Status   Specimen Description   Final    BLOOD RIGHT ANTECUBITAL Performed at [facility] [address] [place], [place] [address]    Special Requests   Final    BOTTLES DRAWN AEROBIC AND ANAEROBIC Blood Culture adequate volume Performed at [facility] [address], [place], [place] [address]    Culture   Final    NO GROWTH 5 DAYS Performed at [facility], [place] [address] [place], [place] [address]    Report Status 08/01/2017 FINAL  Final  Culture, respiratory (NON-Expectorated)     Status: None   Collection Time: 07/28/17  7:30 AM  Result Value Ref Range Status   Specimen Description   Final    SPUTUM Performed at [facility] [address], [place], [place]  [address]    Special Requests   Final    NONE Performed at [facility], [place] [address] [place], [place] [address]    Gram Stain   Final    FEW WBC PRESENT, PREDOMINANTLY PMN RARE SQUAMOUS EPITHELIAL CELLS PRESENT MODERATE GRAM POSITIVE COCCI IN PAIRS RARE GRAM POSITIVE RODS FEW GRAM NEGATIVE RODS    Culture   Final    ABUNDANT Consistent with normal respiratory flora. Performed at [facility], [place] [address], [place], [place] [address]    Report Status 07/31/2017 FINAL  Final  MRSA PCR Screening     Status: Abnormal   Collection Time: 07/29/17  5:48 PM  Result Value Ref Range Status   MRSA by PCR POSITIVE (A) NEGATIVE Final    Comment:        The GeneXpert MRSA Assay (FDA approved for NASAL specimens only), is one component of a comprehensive MRSA colonization surveillance program. It is not intended to diagnose MRSA infection nor to guide or monitor treatment for MRSA infections. RESULT CALLED TO, READ BACK BY AND VERIFIED WITH: Vanita Ingles @2018  07/29/17 MKELLY Performed at [facility], [place] [address], [place], [place] [address]   Culture, expectorated sputum-assessment     Status: None   Collection Time: 07/30/17 12:01 PM  Result Value Ref Range Status   Specimen Description EXPECTORATED SPUTUM  Final   Special Requests Normal  Final   Sputum evaluation   Final    THIS SPECIMEN IS ACCEPTABLE FOR SPUTUM CULTURE Performed at [facility], [place] [address] [place], [place] [address]    Report Status 07/30/2017 FINAL  Final  Culture, respiratory (NON-Expectorated)  Status: None (Preliminary result)   Collection Time: 07/30/17 12:01 PM  Result Value Ref Range Status   Specimen Description   Final    EXPECTORATED SPUTUM Performed at [facility] [address], [place], [place] [address]    Special Requests   Final    Normal Reflexed from [id number] Performed at [facility] [address] [place],  [address]    Gram Stain   Final    FEW WBC PRESENT,BOTH PMN AND MONONUCLEAR FEW GRAM NEGATIVE RODS FEW GRAM POSITIVE COCCI FEW GRAM VARIABLE ROD ABUNDANT SQUAMOUS EPITHELIAL CELLS PRESENT    Culture   Final    CULTURE REINCUBATED FOR BETTER GROWTH Performed at [facility], [place] [address] [place],  [address]    Report Status PENDING  Incomplete    Radiology Reports Dg Chest 2 View  Result Date: 07/31/2017 CLINICAL DATA:  Shortness of Breath EXAM: CHEST - 2 VIEW COMPARISON:  July 30, 2017 FINDINGS: There is no edema or consolidation. The heart size and pulmonary vascularity are normal. No adenopathy. There is degenerative change in the thoracic spine. IMPRESSION: No edema or consolidation. Electronically Signed   By: Lowella Grip III M.D.   On: 07/31/2017 13:23   Dg Chest 2 View  Result Date: 07/27/2017 CLINICAL DATA:  Nausea and vomiting.  Productive cough.  Chest pain. EXAM: CHEST - 2 VIEW COMPARISON:  05/13/2017 FINDINGS: Artifact overlies the chest. Heart size is normal. Chronic aortic atherosclerosis. No evidence of infiltrate, effusion or collapse. Ordinary degenerative changes affect the spine. There may be central bronchial thickening. IMPRESSION: Possible bronchitis pattern.  No consolidation or collapse. Electronically Signed   By: Nelson Chimes M.D.   On: 07/27/2017 15:01   Dg Chest Port 1 View  Result Date: 07/30/2017 CLINICAL DATA:  Respiratory failure. EXAM: PORTABLE CHEST 1 VIEW COMPARISON:  07/29/2017. FINDINGS: Mediastinum hilar structures are normal. Right costophrenic angles incompletely imaged. Low lung volumes with mild bibasilar subsegmental atelectasis and/or scarring again noted. Mild biapical pleural-parenchymal thickening again noted consistent scarring. Right costophrenic angle not imaged. Heart size stable. No pulmonary venous congestion. No acute bony abnormality. IMPRESSION: Mild bibasilar subsegmental  atelectasis and/or scarring. Mild biapical pleuroparenchymal thickening again noted consistent with scarring. No acute abnormality identified. Electronically Signed   By: Marcello Moores  Register   On: 07/30/2017 07:16   Dg Chest Portable 1 View  Result Date: 07/29/2017 CLINICAL DATA:  Shortness of breath with decreased oxygen saturation EXAM: PORTABLE CHEST 1 VIEW COMPARISON:  July 27, 2017 FINDINGS: There is mild scarring in the apices and left base regions. There is no edema or consolidation. The heart size and pulmonary vascularity are normal. There is aortic atherosclerosis. No adenopathy. No bone lesions. IMPRESSION: Scattered areas of scarring bilaterally. No edema or consolidation. There is aortic atherosclerosis. Aortic Atherosclerosis (ICD10-I70.0). Electronically Signed   By: Lowella Grip III M.D.   On: 07/29/2017 12:05     CBC Recent Labs  Lab 07/27/17 1411 07/29/17 1142 07/30/17 0715 07/31/17 0344  WBC 21.7* 21.3* 14.3* 13.6*  HGB 12.2 11.8* 10.5* 11.0*  HCT 38.5 38.7 33.4* 35.1*  PLT 361 395 336 399  MCV 92.8 96.5 94.9 94.9  MCH 29.4 29.4 29.8 29.7  MCHC 31.7 30.5 31.4 31.3  RDW 16.2* 16.8* 16.3* 16.3*    Chemistries  Recent Labs  Lab 07/27/17 1411 07/29/17 1142 07/30/17 0715 07/31/17 0344  NA 137 136 139 140  K 4.2 3.9 4.0 4.0  CL 97* 95*  98* 96*  CO2 27 28 30 31   GLUCOSE 144* 179* 124* 173*  BUN 16 15 19 19   CREATININE 0.72 0.63 0.54 0.66  CALCIUM 8.8* 8.8* 8.5* 8.7*   ------------------------------------------------------------------------------------------------------------------ No results for input(s): CHOL, HDL, LDLCALC, TRIG, CHOLHDL, LDLDIRECT in the last 72 hours.  Lab Results  Component Value Date   HGBA1C 6.3 (H) 05/21/2016   ------------------------------------------------------------------------------------------------------------------ No results for input(s): TSH, T4TOTAL, T3FREE, THYROIDAB in the last 72 hours.  Invalid input(s):  FREET3 ------------------------------------------------------------------------------------------------------------------ No results for input(s): VITAMINB12, FOLATE, FERRITIN, TIBC, IRON, RETICCTPCT in the last 72 hours.  Coagulation profile No results for input(s): INR, PROTIME in the last 168 hours.  No results for input(s): DDIMER in the last 72 hours.  Cardiac Enzymes Recent Labs  Lab 07/29/17 1822 07/30/17 0029 07/30/17 0715  TROPONINI 0.04* <0.03 <0.03   ------------------------------------------------------------------------------------------------------------------    Component Value Date/Time   BNP 237.7 (H) 07/29/2017 1142     Roxan Hockey M.D on 08/01/2017 at 5:32 PM  Between 7am to 7pm - Pager - [phone]  After 7pm go to [email or web address] - password [email or web address]  [facility] -  Office  [phone]   Voice Recognition Viviann Spare dictation system was used to create this note, attempts have been made to correct errors. Please contact the author with questions and/or clarifications.

## 2017-08-02 LAB — CBC
HCT: 40.3 % (ref 36.0–46.0)
HEMOGLOBIN: 12.5 g/dL (ref 12.0–15.0)
MCH: 29.5 pg (ref 26.0–34.0)
MCHC: 31 g/dL (ref 30.0–36.0)
MCV: 95 fL (ref 78.0–100.0)
PLATELETS: 401 10*3/uL — AB (ref 150–400)
RBC: 4.24 MIL/uL (ref 3.87–5.11)
RDW: 16 % — AB (ref 11.5–15.5)
WBC: 13.6 10*3/uL — ABNORMAL HIGH (ref 4.0–10.5)

## 2017-08-02 LAB — BASIC METABOLIC PANEL
Anion gap: 12 (ref 5–15)
BUN: 18 mg/dL (ref 6–20)
CALCIUM: 8.7 mg/dL — AB (ref 8.9–10.3)
CO2: 37 mmol/L — ABNORMAL HIGH (ref 22–32)
CREATININE: 0.68 mg/dL (ref 0.44–1.00)
Chloride: 92 mmol/L — ABNORMAL LOW (ref 101–111)
GFR calc non Af Amer: >60 mL/min (ref >60)
Glucose, Bld: 99 mg/dL (ref 65–99)
Potassium: 3.2 mmol/L — ABNORMAL LOW (ref 3.5–5.1)
SODIUM: 141 mmol/L (ref 135–145)

## 2017-08-02 LAB — CULTURE, RESPIRATORY W GRAM STAIN
Culture: NORMAL
Special Requests: NORMAL

## 2017-08-02 LAB — CULTURE, RESPIRATORY

## 2017-08-02 MED ORDER — BISACODYL 5 MG PO TBEC: 5.0000 mg | DELAYED_RELEASE_TABLET | Freq: Every day | ORAL | 0 refills | Status: DC | PRN

## 2017-08-02 MED ORDER — ACETAMINOPHEN 325 MG PO TABS: 650.0000 mg | ORAL_TABLET | Freq: Four times a day (QID) | ORAL | 1 refills | Status: DC | PRN

## 2017-08-02 MED ORDER — POLYETHYLENE GLYCOL 3350 17 G PO PACK: 17.0000 g | PACK | Freq: Every day | ORAL | 1 refills | Status: AC

## 2017-08-02 MED ORDER — BUDESONIDE-FORMOTEROL FUMARATE 160-4.5 MCG/ACT IN AERO: 2.0000 | INHALATION_SPRAY | Freq: Two times a day (BID) | RESPIRATORY_TRACT | 12 refills | Status: DC

## 2017-08-02 MED ORDER — CYANOCOBALAMIN 1000 MCG PO TABS: 1000.0000 ug | ORAL_TABLET | Freq: Every day | ORAL | 2 refills | Status: DC

## 2017-08-02 MED ORDER — CETIRIZINE HCL 10 MG PO TABS: 10.0000 mg | ORAL_TABLET | Freq: Every morning | ORAL | 2 refills | Status: AC

## 2017-08-02 MED ORDER — ONDANSETRON HCL 4 MG PO TABS: 4.0000 mg | ORAL_TABLET | Freq: Four times a day (QID) | ORAL | 0 refills | Status: DC | PRN

## 2017-08-02 MED ORDER — POTASSIUM CHLORIDE CRYS ER 20 MEQ PO TBCR: 40.0000 meq | EXTENDED_RELEASE_TABLET | Freq: Once | ORAL | Status: DC

## 2017-08-02 MED ORDER — FOLIC ACID 1 MG PO TABS: 1.0000 mg | ORAL_TABLET | Freq: Every day | ORAL | 1 refills | Status: AC

## 2017-08-02 MED ORDER — GABAPENTIN 100 MG PO CAPS: 200.0000 mg | ORAL_CAPSULE | Freq: Three times a day (TID) | ORAL | 1 refills | Status: AC

## 2017-08-02 MED ORDER — POLYVINYL ALCOHOL 1.4 % OP SOLN: 2.0000 [drp] | OPHTHALMIC | 0 refills | Status: DC | PRN

## 2017-08-02 MED ORDER — POTASSIUM CHLORIDE CRYS ER 20 MEQ PO TBCR
40.0000 meq | EXTENDED_RELEASE_TABLET | Freq: Once | ORAL | Status: AC
  Administered 2017-08-02: 40 meq via ORAL
  Filled 2017-08-02: qty 2

## 2017-08-02 MED ORDER — AZITHROMYCIN 250 MG PO TABS: 250.0000 mg | ORAL_TABLET | Freq: Every day | ORAL | 0 refills | Status: AC

## 2017-08-02 MED ORDER — HYDRALAZINE HCL 20 MG/ML IJ SOLN: 10.0000 mg | Freq: Four times a day (QID) | INTRAMUSCULAR | Status: DC | PRN

## 2017-08-02 MED ORDER — ESOMEPRAZOLE MAGNESIUM 40 MG PO CPDR: 40.0000 mg | DELAYED_RELEASE_CAPSULE | Freq: Every morning | ORAL | 2 refills | Status: DC

## 2017-08-02 MED ORDER — ALBUTEROL SULFATE (2.5 MG/3ML) 0.083% IN NEBU: 2.5000 mg | INHALATION_SOLUTION | Freq: Three times a day (TID) | RESPIRATORY_TRACT | 12 refills | Status: DC

## 2017-08-02 MED ORDER — ENSURE ENLIVE PO LIQD
237.0000 mL | Freq: Two times a day (BID) | ORAL | Status: DC
  Administered 2017-08-02: 237 mL via ORAL

## 2017-08-02 MED ORDER — ENSURE ENLIVE PO LIQD: 237.0000 mL | Freq: Two times a day (BID) | ORAL | 12 refills | Status: DC

## 2017-08-02 MED ORDER — TIOTROPIUM BROMIDE MONOHYDRATE 18 MCG IN CAPS
18.0000 ug | ORAL_CAPSULE | Freq: Every morning | RESPIRATORY_TRACT | 12 refills | Status: DC
Start: 2017-08-02 — End: 2021-07-21

## 2017-08-02 MED ORDER — MUPIROCIN 2 % EX OINT: 1.0000 "application " | TOPICAL_OINTMENT | Freq: Two times a day (BID) | CUTANEOUS | 0 refills | Status: DC

## 2017-08-02 MED ORDER — CEFDINIR 300 MG PO CAPS: 300.0000 mg | ORAL_CAPSULE | Freq: Two times a day (BID) | ORAL | 0 refills | Status: AC

## 2017-08-02 MED ORDER — PREDNISONE 20 MG PO TABS: 40.0000 mg | ORAL_TABLET | Freq: Every day | ORAL | 0 refills | Status: DC

## 2017-08-02 MED ORDER — GUAIFENESIN ER 600 MG PO TB12: 600.0000 mg | ORAL_TABLET | Freq: Two times a day (BID) | ORAL | 0 refills | Status: AC

## 2017-08-02 NOTE — Care Management Note (Signed)
Case Management Note  Patient Details  Name: [name] MRN: [id number] Date of Birth: 1935-11-08  Subjective/Objective:  Patient declines HHPT/HHOT-she states she has had HHc in the past so she knows all bout the service-she is agreeable to [place] agency chosen rep Dorian Pod aware of d/c & HHRN order. No further CM needs.                  Action/Plan:d/c home w/HHC.   Expected Discharge Date:  (unknown)               Expected Discharge Plan:  [place]  In-House Referral:     Discharge planning Services  CM Consult  Post Acute Care Choice:  Durable Medical Equipment([employer]-home 02-has travel tank, rollator) Choice offered to:  Patient  DME Arranged:    DME Agency:     HH Arranged:  RN [place] Agency:  [facility]  Status of Service:  Completed, signed off  If discussed at [place] of Stay Meetings, dates discussed:    Additional Comments:  Dessa Phi, RN 08/02/2017, 10:38 AM

## 2017-08-02 NOTE — Discharge Instructions (Signed)
1)1) hold meloxicam while taking prednisone due to increased risk of bleeding and stomach ulcers when these medications are combined 2)Avoid ibuprofen/Advil/Aleve/Motrin/Goody Powders/Naproxen/BC powders/Diclofenac/Indomethacin and other Nonsteroidal anti-inflammatory medications as these will make you more likely to bleed and can cause stomach ulcers, can also cause Kidney problems.  3) use oxygen at 2-3 L via nasal cannula continuously 4) continue breathing treatments including albuterol nebulizer treatments 5) follow-up with your doctor for recheck within 1 week 6) you have flareup of  COPD/bronchitis---- symptoms should improve and hopefully resolve over the next few weeks, cough may persist for longer than a couple of weeks  7) please make sure you pick up your prescriptions today and start taking your medications as prescribed today 08/02/2017

## 2017-08-02 NOTE — Discharge Summary (Signed)
[name], is a 82 y.o. female  DOB July 02, 1935  MRN [id number].  Admission date:  07/29/2017  Admitting Physician  Janece Canterbury, MD  Discharge Date:  08/02/2017   Primary MD  [name], MD  Recommendations for primary care physician for things to follow:   1)1) hold meloxicam while taking prednisone due to increased risk of bleeding and stomach ulcers when these medications are combined 2)Avoid ibuprofen/Advil/Aleve/Motrin/Goody Powders/Naproxen/BC powders/Diclofenac/Indomethacin and other Nonsteroidal anti-inflammatory medications as these will make you more likely to bleed and can cause stomach ulcers, can also cause Kidney problems.  3) use oxygen at 2-3 L via nasal cannula continuously 4) continue breathing treatments including albuterol nebulizer treatments 5) follow-up with your doctor for recheck within 1 week 6) you have flareup of  COPD/bronchitis---- symptoms should improve and hopefully resolve over the next few weeks, cough may persist for longer than a couple of weeks  7) please make sure you pick up your prescriptions today and start taking your medications as prescribed today 08/02/2017   Admission Diagnosis  COPD exacerbation ([place]) [J44.1] Acute respiratory failure with hypoxia (HCC) [J96.01] Acute respiratory failure with hypercapnia ([place]) [J96.02]   Discharge Diagnosis  COPD exacerbation ([place]) [J44.1] Acute respiratory failure with hypoxia (HCC) [J96.01] Acute respiratory failure with hypercapnia (HCC) [J96.02]    Principal Problem:   Acute respiratory failure with hypoxia (HCC) Active Problems:   HLD (hyperlipidemia)   COPD with acute exacerbation (HCC)   Rheumatoid arthritis ([place])   Neuropathy   Acute respiratory failure with hypercapnia ([place])      Past Medical History:  Diagnosis Date  . Arthritis   . Bladder neoplasm   . Chronic cough   . Chronic respiratory  failure ([facility])   . COPD (chronic obstructive pulmonary disease) (HCC)    PULMOLOGIST-  DR [name]--  GOLD III W/  CHRONIC RESPIRATORY FAILURE--  O2 DEPENDENT  . Coronary arteriosclerosis   . Cough productive of purulent sputum   . GERD (gastroesophageal reflux disease)   . Hematuria   . History of nephritis    as child dx w/ Bright's disease (glomerulonephritis)  . Mild obstructive sleep apnea    no cpap recommendation  . Neuropathy, peripheral, idiopathic   . Pelvic pain in female   . PONV (postoperative nausea and vomiting)   . Supplemental oxygen dependent    2L  via Nasal Canula --  activity and nighttime  . Urinary incontinence in female     Past Surgical History:  Procedure Laterality Date  . APPENDECTOMY  1971  . CARDIAC CATHETERIZATION  05-08-2007  dr Marlou Porch   minor coronary plaquing w/ no significant CAD/  20% mLAD,  10% mRCA,  perserved LV, ef 60-65%  . CATARACT EXTRACTION W/ INTRAOCULAR LENS  IMPLANT, BILATERAL  2001 approx  . CHOLECYSTECTOMY  1988  . CYSTOSCOPY W/ RETROGRADES Bilateral 09/24/2014   Procedure: CYSTOSCOPY WITH RETROGRADE PYELOGRAM;  Surgeon: Festus Aloe, MD;  Location: [facility];  Service: Urology;  Laterality: Bilateral;  . CYSTOSCOPY WITH BIOPSY  N/A 09/24/2014   Procedure: CYSTOSCOPY WITH BIOPSY;  Surgeon: Festus Aloe, MD;  Location[facility];  Service: Urology;  Laterality: N/A;  . ESOPHAGOGASTRODUODENOSCOPY  last one 09-09-2008  . FULGURATION OF BLADDER TUMOR N/A 09/24/2014   Procedure: FULGURATION OF BLADDER TUMOR;  Surgeon: Festus Aloe, MD;  Location: [facility];  Service: Urology;  Laterality: N/A;  . [place]  . POSTERIOR LUMBAR FUSION  10-07-2007   bilateral laminectomy L4 and bilateral L4-5 diskectomy w/ fusion  . TONSILLECTOMY  as child  . TOTAL ABDOMINAL HYSTERECTOMY W/ BILATERAL SALPINGOOPHORECTOMY  1973  . TRANSTHORACIC ECHOCARDIOGRAM  07-07-2011   normal echo,  ef  65-70%       HPI  from the history and physical done on the day of admission:    Chief Complaint: SOB  HPI: [name] is a 82 y.o. female with history of COPD who sometimes wears home oxygen of 2 L, CAD, GERD, obstructive sleep apnea but not on CPAP who was admitted on 4/6 and discharged on 4/7 for acute COPD exacerbation.  Although she felt better than when she had initially presented to the emergency department she still felt very short of breath and had significant dyspnea with ambulating to and from the bathroom at the time that she went home.  She went home, she started her prescription medications and continued her home duo nebs twice daily, Spiriva, and Symbicort.  She became increasingly short of breath overnight and used her albuterol inhaler every few hours without relief.  This morning she called up her sister and told her she did not think she was going to make it.  She was brought back to the emergency department for reevaluation.  She denies any fevers or chills at home.  She has had a substernal chest pressure with radiation to the right shoulder which was present since she came to the emergency department the first time.  The pain is moderate and comes and goes.  It is associated with slight worsening shortness of breath.  She is also had some mild nausea.  This morning she felt lightheaded.  ED Course: Vital signs tachycardic and tachypnea to the 30s with moderate to severe respiratory distress.  She was immediately placed on BiPAP.  Labs: ABG on BiPAP 7.36/50 7/133.  White blood cell count 21.3, BNP 237, troponin negative.  EKG demonstrated sinus tachycardia without acute ischemia.  Chest x-ray again showed no acute infiltrate, no pleural effusions or evidence of interstitial edema    Hospital Course:    Brief History/Interval Summary:82 year old Caucasian female with a past medical history of COPD on home oxygen as needed, coronary artery disease, GERD, obstructive sleep  apnea but not on CPAP was recently discharged after being managed for acute COPD exacerbation. She initially did feel better but then her shortness of breath worsened at home. She had to come back to the hospital and was hospitalized. She was placed on BiPAP. Pulmonology was consulted.    Plan:- 1) Gold stage III COPD with acute exacerbation:Overall  Much improved,  resolvingrespiratory symptoms,recent Flu panel is negative,chest x-ray w/o definite pneumonia, pulmonology consult appreciated,  Discharge on prednisone,  continue bronchodilators and mucolytics, continue supplemental oxygen,  No longer requiring Bipap ,  Treated with Rocephin and azithromycin, Discharge on Omnicef and azithromycin  2)Acute on chronic respiratory failure with hypoxia-secondary to #1 above, patient is now off BiPAP, treated as above #1, prior to admission she was on 2-3 L of oxygen via  nasal cannula  3)History of coronary artery disease- Stable. No ACS sxs, Continue aspirin and statin. Not on beta-blocker due to her acute respiratory illness.  4)History of peripheral neuropathy-  Stable. Continue home medications. Noted to be on gabapentin.  5)History of rheumatoid arthritis Stable. Continue Plaquenil and methotrexate , hold Meloxicam while on high-dose steroids  6)History of GERD Continue PPI.  7)Social-patient apparently lives with her son-in-law and 74 year old granddaughter, she is worried that she does not really have much help at home, discussed with case manager, plan is to discharge patient home with home health services  Code Status:Full code Family Communication:Discussed with the patient Disposition Plan: Home with [facility]   STUDIES: 4/9 Echo >>  CULTURES: Sputum 4/8 >>  ANTIBIOTICS: Azithromycin 4/8 >>  SIGNIFICANT EVENTS: 4/6-4/7 Admit with [facility] 4/08 Readmitted with  [facility]  Consultants:Pulmonology  Procedures:None  Antibiotics: Azithromycin/rocephin Discharge on Omnicef and azithromycin  Discharge Condition: stable  Follow UP  [place] Follow up.   Specialty:  Home Health Services Why:  [facility] nursing Contact information: [place] [address] [phone]           Diet and Activity recommendation:  As advised  Discharge Instructions    Discharge Instructions    Diet - low sodium heart healthy   Complete by:  As directed    Discharge instructions   Complete by:  As directed    1)1) hold meloxicam while taking prednisone due to increased risk of bleeding and stomach ulcers when these medications are combined 2)Avoid ibuprofen/Advil/Aleve/Motrin/Goody Powders/Naproxen/BC powders/Diclofenac/Indomethacin and other Nonsteroidal anti-inflammatory medications as these will make you more likely to bleed and can cause stomach ulcers, can also cause Kidney problems.  3) use oxygen at 2-3 L via nasal cannula continuously 4) continue breathing treatments including albuterol nebulizer treatments 5) follow-up with your doctor for recheck within 1 week 6) you have flareup of  COPD/bronchitis---- symptoms should improve and hopefully resolve over the next few weeks, cough may persist for longer than a couple of weeks  7) please make sure you pick up your prescriptions today and start taking your medications as prescribed today 08/02/2017   Increase activity slowly   Complete by:  As directed         Discharge Medications     Allergies as of 08/02/2017      Reactions   Kapidex [dexlansoprazole] Other (See Comments)   Stomach pain, diarrhea   Zegerid [omeprazole] Other (See Comments)   Burning in chest      Medication List    STOP taking these medications   meloxicam 15 MG tablet Commonly known as:  MOBIC     TAKE these medications   acetaminophen 325  MG tablet Commonly known as:  TYLENOL Take 2 tablets (650 mg total) by mouth every 6 (six) hours as needed for mild pain (or Fever >/= 101).   albuterol 108 (90 Base) MCG/ACT inhaler Commonly known as:  PROVENTIL HFA;VENTOLIN HFA Inhale 2 puffs into the lungs every 6 (six) hours as needed. For shortness of breath. What changed:  Another medication with the same name was added. Make sure you understand how and when to take each.   albuterol (2.5 MG/3ML) 0.083% nebulizer solution Commonly known as:  PROVENTIL Take 3 mLs (2.5 mg total) by nebulization every 4 (four) hours as needed for wheezing or shortness of breath. For shortness of breath. What changed:  Another medication with the  same name was added. Make sure you understand how and when to take each.   albuterol (2.5 MG/3ML) 0.083% nebulizer solution Commonly known as:  PROVENTIL Take 3 mLs (2.5 mg total) by nebulization 3 (three) times daily. What changed:  You were already taking a medication with the same name, and this prescription was added. Make sure you understand how and when to take each.   aspirin EC 81 MG tablet Take 81 mg by mouth daily.   atorvastatin 40 MG tablet Commonly known as:  LIPITOR Take 40 mg by mouth every morning.   azelastine 0.1 % nasal spray Commonly known as:  ASTELIN Place 1 spray into the nose 2 (two) times daily.   azithromycin 250 MG tablet Commonly known as:  ZITHROMAX Take 1 tablet (250 mg total) by mouth daily for 3 days. What changed:    how much to take  how to take this  when to take this  additional instructions   bisacodyl 5 MG EC tablet Commonly known as:  DULCOLAX Take 1 tablet (5 mg total) by mouth daily as needed for moderate constipation.   budesonide-formoterol 160-4.5 MCG/ACT inhaler Commonly known as:  SYMBICORT Inhale 2 puffs into the lungs 2 (two) times daily.   CALCIUM + D PO Take 2 tablets by mouth daily.   carboxymethylcellulose 0.5 % Soln Commonly known  as:  REFRESH PLUS 2 drops 2 (two) times daily as needed (dry eye).   cefdinir 300 MG capsule Commonly known as:  OMNICEF Take 1 capsule (300 mg total) by mouth 2 (two) times daily for 7 days.   cetirizine 10 MG tablet Commonly known as:  ZYRTEC Take 1 tablet (10 mg total) by mouth every morning.   cyanocobalamin 1000 MCG tablet Take 1 tablet (1,000 mcg total) by mouth daily.   esomeprazole 40 MG capsule Commonly known as:  NEXIUM Take 1 capsule (40 mg total) by mouth every morning.   feeding supplement (ENSURE ENLIVE) Liqd Take 237 mLs by mouth 2 (two) times daily between meals.   folic acid 1 MG tablet Commonly known as:  FOLVITE Take 1 tablet (1 mg total) by mouth daily.   gabapentin 100 MG capsule Commonly known as:  NEURONTIN Take 2-3 capsules (200-300 mg total) by mouth 3 (three) times daily. 200 mg every AM and at noon and 300 mg QHS   guaiFENesin 600 MG 12 hr tablet Commonly known as:  MUCINEX Take 1 tablet (600 mg total) by mouth 2 (two) times daily for 10 days.   hydroxychloroquine 200 MG tablet Commonly known as:  PLAQUENIL Take 200 mg by mouth daily.   ipratropium-albuterol 0.5-2.5 (3) MG/3ML Soln Commonly known as:  DUONEB Take 3 mLs by nebulization 2 (two) times daily.   methotrexate 2.5 MG tablet Commonly known as:  RHEUMATREX Take 15 mg by mouth once a week.   mupirocin ointment 2 % Commonly known as:  BACTROBAN Place 1 application into the nose 2 (two) times daily.   ondansetron 4 MG tablet Commonly known as:  ZOFRAN Take 1 tablet (4 mg total) by mouth every 6 (six) hours as needed for nausea.   polyethylene glycol packet Commonly known as:  MIRALAX / GLYCOLAX Take 17 g by mouth daily. Start taking on:  08/03/2017   polyvinyl alcohol 1.4 % ophthalmic solution Commonly known as:  LIQUIFILM TEARS Place 2 drops into both eyes as needed for dry eyes.   predniSONE 20 MG tablet Commonly known as:  DELTASONE Take 2 tablets (40 mg  total) by mouth  daily with breakfast.   sertraline 100 MG tablet Commonly known as:  ZOLOFT Take 100 mg by mouth daily.   tiotropium 18 MCG inhalation capsule Commonly known as:  SPIRIVA Place 1 capsule (18 mcg total) into inhaler and inhale every morning.   vitamin A 10000 UNIT capsule Take 10,000 Units by mouth daily.   Vitamin D 2000 units tablet Take 2,000 Units by mouth daily.   vitamin E 400 UNIT capsule Take 400 Units by mouth daily.       Major procedures and Radiology Reports - PLEASE review detailed and final reports for all details, in brief -   Dg Chest 2 View  Result Date: 07/31/2017 CLINICAL DATA:  Shortness of Breath EXAM: CHEST - 2 VIEW COMPARISON:  July 30, 2017 FINDINGS: There is no edema or consolidation. The heart size and pulmonary vascularity are normal. No adenopathy. There is degenerative change in the thoracic spine. IMPRESSION: No edema or consolidation. Electronically Signed   By: Lowella Grip III M.D.   On: 07/31/2017 13:23   Dg Chest 2 View  Result Date: 07/27/2017 CLINICAL DATA:  Nausea and vomiting.  Productive cough.  Chest pain. EXAM: CHEST - 2 VIEW COMPARISON:  05/13/2017 FINDINGS: Artifact overlies the chest. Heart size is normal. Chronic aortic atherosclerosis. No evidence of infiltrate, effusion or collapse. Ordinary degenerative changes affect the spine. There may be central bronchial thickening. IMPRESSION: Possible bronchitis pattern.  No consolidation or collapse. Electronically Signed   By: Nelson Chimes M.D.   On: 07/27/2017 15:01   Dg Chest Port 1 View  Result Date: 07/30/2017 CLINICAL DATA:  Respiratory failure. EXAM: PORTABLE CHEST 1 VIEW COMPARISON:  07/29/2017. FINDINGS: Mediastinum hilar structures are normal. Right costophrenic angles incompletely imaged. Low lung volumes with mild bibasilar subsegmental atelectasis and/or scarring again noted. Mild biapical pleural-parenchymal thickening again noted consistent scarring. Right costophrenic angle  not imaged. Heart size stable. No pulmonary venous congestion. No acute bony abnormality. IMPRESSION: Mild bibasilar subsegmental atelectasis and/or scarring. Mild biapical pleuroparenchymal thickening again noted consistent with scarring. No acute abnormality identified. Electronically Signed   By: Marcello Moores  Register   On: 07/30/2017 07:16   Dg Chest Portable 1 View  Result Date: 07/29/2017 CLINICAL DATA:  Shortness of breath with decreased oxygen saturation EXAM: PORTABLE CHEST 1 VIEW COMPARISON:  July 27, 2017 FINDINGS: There is mild scarring in the apices and left base regions. There is no edema or consolidation. The heart size and pulmonary vascularity are normal. There is aortic atherosclerosis. No adenopathy. No bone lesions. IMPRESSION: Scattered areas of scarring bilaterally. No edema or consolidation. There is aortic atherosclerosis. Aortic Atherosclerosis (ICD10-I70.0). Electronically Signed   By: Lowella Grip III M.D.   On: 07/29/2017 12:05    Micro Results   Recent Results (from the past 240 hour(s))  Culture, blood (routine x 2)     Status: None   Collection Time: 07/27/17  3:02 PM  Result Value Ref Range Status   Specimen Description   Final    BLOOD BLOOD RIGHT HAND Performed at [facility] [address] [place], [place] [address]    Special Requests   Final    BOTTLES DRAWN AEROBIC AND ANAEROBIC Blood Culture adequate volume Performed at [facility] [address] [place], [place] [address]    Culture   Final    NO GROWTH 5 DAYS Performed at [facility], [place] [address], [place], [place] [address]    Report  Status 08/01/2017 FINAL  Final  Culture, blood (routine x 2)     Status: None   Collection Time: 07/27/17  3:02 PM  Result Value Ref Range Status   Specimen Description   Final    BLOOD RIGHT ANTECUBITAL Performed at [facility] [address], [place], [place] [address]    Special Requests    Final    BOTTLES DRAWN AEROBIC AND ANAEROBIC Blood Culture adequate volume Performed at [facility] [address] [place], [place] [address]    Culture   Final    NO GROWTH 5 DAYS Performed at [facility], [place] [address] [place], [place] [address]    Report Status 08/01/2017 FINAL  Final  Culture, respiratory (NON-Expectorated)     Status: None   Collection Time: 07/28/17  7:30 AM  Result Value Ref Range Status   Specimen Description   Final    SPUTUM Performed at [facility] [address], [place], [place] [address]    Special Requests   Final    NONE Performed at [facility], [place] [address] [place], [place] [address]    Gram Stain   Final    FEW WBC PRESENT, PREDOMINANTLY PMN RARE SQUAMOUS EPITHELIAL CELLS PRESENT MODERATE GRAM POSITIVE COCCI IN PAIRS RARE GRAM POSITIVE RODS FEW GRAM NEGATIVE RODS    Culture   Final    ABUNDANT Consistent with normal respiratory flora. Performed at [facility], [place] [address], [place], [place] [address]    Report Status 07/31/2017 FINAL  Final  MRSA PCR Screening     Status: Abnormal   Collection Time: 07/29/17  5:48 PM  Result Value Ref Range Status   MRSA by PCR POSITIVE (A) NEGATIVE Final    Comment:        The GeneXpert MRSA Assay (FDA approved for NASAL specimens only), is one component of a comprehensive MRSA colonization surveillance program. It is not intended to diagnose MRSA infection nor to guide or monitor treatment for MRSA infections. RESULT CALLED TO, READ BACK BY AND VERIFIED WITH: Vanita Ingles @2018  07/29/17 MKELLY Performed at [facility], [place] [address] [place], [place] [address]   Culture, expectorated sputum-assessment     Status: None   Collection Time: 07/30/17 12:01 PM  Result Value Ref Range Status   Specimen Description EXPECTORATED SPUTUM  Final   Special Requests Normal  Final   Sputum evaluation    Final    THIS SPECIMEN IS ACCEPTABLE FOR SPUTUM CULTURE Performed at [facility], [place] [address] [place] [address] [place], [place] [address]    Report Status 07/30/2017 FINAL  Final  Culture, respiratory (NON-Expectorated)     Status: None   Collection Time: 07/30/17 12:01 PM  Result Value Ref Range Status   Specimen Description   Final    EXPECTORATED SPUTUM Performed at [facility] [address], [place], [place] [address]    Special Requests   Final    Normal Reflexed from [phone] Performed at [facility] [address], [place], [place] [address]    Gram Stain   Final    FEW WBC PRESENT,BOTH PMN AND MONONUCLEAR FEW GRAM NEGATIVE RODS FEW GRAM POSITIVE COCCI FEW GRAM VARIABLE ROD ABUNDANT SQUAMOUS EPITHELIAL CELLS PRESENT    Culture   Final    Consistent with normal respiratory flora. Performed at [facility], [place] [address], [place], [place] [address]  Report Status 08/02/2017 FINAL  Final       Today   Subjective    Alla German today has no  New complaints,  Breathing much better, no fevers          Patient has been seen and examined prior to discharge   Objective   Blood pressure 130/61, pulse (!) 101, temperature 98.4 F (36.9 C), temperature source Oral, resp. rate (!) 24, height 5' (1.524 m), weight 78.7 kg (173 lb 8 oz), SpO2 (!) 84 %.   Intake/Output Summary (Last 24 hours) at 08/02/2017 1541 Last data filed at 08/02/2017 0600 Gross per 24 hour  Intake 340 ml  Output 950 ml  Net -610 ml    Exam Gen:- Awake Alert, no acute distress HEENT:- [place].AT, No sclera icterus Nose- 3 L/min Neck-Supple Neck,No JVD,.  Lungs- improved air movement, no significant wheezing or rhonchi,  CV- S1, S2 normal Abd-  +ve B.Sounds, Abd Soft, No tenderness,    Extremity/Skin:- No  edema, good pulses Psych-affect is appropriate, oriented x3 Neuro-no new focal deficits, no tremors     Data Review   CBC w  Diff:  Lab Results  Component Value Date   WBC 13.6 (H) 08/02/2017   HGB 12.5 08/02/2017   HCT 40.3 08/02/2017   PLT 401 (H) 08/02/2017   LYMPHOPCT 22 05/17/2017   MONOPCT 9 05/17/2017   EOSPCT 3 05/17/2017   BASOPCT 0 05/17/2017    CMP:  Lab Results  Component Value Date   NA 141 08/02/2017   K 3.2 (L) 08/02/2017   CL 92 (L) 08/02/2017   CO2 37 (H) 08/02/2017   BUN 18 08/02/2017   CREATININE 0.68 08/02/2017   CREATININE 0.74 08/17/2013   PROT 6.1 (L) 05/14/2017   ALBUMIN 3.1 (L) 05/14/2017   BILITOT 0.6 05/14/2017   ALKPHOS 82 05/14/2017   AST 15 05/14/2017   ALT 19 05/14/2017  .   Total Discharge time is about 33 minutes  Roxan Hockey M.D on 08/02/2017 at 3:41 PM  [facility]   Office  [phone]  Voice Recognition Viviann Spare dictation system was used to create this note, attempts have been made to correct errors. Please contact the author with questions and/or clarifications.

## 2017-08-02 NOTE — Progress Notes (Signed)
Ambulated patient approx 113ft. "Did better yesterday". Unsteady for most of walk with walker.  Dyspneic with activity. Sat ambulating on 2 l/min dropped to 87%. Increased to 3 l/min. Eulas Post, RN

## 2017-08-02 NOTE — Progress Notes (Signed)
Occupational Therapy Treatment Patient Details Name: [name] MRN: [id number] DOB: [birth year] Today's Date: 08/02/2017    History of present illness 82 y/o female with COPD admitted on 4/8 with acute respiratory failure with hypoxemia due to a COPD exacerbation. Oxygen dependent., HA RA   OT comments  Pt with decreased endurance; wanted to brush teeth from sitting. She is interested in getting sock aide; resources given   Follow Up Recommendations  Supervision/Assistance - 24 hour(refusing follow up therapy)    Equipment Recommendations  None recommended by OT    Recommendations for Other Services      Precautions / Restrictions Precautions Precautions: Fall Precaution Comments: monitor O2 sats, incontinence Restrictions Weight Bearing Restrictions: No       Mobility Bed Mobility               General bed mobility comments: pt at EOB  Transfers   Equipment used: None     Stand pivot transfers: Min guard       General transfer comment: for safety    Balance                                           ADL either performed or assessed with clinical judgement   ADL                       Lower Body Dressing: Min guard;Sit to/from stand;With adaptive equipment                 General ADL Comments: practiced with sock aide and reacher. She has a Secondary school teacher at home and has used it for adls. She is interested in sock aide.  Gave her info for this.  Pt fatiqued and did not want to stand.  She does not want follow up therapy.  Reinforced energy conservation.  Up to chair only today     Vision       Perception     Praxis      Cognition Arousal/Alertness: Awake/alert Behavior During Therapy: WFL for tasks assessed/performed Overall Cognitive Status: Within Functional Limits for tasks assessed                                          Exercises     Shoulder Instructions       General Comments       Pertinent Vitals/ Pain       Faces Pain Scale: Hurts little more Pain Location: back Pain Descriptors / Indicators: Aching Pain Intervention(s): Limited activity within patient's tolerance;Monitored during session;Repositioned  Home Living                                          Prior Functioning/Environment              Frequency  Min 2X/week        Progress Toward Goals  OT Goals(current goals can now be found in the care plan section)  Progress towards OT goals: Progressing toward goals     Plan      Co-evaluation                 AM-PAC  PT "6 Clicks" Daily Activity     Outcome Measure   Help from another person eating meals?: None Help from another person taking care of personal grooming?: A Little Help from another person toileting, which includes using toliet, bedpan, or urinal?: A Little Help from another person bathing (including washing, rinsing, drying)?: A Little Help from another person to put on and taking off regular upper body clothing?: A Little Help from another person to put on and taking off regular lower body clothing?: A Little 6 Click Score: 19    End of Session        Activity Tolerance Patient limited by fatigue   Patient Left in chair;with call bell/phone within reach;with nursing/sitter in room   Nurse Communication          Time: 3267-1245 OT Time Calculation (min): 22 min  Charges: OT General Charges $OT Visit: 1 Visit OT Treatments $Self Care/Home Management : 8-22 mins  Lesle Chris, OTR/L [phone] 08/02/2017   [name] 08/02/2017, 11:13 AM

## 2017-08-12 DIAGNOSIS — F3340 Major depressive disorder, recurrent, in remission, unspecified: Secondary | ICD-10-CM | POA: Diagnosis not present

## 2017-08-12 DIAGNOSIS — K219 Gastro-esophageal reflux disease without esophagitis: Secondary | ICD-10-CM | POA: Diagnosis not present

## 2017-08-12 DIAGNOSIS — M0579 Rheumatoid arthritis with rheumatoid factor of multiple sites without organ or systems involvement: Secondary | ICD-10-CM | POA: Diagnosis not present

## 2017-08-12 DIAGNOSIS — J449 Chronic obstructive pulmonary disease, unspecified: Secondary | ICD-10-CM | POA: Diagnosis not present

## 2017-08-12 DIAGNOSIS — J9621 Acute and chronic respiratory failure with hypoxia: Secondary | ICD-10-CM | POA: Diagnosis not present

## 2017-09-06 DIAGNOSIS — M25519 Pain in unspecified shoulder: Secondary | ICD-10-CM | POA: Diagnosis not present

## 2017-09-06 DIAGNOSIS — M797 Fibromyalgia: Secondary | ICD-10-CM | POA: Diagnosis not present

## 2017-09-06 DIAGNOSIS — M25431 Effusion, right wrist: Secondary | ICD-10-CM | POA: Diagnosis not present

## 2017-09-06 DIAGNOSIS — M0579 Rheumatoid arthritis with rheumatoid factor of multiple sites without organ or systems involvement: Secondary | ICD-10-CM | POA: Diagnosis not present

## 2017-09-06 DIAGNOSIS — M150 Primary generalized (osteo)arthritis: Secondary | ICD-10-CM | POA: Diagnosis not present

## 2017-09-06 DIAGNOSIS — M5412 Radiculopathy, cervical region: Secondary | ICD-10-CM | POA: Diagnosis not present

## 2017-09-06 DIAGNOSIS — M154 Erosive (osteo)arthritis: Secondary | ICD-10-CM | POA: Diagnosis not present

## 2017-09-06 DIAGNOSIS — Z79899 Other long term (current) drug therapy: Secondary | ICD-10-CM | POA: Diagnosis not present

## 2017-09-06 DIAGNOSIS — M545 Low back pain: Secondary | ICD-10-CM | POA: Diagnosis not present

## 2017-12-16 DIAGNOSIS — E785 Hyperlipidemia, unspecified: Secondary | ICD-10-CM | POA: Diagnosis not present

## 2017-12-16 DIAGNOSIS — M85852 Other specified disorders of bone density and structure, left thigh: Secondary | ICD-10-CM | POA: Diagnosis not present

## 2017-12-16 DIAGNOSIS — J449 Chronic obstructive pulmonary disease, unspecified: Secondary | ICD-10-CM | POA: Diagnosis not present

## 2017-12-16 DIAGNOSIS — Z23 Encounter for immunization: Secondary | ICD-10-CM | POA: Diagnosis not present

## 2017-12-16 DIAGNOSIS — M48061 Spinal stenosis, lumbar region without neurogenic claudication: Secondary | ICD-10-CM | POA: Diagnosis not present

## 2017-12-16 DIAGNOSIS — F3342 Major depressive disorder, recurrent, in full remission: Secondary | ICD-10-CM | POA: Diagnosis not present

## 2017-12-16 DIAGNOSIS — K219 Gastro-esophageal reflux disease without esophagitis: Secondary | ICD-10-CM | POA: Diagnosis not present

## 2017-12-16 DIAGNOSIS — E559 Vitamin D deficiency, unspecified: Secondary | ICD-10-CM | POA: Diagnosis not present

## 2017-12-16 DIAGNOSIS — Z79899 Other long term (current) drug therapy: Secondary | ICD-10-CM | POA: Diagnosis not present

## 2017-12-16 DIAGNOSIS — M0579 Rheumatoid arthritis with rheumatoid factor of multiple sites without organ or systems involvement: Secondary | ICD-10-CM | POA: Diagnosis not present

## 2017-12-16 DIAGNOSIS — Z791 Long term (current) use of non-steroidal anti-inflammatories (NSAID): Secondary | ICD-10-CM | POA: Diagnosis not present

## 2017-12-16 DIAGNOSIS — R7303 Prediabetes: Secondary | ICD-10-CM | POA: Diagnosis not present

## 2018-01-27 DIAGNOSIS — Z961 Presence of intraocular lens: Secondary | ICD-10-CM | POA: Diagnosis not present

## 2018-01-27 DIAGNOSIS — H0288A Meibomian gland dysfunction right eye, upper and lower eyelids: Secondary | ICD-10-CM | POA: Diagnosis not present

## 2018-01-27 DIAGNOSIS — M545 Low back pain: Secondary | ICD-10-CM | POA: Diagnosis not present

## 2018-01-27 DIAGNOSIS — H52203 Unspecified astigmatism, bilateral: Secondary | ICD-10-CM | POA: Diagnosis not present

## 2018-01-27 DIAGNOSIS — M25519 Pain in unspecified shoulder: Secondary | ICD-10-CM | POA: Diagnosis not present

## 2018-01-27 DIAGNOSIS — H26493 Other secondary cataract, bilateral: Secondary | ICD-10-CM | POA: Diagnosis not present

## 2018-01-27 DIAGNOSIS — H01001 Unspecified blepharitis right upper eyelid: Secondary | ICD-10-CM | POA: Diagnosis not present

## 2018-01-27 DIAGNOSIS — M797 Fibromyalgia: Secondary | ICD-10-CM | POA: Diagnosis not present

## 2018-01-27 DIAGNOSIS — H524 Presbyopia: Secondary | ICD-10-CM | POA: Diagnosis not present

## 2018-01-27 DIAGNOSIS — M154 Erosive (osteo)arthritis: Secondary | ICD-10-CM | POA: Diagnosis not present

## 2018-01-27 DIAGNOSIS — H01004 Unspecified blepharitis left upper eyelid: Secondary | ICD-10-CM | POA: Diagnosis not present

## 2018-01-27 DIAGNOSIS — Z79899 Other long term (current) drug therapy: Secondary | ICD-10-CM | POA: Diagnosis not present

## 2018-01-27 DIAGNOSIS — H0288B Meibomian gland dysfunction left eye, upper and lower eyelids: Secondary | ICD-10-CM | POA: Diagnosis not present

## 2018-01-27 DIAGNOSIS — M0579 Rheumatoid arthritis with rheumatoid factor of multiple sites without organ or systems involvement: Secondary | ICD-10-CM | POA: Diagnosis not present

## 2018-01-27 DIAGNOSIS — M25431 Effusion, right wrist: Secondary | ICD-10-CM | POA: Diagnosis not present

## 2018-01-27 DIAGNOSIS — H04123 Dry eye syndrome of bilateral lacrimal glands: Secondary | ICD-10-CM | POA: Diagnosis not present

## 2018-01-27 DIAGNOSIS — M5412 Radiculopathy, cervical region: Secondary | ICD-10-CM | POA: Diagnosis not present

## 2018-01-27 DIAGNOSIS — M150 Primary generalized (osteo)arthritis: Secondary | ICD-10-CM | POA: Diagnosis not present

## 2018-02-17 DIAGNOSIS — H26492 Other secondary cataract, left eye: Secondary | ICD-10-CM | POA: Diagnosis not present

## 2018-02-26 DIAGNOSIS — Z23 Encounter for immunization: Secondary | ICD-10-CM | POA: Diagnosis not present

## 2018-03-24 DIAGNOSIS — J441 Chronic obstructive pulmonary disease with (acute) exacerbation: Secondary | ICD-10-CM | POA: Diagnosis not present

## 2018-03-24 DIAGNOSIS — Z0000 Encounter for general adult medical examination without abnormal findings: Secondary | ICD-10-CM | POA: Diagnosis not present

## 2018-03-24 DIAGNOSIS — M8580 Other specified disorders of bone density and structure, unspecified site: Secondary | ICD-10-CM | POA: Diagnosis not present

## 2018-03-24 DIAGNOSIS — Z01419 Encounter for gynecological examination (general) (routine) without abnormal findings: Secondary | ICD-10-CM | POA: Diagnosis not present

## 2018-03-26 ENCOUNTER — Other Ambulatory Visit: Payer: Self-pay | Admitting: Family Medicine

## 2018-03-26 DIAGNOSIS — M8580 Other specified disorders of bone density and structure, unspecified site: Secondary | ICD-10-CM

## 2018-05-05 DIAGNOSIS — R350 Frequency of micturition: Secondary | ICD-10-CM | POA: Diagnosis not present

## 2018-05-13 ENCOUNTER — Other Ambulatory Visit: Payer: Self-pay | Admitting: Family Medicine

## 2018-05-13 DIAGNOSIS — Z1231 Encounter for screening mammogram for malignant neoplasm of breast: Secondary | ICD-10-CM

## 2018-05-26 ENCOUNTER — Ambulatory Visit
Admission: RE | Admit: 2018-05-26 | Discharge: 2018-05-26 | Disposition: A | Discharge status: Home / Self Care | Payer: Medicare Other | Source: Ambulatory Visit | Attending: Family Medicine | Admitting: Family Medicine

## 2018-05-26 DIAGNOSIS — M545 Low back pain: Secondary | ICD-10-CM | POA: Diagnosis not present

## 2018-05-26 DIAGNOSIS — M5412 Radiculopathy, cervical region: Secondary | ICD-10-CM | POA: Diagnosis not present

## 2018-05-26 DIAGNOSIS — M150 Primary generalized (osteo)arthritis: Secondary | ICD-10-CM | POA: Diagnosis not present

## 2018-05-26 DIAGNOSIS — M25519 Pain in unspecified shoulder: Secondary | ICD-10-CM | POA: Diagnosis not present

## 2018-05-26 DIAGNOSIS — M8589 Other specified disorders of bone density and structure, multiple sites: Secondary | ICD-10-CM | POA: Diagnosis not present

## 2018-05-26 DIAGNOSIS — M154 Erosive (osteo)arthritis: Secondary | ICD-10-CM | POA: Diagnosis not present

## 2018-05-26 DIAGNOSIS — Z780 Asymptomatic menopausal state: Secondary | ICD-10-CM | POA: Diagnosis not present

## 2018-05-26 DIAGNOSIS — Z79899 Other long term (current) drug therapy: Secondary | ICD-10-CM | POA: Diagnosis not present

## 2018-05-26 DIAGNOSIS — M79642 Pain in left hand: Secondary | ICD-10-CM | POA: Diagnosis not present

## 2018-05-26 DIAGNOSIS — M797 Fibromyalgia: Secondary | ICD-10-CM | POA: Diagnosis not present

## 2018-05-26 DIAGNOSIS — M8580 Other specified disorders of bone density and structure, unspecified site: Secondary | ICD-10-CM

## 2018-05-26 DIAGNOSIS — M0579 Rheumatoid arthritis with rheumatoid factor of multiple sites without organ or systems involvement: Secondary | ICD-10-CM | POA: Diagnosis not present

## 2018-05-26 DIAGNOSIS — J449 Chronic obstructive pulmonary disease, unspecified: Secondary | ICD-10-CM | POA: Diagnosis not present

## 2018-05-27 DIAGNOSIS — M19041 Primary osteoarthritis, right hand: Secondary | ICD-10-CM | POA: Diagnosis not present

## 2018-05-27 DIAGNOSIS — M19042 Primary osteoarthritis, left hand: Secondary | ICD-10-CM | POA: Diagnosis not present

## 2018-06-08 ENCOUNTER — Other Ambulatory Visit: Payer: Self-pay

## 2018-06-08 ENCOUNTER — Emergency Department (HOSPITAL_COMMUNITY): Payer: Medicare Other

## 2018-06-08 ENCOUNTER — Inpatient Hospital Stay (HOSPITAL_COMMUNITY)
Admission: EM | Admit: 2018-06-08 | Discharge: 2018-06-10 | DRG: 193 | Disposition: A | Discharge status: Home / Self Care | Payer: Medicare Other | Attending: Internal Medicine | Admitting: Internal Medicine

## 2018-06-08 ENCOUNTER — Encounter (HOSPITAL_COMMUNITY): Payer: Self-pay | Admitting: Emergency Medicine

## 2018-06-08 DIAGNOSIS — E785 Hyperlipidemia, unspecified: Secondary | ICD-10-CM | POA: Diagnosis present

## 2018-06-08 DIAGNOSIS — Z90722 Acquired absence of ovaries, bilateral: Secondary | ICD-10-CM

## 2018-06-08 DIAGNOSIS — Z9049 Acquired absence of other specified parts of digestive tract: Secondary | ICD-10-CM | POA: Diagnosis not present

## 2018-06-08 DIAGNOSIS — Z87891 Personal history of nicotine dependence: Secondary | ICD-10-CM

## 2018-06-08 DIAGNOSIS — Z79899 Other long term (current) drug therapy: Secondary | ICD-10-CM

## 2018-06-08 DIAGNOSIS — J440 Chronic obstructive pulmonary disease with acute lower respiratory infection: Secondary | ICD-10-CM | POA: Diagnosis present

## 2018-06-08 DIAGNOSIS — R112 Nausea with vomiting, unspecified: Secondary | ICD-10-CM

## 2018-06-08 DIAGNOSIS — Z981 Arthrodesis status: Secondary | ICD-10-CM

## 2018-06-08 DIAGNOSIS — Z9079 Acquired absence of other genital organ(s): Secondary | ICD-10-CM | POA: Diagnosis not present

## 2018-06-08 DIAGNOSIS — R05 Cough: Secondary | ICD-10-CM | POA: Diagnosis not present

## 2018-06-08 DIAGNOSIS — I10 Essential (primary) hypertension: Secondary | ICD-10-CM | POA: Diagnosis present

## 2018-06-08 DIAGNOSIS — Z7951 Long term (current) use of inhaled steroids: Secondary | ICD-10-CM

## 2018-06-08 DIAGNOSIS — G629 Polyneuropathy, unspecified: Secondary | ICD-10-CM | POA: Diagnosis present

## 2018-06-08 DIAGNOSIS — R0602 Shortness of breath: Secondary | ICD-10-CM | POA: Diagnosis not present

## 2018-06-08 DIAGNOSIS — Z8614 Personal history of Methicillin resistant Staphylococcus aureus infection: Secondary | ICD-10-CM

## 2018-06-08 DIAGNOSIS — J9621 Acute and chronic respiratory failure with hypoxia: Secondary | ICD-10-CM | POA: Diagnosis present

## 2018-06-08 DIAGNOSIS — J441 Chronic obstructive pulmonary disease with (acute) exacerbation: Secondary | ICD-10-CM

## 2018-06-08 DIAGNOSIS — A419 Sepsis, unspecified organism: Secondary | ICD-10-CM

## 2018-06-08 DIAGNOSIS — K219 Gastro-esophageal reflux disease without esophagitis: Secondary | ICD-10-CM | POA: Diagnosis present

## 2018-06-08 DIAGNOSIS — J189 Pneumonia, unspecified organism: Principal | ICD-10-CM

## 2018-06-08 DIAGNOSIS — Z9981 Dependence on supplemental oxygen: Secondary | ICD-10-CM | POA: Diagnosis not present

## 2018-06-08 DIAGNOSIS — G608 Other hereditary and idiopathic neuropathies: Secondary | ICD-10-CM | POA: Diagnosis present

## 2018-06-08 DIAGNOSIS — J9611 Chronic respiratory failure with hypoxia: Secondary | ICD-10-CM | POA: Diagnosis present

## 2018-06-08 DIAGNOSIS — Z825 Family history of asthma and other chronic lower respiratory diseases: Secondary | ICD-10-CM

## 2018-06-08 DIAGNOSIS — Z90710 Acquired absence of both cervix and uterus: Secondary | ICD-10-CM

## 2018-06-08 DIAGNOSIS — R000 Tachycardia, unspecified: Secondary | ICD-10-CM | POA: Diagnosis not present

## 2018-06-08 DIAGNOSIS — Z888 Allergy status to other drugs, medicaments and biological substances status: Secondary | ICD-10-CM | POA: Diagnosis not present

## 2018-06-08 DIAGNOSIS — M069 Rheumatoid arthritis, unspecified: Secondary | ICD-10-CM | POA: Diagnosis present

## 2018-06-08 DIAGNOSIS — G4733 Obstructive sleep apnea (adult) (pediatric): Secondary | ICD-10-CM | POA: Diagnosis present

## 2018-06-08 DIAGNOSIS — B349 Viral infection, unspecified: Secondary | ICD-10-CM | POA: Diagnosis present

## 2018-06-08 DIAGNOSIS — Z7952 Long term (current) use of systemic steroids: Secondary | ICD-10-CM

## 2018-06-08 DIAGNOSIS — Z7982 Long term (current) use of aspirin: Secondary | ICD-10-CM | POA: Diagnosis not present

## 2018-06-08 DIAGNOSIS — R197 Diarrhea, unspecified: Secondary | ICD-10-CM | POA: Diagnosis not present

## 2018-06-08 DIAGNOSIS — I2510 Atherosclerotic heart disease of native coronary artery without angina pectoris: Secondary | ICD-10-CM | POA: Diagnosis present

## 2018-06-08 DIAGNOSIS — R509 Fever, unspecified: Secondary | ICD-10-CM | POA: Diagnosis not present

## 2018-06-08 HISTORY — DX: Acute and chronic respiratory failure with hypoxia: J96.21

## 2018-06-08 LAB — HEPATIC FUNCTION PANEL
ALT: 14 U/L (ref 0–44)
AST: 19 U/L (ref 15–41)
Albumin: 4.2 g/dL (ref 3.5–5.0)
Alkaline Phosphatase: 78 U/L (ref 38–126)
Bilirubin, Direct: 0.1 mg/dL (ref 0.0–0.2)
Indirect Bilirubin: 0.6 mg/dL (ref 0.3–0.9)
Total Bilirubin: 0.7 mg/dL (ref 0.3–1.2)
Total Protein: 8.2 g/dL — ABNORMAL HIGH (ref 6.5–8.1)

## 2018-06-08 LAB — COMPREHENSIVE METABOLIC PANEL
ALT: 15 U/L (ref 0–44)
AST: 19 U/L (ref 15–41)
Albumin: 4 g/dL (ref 3.5–5.0)
Alkaline Phosphatase: 81 U/L (ref 38–126)
Anion gap: 14 (ref 5–15)
BUN: 24 mg/dL — ABNORMAL HIGH (ref 8–23)
CO2: 25 mmol/L (ref 22–32)
Calcium: 8.8 mg/dL — ABNORMAL LOW (ref 8.9–10.3)
Chloride: 95 mmol/L — ABNORMAL LOW (ref 98–111)
Creatinine, Ser: 0.91 mg/dL (ref 0.44–1.00)
GFR calc Af Amer: >60 mL/min (ref >60)
GFR calc non Af Amer: 60 mL/min — ABNORMAL LOW (ref >60)
Glucose, Bld: 124 mg/dL — ABNORMAL HIGH (ref 70–99)
Potassium: 3.9 mmol/L (ref 3.5–5.1)
Sodium: 134 mmol/L — ABNORMAL LOW (ref 135–145)
Total Bilirubin: 0.6 mg/dL (ref 0.3–1.2)
Total Protein: 8.2 g/dL — ABNORMAL HIGH (ref 6.5–8.1)

## 2018-06-08 LAB — URINALYSIS, ROUTINE W REFLEX MICROSCOPIC
BILIRUBIN URINE: NEGATIVE
Glucose, UA: NEGATIVE mg/dL
HGB URINE DIPSTICK: NEGATIVE
KETONES UR: 20 mg/dL — AB
NITRITE: NEGATIVE
PROTEIN: 30 mg/dL — AB
Specific Gravity, Urine: 1.025 (ref 1.005–1.030)
pH: 5 (ref 5.0–8.0)

## 2018-06-08 LAB — LACTIC ACID, PLASMA
Lactic Acid, Venous: 1 mmol/L (ref 0.5–1.9)
Lactic Acid, Venous: 1.1 mmol/L (ref 0.5–1.9)

## 2018-06-08 LAB — CBC WITH DIFFERENTIAL/PLATELET
Abs Immature Granulocytes: 0.16 10*3/uL — ABNORMAL HIGH (ref 0.00–0.07)
Basophils Absolute: 0 10*3/uL (ref 0.0–0.1)
Basophils Relative: 0 %
Eosinophils Absolute: 0 10*3/uL (ref 0.0–0.5)
Eosinophils Relative: 0 %
HCT: 40.5 % (ref 36.0–46.0)
Hemoglobin: 12.8 g/dL (ref 12.0–15.0)
Immature Granulocytes: 1 %
Lymphocytes Relative: 6 %
Lymphs Abs: 1.1 10*3/uL (ref 0.7–4.0)
MCH: 30.2 pg (ref 26.0–34.0)
MCHC: 31.6 g/dL (ref 30.0–36.0)
MCV: 95.5 fL (ref 80.0–100.0)
Monocytes Absolute: 1.3 10*3/uL — ABNORMAL HIGH (ref 0.1–1.0)
Monocytes Relative: 7 %
Neutro Abs: 15.1 10*3/uL — ABNORMAL HIGH (ref 1.7–7.7)
Neutrophils Relative %: 86 %
Platelets: 344 10*3/uL (ref 150–400)
RBC: 4.24 MIL/uL (ref 3.87–5.11)
RDW: 12.6 % (ref 11.5–15.5)
WBC: 17.7 10*3/uL — ABNORMAL HIGH (ref 4.0–10.5)
nRBC: 0 % (ref 0.0–0.2)

## 2018-06-08 LAB — MAGNESIUM: MAGNESIUM: 1.9 mg/dL (ref 1.7–2.4)

## 2018-06-08 LAB — INFLUENZA PANEL BY PCR (TYPE A & B)
Influenza A By PCR: NEGATIVE
Influenza B By PCR: NEGATIVE

## 2018-06-08 LAB — LIPASE, BLOOD: Lipase: 49 U/L (ref 11–51)

## 2018-06-08 LAB — PHOSPHORUS: Phosphorus: 2.9 mg/dL (ref 2.5–4.6)

## 2018-06-08 LAB — BRAIN NATRIURETIC PEPTIDE: B NATRIURETIC PEPTIDE 5: 52.8 pg/mL (ref 0.0–100.0)

## 2018-06-08 LAB — PROCALCITONIN: Procalcitonin: <0.1 ng/mL

## 2018-06-08 MED ORDER — PANTOPRAZOLE SODIUM 40 MG PO TBEC
40.0000 mg | DELAYED_RELEASE_TABLET | Freq: Every day | ORAL | Status: DC
  Administered 2018-06-08 – 2018-06-10 (×3): 40 mg via ORAL
  Filled 2018-06-08 (×3): qty 1

## 2018-06-08 MED ORDER — POLYETHYLENE GLYCOL 3350 17 G PO PACK: 17.0000 g | PACK | Freq: Every day | ORAL | Status: DC | PRN

## 2018-06-08 MED ORDER — MOMETASONE FURO-FORMOTEROL FUM 200-5 MCG/ACT IN AERO
2.0000 | INHALATION_SPRAY | Freq: Two times a day (BID) | RESPIRATORY_TRACT | Status: DC
  Administered 2018-06-08 – 2018-06-10 (×3): 2 via RESPIRATORY_TRACT
  Filled 2018-06-08: qty 8.8

## 2018-06-08 MED ORDER — SENNA 8.6 MG PO TABS
1.0000 | ORAL_TABLET | Freq: Two times a day (BID) | ORAL | Status: DC
  Administered 2018-06-09 – 2018-06-10 (×3): 8.6 mg via ORAL
  Filled 2018-06-08 (×3): qty 1

## 2018-06-08 MED ORDER — ONDANSETRON HCL 4 MG PO TABS: 4.0000 mg | ORAL_TABLET | Freq: Four times a day (QID) | ORAL | Status: DC | PRN

## 2018-06-08 MED ORDER — SODIUM CHLORIDE 0.9 % IV SOLN
1.0000 g | Freq: Three times a day (TID) | INTRAVENOUS | Status: DC
  Administered 2018-06-08 – 2018-06-10 (×5): 1 g via INTRAVENOUS
  Filled 2018-06-08 (×7): qty 1

## 2018-06-08 MED ORDER — ATORVASTATIN CALCIUM 40 MG PO TABS
40.0000 mg | ORAL_TABLET | Freq: Every morning | ORAL | Status: DC
  Administered 2018-06-09 – 2018-06-10 (×2): 40 mg via ORAL
  Filled 2018-06-08 (×2): qty 1

## 2018-06-08 MED ORDER — CALCIUM CARBONATE-VITAMIN D 500-200 MG-UNIT PO TABS
2.0000 | ORAL_TABLET | Freq: Every day | ORAL | Status: DC
  Administered 2018-06-09 – 2018-06-10 (×2): 2 via ORAL
  Filled 2018-06-08 (×2): qty 2

## 2018-06-08 MED ORDER — SODIUM CHLORIDE 0.9% FLUSH
3.0000 mL | Freq: Once | INTRAVENOUS | Status: AC
  Administered 2018-06-08: 3 mL via INTRAVENOUS

## 2018-06-08 MED ORDER — GABAPENTIN 100 MG PO CAPS
200.0000 mg | ORAL_CAPSULE | ORAL | Status: DC
  Administered 2018-06-09 – 2018-06-10 (×4): 200 mg via ORAL
  Filled 2018-06-08 (×3): qty 2

## 2018-06-08 MED ORDER — HYDROXYCHLOROQUINE SULFATE 200 MG PO TABS
200.0000 mg | ORAL_TABLET | Freq: Every day | ORAL | Status: DC
  Administered 2018-06-08 – 2018-06-10 (×3): 200 mg via ORAL
  Filled 2018-06-08 (×3): qty 1

## 2018-06-08 MED ORDER — GABAPENTIN 300 MG PO CAPS
300.0000 mg | ORAL_CAPSULE | Freq: Every day | ORAL | Status: DC
  Administered 2018-06-08 – 2018-06-09 (×2): 300 mg via ORAL
  Filled 2018-06-08 (×2): qty 1

## 2018-06-08 MED ORDER — ACETAMINOPHEN 650 MG RE SUPP: 650.0000 mg | Freq: Four times a day (QID) | RECTAL | Status: DC | PRN

## 2018-06-08 MED ORDER — SERTRALINE HCL 100 MG PO TABS
100.0000 mg | ORAL_TABLET | Freq: Every day | ORAL | Status: DC
  Administered 2018-06-08 – 2018-06-10 (×3): 100 mg via ORAL
  Filled 2018-06-08 (×3): qty 1

## 2018-06-08 MED ORDER — SODIUM CHLORIDE (PF) 0.9 % IJ SOLN
INTRAMUSCULAR | Status: AC
  Filled 2018-06-08: qty 50

## 2018-06-08 MED ORDER — FOLIC ACID 1 MG PO TABS
1.0000 mg | ORAL_TABLET | Freq: Every day | ORAL | Status: DC
  Administered 2018-06-08 – 2018-06-10 (×3): 1 mg via ORAL
  Filled 2018-06-08 (×3): qty 1

## 2018-06-08 MED ORDER — VANCOMYCIN HCL IN DEXTROSE 1-5 GM/200ML-% IV SOLN
1000.0000 mg | INTRAVENOUS | Status: DC
  Administered 2018-06-09: 1000 mg via INTRAVENOUS
  Filled 2018-06-08: qty 200

## 2018-06-08 MED ORDER — ALBUTEROL SULFATE (2.5 MG/3ML) 0.083% IN NEBU
5.0000 mg | INHALATION_SOLUTION | Freq: Once | RESPIRATORY_TRACT | Status: AC
  Administered 2018-06-08: 5 mg via RESPIRATORY_TRACT
  Filled 2018-06-08: qty 6

## 2018-06-08 MED ORDER — VANCOMYCIN HCL 10 G IV SOLR
1500.0000 mg | Freq: Once | INTRAVENOUS | Status: AC
  Administered 2018-06-08: 1500 mg via INTRAVENOUS
  Filled 2018-06-08: qty 1500

## 2018-06-08 MED ORDER — GABAPENTIN 300 MG PO CAPS: 300.0000 mg | ORAL_CAPSULE | Freq: Every day | ORAL | Status: DC

## 2018-06-08 MED ORDER — AZITHROMYCIN 250 MG PO TABS
250.0000 mg | ORAL_TABLET | Freq: Every day | ORAL | Status: DC
  Administered 2018-06-09 – 2018-06-10 (×2): 250 mg via ORAL
  Filled 2018-06-08 (×2): qty 1

## 2018-06-08 MED ORDER — SODIUM CHLORIDE 0.9 % IV SOLN
1.0000 g | Freq: Once | INTRAVENOUS | Status: AC
  Administered 2018-06-08: 1 g via INTRAVENOUS
  Filled 2018-06-08: qty 10

## 2018-06-08 MED ORDER — ONDANSETRON HCL 4 MG/2ML IJ SOLN: 4.0000 mg | Freq: Four times a day (QID) | INTRAMUSCULAR | Status: DC | PRN

## 2018-06-08 MED ORDER — UMECLIDINIUM BROMIDE 62.5 MCG/INH IN AEPB
1.0000 | INHALATION_SPRAY | Freq: Every day | RESPIRATORY_TRACT | Status: DC
  Administered 2018-06-09: 1 via RESPIRATORY_TRACT
  Filled 2018-06-08: qty 7

## 2018-06-08 MED ORDER — ASPIRIN EC 81 MG PO TBEC
81.0000 mg | DELAYED_RELEASE_TABLET | Freq: Every day | ORAL | Status: DC
  Administered 2018-06-08 – 2018-06-10 (×3): 81 mg via ORAL
  Filled 2018-06-08 (×3): qty 1

## 2018-06-08 MED ORDER — ACETAMINOPHEN 325 MG PO TABS
650.0000 mg | ORAL_TABLET | Freq: Four times a day (QID) | ORAL | Status: DC | PRN
  Administered 2018-06-08: 650 mg via ORAL
  Filled 2018-06-08: qty 2

## 2018-06-08 MED ORDER — IPRATROPIUM-ALBUTEROL 0.5-2.5 (3) MG/3ML IN SOLN
3.0000 mL | RESPIRATORY_TRACT | Status: DC
  Administered 2018-06-08: 3 mL via RESPIRATORY_TRACT
  Filled 2018-06-08: qty 3

## 2018-06-08 MED ORDER — PREDNISONE 20 MG PO TABS
40.0000 mg | ORAL_TABLET | Freq: Every day | ORAL | Status: DC
  Administered 2018-06-08 – 2018-06-10 (×2): 40 mg via ORAL
  Filled 2018-06-08 (×3): qty 2

## 2018-06-08 MED ORDER — DOCUSATE SODIUM 100 MG PO CAPS
100.0000 mg | ORAL_CAPSULE | Freq: Two times a day (BID) | ORAL | Status: DC
  Administered 2018-06-09 – 2018-06-10 (×3): 100 mg via ORAL
  Filled 2018-06-08 (×3): qty 1

## 2018-06-08 MED ORDER — ENOXAPARIN SODIUM 40 MG/0.4ML ~~LOC~~ SOLN
40.0000 mg | SUBCUTANEOUS | Status: DC
  Administered 2018-06-08 – 2018-06-09 (×2): 40 mg via SUBCUTANEOUS
  Filled 2018-06-08 (×3): qty 0.4

## 2018-06-08 MED ORDER — IOPAMIDOL (ISOVUE-300) INJECTION 61%
INTRAVENOUS | Status: AC
  Filled 2018-06-08: qty 100

## 2018-06-08 MED ORDER — SODIUM CHLORIDE 0.9 % IV SOLN
500.0000 mg | Freq: Once | INTRAVENOUS | Status: AC
  Administered 2018-06-08: 500 mg via INTRAVENOUS
  Filled 2018-06-08: qty 500

## 2018-06-08 MED ORDER — VITAMIN B-12 1000 MCG PO TABS
1000.0000 ug | ORAL_TABLET | Freq: Every day | ORAL | Status: DC
  Administered 2018-06-08 – 2018-06-10 (×3): 1000 ug via ORAL
  Filled 2018-06-08 (×3): qty 1

## 2018-06-08 MED ORDER — GABAPENTIN 100 MG PO CAPS: 200.0000 mg | ORAL_CAPSULE | ORAL | Status: DC

## 2018-06-08 MED ORDER — IOPAMIDOL (ISOVUE-300) INJECTION 61%: 100.0000 mL | Freq: Once | INTRAVENOUS | Status: DC | PRN

## 2018-06-08 MED ORDER — LACTATED RINGERS IV SOLN
INTRAVENOUS | Status: AC
  Administered 2018-06-08: 23:00:00 via INTRAVENOUS

## 2018-06-08 MED ORDER — TIOTROPIUM BROMIDE MONOHYDRATE 18 MCG IN CAPS: 18.0000 ug | ORAL_CAPSULE | Freq: Every morning | RESPIRATORY_TRACT | Status: DC

## 2018-06-08 MED ORDER — CALCIUM-VITAMIN D-VITAMIN K 500-1000-40 MG-UNT-MCG PO CHEW: CHEWABLE_TABLET | Freq: Every day | ORAL | Status: DC

## 2018-06-08 MED ORDER — SODIUM CHLORIDE 0.9 % IV BOLUS
500.0000 mL | Freq: Once | INTRAVENOUS | Status: AC
  Administered 2018-06-08: 500 mL via INTRAVENOUS

## 2018-06-08 NOTE — Progress Notes (Signed)
Pharmacy Antibiotic Note  [name] is a 83 y.o. female admitted on 06/08/2018 with pneumonia.  Pharmacy has been consulted for vancomycin dosing.  She has been given Rocephin 1 gm and azithromycin 500 mg in the ED.  WBC 17.7. AF, flu negative. CXR: patchy B lung opacities concerning for PNA.   Plan: Height and weight ordered - not yet done - used ht 60 inched and weight 78.7 kg from 08/02/17 - f/u current height and weight Vancomycin 1500 mg IV loading dose Vancomycin 1000 mg IV Q 24 hrs. Goal AUC 400-550. Expected AUC: 522 SCr used: 0.91, using IBW/TBW Vd 0.72 instead of 0.5 for BMI 33.8 F/u renal function, WBC, temp, culture data Vancomycin levels as needed    Temp (24hrs), Avg:98.2 F (36.8 C), Min:98.2 F (36.8 C), Max:98.2 F (36.8 C)  Recent Labs  Lab 06/08/18 1548 06/08/18 1752  WBC 17.7*  --   CREATININE 0.91  --   LATICACIDVEN 1.0 1.1    CrCl cannot be calculated (Unknown ideal weight.).    Allergies  Allergen Reactions  . Kapidex [Dexlansoprazole] Other (See Comments)    Stomach pain, diarrhea  . Zegerid [Omeprazole] Other (See Comments)    Burning in chest    Antimicrobials this admission: 2/16 CTX 2/16 azith 2/16 vanc >> Dose adjustments this admission:  Microbiology results: 2/16 BCx2 (obtained 1640 & 1642 prior to abx) >> 2/16 flu neg/neg 2/16 Ucx> ordered  Thank you for allowing pharmacy to be a part of this patient's care.  Eudelia Bunch, Pharm.D [phone] 06/08/2018 7:16 PM

## 2018-06-08 NOTE — ED Notes (Signed)
BLOOD CULTURES WERE OBTAIN BEFORE THE THE ANTIBIOTIC WAS GIVEN AT [facility]. BLOOD CULTURE OBTAIN AT 1640 AND 1642. I CLICK IT OFF AFTERWARD AND I CAN NOT CHANGE THAT.

## 2018-06-08 NOTE — ED Provider Notes (Signed)
[facility] DEPT Provider Note   CSN: [id number] Arrival date & time: 06/08/18  1443  History   Chief Complaint Chief Complaint  Patient presents with  . Nausea  . Generalized Body Aches  . Fever  . Emesis  . Diarrhea    HPI [name] is a 83 y.o. female with past medical history significant for COPD, on chronic home oxygen 2 L nasal cannula, GERD, RA, hypertension resents for evaluation of cough and fever.  Patient states she has had a fever x2 days.  Has not taken her temperature, however has felt warm and had chills.  Has had cough productive of white and light yellow sputum.  Patient also endorses nausea and nonbloody, nonbilious emesis.  Has had 7 episodes of nonbloody diarrhea.  Admits to abdominal pain which she rates a 6/10.  Pain does not radiate.  Prior abdominal surgeries.  No recent antibiotics or travel.  States she has been more short of breath over the last 2 days.  Has not been using her home albuterol treatments, nebulizers or Symbicort.  Denies prior intubation for COPD.  Unsure if she received influenza vaccine.  Has not been able to keep down solid food x2 days.  Has been able to take small sips of liquids without difficulty.  Admits to congestion and rhinorrhea.  Denies headache, vision changes, neck pain, neck stiffness, chest pain or pelvic pain.  Patient states she has had dysuria x1 day.  History obtained from patient and family. No interpretor was used   HPI  Past Medical History:  Diagnosis Date  . Arthritis   . Bladder neoplasm   . Chronic cough   . Chronic respiratory failure ([place])   . COPD (chronic obstructive pulmonary disease) ([facility])    PULMOLOGIST-  DR [name]--  GOLD III W/  CHRONIC RESPIRATORY FAILURE--  O2 DEPENDENT  . Coronary arteriosclerosis   . Cough productive of purulent sputum   . GERD (gastroesophageal reflux disease)   . Hematuria   . History of nephritis    as child dx w/ Bright's disease  (glomerulonephritis)  . Mild obstructive sleep apnea    no cpap recommendation  . Neuropathy, peripheral, idiopathic   . Pelvic pain in female   . PONV (postoperative nausea and vomiting)   . Supplemental oxygen dependent    2L  via Nasal Canula --  activity and nighttime  . Urinary incontinence in female     Patient Active Problem List   Diagnosis Date Noted  . Acute respiratory failure with hypercapnia ([place])   . COPD exacerbation ([place]) 07/29/2017  . Rheumatoid arthritis ([place]) 07/29/2017  . Neuropathy 07/29/2017  . Acute respiratory failure with hypoxia ([place]) 07/29/2017  . Anemia 05/20/2016  . Hyperglycemia 05/20/2016  . Shoulder impingement syndrome 03/25/2015  . Chronic respiratory failure ([place]) 04/10/2012  . Constipation 07/03/2011  . COPD with acute exacerbation ([place]) 06/29/2011  . Stage 3 severe COPD by GOLD classification ([place]) 06/19/2007  . HLD (hyperlipidemia) 05/15/2007  . ACID REFLUX DISEASE 05/15/2007    Past Surgical History:  Procedure Laterality Date  . APPENDECTOMY  1971  . CARDIAC CATHETERIZATION  05-08-2007  dr Marlou Porch   minor coronary plaquing w/ no significant CAD/  20% mLAD,  10% mRCA,  perserved LV, ef 60-65%  . CATARACT EXTRACTION W/ INTRAOCULAR LENS  IMPLANT, BILATERAL  2001 approx  . CHOLECYSTECTOMY  1988  . CYSTOSCOPY W/ RETROGRADES Bilateral 09/24/2014   Procedure: CYSTOSCOPY WITH RETROGRADE PYELOGRAM;  Surgeon: Festus Aloe, MD;  Location: [facility];  Service: Urology;  Laterality: Bilateral;  . CYSTOSCOPY WITH BIOPSY N/A 09/24/2014   Procedure: CYSTOSCOPY WITH BIOPSY;  Surgeon: Festus Aloe, MD;  Location: [facility];  Service: Urology;  Laterality: N/A;  . ESOPHAGOGASTRODUODENOSCOPY  last one 09-09-2008  . FULGURATION OF BLADDER TUMOR N/A 09/24/2014   Procedure: FULGURATION OF BLADDER TUMOR;  Surgeon: Festus Aloe, MD;  Location: [facility];  Service: Urology;  Laterality: N/A;  . [place]  . POSTERIOR LUMBAR FUSION  10-07-2007   bilateral laminectomy L4 and bilateral L4-5 diskectomy w/ fusion  . TONSILLECTOMY  as child  . TOTAL ABDOMINAL HYSTERECTOMY W/ BILATERAL SALPINGOOPHORECTOMY  1973  . TRANSTHORACIC ECHOCARDIOGRAM  07-07-2011   normal echo,  ef 65-70%     OB History   No obstetric history on file.      Home Medications    Prior to Admission medications   Medication Sig Start Date End Date Taking? Authorizing Provider  acetaminophen (TYLENOL) 325 MG tablet Take 2 tablets (650 mg total) by mouth every 6 (six) hours as needed for mild pain (or Fever >/= 101). 08/02/17   Emokpae, Courage, MD  albuterol (PROVENTIL HFA;VENTOLIN HFA) 108 (90 BASE) MCG/ACT inhaler Inhale 2 puffs into the lungs every 6 (six) hours as needed. For shortness of breath.    [provider]  albuterol (PROVENTIL) (2.5 MG/3ML) 0.083% nebulizer solution Take 3 mLs (2.5 mg total) by nebulization every 4 (four) hours as needed for wheezing or shortness of breath. For shortness of breath. 07/28/17   Roxan Hockey, MD  albuterol (PROVENTIL) (2.5 MG/3ML) 0.083% nebulizer solution Take 3 mLs (2.5 mg total) by nebulization 3 (three) times daily. 08/02/17   Roxan Hockey, MD  aspirin EC 81 MG tablet Take 81 mg by mouth daily.    [provider]  atorvastatin (LIPITOR) 40 MG tablet Take 40 mg by mouth every morning.     [provider]  azelastine (ASTELIN) 0.1 % nasal spray Place 1 spray into the nose 2 (two) times daily.    [provider]  bisacodyl (DULCOLAX) 5 MG EC tablet Take 1 tablet (5 mg total) by mouth daily as needed for moderate constipation. 08/02/17   Roxan Hockey, MD  budesonide-formoterol (SYMBICORT) 160-4.5 MCG/ACT inhaler Inhale 2 puffs into the lungs 2 (two) times daily. 08/02/17   Roxan Hockey, MD  Calcium Carbonate-Vitamin D (CALCIUM + D PO) Take 2 tablets by mouth daily.     [provider]  carboxymethylcellulose  (REFRESH PLUS) 0.5 % SOLN 2 drops 2 (two) times daily as needed (dry eye).    [provider]  cetirizine (ZYRTEC) 10 MG tablet Take 1 tablet (10 mg total) by mouth every morning. 08/02/17   Roxan Hockey, MD  Cholecalciferol (VITAMIN D) 2000 UNITS tablet Take 2,000 Units by mouth daily.    [provider]  cyanocobalamin 1000 MCG tablet Take 1 tablet (1,000 mcg total) by mouth daily. 08/02/17   Roxan Hockey, MD  esomeprazole (NEXIUM) 40 MG capsule Take 1 capsule (40 mg total) by mouth every morning. 08/02/17   Roxan Hockey, MD  feeding supplement, ENSURE ENLIVE, (ENSURE ENLIVE) LIQD Take 237 mLs by mouth 2 (two) times daily between meals. 08/02/17   Roxan Hockey, MD  folic acid (FOLVITE) 1 MG tablet Take 1 tablet (1 mg total) by mouth daily. 08/02/17   Roxan Hockey, MD  gabapentin (NEURONTIN) 100 MG capsule Take 2-3 capsules (200-300 mg total) by mouth  3 (three) times daily. 200 mg every AM and at noon and 300 mg QHS 08/02/17   Roxan Hockey, MD  hydroxychloroquine (PLAQUENIL) 200 MG tablet Take 200 mg by mouth daily.    [provider]  ipratropium-albuterol (DUONEB) 0.5-2.5 (3) MG/3ML SOLN Take 3 mLs by nebulization 2 (two) times daily.    [provider]  methotrexate (RHEUMATREX) 2.5 MG tablet Take 15 mg by mouth once a week.  06/05/17   [provider]  mupirocin ointment (BACTROBAN) 2 % Place 1 application into the nose 2 (two) times daily. 08/02/17   Roxan Hockey, MD  ondansetron (ZOFRAN) 4 MG tablet Take 1 tablet (4 mg total) by mouth every 6 (six) hours as needed for nausea. 08/02/17   Roxan Hockey, MD  polyethylene glycol (MIRALAX / GLYCOLAX) packet Take 17 g by mouth daily. 08/03/17   Roxan Hockey, MD  polyvinyl alcohol (LIQUIFILM TEARS) 1.4 % ophthalmic solution Place 2 drops into both eyes as needed for dry eyes. 08/02/17   Roxan Hockey, MD  predniSONE (DELTASONE) 20 MG tablet Take 2 tablets (40 mg total) by mouth  daily with breakfast. 08/02/17   Roxan Hockey, MD  sertraline (ZOLOFT) 100 MG tablet Take 100 mg by mouth daily.    [provider]  tiotropium (SPIRIVA) 18 MCG inhalation capsule Place 1 capsule (18 mcg total) into inhaler and inhale every morning. 08/02/17   Roxan Hockey, MD  vitamin A 10000 UNIT capsule Take 10,000 Units by mouth daily.    [provider]  vitamin E 400 UNIT capsule Take 400 Units by mouth daily.    [provider]    Family History Family History  Problem Relation Age of Onset  . Asthma Maternal Grandmother     Social History Social History   Tobacco Use  . Smoking status: Former Smoker    Packs/day: 3.00    Years: 60.00    Pack years: 180.00    Types: Cigarettes    Last attempt to quit: 06/25/2011    Years since quitting: 6.9  . Smokeless tobacco: Never Used  Substance Use Topics  . Alcohol use: No  . Drug use: No     Allergies   Kapidex [dexlansoprazole] and Zegerid [omeprazole]   Review of Systems Review of Systems  Constitutional: Positive for appetite change, chills and fever.  HENT: Positive for congestion, postnasal drip and rhinorrhea. Negative for sinus pressure, sinus pain, sneezing, sore throat, tinnitus, trouble swallowing and voice change.   Eyes: Negative.   Respiratory: Positive for cough, shortness of breath and wheezing. Negative for apnea, choking, chest tightness and stridor.   Cardiovascular: Negative.   Gastrointestinal: Positive for abdominal pain, diarrhea, nausea and vomiting. Negative for anal bleeding, blood in stool, constipation and rectal pain.  Genitourinary: Positive for dysuria. Negative for difficulty urinating.  Musculoskeletal: Negative.   Skin: Negative.   Neurological: Negative.   All other systems reviewed and are negative.  Physical Exam Updated Vital Signs BP (!) 118/55   Pulse (!) 111   Temp 98.2 F (36.8 C) (Oral)   Resp 19   SpO2 90%   Physical Exam Vitals signs  and nursing note reviewed.  Constitutional:      General: She is not in acute distress.    Appearance: She is well-developed. She is not toxic-appearing.  HENT:     Head: Normocephalic and atraumatic.     Nose:     Comments: Clear rhinorrhea to bilateral nares.    Mouth/Throat:  Comments: Posterior oropharynx clear without erythema.  Uvula midline without deviation.  No tonsillar edema or exudate. Eyes:     Pupils: Pupils are equal, round, and reactive to light.  Neck:     Musculoskeletal: Normal range of motion.     Comments: No neck stiffness or neck rigidity.  No cervical lymphadenopathy. Cardiovascular:     Rate and Rhythm: Tachycardia present.     Pulses: Normal pulses.  Pulmonary:     Effort: No respiratory distress.     Comments: Mild diffuse expiratory wheezing.  No accessory muscle usage.  Able to speak in full sentences without difficulty. Abdominal:     General: There is no distension.     Comments: Generalized abdominal tenderness palpation.  No rebound or guarding.  Normoactive bowel sounds.  No CVA tenderness.  Musculoskeletal: Normal range of motion.     Comments: Moves all extremities without difficulty.  No lower extremity swelling, erythema, warmth.  Skin:    General: Skin is warm and dry.     Comments: No rashes or lesions.  Brisk capillary refill.  Neurological:     Mental Status: She is alert.      ED Treatments / Results  Labs (all labs ordered are listed, but only abnormal results are displayed) Labs Reviewed  COMPREHENSIVE METABOLIC PANEL - Abnormal; Notable for the following components:      Result Value   Sodium 134 (*)    Chloride 95 (*)    Glucose, Bld 124 (*)    BUN 24 (*)    Calcium 8.8 (*)    Total Protein 8.2 (*)    GFR calc non Af Amer 60 (*)    All other components within normal limits  CBC WITH DIFFERENTIAL/PLATELET - Abnormal; Notable for the following components:   WBC 17.7 (*)    Neutro Abs 15.1 (*)    Monocytes Absolute 1.3  (*)    Abs Immature Granulocytes 0.16 (*)    All other components within normal limits  URINE CULTURE  CULTURE, BLOOD (ROUTINE X 2)  CULTURE, BLOOD (ROUTINE X 2)  LACTIC ACID, PLASMA  LACTIC ACID, PLASMA  INFLUENZA PANEL BY PCR (TYPE A & B)  URINALYSIS, ROUTINE W REFLEX MICROSCOPIC    EKG None  Radiology Dg Chest 2 View  Result Date: 06/08/2018 CLINICAL DATA:  Cough, fever, and flu like symptoms. EXAM: CHEST - 2 VIEW COMPARISON:  03/24/2018 FINDINGS: The cardiomediastinal silhouette is unchanged with normal heart size. There is new patchy opacity laterally in the right mid lung which may be within the superior segment of the lower lobe. Additional patchy opacities are present in the lateral right lung base and lateral left mid upper lung. No pleural effusion or pneumothorax is identified. Mild thoracic levoscoliosis is noted. IMPRESSION: Patchy bilateral lung opacities concerning for pneumonia. Electronically Signed   By: Logan Bores M.D.   On: 06/08/2018 16:10    Procedures .Critical Care Performed by: Nettie Elm, PA-C Authorized by: Nettie Elm, PA-C   Critical care provider statement:    Critical care time (minutes):  36   Critical care was necessary to treat or prevent imminent or life-threatening deterioration of the following conditions:  Sepsis   Critical care was time spent personally by me on the following activities:  Discussions with consultants, evaluation of patient's response to treatment, examination of patient, ordering and performing treatments and interventions, ordering and review of laboratory studies, ordering and review of radiographic studies, pulse oximetry, re-evaluation of patient's condition,  obtaining history from patient or surrogate and review of old charts   (including critical care time)  Medications Ordered in ED Medications  azithromycin (ZITHROMAX) 500 mg in sodium chloride 0.9 % 250 mL IVPB (500 mg Intravenous New Bag/Given  06/08/18 1842)  iopamidol (ISOVUE-300) 61 % injection 100 mL (has no administration in time range)  iopamidol (ISOVUE-300) 61 % injection (has no administration in time range)  sodium chloride (PF) 0.9 % injection (has no administration in time range)  sodium chloride flush (NS) 0.9 % injection 3 mL (3 mLs Intravenous Given 06/08/18 1647)  albuterol (PROVENTIL) (2.5 MG/3ML) 0.083% nebulizer solution 5 mg (5 mg Nebulization Given 06/08/18 1647)  sodium chloride 0.9 % bolus 500 mL (0 mLs Intravenous Stopped 06/08/18 1746)  cefTRIAXone (ROCEPHIN) 1 g in sodium chloride 0.9 % 100 mL IVPB (0 g Intravenous Stopped 06/08/18 1746)     Initial Impression / Assessment and Plan / ED Course  I have reviewed the triage vital signs and the nursing notes.  Pertinent labs & imaging results that were available during my care of the patient were reviewed by me and considered in my medical decision making (see chart for details).  83 year old female presents for evaluation of fever and cough.  Symptom onset 2 days ago.  Unsure if received influenza vaccine.  Afebrile department, however has been having subjective fevers at home.  Productive cough of yellow sputum. Diffuse expiratory wheezing on exam, known history of COPD with chronic hypoxia on 2 L nasal cannula at baseline.  CBC with leukocytosis at 17.7.  Tachycardic at 110, oxygen saturation 90% on home oxygen.  Mild tachypnea on evaluation.  Has not taken home nebulizer treatments, will provide albuterol treatment in ED.  Chest x-ray with bilateral infiltrates, consistent with pneumonia. Given tachycardia, tachypnea, leukocytosis will initiate code sepsis.  Will give Abx and fluids. No recent travel or Abx use. Has also had multiple episodes of emesis and nonbloody diarrhea with generalized abdominal tenderness.  Will obtain CT abdomen and reevaluate. Urinalysis negative for infection.  Patient refusing CT abd/pelvis. Discussed risk vs benefit. Patient continues to  deny CT. Patient with improvement in breath sounds with her breathing treatment.  Lungs clear to auscultation.  Patient has required increase oxygen to 3 L nasal cannula.  Mildly tachycardic.  Patient has been given IV fluids as well as antibiotics.  Will consult with hospitalist for admission. Flu negative. Lactic acid 1.1.  1645: Reevaluation abdomen soft, nontender without rebound or guarding.  Lungs clear to auscultation bilateral without wheeze, rhonchi rales.  No accessory muscle usage.  Patient continuing to require 3 L oxygen via nasal cannula.  1915: Consulted with Dr. Jonnie Finner who agrees for admission.  Patient has been seen and evaluated by my attending who agrees with above treatment, plan and disposition.   Final Clinical Impressions(s) / ED Diagnoses   Final diagnoses:  Community acquired pneumonia, unspecified laterality  COPD exacerbation [facility])    ED Discharge Orders    None       Faustino Luecke A, PA-C 06/08/18 2341    Virgel Manifold, MD 06/09/18 [phone]

## 2018-06-08 NOTE — ED Notes (Signed)
RN attempted to give report. Rip Harbour RN will call back.

## 2018-06-08 NOTE — ED Triage Notes (Signed)
Pt c/o flu like symptoms x 2 days. Pt reports cough, fever, vomiting diarrhea.

## 2018-06-08 NOTE — ED Notes (Signed)
ED TO INPATIENT HANDOFF REPORT  Name/Age/Gender [name] 83 y.o. female  Code Status Code Status History    Date Active Date Inactive Code Status Order ID Comments User Context   07/29/2017 1451 08/02/2017 1955 Full Code [id number]  Janece Canterbury, MD ED   07/28/2017 0539 07/28/2017 1637 Full Code [id number]  Cristy Folks, MD Inpatient   05/13/2017 2011 05/17/2017 2007 Full Code [id number]  Robbie Lis, MD ED   05/20/2016 2217 05/23/2016 1707 Full Code [id number]  Jani Gravel, MD Inpatient   03/25/2015 1054 03/29/2015 2149 Full Code [id number]  Marcellus Scott Inpatient   07/12/2011 1911 07/13/2011 1741 Full Code [id number]  Samuella Cota, MD Inpatient      Home/SNF/Other Home  Chief Complaint Flu-Like Symptoms  Level of Care/Admitting Diagnosis ED Disposition    ED Disposition Condition [facility] Area: [facility] [[id number]]  Level of Care: Med-Surg [16]  Diagnosis: Acute on chronic respiratory failure with hypoxia [facility]) [[id number]]  Admitting Physician: Bennie Pierini [[id number]]  Attending Physician: Jonnie Finner, ADAM ROSS [[id number]]  Estimated length of stay: past midnight tomorrow  Certification:: I certify this patient will need inpatient services for at least 2 midnights  PT Class (Do Not Modify): Inpatient [101]  PT Acc Code (Do Not Modify): Private [1]       Medical History Past Medical History:  Diagnosis Date  . Arthritis   . Bladder neoplasm   . Chronic cough   . Chronic respiratory failure ([place])   . COPD (chronic obstructive pulmonary disease) (HCC)    PULMOLOGIST-  DR [name]--  GOLD III W/  CHRONIC RESPIRATORY FAILURE--  O2 DEPENDENT  . Coronary arteriosclerosis   . Cough productive of purulent sputum   . GERD (gastroesophageal reflux disease)   . Hematuria   . History of nephritis    as child dx w/ Bright's disease (glomerulonephritis)  . Mild obstructive sleep apnea    no cpap recommendation  . Neuropathy,  peripheral, idiopathic   . Pelvic pain in female   . PONV (postoperative nausea and vomiting)   . Supplemental oxygen dependent    2L  via Nasal Canula --  activity and nighttime  . Urinary incontinence in female     Allergies Allergies  Allergen Reactions  . Kapidex [Dexlansoprazole] Other (See Comments)    Stomach pain, diarrhea  . Zegerid [Omeprazole] Other (See Comments)    Burning in chest    IV Location/Drains/Wounds Patient Lines/Drains/Airways Status   Active Line/Drains/Airways    Name:   Placement date:   Placement time:   Site:   Days:   Peripheral IV 06/08/18 Right Wrist   06/08/18    1643    Wrist   less than 1   External Urinary Catheter   07/29/17    1500    -   314   Wound / Incision (Open or Dehisced) 07/29/17 Buttocks Right   07/29/17    1744    Buttocks   314          Labs/Imaging Results for orders placed or performed during the hospital encounter of 06/08/18 (from the past 48 hour(s))  Lactic acid, plasma     Status: None   Collection Time: 06/08/18  3:48 PM  Result Value Ref Range   Lactic Acid, Venous 1.0 0.5 - 1.9 mmol/L    Comment: Performed at [facility], [place] [address], [place], [place] [address]  Comprehensive metabolic panel  Status: Abnormal   Collection Time: 06/08/18  3:48 PM  Result Value Ref Range   Sodium 134 (L) 135 - 145 mmol/L   Potassium 3.9 3.5 - 5.1 mmol/L   Chloride 95 (L) 98 - 111 mmol/L   CO2 25 22 - 32 mmol/L   Glucose, Bld 124 (H) 70 - 99 mg/dL   BUN 24 (H) 8 - 23 mg/dL   Creatinine, Ser 0.91 0.44 - 1.00 mg/dL   Calcium 8.8 (L) 8.9 - 10.3 mg/dL   Total Protein 8.2 (H) 6.5 - 8.1 g/dL   Albumin 4.0 3.5 - 5.0 g/dL   AST 19 15 - 41 U/L   ALT 15 0 - 44 U/L   Alkaline Phosphatase 81 38 - 126 U/L   Total Bilirubin 0.6 0.3 - 1.2 mg/dL   GFR calc non Af Amer 60 (L) >60 mL/min   GFR calc Af Amer >60 >60 mL/min   Anion gap 14 5 - 15    Comment: Performed at [facility], [place]  [address], [place], [place] [address]  CBC with Differential     Status: Abnormal   Collection Time: 06/08/18  3:48 PM  Result Value Ref Range   WBC 17.7 (H) 4.0 - 10.5 K/uL   RBC 4.24 3.87 - 5.11 MIL/uL   Hemoglobin 12.8 12.0 - 15.0 g/dL   HCT 40.5 36.0 - 46.0 %   MCV 95.5 80.0 - 100.0 fL   MCH 30.2 26.0 - 34.0 pg   MCHC 31.6 30.0 - 36.0 g/dL   RDW 12.6 11.5 - 15.5 %   Platelets 344 150 - 400 K/uL   nRBC 0.0 0.0 - 0.2 %   Neutrophils Relative % 86 %   Neutro Abs 15.1 (H) 1.7 - 7.7 K/uL   Lymphocytes Relative 6 %   Lymphs Abs 1.1 0.7 - 4.0 K/uL   Monocytes Relative 7 %   Monocytes Absolute 1.3 (H) 0.1 - 1.0 K/uL   Eosinophils Relative 0 %   Eosinophils Absolute 0.0 0.0 - 0.5 K/uL   Basophils Relative 0 %   Basophils Absolute 0.0 0.0 - 0.1 K/uL   Immature Granulocytes 1 %   Abs Immature Granulocytes 0.16 (H) 0.00 - 0.07 K/uL    Comment: Performed at [facility], [place] [address], [place], [place] [address]  Lactic acid, plasma     Status: None   Collection Time: 06/08/18  5:52 PM  Result Value Ref Range   Lactic Acid, Venous 1.1 0.5 - 1.9 mmol/L    Comment: Performed at [facility], [place] [address], [place], [place] [address]  Influenza panel by PCR (type A & B)     Status: None   Collection Time: 06/08/18  5:52 PM  Result Value Ref Range   Influenza A By PCR NEGATIVE NEGATIVE   Influenza B By PCR NEGATIVE NEGATIVE    Comment: (NOTE) The Xpert Xpress Flu assay is intended as an aid in the diagnosis of  influenza and should not be used as a sole basis for treatment.  This  assay is FDA approved for nasopharyngeal swab specimens only. Nasal  washings and aspirates are unacceptable for Xpert Xpress Flu testing. Performed at [facility], [place] [address] [place], [place] [address]    Dg Chest 2 View  Result Date: 06/08/2018 CLINICAL DATA:  Cough, fever, and flu like symptoms. EXAM: CHEST - 2 VIEW COMPARISON:  03/24/2018  FINDINGS: The cardiomediastinal silhouette is unchanged with normal  heart size. There is new patchy opacity laterally in the right mid lung which may be within the superior segment of the lower lobe. Additional patchy opacities are present in the lateral right lung base and lateral left mid upper lung. No pleural effusion or pneumothorax is identified. Mild thoracic levoscoliosis is noted. IMPRESSION: Patchy bilateral lung opacities concerning for pneumonia. Electronically Signed   By: Logan Bores M.D.   On: 06/08/2018 16:10    Pending Labs Unresulted Labs (From admission, onward)    Start     Ordered   06/08/18 1943  Gastrointestinal Panel by PCR , Stool  (Gastrointestinal Panel by PCR, Stool)  Once,   R     06/08/18 1942   06/08/18 1943  C Difficile Quick Screen w PCR reflex  (Gastrointestinal Panel by PCR, Stool)  Once, for 24 hours,   R     06/08/18 1942   06/08/18 1634  Blood Culture (routine x 2)  BLOOD CULTURE X 2,   STAT     06/08/18 1634   06/08/18 1628  Urine Culture  Add-on,   STAT     06/08/18 1628   06/08/18 1521  Urinalysis, Routine w reflex microscopic  ONCE - STAT,   STAT     06/08/18 1520   Signed and Held  Culture, sputum-assessment  Once,   R    Question:  Patient immune status  Answer:  Immunocompromised   Signed and Held   Signed and Held  Gram stain  Once,   R    Question:  Patient immune status  Answer:  Immunocompromised   Signed and Held   Visual merchandiser and Held  Strep pneumoniae urinary antigen  Once,   R     Signed and Held   Visual merchandiser and Held  Phosphorus  Add-on,   R     Signed and Held   Visual merchandiser and Held  Magnesium  Add-on,   R     Signed and Held   Visual merchandiser and Held  Hepatic function panel  Add-on,   R     Signed and Held   Visual merchandiser and Held  Lipase, blood  Add-on,   R     Signed and Held   Visual merchandiser and Occupational hygienist morning,   R     Signed and Held   Visual merchandiser and Held  CBC  Tomorrow morning,   R     Signed and Held   Visual merchandiser and Held  Brain  natriuretic peptide  Once,   R     Signed and Held   Signed and Held  Legionella Pneumophila Serogp 1 Ur Ag  Once,   R     Signed and Held   Signed and Held  Troponin I - Once  Once,   R     Signed and Held   Signed and Held  Procalcitonin - Baseline  ONCE - STAT,   STAT     Signed and Held   Signed and Held  Procalcitonin  Daily,   R     Signed and Held          Vitals/Pain Today's Vitals   06/08/18 1800 06/08/18 1801 06/08/18 1900 06/08/18 1918  BP: (!) 118/55 (!) 118/55 137/68 137/68  Pulse: (!) 110 (!) 111 (!) 102 (!) 102  Resp: (!) 21 19 (!) 23 (!) 22  Temp:      TempSrc:      SpO2: 90% 90% 92% 91%  PainSc:        Isolation Precautions Droplet and Enteric precautions (UV disinfection)  Medications Medications  iopamidol (ISOVUE-300) 61 % injection 100 mL (has no administration in time range)  vancomycin (VANCOCIN) 1,500 mg in sodium chloride 0.9 % 500 mL IVPB (has no administration in time range)  vancomycin (VANCOCIN) IVPB 1000 mg/200 mL premix (has no administration in time range)  sodium chloride flush (NS) 0.9 % injection 3 mL (3 mLs Intravenous Given 06/08/18 1647)  albuterol (PROVENTIL) (2.5 MG/3ML) 0.083% nebulizer solution 5 mg (5 mg Nebulization Given 06/08/18 1647)  sodium chloride 0.9 % bolus 500 mL (0 mLs Intravenous Stopped 06/08/18 1746)  cefTRIAXone (ROCEPHIN) 1 g in sodium chloride 0.9 % 100 mL IVPB (0 g Intravenous Stopped 06/08/18 1746)  azithromycin (ZITHROMAX) 500 mg in sodium chloride 0.9 % 250 mL IVPB (500 mg Intravenous New Bag/Given 06/08/18 1842)    Mobility walks with device

## 2018-06-08 NOTE — H&P (Signed)
History and Physical    [name] LNL:[id number] DOB: 1935/09/08 DOA: 06/08/2018  PCP: [name], MD Patient coming from: Home  I have personally briefly reviewed patient's old medical records in [place]  Chief Complaint: Fever, cough, myalgias, nausea, vomiting, diarrhea  HPI: [name] is a 83 y.o. female with medical history significant for severe asthma-COPD overlap syndrome (FEV1 43% predicted in 2014), chronic hypoxic respiratory failure on 2 L oxygen, rheumatoid arthritis on methotrexate and Plaquenil, mild OSA not on PAP therapy, CAD and GERD who presented to the ED with 3 days of fever, chills, headache, myalgias, arthralgias, rhinorrhea, sore throat, cough productive of whitish sputum, increased dyspnea from baseline, wheezing, nausea, nonbloody nonbilious emesis, and diarrhea.  Symptoms began Friday morning and have persisted throughout the weekend.  She reports poor appetite and poor p.o. intake due to her nausea, vomiting, diarrhea.  Her pain is non-focal and diffuse; she describes it as from the tip of her head to her feet.  States that her son-in-law also had a recent mild illness, however she cannot identify any other exposure history.  No recent travel.  She cannot pinpoint any particular dietary intake that might of brought on the symptoms.  No recent medication changes.  She is adherent with home oxygen and bronchodilator therapy.  ED Course: In the ED, patient is afebrile, mildly tachycardic, normotensive and saturating the low 90s on 3 L submental oxygen.  Labs are notable for WBC 17.7, (86% neutrophils), Hgb 12.8, platelets 344.  Renal function and electrolytes are normal.  Lactic acid 1.0 on initial check and 1.1 on repeat.  Rapid flu negative.  Chest x-ray demonstrated patchy opacities in the bases and the peripheral right upper lobe.  EKG demonstrated sinus tachycardia with a rate of 105.  She was given Rocephin, azithromycin, albuterol neb and 500 cc  normal saline bolus prior to transfer to the floor.  Review of Systems: As per HPI otherwise 10 point review of systems negative.   Past Medical History:  Diagnosis Date  . Arthritis   . Bladder neoplasm   . Chronic cough   . Chronic respiratory failure ([place])   . COPD (chronic obstructive pulmonary disease) (HCC)    PULMOLOGIST-  DR [name]--  GOLD III W/  CHRONIC RESPIRATORY FAILURE--  O2 DEPENDENT  . Coronary arteriosclerosis   . Cough productive of purulent sputum   . GERD (gastroesophageal reflux disease)   . Hematuria   . History of nephritis    as child dx w/ Bright's disease (glomerulonephritis)  . Mild obstructive sleep apnea    no cpap recommendation  . Neuropathy, peripheral, idiopathic   . Pelvic pain in female   . PONV (postoperative nausea and vomiting)   . Supplemental oxygen dependent    2L  via Nasal Canula --  activity and nighttime  . Urinary incontinence in female     Past Surgical History:  Procedure Laterality Date  . APPENDECTOMY  1971  . CARDIAC CATHETERIZATION  05-08-2007  dr Marlou Porch   minor coronary plaquing w/ no significant CAD/  20% mLAD,  10% mRCA,  perserved LV, ef 60-65%  . CATARACT EXTRACTION W/ INTRAOCULAR LENS  IMPLANT, BILATERAL  2001 approx  . CHOLECYSTECTOMY  1988  . CYSTOSCOPY W/ RETROGRADES Bilateral 09/24/2014   Procedure: CYSTOSCOPY WITH RETROGRADE PYELOGRAM;  Surgeon: Festus Aloe, MD;  Location: [facility];  Service: Urology;  Laterality: Bilateral;  . CYSTOSCOPY WITH BIOPSY N/A 09/24/2014   Procedure: CYSTOSCOPY WITH BIOPSY;  Surgeon: Festus Aloe, MD;  Location: [facility];  Service: Urology;  Laterality: N/A;  . ESOPHAGOGASTRODUODENOSCOPY  last one 09-09-2008  . FULGURATION OF BLADDER TUMOR N/A 09/24/2014   Procedure: FULGURATION OF BLADDER TUMOR;  Surgeon: Festus Aloe, MD;  Location: [facility];  Service: Urology;  Laterality: N/A;  . [place]  . POSTERIOR  LUMBAR FUSION  10-07-2007   bilateral laminectomy L4 and bilateral L4-5 diskectomy w/ fusion  . TONSILLECTOMY  as child  . TOTAL ABDOMINAL HYSTERECTOMY W/ BILATERAL SALPINGOOPHORECTOMY  1973  . TRANSTHORACIC ECHOCARDIOGRAM  07-07-2011   normal echo,  ef 65-70%     reports that she quit smoking about 6 years ago. Her smoking use included cigarettes. She has a 180.00 pack-year smoking history. She has never used smokeless tobacco. She reports that she does not drink alcohol or use drugs.  Allergies  Allergen Reactions  . Kapidex [Dexlansoprazole] Other (See Comments)    Stomach pain, diarrhea  . Zegerid [Omeprazole] Other (See Comments)    Burning in chest    Family History  Problem Relation Age of Onset  . Asthma Maternal Grandmother     Prior to Admission medications   Medication Sig Start Date End Date Taking? Authorizing Provider  acetaminophen (TYLENOL) 325 MG tablet Take 2 tablets (650 mg total) by mouth every 6 (six) hours as needed for mild pain (or Fever >/= 101). 08/02/17  Yes Emokpae, Courage, MD  albuterol (PROVENTIL HFA;VENTOLIN HFA) 108 (90 BASE) MCG/ACT inhaler Inhale 2 puffs into the lungs every 6 (six) hours as needed. For shortness of breath.   Yes [provider]  albuterol (PROVENTIL) (2.5 MG/3ML) 0.083% nebulizer solution Take 3 mLs (2.5 mg total) by nebulization every 4 (four) hours as needed for wheezing or shortness of breath. For shortness of breath. 07/28/17  Yes Emokpae, Courage, MD  aspirin EC 81 MG tablet Take 81 mg by mouth daily.   Yes [provider]  Aspirin-Caffeine (BC FAST PAIN RELIEF PO) Take 1 packet by mouth daily as needed.   Yes [provider]  atorvastatin (LIPITOR) 40 MG tablet Take 40 mg by mouth every morning.    Yes [provider]  azelastine (ASTELIN) 0.1 % nasal spray Place 1 spray into the nose 2 (two) times daily.   Yes [provider]  budesonide-formoterol (SYMBICORT) 160-4.5 MCG/ACT inhaler  Inhale 2 puffs into the lungs 2 (two) times daily. 08/02/17  Yes Emokpae, Courage, MD  Calcium Carbonate-Vitamin D (CALCIUM + D PO) Take 2 tablets by mouth daily.    Yes [provider]  cetirizine (ZYRTEC) 10 MG tablet Take 1 tablet (10 mg total) by mouth every morning. 08/02/17  Yes Emokpae, Courage, MD  Cholecalciferol (VITAMIN D) 2000 UNITS tablet Take 4,000 Units by mouth daily.    Yes [provider]  cyanocobalamin 1000 MCG tablet Take 1 tablet (1,000 mcg total) by mouth daily. 08/02/17  Yes Emokpae, Courage, MD  esomeprazole (NEXIUM) 40 MG capsule Take 1 capsule (40 mg total) by mouth every morning. 08/02/17  Yes Emokpae, Courage, MD  folic acid (FOLVITE) 1 MG tablet Take 1 tablet (1 mg total) by mouth daily. 08/02/17  Yes Emokpae, Courage, MD  gabapentin (NEURONTIN) 100 MG capsule Take 2-3 capsules (200-300 mg total) by mouth 3 (three) times daily. 200 mg every AM and at noon and 300 mg QHS 08/02/17  Yes Emokpae, Courage, MD  meloxicam (MOBIC) 15 MG tablet Take 15 mg by mouth daily. 04/24/18  Yes [provider]  methotrexate (RHEUMATREX) 2.5 MG tablet Take 15 mg by mouth once a week.  06/05/17  Yes [provider]  polyethylene glycol (MIRALAX / GLYCOLAX) packet Take 17 g by mouth daily. 08/03/17  Yes Emokpae, Courage, MD  polyvinyl alcohol (LIQUIFILM TEARS) 1.4 % ophthalmic solution Place 2 drops into both eyes as needed for dry eyes. 08/02/17  Yes Emokpae, Courage, MD  sertraline (ZOLOFT) 100 MG tablet Take 100 mg by mouth daily.   Yes [provider]  tiotropium (SPIRIVA) 18 MCG inhalation capsule Place 1 capsule (18 mcg total) into inhaler and inhale every morning. 08/02/17  Yes Emokpae, Courage, MD  vitamin A 10000 UNIT capsule Take 10,000 Units by mouth daily.   Yes [provider]  vitamin E 400 UNIT capsule Take 400 Units by mouth daily.   Yes [provider]  albuterol (PROVENTIL) (2.5 MG/3ML) 0.083% nebulizer solution Take 3 mLs  (2.5 mg total) by nebulization 3 (three) times daily. Patient not taking: Reported on 06/08/2018 08/02/17   Roxan Hockey, MD  bisacodyl (DULCOLAX) 5 MG EC tablet Take 1 tablet (5 mg total) by mouth daily as needed for moderate constipation. Patient not taking: Reported on 06/08/2018 08/02/17   Roxan Hockey, MD  carboxymethylcellulose (REFRESH PLUS) 0.5 % SOLN 2 drops 2 (two) times daily as needed (dry eye).    [provider]  feeding supplement, ENSURE ENLIVE, (ENSURE ENLIVE) LIQD Take 237 mLs by mouth 2 (two) times daily between meals. Patient not taking: Reported on 06/08/2018 08/02/17   Roxan Hockey, MD  hydroxychloroquine (PLAQUENIL) 200 MG tablet Take 200 mg by mouth daily.    [provider]  ipratropium-albuterol (DUONEB) 0.5-2.5 (3) MG/3ML SOLN Take 3 mLs by nebulization 2 (two) times daily.    [provider]  mupirocin ointment (BACTROBAN) 2 % Place 1 application into the nose 2 (two) times daily. Patient not taking: Reported on 06/08/2018 08/02/17   Roxan Hockey, MD  ondansetron (ZOFRAN) 4 MG tablet Take 1 tablet (4 mg total) by mouth every 6 (six) hours as needed for nausea. Patient not taking: Reported on 06/08/2018 08/02/17   Roxan Hockey, MD  predniSONE (DELTASONE) 20 MG tablet Take 2 tablets (40 mg total) by mouth daily with breakfast. Patient not taking: Reported on 06/08/2018 08/02/17   Roxan Hockey, MD    Physical Exam: Vitals:   06/08/18 1900 06/08/18 1918 06/08/18 2001 06/08/18 2002  BP: 137/68 137/68 (!) 154/104 (!) 154/104  Pulse: (!) 102 (!) 102 (!) 103 (!) 101  Resp: (!) 23 (!) 22 17 17   Temp:      TempSrc:      SpO2: 92% 91% 93% 93%    Constitutional: NAD, calm, comfortable Eyes: PERRL, lids and conjunctivae normal ENMT: Mucous membranes are moist. Posterior pharynx clear of any exudate or lesions. Neck: normal, supple, no masses Respiratory: Faint inspiratory wheezing in the upper lung fields, decreased breath sounds  throughout with prolonged expiratory phase, no crackles or rhonchi, no increased work of breathing Cardiovascular: Tachycardic, no murmurs / rubs / gallops. No extremity edema. 2+ pedal pulses. Abdomen: no tenderness, no masses palpated. Bowel sounds hyperactive.  Musculoskeletal: no clubbing / cyanosis. No joint deformity upper and lower extremities. Good ROM, no contractures. Normal muscle tone.  Skin: no rashes, lesions, ulcers. No induration Neurologic: CN 2-12 grossly intact. Sensation intact, DTR normal. Strength 5/5 in all 4.  Psychiatric: Normal judgment and insight. Alert and oriented x 3. Normal mood.    Labs  on Admission: I have personally reviewed following labs and imaging studies  CBC: Recent Labs  Lab 06/08/18 1548  WBC 17.7*  NEUTROABS 15.1*  HGB 12.8  HCT 40.5  MCV 95.5  PLT 035   Basic Metabolic Panel: Recent Labs  Lab 06/08/18 1548  NA 134*  K 3.9  CL 95*  CO2 25  GLUCOSE 124*  BUN 24*  CREATININE 0.91  CALCIUM 8.8*   GFR: CrCl cannot be calculated (Unknown ideal weight.). Liver Function Tests: Recent Labs  Lab 06/08/18 1548  AST 19  ALT 15  ALKPHOS 81  BILITOT 0.6  PROT 8.2*  ALBUMIN 4.0   No results for input(s): LIPASE, AMYLASE in the last 168 hours. No results for input(s): AMMONIA in the last 168 hours. Coagulation Profile: No results for input(s): INR, PROTIME in the last 168 hours. Cardiac Enzymes: No results for input(s): CKTOTAL, CKMB, CKMBINDEX, TROPONINI in the last 168 hours. BNP (last 3 results) No results for input(s): PROBNP in the last 8760 hours. HbA1C: No results for input(s): HGBA1C in the last 72 hours. CBG: No results for input(s): GLUCAP in the last 168 hours. Lipid Profile: No results for input(s): CHOL, HDL, LDLCALC, TRIG, CHOLHDL, LDLDIRECT in the last 72 hours. Thyroid Function Tests: No results for input(s): TSH, T4TOTAL, FREET4, T3FREE, THYROIDAB in the last 72 hours. Anemia Panel: No results for input(s):  VITAMINB12, FOLATE, FERRITIN, TIBC, IRON, RETICCTPCT in the last 72 hours. Urine analysis:    Component Value Date/Time   COLORURINE YELLOW 07/27/2017 1419   APPEARANCEUR CLEAR 07/27/2017 1419   LABSPEC 1.015 07/27/2017 1419   PHURINE 7.0 07/27/2017 1419   GLUCOSEU NEGATIVE 07/27/2017 1419   HGBUR NEGATIVE 07/27/2017 1419   BILIRUBINUR NEGATIVE 07/27/2017 1419   BILIRUBINUR neg 08/17/2013 1446   KETONESUR TRACE (A) 07/27/2017 1419   PROTEINUR NEGATIVE 07/27/2017 1419   UROBILINOGEN 0.2 08/17/2013 1446   UROBILINOGEN 0.2 07/06/2011 1519   NITRITE NEGATIVE 07/27/2017 1419   LEUKOCYTESUR TRACE (A) 07/27/2017 1419    Radiological Exams on Admission: Dg Chest 2 View  Result Date: 06/08/2018 CLINICAL DATA:  Cough, fever, and flu like symptoms. EXAM: CHEST - 2 VIEW COMPARISON:  03/24/2018 FINDINGS: The cardiomediastinal silhouette is unchanged with normal heart size. There is new patchy opacity laterally in the right mid lung which may be within the superior segment of the lower lobe. Additional patchy opacities are present in the lateral right lung base and lateral left mid upper lung. No pleural effusion or pneumothorax is identified. Mild thoracic levoscoliosis is noted. IMPRESSION: Patchy bilateral lung opacities concerning for pneumonia. Electronically Signed   By: Logan Bores M.D.   On: 06/08/2018 16:10    EKG: Independently reviewed.  Sinus tachycardia, rate 105.  Assessment/Plan Active Problems:   Acute on chronic respiratory failure with hypoxia (HCC)  Ms. Morimoto's symptoms are most consistent with viral illness with subsequent COPD exacerbation.  Given her chest x-ray findings with multifocal, it is prudent to treat for community-acquired pneumonia.  Will start with broad-spectrum as she is on methotrexate and hydroxychloroquine for rheumatoid arthritis and has previously tested positive for MRSA by PCR.  Sepsis 2/2 CAP with MDR risk factors COPD exacerbation Acute on chronic  hypoxic respiratory failure - Transition to cefepime, vancomycin, continue azithromycin for Legionella coverage - Obtain blood, urine, sputum cultures - Check Legionella urine Ag - Check procalcitonin - Prednisone 40 mg daily x5d - Continue LABA/LAMA/ICS per home - Duo-Nebs Q4H initially - RT eval and treat - Incentive spirometry,  mucus clearance therapy - SpO2 goal >88%; wean to 2L O2 per home  Nausea, vomiting, diarrhea - Abx as above - Check GIP, C diff - Check hepatic function panel, lipase - Supportive care, gentle IVF overnight - Anti-emetics as needed - Avoid anti-motility agents until infectious diarrhea ruled out  Chronic medical conditions - RA: continue Plaquenil, hold MTX while hospitalized - GERD: continue PPI - Peripheral neuropathy: continue gabapentin  DVT prophylaxis: Lovenox Code Status: Full Disposition Plan: Home in 2-3 days Consults called: None Admission status: Inpatient, Med-Surg   Bennie Pierini MD [facility]  If 7PM-7AM, please contact night-coverage [email or web address] Password [facility]  06/08/2018, 8:11 PM

## 2018-06-08 NOTE — ED Notes (Signed)
Hospitalist at bedside 

## 2018-06-09 ENCOUNTER — Encounter (HOSPITAL_COMMUNITY): Payer: Self-pay | Admitting: *Deleted

## 2018-06-09 ENCOUNTER — Other Ambulatory Visit: Payer: Self-pay

## 2018-06-09 DIAGNOSIS — J441 Chronic obstructive pulmonary disease with (acute) exacerbation: Secondary | ICD-10-CM

## 2018-06-09 DIAGNOSIS — J189 Pneumonia, unspecified organism: Secondary | ICD-10-CM

## 2018-06-09 LAB — CBC
HCT: 36.5 % (ref 36.0–46.0)
Hemoglobin: 11.2 g/dL — ABNORMAL LOW (ref 12.0–15.0)
MCH: 30.3 pg (ref 26.0–34.0)
MCHC: 30.7 g/dL (ref 30.0–36.0)
MCV: 98.6 fL (ref 80.0–100.0)
Platelets: 322 10*3/uL (ref 150–400)
RBC: 3.7 MIL/uL — ABNORMAL LOW (ref 3.87–5.11)
RDW: 12.5 % (ref 11.5–15.5)
WBC: 12 10*3/uL — ABNORMAL HIGH (ref 4.0–10.5)
nRBC: 0 % (ref 0.0–0.2)

## 2018-06-09 LAB — RESPIRATORY PANEL BY PCR
Adenovirus: NOT DETECTED
Bordetella pertussis: NOT DETECTED
CORONAVIRUS NL63-RVPPCR: NOT DETECTED
Chlamydophila pneumoniae: NOT DETECTED
Coronavirus 229E: NOT DETECTED
Coronavirus HKU1: NOT DETECTED
Coronavirus OC43: NOT DETECTED
INFLUENZA A-RVPPCR: NOT DETECTED
Influenza B: NOT DETECTED
MYCOPLASMA PNEUMONIAE-RVPPCR: NOT DETECTED
Metapneumovirus: NOT DETECTED
Parainfluenza Virus 1: NOT DETECTED
Parainfluenza Virus 2: NOT DETECTED
Parainfluenza Virus 3: NOT DETECTED
Parainfluenza Virus 4: NOT DETECTED
Respiratory Syncytial Virus: NOT DETECTED
Rhinovirus / Enterovirus: NOT DETECTED

## 2018-06-09 LAB — BASIC METABOLIC PANEL
Anion gap: 10 (ref 5–15)
BUN: 16 mg/dL (ref 8–23)
CO2: 27 mmol/L (ref 22–32)
CREATININE: 0.75 mg/dL (ref 0.44–1.00)
Calcium: 8.4 mg/dL — ABNORMAL LOW (ref 8.9–10.3)
Chloride: 101 mmol/L (ref 98–111)
GFR calc non Af Amer: >60 mL/min (ref >60)
Glucose, Bld: 175 mg/dL — ABNORMAL HIGH (ref 70–99)
Potassium: 4.4 mmol/L (ref 3.5–5.1)
Sodium: 138 mmol/L (ref 135–145)

## 2018-06-09 LAB — PROCALCITONIN: Procalcitonin: <0.1 ng/mL

## 2018-06-09 LAB — STREP PNEUMONIAE URINARY ANTIGEN: Strep Pneumo Urinary Antigen: NEGATIVE

## 2018-06-09 LAB — GLUCOSE, CAPILLARY: Glucose-Capillary: 136 mg/dL — ABNORMAL HIGH (ref 70–99)

## 2018-06-09 LAB — MRSA PCR SCREENING: MRSA by PCR: POSITIVE — AB

## 2018-06-09 LAB — TROPONIN I: Troponin I: <0.03 ng/mL (ref <0.03)

## 2018-06-09 MED ORDER — IPRATROPIUM-ALBUTEROL 0.5-2.5 (3) MG/3ML IN SOLN
3.0000 mL | Freq: Four times a day (QID) | RESPIRATORY_TRACT | Status: DC
  Administered 2018-06-09: 3 mL via RESPIRATORY_TRACT
  Filled 2018-06-09 (×2): qty 3

## 2018-06-09 MED ORDER — IPRATROPIUM-ALBUTEROL 0.5-2.5 (3) MG/3ML IN SOLN
3.0000 mL | Freq: Three times a day (TID) | RESPIRATORY_TRACT | Status: DC
  Administered 2018-06-09 – 2018-06-10 (×3): 3 mL via RESPIRATORY_TRACT
  Filled 2018-06-09 (×3): qty 3

## 2018-06-09 MED ORDER — SODIUM CHLORIDE 0.9 % IV SOLN
INTRAVENOUS | Status: DC | PRN
  Administered 2018-06-09: 500 mL via INTRAVENOUS

## 2018-06-09 MED ORDER — MUPIROCIN 2 % EX OINT
1.0000 "application " | TOPICAL_OINTMENT | Freq: Two times a day (BID) | CUTANEOUS | Status: DC
  Administered 2018-06-09 – 2018-06-10 (×3): 1 via NASAL
  Filled 2018-06-09: qty 22

## 2018-06-09 MED ORDER — ALBUTEROL SULFATE (2.5 MG/3ML) 0.083% IN NEBU: 2.5000 mg | INHALATION_SOLUTION | RESPIRATORY_TRACT | Status: DC | PRN

## 2018-06-09 MED ORDER — CHLORHEXIDINE GLUCONATE CLOTH 2 % EX PADS: 6.0000 | MEDICATED_PAD | Freq: Every day | CUTANEOUS | Status: DC

## 2018-06-09 MED ORDER — ORAL CARE MOUTH RINSE
15.0000 mL | Freq: Two times a day (BID) | OROMUCOSAL | Status: DC
  Administered 2018-06-09 – 2018-06-10 (×3): 15 mL via OROMUCOSAL

## 2018-06-09 MED ORDER — DIPHENHYDRAMINE HCL 25 MG PO CAPS
25.0000 mg | ORAL_CAPSULE | Freq: Every evening | ORAL | Status: DC | PRN
  Administered 2018-06-09: 25 mg via ORAL
  Filled 2018-06-09: qty 1

## 2018-06-09 MED ORDER — DIPHENHYDRAMINE HCL 25 MG PO CAPS
25.0000 mg | ORAL_CAPSULE | Freq: Four times a day (QID) | ORAL | Status: AC | PRN
  Administered 2018-06-09: 25 mg via ORAL
  Filled 2018-06-09: qty 1

## 2018-06-09 NOTE — Evaluation (Signed)
Physical Therapy Evaluation Patient Details Name: [name] MRN: [id number] DOB: [birth year] Today's Date: 06/09/2018   History of Present Illness  83 yo female admitted to ED on 2/16 with acute respiratory failure secondary to COPD exacerbation and PNA. Pt on 2-3LO2 chronically at home. Other PMH includes GERD, RA, HTN, bladder neoplasm with removal of bladder tumor, chronic respiratory failure, hematuria, OSA, neuropathy, HLD, lumbar fusion, cataracts extraction.   Clinical Impression   Pt presents with LE weakness, increased time and effort to perform mobility tasks, dyspnea on exertion with desats <90% on 2LO2 via [place], impaired standing balance especially dynamically, and decreased endurance for activity. Pt to benefit from acute PT to address deficits. Pt ambulated 3x12 ft during eval today with min guard assist, requiring rest breaks due to dyspnea and fatigue. Given pt deficits, pt recommending OPPT to address LE strength, endurance, and balance upon d/c but pt states "I don't feel physical therapy has been very helpful (in the past)". PT to progress mobility as tolerated, and will continue to follow acutely.      Follow Up Recommendations Outpatient PT;Supervision for mobility/OOB    Equipment Recommendations  None recommended by PT    Recommendations for Other Services       Precautions / Restrictions Precautions Precautions: Fall Precaution Comments: on 2LO2 Restrictions Weight Bearing Restrictions: No      Mobility  Bed Mobility Overal bed mobility: Needs Assistance             General bed mobility comments: Pt up in chair upon PT arrival, and left up in chair upon PT exit   Transfers Overall transfer level: Needs assistance Equipment used: None Transfers: Sit to/from Stand Sit to Stand: Min assist;Min guard;From elevated surface         General transfer comment: Sit to stand x7 during session, to perform 5X sit to stand and 2 sit to stands after rest  breaks due to pt fatigue. Min guard to min assist for power up and steadying as needed. Pt with use of UEs to push up to standing  Ambulation/Gait Ambulation/Gait assistance: Min guard Gait Distance (Feet): 12 Feet(3x12 ft) Assistive device: None Gait Pattern/deviations: Step-through pattern;Decreased stride length;Shuffle;Wide base of support Gait velocity: decr    General Gait Details: Min guard for safety. Pt with slow, shuffling gait with mild unsteadiness noted throughout, pt-corrected. Pt with increased time and steps to make turns, wide BOS and hip ER for stability.   Stairs            Wheelchair Mobility    Modified Rankin (Stroke Patients Only)       Balance Overall balance assessment: Needs assistance;History of Falls(pt reports "several falls" in the past year ) Sitting-balance support: No upper extremity supported Sitting balance-Leahy Scale: Good     Standing balance support: No upper extremity supported;During functional activity Standing balance-Leahy Scale: Fair Standing balance comment: not able to accept challenge, pt with multiple periods of unsteadiness self-recovered                High Level Balance Comments: 5X sit to stand 22 seconds with use of UEs to power up (>15 seconds recurrent fall risk per [employer], [place] 2010)              Pertinent Vitals/Pain Pain Assessment: Faces Faces Pain Scale: Hurts little more Pain Location: "my tailbone, from laying so much" Pain Descriptors / Indicators: Sore Pain Intervention(s): Limited activity within patient's tolerance;Repositioned;Monitored during session  Home Living Family/patient expects to be discharged to:: Private residence Living Arrangements: Other relatives Available Help at Discharge: Family Type of Home: House Home Access: Stairs to enter Entrance Stairs-Rails: None Entrance Stairs-Number of Steps: 1 Home Layout: Two level Home Equipment: Clinical cytogeneticist  - 4 wheels;Wheelchair - power Additional Comments: has 2 rollators, one for house and one for car for community ambulation. Pt lives with 69 year old granddaughter and son-in-law.     Prior Function Level of Independence: Needs assistance   Gait / Transfers Assistance Needed: Pt uses rollator for community ambulation, occasionally uses AD in home   ADL's / Homemaking Assistance Needed: Pt getting help with cleaning her room and bathroom from granddaughter and other relative. Pt drives.       Hand Dominance   Dominant Hand: Right    Extremity/Trunk Assessment   Upper Extremity Assessment Upper Extremity Assessment: Overall WFL for tasks assessed    Lower Extremity Assessment Lower Extremity Assessment: Generalized weakness;RLE deficits/detail;LLE deficits/detail RLE Sensation: history of peripheral neuropathy LLE Sensation: history of peripheral neuropathy    Cervical / Trunk Assessment Cervical / Trunk Assessment: Normal  Communication   Communication: No difficulties  Cognition Arousal/Alertness: Awake/alert Behavior During Therapy: Anxious Overall Cognitive Status: Within Functional Limits for tasks assessed                                 General Comments: Pt with UE trembling and constant rocking back and forth during session       General Comments General comments (skin integrity, edema, etc.): Pt removed O2 during ambulation to and from bathroom, sats dropped to 83%. Pt placed back on 2LO2, recovered sats >90% with pursed lip breathing technique. Sats on 2LO2 during ambulation and immediately after 87-96%.     Exercises     Assessment/Plan    PT Assessment Patient needs continued PT services  PT Problem List Decreased strength;Pain;Impaired sensation       PT Treatment Interventions DME instruction;Therapeutic activities;Gait training;Therapeutic exercise;Patient/family education;Stair training;Balance training;Functional mobility training     PT Goals (Current goals can be found in the Care Plan section)  Acute Rehab PT Goals Patient Stated Goal: none stated  PT Goal Formulation: With patient Time For Goal Achievement: 06/23/18 Potential to Achieve Goals: Good    Frequency Min 3X/week   Barriers to discharge        Co-evaluation               AM-PAC PT "6 Clicks" Mobility  Outcome Measure Help needed turning from your back to your side while in a flat bed without using bedrails?: A Little Help needed moving from lying on your back to sitting on the side of a flat bed without using bedrails?: A Little Help needed moving to and from a bed to a chair (including a wheelchair)?: A Little Help needed standing up from a chair using your arms (e.g., wheelchair or bedside chair)?: A Little Help needed to walk in hospital room?: A Little Help needed climbing 3-5 steps with a railing? : A Little 6 Click Score: 18    End of Session Equipment Utilized During Treatment: Gait belt;Oxygen Activity Tolerance: Patient tolerated treatment well;Patient limited by fatigue Patient left: in chair;with call bell/phone within reach(Pt states she will call for assist from RN/NT when she wants to get back to bed ) Nurse Communication: Mobility status PT Visit Diagnosis: Unsteadiness on feet (R26.81);Difficulty in walking, not  elsewhere classified (R26.2)    Time: 7800-4471 PT Time Calculation (min) (ACUTE ONLY): 32 min   Charges:   PT Evaluation $PT Eval Low Complexity: 1 Low PT Treatments $Gait Training: 8-22 mins        Julien Girt, PT Acute Rehabilitation Services Pager [phone]  Office [phone]   Roxine Caddy D Elonda Husky 06/09/2018, 12:53 PM

## 2018-06-09 NOTE — Care Management Note (Signed)
Case Management Note  Patient Details  Name: [name] MRN: [id number] Date of Birth: 08-May-1935  Subjective/Objective: Acute on chronic resp failure w/hypoxia. From home. PT recc outpatient PT-MD to privde manual script w/dx. CM will provide listing of outpatient locations as a resource.                   Action/Plan:dc plan home.   Expected Discharge Date:  06/13/18               Expected Discharge Plan:  Home/Self Care  In-House Referral:     Discharge planning Services  CM Consult  Post Acute Care Choice:    Choice offered to:     DME Arranged:    DME Agency:     HH Arranged:    HH Agency:     Status of Service:  In process, will continue to follow  If discussed at Long Length of Stay Meetings, dates discussed:    Additional Comments:  Dessa Phi, RN 06/09/2018, 1:31 PM

## 2018-06-09 NOTE — Progress Notes (Signed)
RT in to see pt. for scheduled aerosol treatment, RN made aware that pt. was given Benadryl after asking for something to help her sleep, RT assessed ptatients medication history and ordered prn Albuterol every 4 hours as needed.

## 2018-06-09 NOTE — Progress Notes (Signed)
PROGRESS NOTE    [name]  JXB:[id number] DOB: [birth year] DOA: 06/08/2018 PCP: [name], MD   Brief Narrative: 83 y.o. female with medical history significant for severe asthma-COPD overlap syndrome (FEV1 43% predicted in 2014), chronic hypoxic respiratory failure on 2 L oxygen, rheumatoid arthritis on methotrexate and Plaquenil, mild OSA not on PAP therapy, CAD and GERD who presented to the ED with 3 days of fever, chills, headache, myalgias, arthralgias, rhinorrhea, sore throat, cough productive of whitish sputum, increased dyspnea from baseline, wheezing, nausea, nonbloody nonbilious emesis, and diarrhea.  Symptoms began Friday morning and have persisted throughout the weekend.  She reports poor appetite and poor p.o. intake due to her nausea, vomiting, diarrhea.  Her pain is non-focal and diffuse; she describes it as from the tip of her head to her feet.  States that her son-in-law also had a recent mild illness, however she cannot identify any other exposure history.  No recent travel.  She cannot pinpoint any particular dietary intake that might of brought on the symptoms.  No recent medication changes.  She is adherent with home oxygen and bronchodilator therapy.  ED Course: In the ED, patient is afebrile, mildly tachycardic, normotensive and saturating the low 90s on 3 L submental oxygen.  Labs are notable for WBC 17.7, (86% neutrophils), Hgb 12.8, platelets 344.  Renal function and electrolytes are normal.  Lactic acid 1.0 on initial check and 1.1 on repeat.  Rapid flu negative.  Chest x-ray demonstrated patchy opacities in the bases and the peripheral right upper lobe.  EKG demonstrated sinus tachycardia with a rate of 105.  She was given Rocephin, azithromycin, albuterol neb and 500 cc normal saline bolus prior to transfer to the floor.  Assessment & Plan:   Active Problems:   Acute on chronic respiratory failure with hypoxia (HCC)  #1 acute on chronic hypoxic respiratory  failure secondary to COPD exacerbation.  Patient has oxygen at home she uses 2 to 3 L of oxygen at home daily.  Patient currently on Vanco cefepime and azithromycin.  She has had MRSA PCR positive in the past.  Will send MRSA PCR.  Negative DC vancomycin.  Send urine Legionella.  Follow-up blood urine and sputum cultures.  Continue prednisone duo nebs.  Procalcitonin less than 0.10 lactic acid 1.1.  Leukocytosis improving.  Does patchy bilateral lung opacities concerning for pneumonia.  #2 nausea vomiting and diarrhea seems to be resolving  #3 rheumatoid arthritis continue Plaquenil hold methotrexate while in hospital  #4 history of peripheral neuropathy continue gabapentin  #5 GERD continue PPI   Estimated body mass index is 36.51 kg/m as calculated from the following:   Height as of this encounter: 5' (1.524 m).   Weight as of this encounter: 84.8 kg.  DVT prophylaxis: Lovenox  code Status full code  family Communication: No family available Disposition Plan: Pending clinical improvement Consultants:   None  Procedures: None Antimicrobials: Vancomycin cefepime and azithromycin  Subjective: She reports nausea and wheezing and shortness of breath  Objective: Vitals:   06/08/18 2055 06/08/18 2058 06/09/18 0102 06/09/18 0643  BP: 108/62  135/66 (!) 142/76  Pulse: (!) 102  87 86  Resp: 18  18 20   Temp: 98.6 F (37 C)  98.2 F (36.8 C) 98.3 F (36.8 C)  TempSrc: Oral  Oral Oral  SpO2: 95% 94% 94% 93%  Weight:   84.8 kg   Height:   5' (1.524 m)     Intake/Output Summary (Last 24 hours)  at 06/09/2018 0734 Last data filed at 06/09/2018 0655 Gross per 24 hour  Intake 2144.62 ml  Output 500 ml  Net 1644.62 ml   Filed Weights   06/09/18 0102  Weight: 84.8 kg    Examination:  General exam: Appears calm and comfortable  Respiratory system: Diffuse wheezing to auscultation. Respiratory effort normal. Cardiovascular system: S1 & S2 heard, RRR. No JVD, murmurs, rubs,  gallops or clicks. No pedal edema. Gastrointestinal system: Abdomen is nondistended, soft and nontender. No organomegaly or masses felt. Normal bowel sounds heard. Central nervous system: Alert and oriented. No focal neurological deficits. Extremities: Symmetric 5 x 5 power. Skin: No rashes, lesions or ulcers Psychiatry: Judgement and insight appear normal. Mood & affect appropriate.     Data Reviewed: I have personally reviewed following labs and imaging studies  CBC: Recent Labs  Lab 06/08/18 1548 06/09/18 0459  WBC 17.7* 12.0*  NEUTROABS 15.1*  --   HGB 12.8 11.2*  HCT 40.5 36.5  MCV 95.5 98.6  PLT 344 546   Basic Metabolic Panel: Recent Labs  Lab 06/08/18 1548 06/08/18 2023 06/09/18 0459  NA 134*  --  138  K 3.9  --  4.4  CL 95*  --  101  CO2 25  --  27  GLUCOSE 124*  --  175*  BUN 24*  --  16  CREATININE 0.91  --  0.75  CALCIUM 8.8*  --  8.4*  MG  --  1.9  --   PHOS  --  2.9  --    GFR: Estimated Creatinine Clearance: 54.2 mL/min (by C-G formula based on SCr of 0.75 mg/dL). Liver Function Tests: Recent Labs  Lab 06/08/18 1548 06/08/18 2023  AST 19 19  ALT 15 14  ALKPHOS 81 78  BILITOT 0.6 0.7  PROT 8.2* 8.2*  ALBUMIN 4.0 4.2   Recent Labs  Lab 06/08/18 2023  LIPASE 49   No results for input(s): AMMONIA in the last 168 hours. Coagulation Profile: No results for input(s): INR, PROTIME in the last 168 hours. Cardiac Enzymes: Recent Labs  Lab 06/09/18 0459  TROPONINI <0.03   BNP (last 3 results) No results for input(s): PROBNP in the last 8760 hours. HbA1C: No results for input(s): HGBA1C in the last 72 hours. CBG: No results for input(s): GLUCAP in the last 168 hours. Lipid Profile: No results for input(s): CHOL, HDL, LDLCALC, TRIG, CHOLHDL, LDLDIRECT in the last 72 hours. Thyroid Function Tests: No results for input(s): TSH, T4TOTAL, FREET4, T3FREE, THYROIDAB in the last 72 hours. Anemia Panel: No results for input(s): VITAMINB12,  FOLATE, FERRITIN, TIBC, IRON, RETICCTPCT in the last 72 hours. Sepsis Labs: Recent Labs  Lab 06/08/18 1548 06/08/18 1752 06/08/18 2023 06/09/18 0459  PROCALCITON  --   --  <0.10 <0.10  LATICACIDVEN 1.0 1.1  --   --     No results found for this or any previous visit (from the past 240 hour(s)).       Radiology Studies: Dg Chest 2 View  Result Date: 06/08/2018 CLINICAL DATA:  Cough, fever, and flu like symptoms. EXAM: CHEST - 2 VIEW COMPARISON:  03/24/2018 FINDINGS: The cardiomediastinal silhouette is unchanged with normal heart size. There is new patchy opacity laterally in the right mid lung which may be within the superior segment of the lower lobe. Additional patchy opacities are present in the lateral right lung base and lateral left mid upper lung. No pleural effusion or pneumothorax is identified. Mild thoracic levoscoliosis is noted. IMPRESSION:  Patchy bilateral lung opacities concerning for pneumonia. Electronically Signed   By: Logan Bores M.D.   On: 06/08/2018 16:10        Scheduled Meds: . aspirin EC  81 mg Oral Daily  . atorvastatin  40 mg Oral q morning - 10a  . azithromycin  250 mg Oral Daily  . calcium-vitamin D  2 tablet Oral Q breakfast  . docusate sodium  100 mg Oral BID  . enoxaparin (LOVENOX) injection  40 mg Subcutaneous Q24H  . folic acid  1 mg Oral Daily  . gabapentin  200 mg Oral 2 times per day   And  . gabapentin  300 mg Oral QHS  . hydroxychloroquine  200 mg Oral Daily  . ipratropium-albuterol  3 mL Nebulization Q6H  . mouth rinse  15 mL Mouth Rinse BID  . mometasone-formoterol  2 puff Inhalation BID  . pantoprazole  40 mg Oral Daily  . predniSONE  40 mg Oral Q breakfast  . senna  1 tablet Oral BID  . sertraline  100 mg Oral Daily  . umeclidinium bromide  1 puff Inhalation Daily  . cyanocobalamin  1,000 mcg Oral Daily   Continuous Infusions: . ceFEPime (MAXIPIME) IV 1 g (06/09/18 0655)  . vancomycin       LOS: 1 day       Georgette Shell, MD [facility]  If 7PM-7AM, please contact night-coverage [email or web address] Password [facility] 06/09/2018, 7:34 AM

## 2018-06-09 NOTE — Progress Notes (Signed)
CRITICAL VALUE ALERT  Critical Value:  Positive MRSA pcr  Date & Time Notied:  06/09/2018   Provider Notified: 1:00 PM   Orders Received/Actions taken: mrsa standing orders initiated

## 2018-06-10 LAB — BASIC METABOLIC PANEL
ANION GAP: 9 (ref 5–15)
BUN: 14 mg/dL (ref 8–23)
CO2: 24 mmol/L (ref 22–32)
Calcium: 8.4 mg/dL — ABNORMAL LOW (ref 8.9–10.3)
Chloride: 104 mmol/L (ref 98–111)
Creatinine, Ser: 0.67 mg/dL (ref 0.44–1.00)
GFR calc Af Amer: >60 mL/min (ref >60)
GFR calc non Af Amer: >60 mL/min (ref >60)
Glucose, Bld: 101 mg/dL — ABNORMAL HIGH (ref 70–99)
Potassium: 3.3 mmol/L — ABNORMAL LOW (ref 3.5–5.1)
Sodium: 137 mmol/L (ref 135–145)

## 2018-06-10 LAB — GASTROINTESTINAL PANEL BY PCR, STOOL (REPLACES STOOL CULTURE)

## 2018-06-10 LAB — URINE CULTURE: Culture: <10000 — AB

## 2018-06-10 LAB — CBC
HCT: 34.6 % — ABNORMAL LOW (ref 36.0–46.0)
HEMOGLOBIN: 10.5 g/dL — AB (ref 12.0–15.0)
MCH: 30.1 pg (ref 26.0–34.0)
MCHC: 30.3 g/dL (ref 30.0–36.0)
MCV: 99.1 fL (ref 80.0–100.0)
Platelets: 304 10*3/uL (ref 150–400)
RBC: 3.49 MIL/uL — ABNORMAL LOW (ref 3.87–5.11)
RDW: 12.5 % (ref 11.5–15.5)
WBC: 10.7 10*3/uL — ABNORMAL HIGH (ref 4.0–10.5)
nRBC: 0 % (ref 0.0–0.2)

## 2018-06-10 LAB — GLUCOSE, CAPILLARY: Glucose-Capillary: 91 mg/dL (ref 70–99)

## 2018-06-10 LAB — LEGIONELLA PNEUMOPHILA SEROGP 1 UR AG: L. pneumophila Serogp 1 Ur Ag: NEGATIVE

## 2018-06-10 LAB — PROCALCITONIN: Procalcitonin: <0.1 ng/mL

## 2018-06-10 MED ORDER — PREDNISONE 10 MG PO TABS: ORAL_TABLET | ORAL | 0 refills | Status: DC

## 2018-06-10 MED ORDER — CALCIUM CARBONATE-VITAMIN D 500-200 MG-UNIT PO TABS: 2.0000 | ORAL_TABLET | Freq: Every day | ORAL | Status: DC

## 2018-06-10 MED ORDER — DOXYCYCLINE HYCLATE 100 MG PO TABS
100.0000 mg | ORAL_TABLET | Freq: Two times a day (BID) | ORAL | Status: DC
  Administered 2018-06-10: 100 mg via ORAL
  Filled 2018-06-10: qty 1

## 2018-06-10 MED ORDER — POTASSIUM CHLORIDE CRYS ER 20 MEQ PO TBCR
40.0000 meq | EXTENDED_RELEASE_TABLET | Freq: Once | ORAL | Status: AC
  Administered 2018-06-10: 40 meq via ORAL
  Filled 2018-06-10: qty 2

## 2018-06-10 MED ORDER — MUPIROCIN 2 % EX OINT: 1.0000 "application " | TOPICAL_OINTMENT | Freq: Two times a day (BID) | CUTANEOUS | 0 refills | Status: DC

## 2018-06-10 MED ORDER — CEFDINIR 300 MG PO CAPS: 300.0000 mg | ORAL_CAPSULE | Freq: Two times a day (BID) | ORAL | 0 refills | Status: DC

## 2018-06-10 MED ORDER — DOXYCYCLINE HYCLATE 100 MG PO TABS: 100.0000 mg | ORAL_TABLET | Freq: Two times a day (BID) | ORAL | 0 refills | Status: DC

## 2018-06-10 NOTE — Care Management Note (Signed)
Case Management Note  Patient Details  Name: [name] MRN: [id number] Date of Birth: Oct 02, 1935  Subjective/Objective: Patient recc for otpt PT-provided w/otpt PT listing-patient will contact the otpt PT facility of choice.MD provided w/script.Patient states her sister will pick her up(coming directly from work-doesn't have travel home 02 tank) @ d/c-patient will need home 02 travel tank in rm-[place] spoke to [place] rep-he will have home 02 travel tank brought to rm prior to d/c(may take a few hours). No further CM needs.                   Action/Plan:d/c home.   Expected Discharge Date:  06/10/18               Expected Discharge Plan:  OP Rehab  In-House Referral:     Discharge planning Services  CM Consult  Post Acute Care Choice:  Durable Medical Equipment(Active w/[employer] home 02 travel tank,neb machine) Choice offered to:     DME Arranged:    DME Agency:     HH Arranged:    HH Agency:     Status of Service:  Completed, signed off  If discussed at [employer], dates discussed:    Additional Comments:  Dessa Phi, RN 06/10/2018, 1:18 PM

## 2018-06-10 NOTE — Discharge Summary (Signed)
Physician Discharge Summary  DELAYNE SANZO DEY:[id number] DOB: 10/03/1935 DOA: 06/08/2018  PCP: [name], MD  Admit date: 06/08/2018 Discharge date: 06/10/2018  Admitted From: Home Disposition: Home Recommendations for Outpatient Follow-up:  1. Follow up with PCP in 1-2 weeks 2. Please obtain BMP/CBC in one week:  Home Health: Yes  equipment/Devices oxygen Discharge Condition: Stable  CODE STATUS full code Diet recommendation: Cardiac  Brief/Interim Summary:83 y.o.femalewith medical history significant forsevere asthma-COPD overlap syndrome (FEV1 43% predicted in 2014), chronic hypoxic respiratory failure on 2 L oxygen, rheumatoid arthritis on methotrexate and Plaquenil, mild OSA not on PAPtherapy, CAD and GERD who presented to the ED with 3 days of fever, chills, headache, myalgias, arthralgias, rhinorrhea, sore throat,cough productive of whitish sputum, increased dyspnea from baseline, wheezing, nausea, nonbloody nonbilious emesis, and diarrhea. Symptoms began Friday morning and have persisted throughout the weekend. She reports poor appetite and poor p.o. intake due to her nausea, vomiting, diarrhea. Her pain is non-focalanddiffuse;she describes it as from the tip of her head to her feet. States that her son-in-law also had a recent mild illness, however she cannot identify any other exposure history. No recent travel. She cannot pinpoint any particular dietary intake that might of brought on the symptoms. No recent medication changes. She is adherent with home oxygen and bronchodilator therapy.  ED Course:In the ED, patient is afebrile, mildly tachycardic, normotensive and saturating the low 90s on 3 L submental oxygen. Labs are notable for WBC 17.7, (86% neutrophils), Hgb 12.8, platelets 344. Renal function and electrolytes are normal. Lactic acid 1.0 on initial check and 1.1 on repeat. Rapid flu negative. Chest x-ray demonstrated patchy opacities in the bases  and the peripheral right upper lobe. EKG demonstrated sinus tachycardia with a rate of 105. She was given Rocephin, azithromycin, albuterol neb and 500 cc normal saline bolus prior to transfer to the floor.   Discharge Diagnoses:  Active Problems:   Acute on chronic respiratory failure with hypoxia (HCC)   Community acquired pneumonia   #1 acute on chronic hypoxic respiratory failure secondary to COPD exacerbation.  She was treated with steroids nebulizers and antibiotics.  She is also found to have pneumonia.  MRSA PCR was positive.  She will be discharged home today on doxycycline and Omnicef.  She feels she is back to baseline.  She is currently on 2 L of oxygen.  Physical therapy worked with her recommended home health PT.  She has no further nausea vomiting or diarrhea.  Will discharge her on prednisone taper.   #2 nausea vomiting and diarrhea resolved  #3 rheumatoid arthritis continue Plaquenil and methotrexate  #4 history of peripheral neuropathy continue gabapentin  #5 GERD continue PPI  Estimated body mass index is 37.93 kg/m as calculated from the following:   Height as of this encounter: 5' (1.524 m).   Weight as of this encounter: 88.1 kg.  Discharge Instructions  Discharge Instructions    Call MD for:  difficulty breathing, headache or visual disturbances   Complete by:  As directed    Call MD for:  persistant nausea and vomiting   Complete by:  As directed    Call MD for:  severe uncontrolled pain   Complete by:  As directed    Call MD for:  temperature >100.4   Complete by:  As directed    Diet - low sodium heart healthy   Complete by:  As directed    Increase activity slowly   Complete by:  As directed  Allergies as of 06/10/2018      Reactions   Kapidex [dexlansoprazole] Other (See Comments)   Stomach pain, diarrhea   Zegerid [omeprazole] Other (See Comments)   Burning in chest      Medication List    STOP taking these medications   BC  FAST PAIN RELIEF PO   bisacodyl 5 MG EC tablet Commonly known as:  DULCOLAX   feeding supplement (ENSURE ENLIVE) Liqd   ondansetron 4 MG tablet Commonly known as:  ZOFRAN   predniSONE 20 MG tablet Commonly known as:  DELTASONE   vitamin E 400 UNIT capsule     TAKE these medications   acetaminophen 325 MG tablet Commonly known as:  TYLENOL Take 2 tablets (650 mg total) by mouth every 6 (six) hours as needed for mild pain (or Fever >/= 101).   albuterol 108 (90 Base) MCG/ACT inhaler Commonly known as:  PROVENTIL HFA;VENTOLIN HFA Inhale 2 puffs into the lungs every 6 (six) hours as needed. For shortness of breath. What changed:  Another medication with the same name was removed. Continue taking this medication, and follow the directions you see here.   albuterol (2.5 MG/3ML) 0.083% nebulizer solution Commonly known as:  PROVENTIL Take 3 mLs (2.5 mg total) by nebulization every 4 (four) hours as needed for wheezing or shortness of breath. For shortness of breath. What changed:  Another medication with the same name was removed. Continue taking this medication, and follow the directions you see here.   aspirin EC 81 MG tablet Take 81 mg by mouth daily.   atorvastatin 40 MG tablet Commonly known as:  LIPITOR Take 40 mg by mouth every morning.   azelastine 0.1 % nasal spray Commonly known as:  ASTELIN Place 1 spray into the nose 2 (two) times daily.   budesonide-formoterol 160-4.5 MCG/ACT inhaler Commonly known as:  SYMBICORT Inhale 2 puffs into the lungs 2 (two) times daily.   CALCIUM + D PO Take 2 tablets by mouth daily.   calcium-vitamin D 500-200 MG-UNIT tablet Commonly known as:  OSCAL WITH D Take 2 tablets by mouth daily with breakfast. Start taking on:  June 11, 2018   carboxymethylcellulose 0.5 % Soln Commonly known as:  REFRESH PLUS 2 drops 2 (two) times daily as needed (dry eye).   cefdinir 300 MG capsule Commonly known as:  OMNICEF Take 1 capsule  (300 mg total) by mouth 2 (two) times daily.   cetirizine 10 MG tablet Commonly known as:  ZYRTEC Take 1 tablet (10 mg total) by mouth every morning.   cyanocobalamin 1000 MCG tablet Take 1 tablet (1,000 mcg total) by mouth daily.   doxycycline 100 MG tablet Commonly known as:  VIBRA-TABS Take 1 tablet (100 mg total) by mouth every 12 (twelve) hours.   esomeprazole 40 MG capsule Commonly known as:  NEXIUM Take 1 capsule (40 mg total) by mouth every morning.   folic acid 1 MG tablet Commonly known as:  FOLVITE Take 1 tablet (1 mg total) by mouth daily.   gabapentin 100 MG capsule Commonly known as:  NEURONTIN Take 2-3 capsules (200-300 mg total) by mouth 3 (three) times daily. 200 mg every AM and at noon and 300 mg QHS   hydroxychloroquine 200 MG tablet Commonly known as:  PLAQUENIL Take 200 mg by mouth daily.   ipratropium-albuterol 0.5-2.5 (3) MG/3ML Soln Commonly known as:  DUONEB Take 3 mLs by nebulization 2 (two) times daily.   meloxicam 15 MG tablet Commonly known as:  MOBIC  Take 15 mg by mouth daily.   methotrexate 2.5 MG tablet Commonly known as:  RHEUMATREX Take 15 mg by mouth once a week.   mupirocin ointment 2 % Commonly known as:  BACTROBAN Place 1 application into the nose 2 (two) times daily.   polyethylene glycol packet Commonly known as:  MIRALAX / GLYCOLAX Take 17 g by mouth daily.   polyvinyl alcohol 1.4 % ophthalmic solution Commonly known as:  LIQUIFILM TEARS Place 2 drops into both eyes as needed for dry eyes.   sertraline 100 MG tablet Commonly known as:  ZOLOFT Take 100 mg by mouth daily.   tiotropium 18 MCG inhalation capsule Commonly known as:  SPIRIVA Place 1 capsule (18 mcg total) into inhaler and inhale every morning.   vitamin A 10000 UNIT capsule Take 10,000 Units by mouth daily.   Vitamin D 50 MCG (2000 UT) tablet Take 4,000 Units by mouth daily.      Follow-up Information    [name], MD Follow up.    Specialty:  Family Medicine Contact information: [place] [address] [phone]          Allergies  Allergen Reactions  . Kapidex [Dexlansoprazole] Other (See Comments)    Stomach pain, diarrhea  . Zegerid [Omeprazole] Other (See Comments)    Burning in chest    Consultations: None   Procedures/Studies: Dg Chest 2 View  Result Date: 06/08/2018 CLINICAL DATA:  Cough, fever, and flu like symptoms. EXAM: CHEST - 2 VIEW COMPARISON:  03/24/2018 FINDINGS: The cardiomediastinal silhouette is unchanged with normal heart size. There is new patchy opacity laterally in the right mid lung which may be within the superior segment of the lower lobe. Additional patchy opacities are present in the lateral right lung base and lateral left mid upper lung. No pleural effusion or pneumothorax is identified. Mild thoracic levoscoliosis is noted. IMPRESSION: Patchy bilateral lung opacities concerning for pneumonia. Electronically Signed   By: Logan Bores M.D.   On: 06/08/2018 16:10   Dg Bone Density (dxa)  Result Date: 05/26/2018 EXAM: DUAL X-RAY ABSORPTIOMETRY (DXA) FOR BONE MINERAL DENSITY IMPRESSION: Referring Physician:  [name] W Your patient completed a BMD test using Lunar IDXA DXA system ( analysis version: 16 ) manufactured by [employer]. Technologist: KT PATIENT: Name: JOCLYN, ALSOBROOK Patient ID: [id number] Birth Date: [birth year] Height: 60.0 in. Sex: Female Measured: 05/26/2018 Weight: 183.4 lbs. Indications: Advanced Age, Albuterol, Bilateral Ovariectomy (65.51), Caucasian, Depression, Estrogen Deficient, Gabapentin, Glucocorticoids (Chronic) (255.41), Height Loss (781.91), History of Fracture (Adult) (V15.51), History of Osteopenia, Hysterectomy, Methotrexate, Nexium, Postmenopausal, Rheumatoid Arthritis (714.0), Secondary Osteoporosis, Zoloft Fractures: coccyx, Foot, Rib, sternum Treatments: Calcium (E943.0), Vitamin D (E933.5) ASSESSMENT: The BMD measured at  Femur Neck Left is 0.754 g/cm2 with a T-score of -2.0. This patient is considered OSTEOPENIC according to [facility]) criteria. There has been no statistically significant change in BMD of left hip since prior exam dated 02/25/2008. DXA exam performed on prior Hologic device measured only unilateral hip (Total Mean was not measured) and therefore current or prior Total Mean cannot be compared. The scan quality is good. L-3 and L-4 were excluded due to degenerative changes and surgical hardware. Spine was not compared to prior study due to exclusion of vertebral bodies on current exam. Site Region Measured Date Measured Age YA T-score BMD Significant CHANGE DualFemur Neck Left  05/26/2018    80.5         -2.0    0.754 g/cm2 DualFemur  Neck Left  02/25/2008    70.2         -1.8    0.787 g/cm2 AP Spine  L1-L2      05/26/2018    80.5         -1.4    0.997 g/cm2 DualFemur Total Mean 05/26/2018    80.5         -0.7    0.925 g/cm2 World Health Organization [facility]) criteria for post-menopausal, Caucasian Women: Normal       T-score at or above -1 SD Osteopenia   T-score between -1 and -2.5 SD Osteoporosis T-score at or below -2.5 SD RECOMMENDATION: 1. All patients should optimize calcium and vitamin D intake. 2. Consider FDA approved medical therapies in postmenopausal women and men aged 79 years and older, based on the following: a. A hip or vertebral (clinical or morphometric) fracture b. T- score < or = -2.5 at the femoral neck or spine after appropriate evaluation to exclude secondary causes c. Low bone mass (T-score between -1.0 and -2.5 at the femoral neck or spine) and a 10 year probability of a hip fracture > or = 3% or a 10 year probability of a major osteoporosis-related fracture > or = 20% based on the [place]-adapted WHO algorithm d. Clinician judgment and/or patient preferences may indicate treatment for people with 10-year fracture probabilities above or below these levels FOLLOW-UP: People with  diagnosed cases of osteoporosis or at high risk for fracture should have regular bone mineral density tests. For patients eligible for Medicare, routine testing is allowed once every 2 years. The testing frequency can be increased to one year for patients who have rapidly progressing disease, those who are receiving or discontinuing medical therapy to restore bone mass, or have additional risk factors. I have reviewed this report and agree with the above findings. [facility] FRAX* 10-year Probability of Fracture Based on femoral neck BMD: DualFemur (Left) Major Osteoporotic Fracture: 38.1% Hip Fracture:                13.2% Population:                  [place] (Caucasian) Risk Factors: Glucocorticoids (Chronic) (255.41), History of Fracture (Adult) (V15.51), Rheumatoid Arthritis (714.0), Secondary Osteoporosis *FRAX is a Materials engineer of the [employer] for Metabolic Bone Disease, a World Pharmacologist (WHO) [facility]. ASSESSMENT: The probability of a major osteoporotic fracture is 38.1 % within the next ten years. The probability of hip fracture is 13.2  % within the next 10 years. I have reviewed this report and agree with the above findings. Mark A. Thornton Papas, M.D. [facility] Electronically Signed   By: Lavonia Dana M.D.   On: 05/26/2018 10:35   (Echo, Carotid, EGD, Colonoscopy, ERCP)    Subjective:   Discharge Exam: Vitals:   06/10/18 0533 06/10/18 0806  BP: (!) 142/59   Pulse: 78   Resp: 16   Temp: 98.4 F (36.9 C)   SpO2: 95% 95%   Vitals:   06/09/18 2232 06/10/18 0533 06/10/18 0600 06/10/18 0806  BP: (!) 164/70 (!) 142/59    Pulse: 71 78    Resp: 19 16    Temp: 97.9 F (36.6 C) 98.4 F (36.9 C)    TempSrc: Oral Oral    SpO2: 98% 95%  95%  Weight:   88.1 kg   Height:        General: Pt is alert, awake, not  in acute distress Cardiovascular: RRR, S1/S2 +, no rubs, no gallops Respiratory: CTA  bilaterally, no wheezing, no rhonchi Abdominal: Soft, NT, ND, bowel sounds + Extremities: no edema, no cyanosis    The results of significant diagnostics from this hospitalization (including imaging, microbiology, ancillary and laboratory) are listed below for reference.     Microbiology: Recent Results (from the past 240 hour(s))  Urine Culture     Status: Abnormal   Collection Time: 06/08/18  4:28 PM  Result Value Ref Range Status   Specimen Description   Final    URINE, CLEAN CATCH Performed at [facility], [place] [address] [place], [place] [address]    Special Requests   Final    NONE Performed at [facility], [place] [address] [place], [place] [address]    Culture (A)  Final    <10,000 COLONIES/mL INSIGNIFICANT GROWTH Performed at [place] [address] [place], [place] [address]    Report Status 06/10/2018 FINAL  Final  Blood Culture (routine x 2)     Status: None (Preliminary result)   Collection Time: 06/08/18  4:34 PM  Result Value Ref Range Status   Specimen Description   Final    BLOOD RIGHT WRIST Performed at [place] [address] [place], [place] [address]    Special Requests   Final    BOTTLES DRAWN AEROBIC AND ANAEROBIC Blood Culture adequate volume Performed at [place] [address], [place], [place] [address]    Culture   Final    NO GROWTH < 24 HOURS Performed at [place] [address] [place], [place] [address]    Report Status PENDING  Incomplete  Blood Culture (routine x 2)     Status: None (Preliminary result)   Collection Time: 06/08/18  4:39 PM  Result Value Ref Range Status   Specimen Description   Final    BLOOD BLOOD LEFT FOREARM Performed at [place] [address], [place], [place] [address]    Special Requests   Final    BOTTLES DRAWN AEROBIC AND ANAEROBIC Blood Culture adequate volume Performed at  [place] [address] [place], [place] [address]    Culture   Final    NO GROWTH < 24 HOURS Performed at [place] [address] [place], [place] [address]    Report Status PENDING  Incomplete  Respiratory Panel by PCR     Status: None   Collection Time: 06/09/18 10:25 AM  Result Value Ref Range Status   Adenovirus NOT DETECTED NOT DETECTED Final   Coronavirus 229E NOT DETECTED NOT DETECTED Final    Comment: (NOTE) The Coronavirus on the Respiratory Panel, DOES NOT test for the novel  Coronavirus (2019 nCoV)    Coronavirus HKU1 NOT DETECTED NOT DETECTED Final   Coronavirus NL63 NOT DETECTED NOT DETECTED Final   Coronavirus OC43 NOT DETECTED NOT DETECTED Final   Metapneumovirus NOT DETECTED NOT DETECTED Final   Rhinovirus / Enterovirus NOT DETECTED NOT DETECTED Final   Influenza A NOT DETECTED NOT DETECTED Final   Influenza B NOT DETECTED NOT DETECTED Final   Parainfluenza Virus 1 NOT DETECTED NOT DETECTED Final   Parainfluenza Virus 2 NOT DETECTED NOT DETECTED Final   Parainfluenza Virus 3 NOT DETECTED NOT DETECTED Final   Parainfluenza Virus 4 NOT DETECTED NOT DETECTED Final   Respiratory Syncytial Virus NOT DETECTED NOT DETECTED Final  Bordetella pertussis NOT DETECTED NOT DETECTED Final   Chlamydophila pneumoniae NOT DETECTED NOT DETECTED Final   Mycoplasma pneumoniae NOT DETECTED NOT DETECTED Final    Comment: Performed at [facility], [place] [address], [place], [place] [address]  MRSA PCR Screening     Status: Abnormal   Collection Time: 06/09/18 10:25 AM  Result Value Ref Range Status   MRSA by PCR POSITIVE (A) NEGATIVE Final    Comment:        The GeneXpert MRSA Assay (FDA approved for NASAL specimens only), is one component of a comprehensive MRSA colonization surveillance program. It is not intended to diagnose MRSA infection nor to guide or monitor treatment for MRSA infections. RESULT CALLED TO, READ BACK BY AND  VERIFIED WITH: TORRES,D. RN AT 1255 06/09/18 MULLINS,T Performed at [facility], [place] [address], [place],  [address]      Labs: BNP (last 3 results) Recent Labs    07/29/17 1142 06/08/18 2022  BNP 237.7* 24.5   Basic Metabolic Panel: Recent Labs  Lab 06/08/18 1548 06/08/18 2023 06/09/18 0459 06/10/18 0345  NA 134*  --  138 137  K 3.9  --  4.4 3.3*  CL 95*  --  101 104  CO2 25  --  27 24  GLUCOSE 124*  --  175* 101*  BUN 24*  --  16 14  CREATININE 0.91  --  0.75 0.67  CALCIUM 8.8*  --  8.4* 8.4*  MG  --  1.9  --   --   PHOS  --  2.9  --   --    Liver Function Tests: Recent Labs  Lab 06/08/18 1548 06/08/18 2023  AST 19 19  ALT 15 14  ALKPHOS 81 78  BILITOT 0.6 0.7  PROT 8.2* 8.2*  ALBUMIN 4.0 4.2   Recent Labs  Lab 06/08/18 2023  LIPASE 49   No results for input(s): AMMONIA in the last 168 hours. CBC: Recent Labs  Lab 06/08/18 1548 06/09/18 0459 06/10/18 0345  WBC 17.7* 12.0* 10.7*  NEUTROABS 15.1*  --   --   HGB 12.8 11.2* 10.5*  HCT 40.5 36.5 34.6*  MCV 95.5 98.6 99.1  PLT 344 322 304   Cardiac Enzymes: Recent Labs  Lab 06/09/18 0459  TROPONINI <0.03   BNP: Invalid input(s): POCBNP CBG: Recent Labs  Lab 06/09/18 0746 06/10/18 0729  GLUCAP 136* 91   D-Dimer No results for input(s): DDIMER in the last 72 hours. Hgb A1c No results for input(s): HGBA1C in the last 72 hours. Lipid Profile No results for input(s): CHOL, HDL, LDLCALC, TRIG, CHOLHDL, LDLDIRECT in the last 72 hours. Thyroid function studies No results for input(s): TSH, T4TOTAL, T3FREE, THYROIDAB in the last 72 hours.  Invalid input(s): FREET3 Anemia work up No results for input(s): VITAMINB12, FOLATE, FERRITIN, TIBC, IRON, RETICCTPCT in the last 72 hours. Urinalysis    Component Value Date/Time   COLORURINE YELLOW 06/08/2018 1521   APPEARANCEUR CLEAR 06/08/2018 1521   LABSPEC 1.025 06/08/2018 1521   PHURINE 5.0 06/08/2018 1521    GLUCOSEU NEGATIVE 06/08/2018 1521   HGBUR NEGATIVE 06/08/2018 1521   BILIRUBINUR NEGATIVE 06/08/2018 1521   BILIRUBINUR neg 08/17/2013 1446   KETONESUR 20 (A) 06/08/2018 1521   PROTEINUR 30 (A) 06/08/2018 1521   UROBILINOGEN 0.2 08/17/2013 1446   UROBILINOGEN 0.2 07/06/2011 1519   NITRITE NEGATIVE 06/08/2018 1521   LEUKOCYTESUR SMALL (A) 06/08/2018 1521   Sepsis Labs Invalid input(s): PROCALCITONIN,  WBC,  [facility] Recent Results (from the past 240 hour(s))  Urine Culture     Status: Abnormal   Collection Time: 06/08/18  4:28 PM  Result Value Ref Range Status   Specimen Description   Final    URINE, CLEAN CATCH Performed at [facility], [place] [address] [place], [place] [address]    Special Requests   Final    NONE Performed at [facility], [place] [address], [place], [place] [address]    Culture (A)  Final    <10,000 COLONIES/mL INSIGNIFICANT GROWTH Performed at [place] [address], [place], [place] [address]    Report Status 06/10/2018 FINAL  Final  Blood Culture (routine x 2)     Status: None (Preliminary result)   Collection Time: 06/08/18  4:34 PM  Result Value Ref Range Status   Specimen Description   Final    BLOOD RIGHT WRIST Performed at [place] [address] [place], [place] [address]    Special Requests   Final    BOTTLES DRAWN AEROBIC AND ANAEROBIC Blood Culture adequate volume Performed at [place] [address] [place], [place] [address]    Culture   Final    NO GROWTH < 24 HOURS Performed at [place] [address] [place], [place] [address]    Report Status PENDING  Incomplete  Blood Culture (routine x 2)     Status: None (Preliminary result)   Collection Time: 06/08/18  4:39 PM  Result Value Ref Range Status   Specimen Description   Final    BLOOD BLOOD LEFT FOREARM Performed at [place]  [address], [place], [place] [address]    Special Requests   Final    BOTTLES DRAWN AEROBIC AND ANAEROBIC Blood Culture adequate volume Performed at [place] [address], [place], [place] [address]    Culture   Final    NO GROWTH < 24 HOURS Performed at [place] [address] [place], [place] [address]    Report Status PENDING  Incomplete  Respiratory Panel by PCR     Status: None   Collection Time: 06/09/18 10:25 AM  Result Value Ref Range Status   Adenovirus NOT DETECTED NOT DETECTED Final   Coronavirus 229E NOT DETECTED NOT DETECTED Final    Comment: (NOTE) The Coronavirus on the Respiratory Panel, DOES NOT test for the novel  Coronavirus (2019 nCoV)    Coronavirus HKU1 NOT DETECTED NOT DETECTED Final   Coronavirus NL63 NOT DETECTED NOT DETECTED Final   Coronavirus OC43 NOT DETECTED NOT DETECTED Final   Metapneumovirus NOT DETECTED NOT DETECTED Final   Rhinovirus / Enterovirus NOT DETECTED NOT DETECTED Final   Influenza A NOT DETECTED NOT DETECTED Final   Influenza B NOT DETECTED NOT DETECTED Final   Parainfluenza Virus 1 NOT DETECTED NOT DETECTED Final   Parainfluenza Virus 2 NOT DETECTED NOT DETECTED Final   Parainfluenza Virus 3 NOT DETECTED NOT DETECTED Final   Parainfluenza Virus 4 NOT DETECTED NOT DETECTED Final   Respiratory Syncytial Virus NOT DETECTED NOT DETECTED Final   Bordetella pertussis NOT DETECTED NOT DETECTED Final   Chlamydophila pneumoniae NOT DETECTED NOT DETECTED Final   Mycoplasma pneumoniae NOT DETECTED NOT DETECTED Final    Comment: Performed at [facility], [address] [place],  [address]  MRSA PCR Screening     Status: Abnormal   Collection  Time: 06/09/18 10:25 AM  Result Value Ref Range Status   MRSA by PCR POSITIVE (A) NEGATIVE Final    Comment:        The GeneXpert MRSA Assay (FDA approved for NASAL specimens only), is one component of a comprehensive MRSA colonization surveillance  program. It is not intended to diagnose MRSA infection nor to guide or monitor treatment for MRSA infections. RESULT CALLED TO, READ BACK BY AND VERIFIED WITH: TORRES,D. RN AT 1255 06/09/18 MULLINS,T Performed at [facility], [place] [address], [place], [place] [address]      Time coordinating discharge:39 minutes  SIGNED:   Georgette Shell, MD  [facility] 06/10/2018, 9:47 AM Pager   If 7PM-7AM, please contact night-coverage [email or web address] Password [email or web address]

## 2018-06-11 LAB — RSV(RESPIRATORY SYNCYTIAL VIRUS) AB, BLOOD: RSV Ab: NEGATIVE

## 2018-06-13 LAB — CULTURE, BLOOD (ROUTINE X 2)
CULTURE: NO GROWTH
Culture: NO GROWTH
Special Requests: ADEQUATE
Special Requests: ADEQUATE

## 2018-06-16 ENCOUNTER — Ambulatory Visit: Payer: Medicare Other

## 2018-06-16 DIAGNOSIS — R350 Frequency of micturition: Secondary | ICD-10-CM | POA: Diagnosis not present

## 2018-06-16 DIAGNOSIS — R351 Nocturia: Secondary | ICD-10-CM | POA: Diagnosis not present

## 2018-06-17 DIAGNOSIS — E876 Hypokalemia: Secondary | ICD-10-CM | POA: Diagnosis not present

## 2018-06-17 DIAGNOSIS — M48061 Spinal stenosis, lumbar region without neurogenic claudication: Secondary | ICD-10-CM | POA: Diagnosis not present

## 2018-06-17 DIAGNOSIS — J441 Chronic obstructive pulmonary disease with (acute) exacerbation: Secondary | ICD-10-CM | POA: Diagnosis not present

## 2018-06-17 DIAGNOSIS — M159 Polyosteoarthritis, unspecified: Secondary | ICD-10-CM | POA: Diagnosis not present

## 2018-06-17 DIAGNOSIS — D649 Anemia, unspecified: Secondary | ICD-10-CM | POA: Diagnosis not present

## 2018-06-17 DIAGNOSIS — R42 Dizziness and giddiness: Secondary | ICD-10-CM | POA: Diagnosis not present

## 2018-06-17 DIAGNOSIS — J189 Pneumonia, unspecified organism: Secondary | ICD-10-CM | POA: Diagnosis not present

## 2018-06-17 DIAGNOSIS — R296 Repeated falls: Secondary | ICD-10-CM | POA: Diagnosis not present

## 2018-06-17 DIAGNOSIS — F3340 Major depressive disorder, recurrent, in remission, unspecified: Secondary | ICD-10-CM | POA: Diagnosis not present

## 2018-06-17 DIAGNOSIS — E538 Deficiency of other specified B group vitamins: Secondary | ICD-10-CM | POA: Diagnosis not present

## 2018-06-17 DIAGNOSIS — M0579 Rheumatoid arthritis with rheumatoid factor of multiple sites without organ or systems involvement: Secondary | ICD-10-CM | POA: Diagnosis not present

## 2018-06-17 DIAGNOSIS — R05 Cough: Secondary | ICD-10-CM | POA: Diagnosis not present

## 2018-06-17 DIAGNOSIS — J449 Chronic obstructive pulmonary disease, unspecified: Secondary | ICD-10-CM | POA: Diagnosis not present

## 2018-06-27 DIAGNOSIS — E782 Mixed hyperlipidemia: Secondary | ICD-10-CM | POA: Diagnosis not present

## 2018-06-27 DIAGNOSIS — F3341 Major depressive disorder, recurrent, in partial remission: Secondary | ICD-10-CM | POA: Diagnosis not present

## 2018-06-27 DIAGNOSIS — D509 Iron deficiency anemia, unspecified: Secondary | ICD-10-CM | POA: Diagnosis not present

## 2018-06-27 DIAGNOSIS — M8580 Other specified disorders of bone density and structure, unspecified site: Secondary | ICD-10-CM | POA: Diagnosis not present

## 2018-06-27 DIAGNOSIS — H9191 Unspecified hearing loss, right ear: Secondary | ICD-10-CM | POA: Diagnosis not present

## 2018-06-27 DIAGNOSIS — R7303 Prediabetes: Secondary | ICD-10-CM | POA: Diagnosis not present

## 2018-06-27 DIAGNOSIS — M48061 Spinal stenosis, lumbar region without neurogenic claudication: Secondary | ICD-10-CM | POA: Diagnosis not present

## 2018-06-27 DIAGNOSIS — J449 Chronic obstructive pulmonary disease, unspecified: Secondary | ICD-10-CM | POA: Diagnosis not present

## 2018-06-27 DIAGNOSIS — Z6835 Body mass index (BMI) 35.0-35.9, adult: Secondary | ICD-10-CM | POA: Diagnosis not present

## 2018-06-27 DIAGNOSIS — M2550 Pain in unspecified joint: Secondary | ICD-10-CM | POA: Diagnosis not present

## 2018-06-27 DIAGNOSIS — M0579 Rheumatoid arthritis with rheumatoid factor of multiple sites without organ or systems involvement: Secondary | ICD-10-CM | POA: Diagnosis not present

## 2018-06-27 DIAGNOSIS — R296 Repeated falls: Secondary | ICD-10-CM | POA: Diagnosis not present

## 2018-06-30 ENCOUNTER — Ambulatory Visit
Admission: RE | Admit: 2018-06-30 | Discharge: 2018-06-30 | Disposition: A | Discharge status: Home / Self Care | Payer: Medicare Other | Source: Ambulatory Visit | Attending: Family Medicine | Admitting: Family Medicine

## 2018-06-30 DIAGNOSIS — Z1231 Encounter for screening mammogram for malignant neoplasm of breast: Secondary | ICD-10-CM | POA: Diagnosis not present

## 2018-07-02 DIAGNOSIS — H524 Presbyopia: Secondary | ICD-10-CM | POA: Diagnosis not present

## 2018-07-02 DIAGNOSIS — H52203 Unspecified astigmatism, bilateral: Secondary | ICD-10-CM | POA: Diagnosis not present

## 2018-07-02 DIAGNOSIS — H26491 Other secondary cataract, right eye: Secondary | ICD-10-CM | POA: Diagnosis not present

## 2018-07-04 ENCOUNTER — Other Ambulatory Visit: Payer: Self-pay

## 2018-07-04 ENCOUNTER — Encounter: Payer: Self-pay | Admitting: Physical Therapy

## 2018-07-04 ENCOUNTER — Ambulatory Visit (INDEPENDENT_AMBULATORY_CARE_PROVIDER_SITE_OTHER): Payer: Medicare Other | Admitting: Physical Therapy

## 2018-07-04 DIAGNOSIS — R42 Dizziness and giddiness: Secondary | ICD-10-CM

## 2018-07-04 DIAGNOSIS — Z9181 History of falling: Secondary | ICD-10-CM

## 2018-07-04 DIAGNOSIS — M6281 Muscle weakness (generalized): Secondary | ICD-10-CM

## 2018-07-04 DIAGNOSIS — R2681 Unsteadiness on feet: Secondary | ICD-10-CM

## 2018-07-04 NOTE — Therapy (Signed)
[name], [place], [address] Phone: [phone]   Fax:  [phone]  Physical Therapy Evaluation  Patient Details  Name: [name] MRN: [id number] Date of Birth: 1935/08/25 Referring Provider (PT): [name], MD   Encounter Date: 07/04/2018  PT End of Session - 07/04/18 1223    Visit Number  1    Number of Visits  12    Date for PT Re-Evaluation  08/15/18    PT Start Time  1030    PT Stop Time  1110    PT Time Calculation (min)  40 min    Activity Tolerance  Patient tolerated treatment well;Patient limited by fatigue    Behavior During Therapy  [facility] for tasks assessed/performed       Past Medical History:  Diagnosis Date  . Arthritis   . Bladder neoplasm   . Chronic cough   . Chronic respiratory failure ([place])   . COPD (chronic obstructive pulmonary disease) (HCC)    PULMOLOGIST-  DR [name]--  GOLD III W/  CHRONIC RESPIRATORY FAILURE--  O2 DEPENDENT  . Coronary arteriosclerosis   . Cough productive of purulent sputum   . GERD (gastroesophageal reflux disease)   . Hematuria   . History of nephritis    as child dx w/ Bright's disease (glomerulonephritis)  . Mild obstructive sleep apnea    no cpap recommendation  . Neuropathy, peripheral, idiopathic   . Pelvic pain in female   . PONV (postoperative nausea and vomiting)   . Supplemental oxygen dependent    2L  via Nasal Canula --  activity and nighttime  . Urinary incontinence in female     Past Surgical History:  Procedure Laterality Date  . APPENDECTOMY  1971  . CARDIAC CATHETERIZATION  05-08-2007  dr Marlou Porch   minor coronary plaquing w/ no significant CAD/  20% mLAD,  10% mRCA,  perserved LV, ef 60-65%  . CATARACT EXTRACTION W/ INTRAOCULAR LENS  IMPLANT, BILATERAL  2001 approx  . CHOLECYSTECTOMY  1988  . CYSTOSCOPY W/ RETROGRADES Bilateral 09/24/2014   Procedure: CYSTOSCOPY WITH RETROGRADE PYELOGRAM;  Surgeon: Festus Aloe, MD;  Location: [facility];  Service: Urology;  Laterality: Bilateral;  . CYSTOSCOPY WITH BIOPSY N/A 09/24/2014   Procedure: CYSTOSCOPY WITH BIOPSY;  Surgeon: Festus Aloe, MD;  Location: [facility];  Service: Urology;  Laterality: N/A;  . ESOPHAGOGASTRODUODENOSCOPY  last one 09-09-2008  . FULGURATION OF BLADDER TUMOR N/A 09/24/2014   Procedure: FULGURATION OF BLADDER TUMOR;  Surgeon: Festus Aloe, MD;  Location: [facility];  Service: Urology;  Laterality: N/A;  . [place]  . POSTERIOR LUMBAR FUSION  10-07-2007   bilateral laminectomy L4 and bilateral L4-5 diskectomy w/ fusion  . TONSILLECTOMY  as child  . TOTAL ABDOMINAL HYSTERECTOMY W/ BILATERAL SALPINGOOPHORECTOMY  1973  . TRANSTHORACIC ECHOCARDIOGRAM  07-07-2011   normal echo,  ef 65-70%    There were no vitals filed for this visit.   Subjective Assessment - 07/04/18 1031    Subjective  Pt is an 83 y/o female who presents to OPPT s/p inpatient hospitalization for PNA in Feb 2020.  Recommended then for pt to come to OPPT, and pt is unsure why she is here.  Pt told PCP "I'm not going to come."  Pt is reluctant to participate in PT at this time and unsure why she needs to be here.    Patient Stated Goals  "If I knew  why I was here."      Currently in Pain?  No/denies         [facility] PT Assessment - 07/04/18 1035      Assessment   Medical Diagnosis  frequent falls, deconditioning    Referring Provider (PT)  [name], MD    Onset Date/Surgical Date  --   unsure; chronic   Hand Dominance  Right    Next MD Visit  07/27/2018    Prior Therapy  multiple episodes      Precautions   Precautions  Fall      Restrictions   Weight Bearing Restrictions  No      Balance Screen   Has the patient fallen in the past 6 months  Yes    How many times?  several; multiple near falls: on avg 1x/wk true falls    Has the patient had a decrease in activity level because  of a fear of falling?   No    Is the patient reluctant to leave their home because of a fear of falling?   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 [employer]  no regular exercise      Cognition   Overall Cognitive Status  Within Functional Limits for tasks assessed      Posture/Postural Control   Posture/Postural Control  Postural limitations    Postural Limitations  Rounded Shoulders;Forward head;Increased thoracic kyphosis      ROM / Strength   AROM / PROM / Strength  Strength      Strength   Overall Strength Comments  tested in sitting    Strength Assessment Site  Hip;Knee;Ankle    Right/Left Hip  Right;Left    Right Hip Flexion  3/5    Left Hip Flexion  3/5    Right/Left Knee  Right;Left    Right Knee Flexion  4/5    Right Knee Extension  4/5    Left Knee Flexion  4/5    Left Knee Extension  4/5    Right/Left Ankle  Right;Left    Right Ankle Dorsiflexion  5/5    Left Ankle Dorsiflexion  5/5      Ambulation/Gait   Ambulation/Gait  Yes    Ambulation/Gait Assistance  5: Supervision    Ambulation Distance (Feet)  150 Feet    Assistive device  None    Gait Pattern  Decreased stance time - right;Decreased step length - left;Decreased hip/knee flexion - right;Lateral trunk lean to right      Standardized Balance Assessment   Standardized Balance Assessment  Timed Up and Go Test;Dynamic Gait Index      Dynamic Gait Index   Level Surface  Mild Impairment    Change in Gait Speed  Mild Impairment    Gait with Horizontal Head Turns  Moderate Impairment    Gait with Vertical Head Turns  Moderate Impairment    Gait and Pivot  Turn  Mild Impairment    Step Over Obstacle  Mild  Impairment    Step Around Obstacles  Normal    Steps  Mild Impairment    Total Score  15      Timed Up and Go Test   Normal TUG (seconds)  11.51           Vestibular Assessment - 07/04/18 1043      Vestibular Assessment   General Observation  no symptoms at rest      Symptom Behavior   Subjective history of current problem  had symptoms 3 days ago    Type of Dizziness   Lightheadedness   tired   Frequency of Dizziness  variable; inconsistent    Duration of Dizziness  quickly resolves    Symptom Nature  Spontaneous    Aggravating Factors  Spontaneous onset    Relieving Factors  --   sitting down   Progression of Symptoms  No change since onset      Oculomotor Exam   Oculomotor Alignment  Normal    Spontaneous  Absent    Gaze-induced   Absent    Smooth Pursuits  Intact    Saccades  Intact      Oculomotor Exam-Fixation Suppressed    Left Head Impulse  (-)    Right Head Impulse  (+)      Vestibulo-Ocular Reflex   VOR 1 Head Only (x 1 viewing)  WNL with increase in symptoms      Auditory   Comments  seated cervical rotation with extension: increase in symptoms with looking to Lt; negative to Rt      Positional Testing   Sidelying Test  Sidelying Right;Sidelying Left    Horizontal Canal Testing  --      Sidelying Right   Sidelying Right Duration  5 sec    Sidelying Right Symptoms  No nystagmus;Other (comment)   c/o lightheadedness     Sidelying Left   Sidelying Left Duration  5 sec    Sidelying Left Symptoms  No nystagmus;Other (comment)   c/o spinning         Objective measurements completed on examination: See above findings.       Vestibular Treatment/Exercise - 07/04/18 1222      Vestibular Treatment/Exercise   Vestibular Treatment Provided  Canalith Repositioning    Canalith Repositioning  Epley Manuever Left       EPLEY MANUEVER LEFT   Number of Reps   1    Overall Response   Improved  Symptoms            PT Education - 07/04/18 1223    Education Details  BPPV, goals of PT, POC    Person(s) Educated  Patient    Methods  Explanation;Demonstration    Comprehension  Verbalized understanding          PT Long Term Goals - 07/04/18 1227      PT LONG TERM GOAL #1   Title  verbalize understanding of fall prevention strategies to decrease fall risk    Status  New    Target Date  08/15/18      PT LONG TERM GOAL #2   Title  independent with HEP    Status  New    Target Date  08/15/18      PT LONG TERM GOAL #3   Title  improve dynamic gait index to >/= 20/24 for decreased fall risk    Status  New    Target Date  08/15/18      PT LONG TERM  GOAL #4   Title  amb > 500' on various indoor/outdoor surfaces independently for improved mobility and community independence    Status  New    Target Date  08/15/18             Plan - 07/04/18 1224    Clinical Impression Statement  Pt is an 83 y/o female who presents to OPPT for decreased balance and falls, as well as reports of vertigo.  Pt demonstrates decreased strength and balance as well as positive vestibular testing.  Pt will benefit from PT to address deficits listed.    Personal Factors and Comorbidities  Comorbidity 3+    Comorbidities  COPD (O2 dependent), peripheral neuropathy, vertigo, obesity    Examination-Activity Limitations  Locomotion Level;Stairs;Stand    Examination-Participation Restrictions  Volunteer    Stability/Clinical Decision Making  Unstable/Unpredictable    Clinical Decision Making  High    Rehab Potential  Good    PT Frequency  2x / week    PT Duration  6 weeks    PT Treatment/Interventions  ADLs/Self Care Home Management;Cryotherapy;Canalith Repostioning;Moist Heat;Electrical Stimulation;Gait training;Stair training;Functional mobility training;Neuromuscular re-education;Balance training;Therapeutic exercise;Therapeutic activities;Patient/family education;Vestibular    PT Next  Visit Plan  complete vestibular assessment, balance exercises, reassess vertigo    Consulted and Agree with Plan of Care  Patient       Patient will benefit from skilled therapeutic intervention in order to improve the following deficits and impairments:  Abnormal gait, Dizziness, Postural dysfunction, Decreased mobility, Decreased endurance, Decreased strength, Difficulty walking, Decreased balance  Visit Diagnosis: Unsteadiness on feet - Plan: PT plan of care cert/re-cert  Muscle weakness (generalized) - Plan: PT plan of care cert/re-cert  History of falling - Plan: PT plan of care cert/re-cert  Dizziness and giddiness - Plan: PT plan of care cert/re-cert     Problem List Patient Active Problem List   Diagnosis Date Noted  . Community acquired pneumonia   . Acute on chronic respiratory failure with hypoxia ([place]) 06/08/2018  . Acute respiratory failure with hypercapnia ([place])   . COPD exacerbation ([place]) 07/29/2017  . Rheumatoid arthritis ([place]) 07/29/2017  . Neuropathy 07/29/2017  . Acute respiratory failure with hypoxia ([place]) 07/29/2017  . Anemia 05/20/2016  . Hyperglycemia 05/20/2016  . Shoulder impingement syndrome 03/25/2015  . Chronic respiratory failure ([place]) 04/10/2012  . Constipation 07/03/2011  . COPD with acute exacerbation ([place]) 06/29/2011  . Stage 3 severe COPD by GOLD classification ([place]) 06/19/2007  . HLD (hyperlipidemia) 05/15/2007  . ACID REFLUX DISEASE 05/15/2007      Laureen Abrahams, PT, DPT 07/04/18 12:31 PM    [facility] [place], [place], [address] Phone: [phone]   Fax:  [phone]  Name: CLOVER FEEHAN MRN: [id number] Date of Birth: 09-17-1935

## 2018-07-09 ENCOUNTER — Ambulatory Visit (INDEPENDENT_AMBULATORY_CARE_PROVIDER_SITE_OTHER): Payer: Medicare Other | Admitting: Physical Therapy

## 2018-07-09 ENCOUNTER — Encounter: Payer: Self-pay | Admitting: Physical Therapy

## 2018-07-09 ENCOUNTER — Other Ambulatory Visit: Payer: Self-pay

## 2018-07-09 DIAGNOSIS — M6281 Muscle weakness (generalized): Secondary | ICD-10-CM

## 2018-07-09 DIAGNOSIS — R2681 Unsteadiness on feet: Secondary | ICD-10-CM

## 2018-07-09 DIAGNOSIS — Z9181 History of falling: Secondary | ICD-10-CM

## 2018-07-09 DIAGNOSIS — R42 Dizziness and giddiness: Secondary | ICD-10-CM | POA: Diagnosis not present

## 2018-07-09 NOTE — Patient Instructions (Signed)
Access Code: [email or web address]  URL: [email or web address]  Date: 07/09/2018  Prepared by: Faustino Congress   Exercises  Seated March - 10 reps - 1 sets - 3 sec hold - 1x daily - 7x weekly  Seated Long Arc Quad - 10 reps - 1 sets - 3 sec hold - 1x daily - 7x weekly  Sit to Stand - 10 reps - 1 sets - 1x daily - 7x weekly  Heel rises with counter support - 10 reps - 1 sets - 1x daily - 7x weekly

## 2018-07-09 NOTE — Therapy (Addendum)
[place], [place], [address] Phone: [phone]   Fax:  [phone]  Physical Therapy Treatment/Discharge  Patient Details  Name: [name] MRN: [id number] Date of Birth: [birth year] Referring Provider (PT): [name], MD   Encounter Date: 07/09/2018  PT End of Session - 07/09/18 1601    Visit Number  2    Number of Visits  12    Date for PT Re-Evaluation  08/15/18    PT Start Time  4431    PT Stop Time  1555    PT Time Calculation (min)  40 min    Activity Tolerance  Patient tolerated treatment well;Patient limited by fatigue    Behavior During Therapy  [facility] for tasks assessed/performed       Past Medical History:  Diagnosis Date  . Arthritis   . Bladder neoplasm   . Chronic cough   . Chronic respiratory failure ([place])   . COPD (chronic obstructive pulmonary disease) (HCC)    PULMOLOGIST-  DR [name]--  GOLD III W/  CHRONIC RESPIRATORY FAILURE--  O2 DEPENDENT  . Coronary arteriosclerosis   . Cough productive of purulent sputum   . GERD (gastroesophageal reflux disease)   . Hematuria   . History of nephritis    as child dx w/ Bright's disease (glomerulonephritis)  . Mild obstructive sleep apnea    no cpap recommendation  . Neuropathy, peripheral, idiopathic   . Pelvic pain in female   . PONV (postoperative nausea and vomiting)   . Supplemental oxygen dependent    2L  via Nasal Canula --  activity and nighttime  . Urinary incontinence in female     Past Surgical History:  Procedure Laterality Date  . APPENDECTOMY  1971  . CARDIAC CATHETERIZATION  05-08-2007  dr Marlou Porch   minor coronary plaquing w/ no significant CAD/  20% mLAD,  10% mRCA,  perserved LV, ef 60-65%  . CATARACT EXTRACTION W/ INTRAOCULAR LENS  IMPLANT, BILATERAL  2001 approx  . CHOLECYSTECTOMY  1988  . CYSTOSCOPY W/ RETROGRADES Bilateral 09/24/2014   Procedure: CYSTOSCOPY WITH RETROGRADE PYELOGRAM;  Surgeon:  Festus Aloe, MD;  Location[facility];  Service: Urology;  Laterality: Bilateral;  . CYSTOSCOPY WITH BIOPSY N/A 09/24/2014   Procedure: CYSTOSCOPY WITH BIOPSY;  Surgeon: Festus Aloe, MD;  Location: [facility];  Service: Urology;  Laterality: N/A;  . ESOPHAGOGASTRODUODENOSCOPY  last one 09-09-2008  . FULGURATION OF BLADDER TUMOR N/A 09/24/2014   Procedure: FULGURATION OF BLADDER TUMOR;  Surgeon: Festus Aloe, MD;  Location: [facility];  Service: Urology;  Laterality: N/A;  . [place]  . POSTERIOR LUMBAR FUSION  10-07-2007   bilateral laminectomy L4 and bilateral L4-5 diskectomy w/ fusion  . TONSILLECTOMY  as child  . TOTAL ABDOMINAL HYSTERECTOMY W/ BILATERAL SALPINGOOPHORECTOMY  1973  . TRANSTHORACIC ECHOCARDIOGRAM  07-07-2011   normal echo,  ef 65-70%    There were no vitals filed for this visit.  Subjective Assessment - 07/09/18 1517    Subjective  still feeling dizzy "most of the time."  unable to describe further.    Patient Stated Goals  "If I knew why I was here."      Currently in Pain?  Yes    Pain Score  0-No pain    Pain Location  Back             Vestibular Assessment - 07/09/18 1519      Orthostatics  BP supine (x 5 minutes)  125/72    HR supine (x 5 minutes)  87    BP standing (after 1 minute)  103/60    HR standing (after 1 minute)  101    BP standing (after 3 minutes)  114/65    HR standing (after 3 minutes)  97               [facility] Adult PT Treatment/Exercise - 07/09/18 1543      Self-Care   Self-Care  Other Self-Care Comments    Other Self-Care Comments   orthostatic hypotension: symptoms, how to manage and recommendations      Exercises   Exercises  Knee/Hip      Knee/Hip Exercises: Standing   Heel Raises  10 reps;Both      Knee/Hip Exercises: Seated   Long Arc Quad  Both;10 reps    Marching  Both;10 reps    Sit to [employer]  10 reps;without UE support              PT Education - 07/09/18 1601    Education Details  HEP, see self care    Person(s) Educated  Patient    Methods  Explanation;Demonstration;Handout    Comprehension  Verbalized understanding;Returned demonstration;Need further instruction          PT Long Term Goals - 07/04/18 1227      PT LONG TERM GOAL #1   Title  verbalize understanding of fall prevention strategies to decrease fall risk    Status  New    Target Date  08/15/18      PT LONG TERM GOAL #2   Title  independent with HEP    Status  New    Target Date  08/15/18      PT LONG TERM GOAL #3   Title  improve dynamic gait index to >/= 20/24 for decreased fall risk    Status  New    Target Date  08/15/18      PT LONG TERM GOAL #4   Title  amb > 500' on various indoor/outdoor surfaces independently for improved mobility and community independence    Status  New    Target Date  08/15/18            Plan - 07/09/18 1601    Clinical Impression Statement  Pt positive for orthostatic hypotension and feel most of her symptoms are contributing to this as well as non-compliance with O2 as instructed.  Pt reports O2 tank is cumbersome so recommended she call home DME company to inquire about something more user friendly.  Pt agreeagle.  Initiated HEP for home today and was fatigued after 4 exercises.  No goals met as only 2nd visit.    Personal Factors and Comorbidities  Comorbidity 3+    Comorbidities  COPD (O2 dependent), peripheral neuropathy, vertigo, obesity    Examination-Activity Limitations  Locomotion Level;Stairs;Stand    Examination-Participation Restrictions  Volunteer    Stability/Clinical Decision Making  Unstable/Unpredictable    Rehab Potential  Good    PT Frequency  2x / week    PT Duration  6 weeks    PT Treatment/Interventions  ADLs/Self Care Home Management;Cryotherapy;Canalith Repostioning;Moist Heat;Electrical Stimulation;Gait training;Stair training;Functional mobility  training;Neuromuscular re-education;Balance training;Therapeutic exercise;Therapeutic activities;Patient/family education;Vestibular    PT Next Visit Plan  complete vestibular assessment, balance exercises, reassess vertigo; continue standing/sitting exercises to help with endurance and balance    PT Home Exercise Plan  Access Code: [email or web address]  Consulted and Agree with Plan of Care  Patient       Patient will benefit from skilled therapeutic intervention in order to improve the following deficits and impairments:  Abnormal gait, Dizziness, Postural dysfunction, Decreased mobility, Decreased endurance, Decreased strength, Difficulty walking, Decreased balance  Visit Diagnosis: Unsteadiness on feet  Muscle weakness (generalized)  History of falling  Dizziness and giddiness     Problem List Patient Active Problem List   Diagnosis Date Noted  . Community acquired pneumonia   . Acute on chronic respiratory failure with hypoxia ([place]) 06/08/2018  . Acute respiratory failure with hypercapnia ([place])   . COPD exacerbation ([place]) 07/29/2017  . Rheumatoid arthritis ([place]) 07/29/2017  . Neuropathy 07/29/2017  . Acute respiratory failure with hypoxia ([place]) 07/29/2017  . Anemia 05/20/2016  . Hyperglycemia 05/20/2016  . Shoulder impingement syndrome 03/25/2015  . Chronic respiratory failure ([place]) 04/10/2012  . Constipation 07/03/2011  . COPD with acute exacerbation ([place]) 06/29/2011  . Stage 3 severe COPD by GOLD classification ([place]) 06/19/2007  . HLD (hyperlipidemia) 05/15/2007  . ACID REFLUX DISEASE 05/15/2007      Laureen Abrahams, PT, DPT 07/09/18 4:04 PM     [facility] [place], [place], [address] Phone: [phone]   Fax:  [phone]  Name: [name] MRN: [id number] Date of Birth: 05-06-1935     PHYSICAL THERAPY DISCHARGE SUMMARY  Visits from Start of Care: 2  Current functional  level related to goals / functional outcomes: See above   Remaining deficits: See above; pt requested d/c at this time due to COVID-19   Education / Equipment: HEP  Plan: Patient agrees to discharge.  Patient goals were not met. Patient is being discharged due to the patient's request.  ?????    Laureen Abrahams, PT, DPT 08/06/18 2:41 PM  [facility] [place], [place] [address]  [phone] (office) [phone] (fax)

## 2018-07-11 ENCOUNTER — Encounter: Payer: Medicare Other | Admitting: Physical Therapy

## 2018-07-11 ENCOUNTER — Telehealth: Payer: Self-pay | Admitting: Physical Therapy

## 2018-07-11 NOTE — Telephone Encounter (Signed)
LVM for pt due to clinic closure to reschedule.  Advised to call office for any questions.  Aubri Gathright F Breianna Delfino, PT, DPT 07/11/18 8:20 PM 

## 2018-07-14 ENCOUNTER — Encounter: Payer: Medicare Other | Admitting: Physical Therapy

## 2018-07-17 ENCOUNTER — Encounter: Payer: Medicare Other | Admitting: Physical Therapy

## 2018-07-21 ENCOUNTER — Encounter: Payer: Medicare Other | Admitting: Physical Therapy

## 2018-07-24 ENCOUNTER — Encounter: Payer: Medicare Other | Admitting: Physical Therapy

## 2018-08-14 DIAGNOSIS — R3911 Hesitancy of micturition: Secondary | ICD-10-CM | POA: Diagnosis not present

## 2018-08-14 DIAGNOSIS — N3020 Other chronic cystitis without hematuria: Secondary | ICD-10-CM | POA: Diagnosis not present

## 2018-08-14 DIAGNOSIS — R350 Frequency of micturition: Secondary | ICD-10-CM | POA: Diagnosis not present

## 2018-08-14 DIAGNOSIS — R351 Nocturia: Secondary | ICD-10-CM | POA: Diagnosis not present

## 2018-08-20 ENCOUNTER — Other Ambulatory Visit: Payer: Self-pay | Admitting: *Deleted

## 2018-09-22 DIAGNOSIS — M150 Primary generalized (osteo)arthritis: Secondary | ICD-10-CM | POA: Diagnosis not present

## 2018-09-22 DIAGNOSIS — M25519 Pain in unspecified shoulder: Secondary | ICD-10-CM | POA: Diagnosis not present

## 2018-09-22 DIAGNOSIS — M25561 Pain in right knee: Secondary | ICD-10-CM | POA: Diagnosis not present

## 2018-09-22 DIAGNOSIS — M0579 Rheumatoid arthritis with rheumatoid factor of multiple sites without organ or systems involvement: Secondary | ICD-10-CM | POA: Diagnosis not present

## 2018-09-22 DIAGNOSIS — M154 Erosive (osteo)arthritis: Secondary | ICD-10-CM | POA: Diagnosis not present

## 2018-09-22 DIAGNOSIS — M797 Fibromyalgia: Secondary | ICD-10-CM | POA: Diagnosis not present

## 2018-09-22 DIAGNOSIS — Z79899 Other long term (current) drug therapy: Secondary | ICD-10-CM | POA: Diagnosis not present

## 2018-09-22 DIAGNOSIS — M545 Low back pain: Secondary | ICD-10-CM | POA: Diagnosis not present

## 2018-09-22 DIAGNOSIS — M5412 Radiculopathy, cervical region: Secondary | ICD-10-CM | POA: Diagnosis not present

## 2018-09-22 DIAGNOSIS — J449 Chronic obstructive pulmonary disease, unspecified: Secondary | ICD-10-CM | POA: Diagnosis not present

## 2018-09-24 DIAGNOSIS — R3911 Hesitancy of micturition: Secondary | ICD-10-CM | POA: Diagnosis not present

## 2018-09-24 DIAGNOSIS — N3941 Urge incontinence: Secondary | ICD-10-CM | POA: Diagnosis not present

## 2018-10-06 DIAGNOSIS — J441 Chronic obstructive pulmonary disease with (acute) exacerbation: Secondary | ICD-10-CM | POA: Diagnosis not present

## 2018-10-07 DIAGNOSIS — Z6835 Body mass index (BMI) 35.0-35.9, adult: Secondary | ICD-10-CM | POA: Diagnosis not present

## 2018-10-07 DIAGNOSIS — M159 Polyosteoarthritis, unspecified: Secondary | ICD-10-CM | POA: Diagnosis not present

## 2018-10-07 DIAGNOSIS — J441 Chronic obstructive pulmonary disease with (acute) exacerbation: Secondary | ICD-10-CM | POA: Diagnosis not present

## 2018-10-07 DIAGNOSIS — E559 Vitamin D deficiency, unspecified: Secondary | ICD-10-CM | POA: Diagnosis not present

## 2018-10-07 DIAGNOSIS — F3342 Major depressive disorder, recurrent, in full remission: Secondary | ICD-10-CM | POA: Diagnosis not present

## 2018-10-07 DIAGNOSIS — M48061 Spinal stenosis, lumbar region without neurogenic claudication: Secondary | ICD-10-CM | POA: Diagnosis not present

## 2018-10-07 DIAGNOSIS — R7303 Prediabetes: Secondary | ICD-10-CM | POA: Diagnosis not present

## 2018-10-07 DIAGNOSIS — J449 Chronic obstructive pulmonary disease, unspecified: Secondary | ICD-10-CM | POA: Diagnosis not present

## 2018-10-07 DIAGNOSIS — J189 Pneumonia, unspecified organism: Secondary | ICD-10-CM | POA: Diagnosis not present

## 2018-10-07 DIAGNOSIS — E782 Mixed hyperlipidemia: Secondary | ICD-10-CM | POA: Diagnosis not present

## 2018-10-07 DIAGNOSIS — D509 Iron deficiency anemia, unspecified: Secondary | ICD-10-CM | POA: Diagnosis not present

## 2018-12-22 DIAGNOSIS — M0579 Rheumatoid arthritis with rheumatoid factor of multiple sites without organ or systems involvement: Secondary | ICD-10-CM | POA: Diagnosis not present

## 2018-12-22 DIAGNOSIS — M797 Fibromyalgia: Secondary | ICD-10-CM | POA: Diagnosis not present

## 2018-12-22 DIAGNOSIS — M25519 Pain in unspecified shoulder: Secondary | ICD-10-CM | POA: Diagnosis not present

## 2018-12-22 DIAGNOSIS — M545 Low back pain: Secondary | ICD-10-CM | POA: Diagnosis not present

## 2018-12-22 DIAGNOSIS — M25561 Pain in right knee: Secondary | ICD-10-CM | POA: Diagnosis not present

## 2018-12-22 DIAGNOSIS — J449 Chronic obstructive pulmonary disease, unspecified: Secondary | ICD-10-CM | POA: Diagnosis not present

## 2018-12-22 DIAGNOSIS — M5412 Radiculopathy, cervical region: Secondary | ICD-10-CM | POA: Diagnosis not present

## 2018-12-22 DIAGNOSIS — M150 Primary generalized (osteo)arthritis: Secondary | ICD-10-CM | POA: Diagnosis not present

## 2018-12-22 DIAGNOSIS — M154 Erosive (osteo)arthritis: Secondary | ICD-10-CM | POA: Diagnosis not present

## 2018-12-22 DIAGNOSIS — Z79899 Other long term (current) drug therapy: Secondary | ICD-10-CM | POA: Diagnosis not present

## 2019-01-09 DIAGNOSIS — R296 Repeated falls: Secondary | ICD-10-CM | POA: Diagnosis not present

## 2019-01-09 DIAGNOSIS — F3342 Major depressive disorder, recurrent, in full remission: Secondary | ICD-10-CM | POA: Diagnosis not present

## 2019-01-09 DIAGNOSIS — E876 Hypokalemia: Secondary | ICD-10-CM | POA: Diagnosis not present

## 2019-01-09 DIAGNOSIS — J449 Chronic obstructive pulmonary disease, unspecified: Secondary | ICD-10-CM | POA: Diagnosis not present

## 2019-01-09 DIAGNOSIS — D509 Iron deficiency anemia, unspecified: Secondary | ICD-10-CM | POA: Diagnosis not present

## 2019-01-09 DIAGNOSIS — E785 Hyperlipidemia, unspecified: Secondary | ICD-10-CM | POA: Diagnosis not present

## 2019-01-09 DIAGNOSIS — E782 Mixed hyperlipidemia: Secondary | ICD-10-CM | POA: Diagnosis not present

## 2019-01-09 DIAGNOSIS — H9191 Unspecified hearing loss, right ear: Secondary | ICD-10-CM | POA: Diagnosis not present

## 2019-01-09 DIAGNOSIS — R7303 Prediabetes: Secondary | ICD-10-CM | POA: Diagnosis not present

## 2019-01-09 DIAGNOSIS — D649 Anemia, unspecified: Secondary | ICD-10-CM | POA: Diagnosis not present

## 2019-01-09 DIAGNOSIS — J189 Pneumonia, unspecified organism: Secondary | ICD-10-CM | POA: Diagnosis not present

## 2019-02-05 DIAGNOSIS — Z23 Encounter for immunization: Secondary | ICD-10-CM | POA: Diagnosis not present

## 2019-02-09 DIAGNOSIS — Z961 Presence of intraocular lens: Secondary | ICD-10-CM | POA: Diagnosis not present

## 2019-02-09 DIAGNOSIS — H18593 Other hereditary corneal dystrophies, bilateral: Secondary | ICD-10-CM | POA: Diagnosis not present

## 2019-02-09 DIAGNOSIS — M0689 Other specified rheumatoid arthritis, multiple sites: Secondary | ICD-10-CM | POA: Diagnosis not present

## 2019-02-09 DIAGNOSIS — H0288A Meibomian gland dysfunction right eye, upper and lower eyelids: Secondary | ICD-10-CM | POA: Diagnosis not present

## 2019-02-09 DIAGNOSIS — H18513 Endothelial corneal dystrophy, bilateral: Secondary | ICD-10-CM | POA: Diagnosis not present

## 2019-02-09 DIAGNOSIS — H43812 Vitreous degeneration, left eye: Secondary | ICD-10-CM | POA: Diagnosis not present

## 2019-02-09 DIAGNOSIS — H04123 Dry eye syndrome of bilateral lacrimal glands: Secondary | ICD-10-CM | POA: Diagnosis not present

## 2019-02-09 DIAGNOSIS — H43391 Other vitreous opacities, right eye: Secondary | ICD-10-CM | POA: Diagnosis not present

## 2019-02-09 DIAGNOSIS — H3554 Dystrophies primarily involving the retinal pigment epithelium: Secondary | ICD-10-CM | POA: Diagnosis not present

## 2019-02-09 DIAGNOSIS — Z79899 Other long term (current) drug therapy: Secondary | ICD-10-CM | POA: Diagnosis not present

## 2019-02-09 DIAGNOSIS — H0288B Meibomian gland dysfunction left eye, upper and lower eyelids: Secondary | ICD-10-CM | POA: Diagnosis not present

## 2019-02-09 DIAGNOSIS — H35361 Drusen (degenerative) of macula, right eye: Secondary | ICD-10-CM | POA: Diagnosis not present

## 2019-03-11 DIAGNOSIS — M0689 Other specified rheumatoid arthritis, multiple sites: Secondary | ICD-10-CM | POA: Diagnosis not present

## 2019-03-11 DIAGNOSIS — Z79899 Other long term (current) drug therapy: Secondary | ICD-10-CM | POA: Diagnosis not present

## 2019-04-06 DIAGNOSIS — M25461 Effusion, right knee: Secondary | ICD-10-CM | POA: Diagnosis not present

## 2019-04-06 DIAGNOSIS — M0579 Rheumatoid arthritis with rheumatoid factor of multiple sites without organ or systems involvement: Secondary | ICD-10-CM | POA: Diagnosis not present

## 2019-05-04 DIAGNOSIS — M2550 Pain in unspecified joint: Secondary | ICD-10-CM | POA: Diagnosis not present

## 2019-05-04 DIAGNOSIS — M48061 Spinal stenosis, lumbar region without neurogenic claudication: Secondary | ICD-10-CM | POA: Diagnosis not present

## 2019-05-04 DIAGNOSIS — K219 Gastro-esophageal reflux disease without esophagitis: Secondary | ICD-10-CM | POA: Diagnosis not present

## 2019-05-04 DIAGNOSIS — M0579 Rheumatoid arthritis with rheumatoid factor of multiple sites without organ or systems involvement: Secondary | ICD-10-CM | POA: Diagnosis not present

## 2019-05-04 DIAGNOSIS — R002 Palpitations: Secondary | ICD-10-CM | POA: Diagnosis not present

## 2019-05-04 DIAGNOSIS — R350 Frequency of micturition: Secondary | ICD-10-CM | POA: Diagnosis not present

## 2019-05-04 DIAGNOSIS — J449 Chronic obstructive pulmonary disease, unspecified: Secondary | ICD-10-CM | POA: Diagnosis not present

## 2019-05-04 DIAGNOSIS — M8580 Other specified disorders of bone density and structure, unspecified site: Secondary | ICD-10-CM | POA: Diagnosis not present

## 2019-05-04 DIAGNOSIS — F3340 Major depressive disorder, recurrent, in remission, unspecified: Secondary | ICD-10-CM | POA: Diagnosis not present

## 2019-05-04 DIAGNOSIS — E782 Mixed hyperlipidemia: Secondary | ICD-10-CM | POA: Diagnosis not present

## 2019-05-08 ENCOUNTER — Ambulatory Visit: Payer: Medicare Other | Admitting: Cardiology

## 2019-05-11 DIAGNOSIS — Z20828 Contact with and (suspected) exposure to other viral communicable diseases: Secondary | ICD-10-CM | POA: Diagnosis not present

## 2019-05-15 ENCOUNTER — Inpatient Hospital Stay (HOSPITAL_COMMUNITY)
Admission: EM | Admit: 2019-05-15 | Discharge: 2019-05-18 | DRG: 177 | Disposition: A | Discharge status: Home Health | Payer: Medicare Other | Attending: Family Medicine | Admitting: Family Medicine

## 2019-05-15 ENCOUNTER — Encounter (HOSPITAL_COMMUNITY): Payer: Self-pay | Admitting: Emergency Medicine

## 2019-05-15 ENCOUNTER — Emergency Department (HOSPITAL_COMMUNITY): Payer: Medicare Other

## 2019-05-15 ENCOUNTER — Other Ambulatory Visit: Payer: Self-pay

## 2019-05-15 ENCOUNTER — Ambulatory Visit: Payer: Self-pay

## 2019-05-15 DIAGNOSIS — E785 Hyperlipidemia, unspecified: Secondary | ICD-10-CM | POA: Diagnosis not present

## 2019-05-15 DIAGNOSIS — R0902 Hypoxemia: Secondary | ICD-10-CM | POA: Diagnosis not present

## 2019-05-15 DIAGNOSIS — M797 Fibromyalgia: Secondary | ICD-10-CM | POA: Diagnosis not present

## 2019-05-15 DIAGNOSIS — M069 Rheumatoid arthritis, unspecified: Secondary | ICD-10-CM | POA: Diagnosis present

## 2019-05-15 DIAGNOSIS — G4733 Obstructive sleep apnea (adult) (pediatric): Secondary | ICD-10-CM | POA: Diagnosis present

## 2019-05-15 DIAGNOSIS — Z9981 Dependence on supplemental oxygen: Secondary | ICD-10-CM

## 2019-05-15 DIAGNOSIS — R0602 Shortness of breath: Secondary | ICD-10-CM | POA: Diagnosis not present

## 2019-05-15 DIAGNOSIS — I5032 Chronic diastolic (congestive) heart failure: Secondary | ICD-10-CM | POA: Diagnosis not present

## 2019-05-15 DIAGNOSIS — J449 Chronic obstructive pulmonary disease, unspecified: Secondary | ICD-10-CM | POA: Diagnosis not present

## 2019-05-15 DIAGNOSIS — J9611 Chronic respiratory failure with hypoxia: Secondary | ICD-10-CM | POA: Diagnosis present

## 2019-05-15 DIAGNOSIS — U071 COVID-19: Secondary | ICD-10-CM | POA: Diagnosis not present

## 2019-05-15 DIAGNOSIS — Z9049 Acquired absence of other specified parts of digestive tract: Secondary | ICD-10-CM

## 2019-05-15 DIAGNOSIS — E669 Obesity, unspecified: Secondary | ICD-10-CM | POA: Diagnosis present

## 2019-05-15 DIAGNOSIS — F329 Major depressive disorder, single episode, unspecified: Secondary | ICD-10-CM | POA: Diagnosis present

## 2019-05-15 DIAGNOSIS — H9191 Unspecified hearing loss, right ear: Secondary | ICD-10-CM | POA: Diagnosis present

## 2019-05-15 DIAGNOSIS — R7303 Prediabetes: Secondary | ICD-10-CM | POA: Diagnosis present

## 2019-05-15 DIAGNOSIS — E860 Dehydration: Secondary | ICD-10-CM | POA: Diagnosis present

## 2019-05-15 DIAGNOSIS — J9621 Acute and chronic respiratory failure with hypoxia: Secondary | ICD-10-CM | POA: Diagnosis not present

## 2019-05-15 DIAGNOSIS — Z9181 History of falling: Secondary | ICD-10-CM

## 2019-05-15 DIAGNOSIS — I110 Hypertensive heart disease with heart failure: Secondary | ICD-10-CM | POA: Diagnosis present

## 2019-05-15 DIAGNOSIS — Z7951 Long term (current) use of inhaled steroids: Secondary | ICD-10-CM

## 2019-05-15 DIAGNOSIS — I2510 Atherosclerotic heart disease of native coronary artery without angina pectoris: Secondary | ICD-10-CM | POA: Diagnosis present

## 2019-05-15 DIAGNOSIS — K219 Gastro-esophageal reflux disease without esophagitis: Secondary | ICD-10-CM | POA: Diagnosis present

## 2019-05-15 DIAGNOSIS — Z79899 Other long term (current) drug therapy: Secondary | ICD-10-CM

## 2019-05-15 DIAGNOSIS — Z6837 Body mass index (BMI) 37.0-37.9, adult: Secondary | ICD-10-CM

## 2019-05-15 DIAGNOSIS — Z7982 Long term (current) use of aspirin: Secondary | ICD-10-CM

## 2019-05-15 DIAGNOSIS — Z87891 Personal history of nicotine dependence: Secondary | ICD-10-CM

## 2019-05-15 DIAGNOSIS — Z791 Long term (current) use of non-steroidal anti-inflammatories (NSAID): Secondary | ICD-10-CM

## 2019-05-15 LAB — CBC WITH DIFFERENTIAL/PLATELET
Abs Immature Granulocytes: 0.03 10*3/uL (ref 0.00–0.07)
Basophils Absolute: 0 10*3/uL (ref 0.0–0.1)
Basophils Relative: 0 %
Eosinophils Absolute: 0 10*3/uL (ref 0.0–0.5)
Eosinophils Relative: 0 %
HCT: 43.3 % (ref 36.0–46.0)
Hemoglobin: 13.8 g/dL (ref 12.0–15.0)
Immature Granulocytes: 1 %
Lymphocytes Relative: 19 %
Lymphs Abs: 1.2 10*3/uL (ref 0.7–4.0)
MCH: 29.4 pg (ref 26.0–34.0)
MCHC: 31.9 g/dL (ref 30.0–36.0)
MCV: 92.3 fL (ref 80.0–100.0)
Monocytes Absolute: 0.6 10*3/uL (ref 0.1–1.0)
Monocytes Relative: 9 %
Neutro Abs: 4.5 10*3/uL (ref 1.7–7.7)
Neutrophils Relative %: 71 %
Platelets: 283 10*3/uL (ref 150–400)
RBC: 4.69 MIL/uL (ref 3.87–5.11)
RDW: 12.7 % (ref 11.5–15.5)
WBC: 6.4 10*3/uL (ref 4.0–10.5)
nRBC: 0 % (ref 0.0–0.2)

## 2019-05-15 LAB — POCT I-STAT 7, (LYTES, BLD GAS, ICA,H+H)
Acid-Base Excess: 4 mmol/L — ABNORMAL HIGH (ref 0.0–2.0)
Bicarbonate: 28.4 mmol/L — ABNORMAL HIGH (ref 20.0–28.0)
Calcium, Ion: 1.08 mmol/L — ABNORMAL LOW (ref 1.15–1.40)
HCT: 39 % (ref 36.0–46.0)
Hemoglobin: 13.3 g/dL (ref 12.0–15.0)
O2 Saturation: 100 %
Patient temperature: 99.1
Potassium: 3.3 mmol/L — ABNORMAL LOW (ref 3.5–5.1)
Sodium: 134 mmol/L — ABNORMAL LOW (ref 135–145)
TCO2: 30 mmol/L (ref 22–32)
pCO2 arterial: 40.6 mmHg (ref 32.0–48.0)
pH, Arterial: 7.454 — ABNORMAL HIGH (ref 7.350–7.450)
pO2, Arterial: 165 mmHg — ABNORMAL HIGH (ref 83.0–108.0)

## 2019-05-15 LAB — COMPREHENSIVE METABOLIC PANEL
ALT: 14 U/L (ref 0–44)
AST: 21 U/L (ref 15–41)
Albumin: 3.4 g/dL — ABNORMAL LOW (ref 3.5–5.0)
Alkaline Phosphatase: 61 U/L (ref 38–126)
Anion gap: 12 (ref 5–15)
BUN: 13 mg/dL (ref 8–23)
CO2: 28 mmol/L (ref 22–32)
Calcium: 8.7 mg/dL — ABNORMAL LOW (ref 8.9–10.3)
Chloride: 94 mmol/L — ABNORMAL LOW (ref 98–111)
Creatinine, Ser: 0.94 mg/dL (ref 0.44–1.00)
GFR calc Af Amer: >60 mL/min (ref >60)
GFR calc non Af Amer: 57 mL/min — ABNORMAL LOW (ref >60)
Glucose, Bld: 110 mg/dL — ABNORMAL HIGH (ref 70–99)
Potassium: 3.5 mmol/L (ref 3.5–5.1)
Sodium: 134 mmol/L — ABNORMAL LOW (ref 135–145)
Total Bilirubin: 0.4 mg/dL (ref 0.3–1.2)
Total Protein: 6.6 g/dL (ref 6.5–8.1)

## 2019-05-15 LAB — LACTIC ACID, PLASMA: Lactic Acid, Venous: 0.9 mmol/L (ref 0.5–1.9)

## 2019-05-15 LAB — LACTATE DEHYDROGENASE: LDH: 175 U/L (ref 98–192)

## 2019-05-15 MED ORDER — LACTATED RINGERS IV BOLUS
500.0000 mL | Freq: Once | INTRAVENOUS | Status: AC
  Administered 2019-05-15: 500 mL via INTRAVENOUS

## 2019-05-15 MED ORDER — DEXAMETHASONE SODIUM PHOSPHATE 10 MG/ML IJ SOLN
10.0000 mg | Freq: Once | INTRAMUSCULAR | Status: AC
  Administered 2019-05-15: 10 mg via INTRAVENOUS
  Filled 2019-05-15: qty 1

## 2019-05-15 MED ORDER — SODIUM CHLORIDE 0.9 % IV SOLN
200.0000 mg | Freq: Once | INTRAVENOUS | Status: AC
  Administered 2019-05-15: 200 mg via INTRAVENOUS
  Filled 2019-05-15: qty 40

## 2019-05-15 MED ORDER — SODIUM CHLORIDE 0.9 % IV SOLN
100.0000 mg | Freq: Every day | INTRAVENOUS | Status: DC
  Administered 2019-05-16 – 2019-05-18 (×3): 100 mg via INTRAVENOUS
  Filled 2019-05-15 (×4): qty 20

## 2019-05-15 NOTE — ED Provider Notes (Signed)
[facility] DEPARTMENT Provider Note   CSN: [id number] Arrival date & time: 05/15/19  1942     History Chief Complaint  Patient presents with  . COVID+/Fever/SOB/Cough    [name] is a 84 y.o. female.  HPI 84 year old female with history of chronic respiratory failure on 2 L nasal cannula at home, COPD, rheumatoid arthritis currently on immunosuppressive drugs, presenting to the emergency department for shortness of breath, cough, malaise and fatigue as well as diarrhea over the past 3 to 4 days.  Patient states that [name] was tested positive approximately 3 days ago for Covid.  States that her symptoms have progressively gotten worse with shortness of breath, overall feeling ill.  Nonbloody diarrhea, inability to tolerate p.o. secondary to nausea, states that [name] feels dehydrated.  Patient denies any chest pain, however does state that sometimes when [name] coughs [name] has some chest discomfort.  No hemoptysis or hematemesis, no melena or blood in stool.    Past Medical History:  Diagnosis Date  . ACID REFLUX DISEASE 05/15/2007   Qualifier: Diagnosis of  By: [name] MD, Christena Deem   . Acute and chronic respiratory failure with hypoxia ([place])   . Acute on chronic respiratory failure with hypoxia ([place]) 06/08/2018  . Acute respiratory failure with hypercapnia ([place])   . Acute respiratory failure with hypoxia ([place]) 07/29/2017  . Anemia 05/20/2016  . Arthritis   . Bladder neoplasm   . Body mass index 34.0-34.9, adult   . Bronchitis 07/06/2011  . Chest pain 07/06/2011  . Chronic cough   . Chronic respiratory failure ([place])   . Community acquired pneumonia   . Constipation 07/03/2011  . COPD (chronic obstructive pulmonary disease) (HCC)    PULMOLOGIST-  DR [name]--  GOLD III W/  CHRONIC RESPIRATORY FAILURE--  O2 DEPENDENT  . COPD with acute exacerbation ([place]) 06/29/2011  . Coronary arteriosclerosis   . Cough productive of purulent sputum   . Depression   .  Dyslipidemia   . Falls frequently   . Fibromyalgia   . GERD (gastroesophageal reflux disease)   . Hearing loss in right ear   . Hematuria   . History of nephritis    as child dx w/ Bright's disease (glomerulonephritis)  . HLD (hyperlipidemia) 05/15/2007   Qualifier: Diagnosis of  By: [name] MD, Christena Deem   . Hyperglycemia 05/20/2016  . Hyperlipidemia   . Hypomagnesemia   . Hypoxemia   . Iron deficiency anemia, unspecified   . Leukopenia   . Low vitamin D level   . Mild obstructive sleep apnea    no cpap recommendation  . Neuropathy 07/29/2017  . Neuropathy, peripheral, idiopathic   . Osteopenia   . Other allergic rhinitis   . Pelvic pain in female   . Polyarthralgia   . PONV (postoperative nausea and vomiting)   . Pre-diabetes   . Rheumatoid arthritis ([place]) 07/29/2017  . Shoulder impingement syndrome 03/25/2015  . Sleep disorder, unspecified   . Spinal stenosis   . Stage 3 severe COPD by GOLD classification ([place]) 06/19/2007   Followed in Pulmonary clinic/ [facility]/ [place]  - hfa 90% 05/15/2012  - PFTs 05/15/2012 FEV1  0.61 ( 36%) ratio 57 and 21% better p B2 and DLCO 44%  106 corrected  - Referred to [facility] 05/15/12 - Rec pulmonary f/u prn 02/11/13    . Supplemental oxygen dependent    2L  via Nasal Canula --  activity and nighttime  . Urinary incontinence in female  Patient Active Problem List   Diagnosis Date Noted  . Community acquired pneumonia   . Acute on chronic respiratory failure with hypoxia ([place]) 06/08/2018  . Acute respiratory failure with hypercapnia ([place])   . COPD exacerbation ([place]) 07/29/2017  . Rheumatoid arthritis ([place]) 07/29/2017  . Neuropathy 07/29/2017  . Acute respiratory failure with hypoxia ([place]) 07/29/2017  . Anemia 05/20/2016  . Hyperglycemia 05/20/2016  . Shoulder impingement syndrome 03/25/2015  . Chronic respiratory failure ([place]) 04/10/2012  . Constipation 07/03/2011  . COPD with acute exacerbation ([place]) 06/29/2011  . Stage 3 severe COPD  by GOLD classification ([place]) 06/19/2007  . HLD (hyperlipidemia) 05/15/2007  . ACID REFLUX DISEASE 05/15/2007    Past Surgical History:  Procedure Laterality Date  . APPENDECTOMY  1971  . CARDIAC CATHETERIZATION  05-08-2007  dr Marlou Porch   minor coronary plaquing w/ no significant CAD/  20% mLAD,  10% mRCA,  perserved LV, ef 60-65%  . CATARACT EXTRACTION W/ INTRAOCULAR LENS  IMPLANT, BILATERAL  2001 approx  . CHOLECYSTECTOMY  1988  . CYSTOSCOPY W/ RETROGRADES Bilateral 09/24/2014   Procedure: CYSTOSCOPY WITH RETROGRADE PYELOGRAM;  Surgeon: Festus Aloe, MD;  Location: [facility];  Service: Urology;  Laterality: Bilateral;  . CYSTOSCOPY WITH BIOPSY N/A 09/24/2014   Procedure: CYSTOSCOPY WITH BIOPSY;  Surgeon: Festus Aloe, MD;  Location: [facility];  Service: Urology;  Laterality: N/A;  . ESOPHAGOGASTRODUODENOSCOPY  last one 09-09-2008  . FULGURATION OF BLADDER TUMOR N/A 09/24/2014   Procedure: FULGURATION OF BLADDER TUMOR;  Surgeon: Festus Aloe, MD;  Location: [facility];  Service: Urology;  Laterality: N/A;  . [place]  . POSTERIOR LUMBAR FUSION  10-07-2007   bilateral laminectomy L4 and bilateral L4-5 diskectomy w/ fusion  . TONSILLECTOMY  as child  . TOTAL ABDOMINAL HYSTERECTOMY W/ BILATERAL SALPINGOOPHORECTOMY  1973  . TRANSTHORACIC ECHOCARDIOGRAM  07-07-2011   normal echo,  ef 65-70%     OB History   No obstetric history on file.     Family History  Problem Relation Age of Onset  . Asthma Maternal Grandmother   . Breast cancer Maternal Grandmother     Social History   Tobacco Use  . Smoking status: Former Smoker    Packs/day: 3.00    Years: 60.00    Pack years: 180.00    Types: Cigarettes    Quit date: 06/25/2011    Years since quitting: 7.8  . Smokeless tobacco: Never Used  Substance Use Topics  . Alcohol use: No  . Drug use: No    Home Medications Prior to Admission medications     Medication Sig Start Date End Date Taking? Authorizing Provider  acetaminophen (TYLENOL) 325 MG tablet Take 2 tablets (650 mg total) by mouth every 6 (six) hours as needed for mild pain (or Fever >/= 101). 08/02/17   Emokpae, Courage, MD  albuterol (PROVENTIL HFA;VENTOLIN HFA) 108 (90 BASE) MCG/ACT inhaler Inhale 2 puffs into the lungs every 6 (six) hours as needed. For shortness of breath.    [provider]  albuterol (PROVENTIL) (2.5 MG/3ML) 0.083% nebulizer solution Take 3 mLs (2.5 mg total) by nebulization every 4 (four) hours as needed for wheezing or shortness of breath. For shortness of breath. 07/28/17   Roxan Hockey, MD  aspirin EC 81 MG tablet Take 81 mg by mouth daily.    [provider]  atorvastatin (LIPITOR) 40 MG tablet Take 40 mg by mouth every morning.     [provider]  azelastine (ASTELIN) 0.1 % nasal spray Place 1 spray into the nose 2 (two) times daily.    [provider]  budesonide-formoterol (SYMBICORT) 160-4.5 MCG/ACT inhaler Inhale 2 puffs into the lungs 2 (two) times daily. 08/02/17   Roxan Hockey, MD  Calcium Carbonate-Vitamin D (CALCIUM + D PO) Take 2 tablets by mouth daily.     [provider]  calcium-vitamin D (OSCAL WITH D) 500-200 MG-UNIT tablet Take 2 tablets by mouth daily with breakfast. 06/11/18   Georgette Shell, MD  carboxymethylcellulose (REFRESH PLUS) 0.5 % SOLN 2 drops 2 (two) times daily as needed (dry eye).    [provider]  cefdinir (OMNICEF) 300 MG capsule Take 1 capsule (300 mg total) by mouth 2 (two) times daily. Patient not taking: Reported on 07/04/2018 06/10/18   Georgette Shell, MD  cetirizine (ZYRTEC) 10 MG tablet Take 1 tablet (10 mg total) by mouth every morning. 08/02/17   Roxan Hockey, MD  Cholecalciferol (VITAMIN D) 2000 UNITS tablet Take 4,000 Units by mouth daily.     [provider]  cyanocobalamin 1000 MCG tablet Take 1 tablet (1,000 mcg total) by mouth  daily. 08/02/17   Roxan Hockey, MD  doxycycline (VIBRA-TABS) 100 MG tablet Take 1 tablet (100 mg total) by mouth every 12 (twelve) hours. Patient not taking: Reported on 07/04/2018 06/10/18   Georgette Shell, MD  esomeprazole (NEXIUM) 40 MG capsule Take 1 capsule (40 mg total) by mouth every morning. 08/02/17   Roxan Hockey, MD  folic acid (FOLVITE) 1 MG tablet Take 1 tablet (1 mg total) by mouth daily. 08/02/17   Roxan Hockey, MD  gabapentin (NEURONTIN) 100 MG capsule Take 2-3 capsules (200-300 mg total) by mouth 3 (three) times daily. 200 mg every AM and at noon and 300 mg QHS 08/02/17   Roxan Hockey, MD  hydroxychloroquine (PLAQUENIL) 200 MG tablet Take 200 mg by mouth daily.    [provider]  ipratropium-albuterol (DUONEB) 0.5-2.5 (3) MG/3ML SOLN Take 3 mLs by nebulization 2 (two) times daily.    [provider]  meloxicam (MOBIC) 15 MG tablet Take 15 mg by mouth daily. 04/24/18   [provider]  methotrexate (RHEUMATREX) 2.5 MG tablet Take 15 mg by mouth once a week.  06/05/17   [provider]  mupirocin ointment (BACTROBAN) 2 % Place 1 application into the nose 2 (two) times daily. 06/10/18   Georgette Shell, MD  polyethylene glycol [facility] / Floria Raveling) packet Take 17 g by mouth daily. 08/03/17   Roxan Hockey, MD  polyvinyl alcohol (LIQUIFILM TEARS) 1.4 % ophthalmic solution Place 2 drops into both eyes as needed for dry eyes. 08/02/17   Roxan Hockey, MD  predniSONE (DELTASONE) 10 MG tablet Take 30 mg for the first 3 days then 20 mg for the next 3 days then 10 mg daily till done. Patient not taking: Reported on 07/04/2018 06/10/18   Georgette Shell, MD  sertraline (ZOLOFT) 100 MG tablet Take 100 mg by mouth daily.    [provider]  tiotropium (SPIRIVA) 18 MCG inhalation capsule Place 1 capsule (18 mcg total) into inhaler and inhale every morning. 08/02/17   Roxan Hockey, MD  vitamin A 10000 UNIT capsule Take 10,000  Units by mouth daily.    [provider]    Allergies    Kapidex [dexlansoprazole] and Zegerid [omeprazole]  Review of Systems   Review of Systems  Constitutional: Positive for chills, fatigue and fever.  HENT: Negative  for ear pain and sore throat.   Eyes: Negative for pain and visual disturbance.  Respiratory: Positive for cough and shortness of breath.   Cardiovascular: Negative for chest pain and palpitations.  Gastrointestinal: Positive for diarrhea. Negative for abdominal pain and vomiting.  Genitourinary: Negative for dysuria and hematuria.  Musculoskeletal: Positive for myalgias. Negative for arthralgias and back pain.  Skin: Negative for color change and rash.  Neurological: Negative for seizures and syncope.  All other systems reviewed and are negative.   Physical Exam Updated Vital Signs BP 94/65 (BP Location: Left Arm)   Pulse (!) 106   Temp 99.1 F (37.3 C) (Oral)   Resp 20   SpO2 (!) 88%   Physical Exam Vitals and nursing note reviewed.  Constitutional:      General: [name] is not in acute distress.    Appearance: [name] is well-developed. [name] is ill-appearing.     Comments: Frail, elderly, ill appearing   HENT:     Head: Normocephalic and atraumatic.  Eyes:     Conjunctiva/sclera: Conjunctivae normal.  Cardiovascular:     Rate and Rhythm: Normal rate and regular rhythm.     Heart sounds: No murmur.  Pulmonary:     Effort: Pulmonary effort is normal. No respiratory distress.     Breath sounds: Normal breath sounds.  Abdominal:     General: Abdomen is flat.     Palpations: Abdomen is soft.     Tenderness: There is no abdominal tenderness. There is no guarding or rebound.     Hernia: No hernia is present.  Musculoskeletal:     Cervical back: Neck supple.  Skin:    General: Skin is warm and dry.  Neurological:     General: No focal deficit present.     Mental Status: [name] is alert and oriented to person, place, and time.  Psychiatric:         Mood and Affect: Mood normal.        Behavior: Behavior normal.     ED Results / Procedures / Treatments   Labs (all labs ordered are listed, but only abnormal results are displayed) Labs Reviewed  COMPREHENSIVE METABOLIC PANEL - Abnormal; Notable for the following components:      Result Value   Sodium 134 (*)    Chloride 94 (*)    Glucose, Bld 110 (*)    Calcium 8.7 (*)    Albumin 3.4 (*)    GFR calc non Af Amer 57 (*)    All other components within normal limits  POCT I-STAT 7, (LYTES, BLD GAS, ICA,H+H) - Abnormal; Notable for the following components:   pH, Arterial 7.454 (*)    pO2, Arterial 165.0 (*)    Bicarbonate 28.4 (*)    Acid-Base Excess 4.0 (*)    Sodium 134 (*)    Potassium 3.3 (*)    Calcium, Ion 1.08 (*)    All other components within normal limits  CULTURE, BLOOD (ROUTINE X 2)  CULTURE, BLOOD (ROUTINE X 2)  CBC WITH DIFFERENTIAL/PLATELET  LACTIC ACID, PLASMA  LACTIC ACID, PLASMA  D-DIMER, QUANTITATIVE (NOT AT [facility])  PROCALCITONIN  LACTATE DEHYDROGENASE  FERRITIN  TRIGLYCERIDES  FIBRINOGEN  C-REACTIVE PROTEIN  POC SARS CORONAVIRUS 2 AG -  ED  I-STAT ARTERIAL BLOOD GAS, ED    EKG EKG Interpretation  Date/Time:  Friday May 15 2019 21:00:24 EST Ventricular Rate:  103 PR Interval:  160 QRS Duration: 74 QT Interval:  326 QTC Calculation: 427 R Axis:  15 Text Interpretation: Sinus tachycardia Nonspecific ST and T wave abnormality Confirmed by Lajean Saver [phone]) on 05/15/2019 9:24:30 PM   Radiology DG Chest Portable 1 View  Result Date: 05/15/2019 CLINICAL DATA:  COVID positive, short of breath EXAM: PORTABLE CHEST 1 VIEW COMPARISON:  06/08/2018 FINDINGS: The heart size and mediastinal contours are within normal limits. Aortic atherosclerosis. Both lungs are clear. The visualized skeletal structures are unremarkable. IMPRESSION: No active disease. Electronically Signed   By: Donavan Foil M.D.   On: 05/15/2019 22:23     Procedures Procedures (including critical care time)  Medications Ordered in ED Medications  lactated ringers bolus 500 mL (has no administration in time range)  dexamethasone (DECADRON) injection 10 mg (has no administration in time range)  remdesivir 200 mg in sodium chloride 0.9% 250 mL IVPB (has no administration in time range)  remdesivir 100 mg in sodium chloride 0.9 % 100 mL IVPB (has no administration in time range)    ED Course  I have reviewed the triage vital signs and the nursing notes.  Pertinent labs & imaging results that were available during my care of the patient were reviewed by me and considered in my medical decision making (see chart for details).    MDM Rules/Calculators/A&P                      84yo F presenting with sob in the setting of recent COVID infection. Afebrile, hypoxic and tachycardic on arrival. Ill appearing. Given decadron and remdesevir. Given her co-morbidities needs admission, placed on 3L  secondary to hypoxia when ambulating, down in the 70's. EKG with no sign of STEMI. Doubt ACS. Likely worsening resp function secondary to COVID. CXR with no focal PNA on my review. Holding on abx. CBC reassuring on my review. CMP reassuring as well.   Admitted in stable condition.   The attending physician was present and available for all medical decision making and procedures related to this patient's care.  Final Clinical Impression(s) / ED Diagnoses Final diagnoses:  COVID-19  Hypoxia    Rx / DC Orders ED Discharge Orders    None       Kizzie Fantasia, MD 05/15/19 2255    Lajean Saver, MD 05/15/19 2318

## 2019-05-15 NOTE — H&P (Signed)
[name] VEL:[id number] DOB: 01/05/1936 DOA: 05/15/2019     PCP: [name], MD   Outpatient Specialists:  NONE    Patient arrived to ER on 05/15/19 at [facility]  Patient coming from: home Lives With family    Chief Complaint:   Chief Complaint  Patient presents with  . COVID+/Fever/SOB/Cough    HPI: [name] is a 84 y.o. female with medical history significant of  RA on methotrexate and plaquenil, COPD on 2 L of o2 mild OSA not on PAPtherapy, CAD and GERD    Presented with   shortness of breath, fatigue and diarrhea  pt originally was exposed 12 days ago. She tested positive this week.  Reports fever chills shortness of breath productive cough fatigue severe diarrhea loss of taste and smell symptoms been going on for past 1 week.  States other family members are not sick.  Infectious risk factors:  Reports  fever, shortness of breath, dry cough, chest pain, Sore throat, URI symptoms, anosmia/change in taste, N/V/Diarrhea/abdominal pain,  Body aches, severe fatigue Reports known sick contacts,     KNOWN COVID POSITIVE   In  ER RAPID COVID TEST    POSITIVE,    No results found for: SARSCOV2NAA   Regarding pertinent Chronic problems:     Hyperlipidemia -  on statins Lipitor    chronic CHF diastolic - last echo April 2019 Showing evidence of diastolic CHF range of 25%   to 60%. (grade 1   diastolic dysfunction).    CAD  - On Aspirin, statin,                    obesity-   BMI Readings from Last 1 Encounters:  06/10/18 37.93 kg/m       COPD - not  followed by pulmonology on baseline oxygen  2L,   on DuoNeb    OSA -on nocturnal oxygen,     While in ER: Noted to be hypoxic requiring 4 L of oxygen chest x-ray show no evidence of pneumonia CTA ordered but currently pending Confirmed to be Covid positive  The following Work up has been ordered so far:  Orders Placed This Encounter  Procedures  . Blood Culture (routine x 2)  . DG Chest  Portable 1 View  . CBC with Differential  . Comprehensive metabolic panel  . Lactic acid, plasma  . D-dimer, quantitative  . Procalcitonin  . Lactate dehydrogenase  . Ferritin  . Triglycerides  . Fibrinogen  . C-reactive protein  . Diet NPO time specified  . Cardiac monitoring  . Insert peripheral IV x 2  . Initiate Carrier Fluid Protocol  . Place surgical mask on patient  . Patient to wear surgical mask during transportation  . Assess patient for ability to self-prone. If able (can move self in bed, ambulate) and stable (SpO2 and oxygen requirement):  . RN/NT - Document specific oxygen requirements in CHL  . Notify EDP if new oxygen requirements escalates > 4L per minute [place]  . RN to draw the following extra tubes:  . remdesivir per pharmacy consult  . Consult to hospitalist  ALL PATIENTS BEING ADMITTED/HAVING PROCEDURES NEED COVID-19 SCREENING  . Airborne and Contact precautions  . Pulse oximetry, continuous  . POC SARS Coronavirus 2 Ag-ED - Nasal Swab (BD Veritor Kit)  . I-Stat arterial blood gas, ED  . I-STAT 7, (LYTES, BLD GAS, ICA, H+H)  . EKG 12-Lead     Following Medications were ordered  in ER: Medications  remdesivir 200 mg in sodium chloride 0.9% 250 mL IVPB (200 mg Intravenous New Bag/Given 05/15/19 2346)  remdesivir 100 mg in sodium chloride 0.9 % 100 mL IVPB (has no administration in time range)  lactated ringers bolus 500 mL (0 mLs Intravenous Stopped 05/15/19 2346)  dexamethasone (DECADRON) injection 10 mg (10 mg Intravenous Given 05/15/19 2346)        Consult Orders  (From admission, onward)         Start     Ordered   05/15/19 2254  Consult to hospitalist  ALL PATIENTS BEING ADMITTED/HAVING PROCEDURES NEED COVID-19 SCREENING Pg at 23:04  Once    Comments: ALL PATIENTS BEING ADMITTED/HAVING PROCEDURES NEED COVID-19 SCREENING  Provider:  (Not yet assigned)  Question Answer Comment  Place call to: [facility]   Reason for Consult Admit       05/15/19 2253          Significant initial  Findings: Abnormal Labs Reviewed  COMPREHENSIVE METABOLIC PANEL - Abnormal; Notable for the following components:      Result Value   Sodium 134 (*)    Chloride 94 (*)    Glucose, Bld 110 (*)    Calcium 8.7 (*)    Albumin 3.4 (*)    GFR calc non Af Amer 57 (*)    All other components within normal limits  POCT I-STAT 7, (LYTES, BLD GAS, ICA,H+H) - Abnormal; Notable for the following components:   pH, Arterial 7.454 (*)    pO2, Arterial 165.0 (*)    Bicarbonate 28.4 (*)    Acid-Base Excess 4.0 (*)    Sodium 134 (*)    Potassium 3.3 (*)    Calcium, Ion 1.08 (*)    All other components within normal limits     Otherwise labs showing:    Recent Labs  Lab 05/15/19 2111 05/15/19 2246  NA 134* 134*  K 3.5 3.3*  CO2 28  --   GLUCOSE 110*  --   BUN 13  --   CREATININE 0.94  --   CALCIUM 8.7*  --     Cr   stable,    Lab Results  Component Value Date   CREATININE 0.94 05/15/2019   CREATININE 0.67 06/10/2018   CREATININE 0.75 06/09/2018    Recent Labs  Lab 05/15/19 2111  AST 21  ALT 14  ALKPHOS 61  BILITOT 0.4  PROT 6.6  ALBUMIN 3.4*   Lab Results  Component Value Date   CALCIUM 8.7 (L) 05/15/2019   PHOS 2.9 06/08/2018      WBC      Component Value Date/Time   WBC 6.4 05/15/2019 2111   ANC    Component Value Date/Time   NEUTROABS 4.5 05/15/2019 2111   ALC No components found for: LYMPHAB    Plt: Lab Results  Component Value Date   PLT 283 05/15/2019    Lactic Acid, Venous    Component Value Date/Time   LATICACIDVEN 1.1 06/08/2018 1752    Procalcitonin <0.1   COVID-19 Labs  Recent Labs    05/15/19 2306  LDH 175    No results found for: SARSCOV2NAA  ABG    Component Value Date/Time   PHART 7.454 (H) 05/15/2019 2246   PCO2ART 40.6 05/15/2019 2246   PO2ART 165.0 (H) 05/15/2019 2246   HCO3 28.4 (H) 05/15/2019 2246   TCO2 30 05/15/2019 2246   O2SAT 100.0 05/15/2019 2246        HG/HCT  stable,      Component Value Date/Time   HGB 13.3 05/15/2019 2246   HCT 39.0 05/15/2019 2246    Troponin  ordered     ECG: Ordered Personally reviewed by me showing: HR : 103 Rhythm: Sinus tachycardia    no evidence of ischemic changes QTC 427   BNP (last 3 results) Recent Labs    06/08/18 2022  BNP 52.8      UA   not ordered  CXR - NON acute   CTA chest -   Ordered     ED Triage Vitals  Enc Vitals Group     BP 05/15/19 1951 94/65     Pulse Rate 05/15/19 1951 (!) 106     Resp 05/15/19 1951 20     Temp 05/15/19 1951 99.1 F (37.3 C)     Temp Source 05/15/19 1951 Oral     SpO2 05/15/19 1951 (!) 88 %     Weight --      Height --      Head Circumference --      Peak Flow --      Pain Score 05/15/19 2057 0     Pain Loc --      Pain Edu? --      Excl. in [place]? --   TMAX(24)@       Latest  Blood pressure (!) 102/59, pulse 80, temperature 99.1 F (37.3 C), temperature source Oral, resp. rate (!) 23, SpO2 95 %.     Hospitalist was called for admission for COVID infection   Review of Systems:    Pertinent positives include: Fevers, chills, fatigue, abdominal pain, nausea, vomiting, diarrhea, shortness of breath at rest.   dyspnea on exertion  productive cough, Constitutional:  No weight loss, night sweats,  weight loss  HEENT:  No headaches, Difficulty swallowing,Tooth/dental problems,Sore throat,  No sneezing, itching, ear ache, nasal congestion, post nasal drip,  Cardio-vascular:  No chest pain, Orthopnea, PND, anasarca, dizziness, palpitations.no Bilateral lower extremity swelling  GI:  No heartburn, indigestion,  change in bowel habits, loss of appetite, melena, blood in stool, hematemesis Resp:   No excess mucus,   No coughing up of blood.No change in color of mucus.No wheezing. Skin:  no rash or lesions. No jaundice GU:  no dysuria, change in color of urine, no urgency or frequency. No straining to urinate.  No flank pain.   Musculoskeletal:  No joint pain or no joint swelling. No decreased range of motion. No back pain.  Psych:  No change in mood or affect. No depression or anxiety. No memory loss.  Neuro: no localizing neurological complaints, no tingling, no weakness, no double vision, no gait abnormality, no slurred speech, no confusion  All systems reviewed and apart from [place] all are negative  Past Medical History:   Past Medical History:  Diagnosis Date  . ACID REFLUX DISEASE 05/15/2007   Qualifier: Diagnosis of  By: [name] MD, Christena Deem   . Acute and chronic respiratory failure with hypoxia ([place])   . Acute on chronic respiratory failure with hypoxia ([place]) 06/08/2018  . Acute respiratory failure with hypercapnia ([place])   . Acute respiratory failure with hypoxia ([place]) 07/29/2017  . Anemia 05/20/2016  . Arthritis   . Bladder neoplasm   . Body mass index 34.0-34.9, adult   . Bronchitis 07/06/2011  . Chest pain 07/06/2011  . Chronic cough   . Chronic respiratory failure ([place])   . Community acquired pneumonia   . Constipation 07/03/2011  .  COPD (chronic obstructive pulmonary disease) (HCC)    PULMOLOGIST-  DR [name]--  GOLD III W/  CHRONIC RESPIRATORY FAILURE--  O2 DEPENDENT  . COPD with acute exacerbation ([place]) 06/29/2011  . Coronary arteriosclerosis   . Cough productive of purulent sputum   . Depression   . Dyslipidemia   . Falls frequently   . Fibromyalgia   . GERD (gastroesophageal reflux disease)   . Hearing loss in right ear   . Hematuria   . History of nephritis    as child dx w/ Bright's disease (glomerulonephritis)  . HLD (hyperlipidemia) 05/15/2007   Qualifier: Diagnosis of  By: [name] MD, Christena Deem   . Hyperglycemia 05/20/2016  . Hyperlipidemia   . Hypomagnesemia   . Hypoxemia   . Iron deficiency anemia, unspecified   . Leukopenia   . Low vitamin D level   . Mild obstructive sleep apnea    no cpap recommendation  . Neuropathy 07/29/2017  . Neuropathy, peripheral, idiopathic   . Osteopenia    . Other allergic rhinitis   . Pelvic pain in female   . Polyarthralgia   . PONV (postoperative nausea and vomiting)   . Pre-diabetes   . Rheumatoid arthritis ([place]) 07/29/2017  . Shoulder impingement syndrome 03/25/2015  . Sleep disorder, unspecified   . Spinal stenosis   . Stage 3 severe COPD by GOLD classification ([place]) 06/19/2007   Followed in Pulmonary clinic/ [facility]/ [place]  - hfa 90% 05/15/2012  - PFTs 05/15/2012 FEV1  0.61 ( 36%) ratio 57 and 21% better p B2 and DLCO 44%  106 corrected  - Referred to [facility] 05/15/12 - Rec pulmonary f/u prn 02/11/13    . Supplemental oxygen dependent    2L  via Nasal Canula --  activity and nighttime  . Urinary incontinence in female       Past Surgical History:  Procedure Laterality Date  . APPENDECTOMY  1971  . CARDIAC CATHETERIZATION  05-08-2007  dr Marlou Porch   minor coronary plaquing w/ no significant CAD/  20% mLAD,  10% mRCA,  perserved LV, ef 60-65%  . CATARACT EXTRACTION W/ INTRAOCULAR LENS  IMPLANT, BILATERAL  2001 approx  . CHOLECYSTECTOMY  1988  . CYSTOSCOPY W/ RETROGRADES Bilateral 09/24/2014   Procedure: CYSTOSCOPY WITH RETROGRADE PYELOGRAM;  Surgeon: Festus Aloe, MD;  Location: [facility];  Service: Urology;  Laterality: Bilateral;  . CYSTOSCOPY WITH BIOPSY N/A 09/24/2014   Procedure: CYSTOSCOPY WITH BIOPSY;  Surgeon: Festus Aloe, MD;  Location[facility];  Service: Urology;  Laterality: N/A;  . ESOPHAGOGASTRODUODENOSCOPY  last one 09-09-2008  . FULGURATION OF BLADDER TUMOR N/A 09/24/2014   Procedure: FULGURATION OF BLADDER TUMOR;  Surgeon: Festus Aloe, MD;  Location: [facility];  Service: Urology;  Laterality: N/A;  . [place]  . POSTERIOR LUMBAR FUSION  10-07-2007   bilateral laminectomy L4 and bilateral L4-5 diskectomy w/ fusion  . TONSILLECTOMY  as child  . TOTAL ABDOMINAL HYSTERECTOMY W/ BILATERAL SALPINGOOPHORECTOMY  1973  . TRANSTHORACIC  ECHOCARDIOGRAM  07-07-2011   normal echo,  ef 65-70%    Social History:  Ambulatory  walker       reports that she quit smoking about 7 years ago. Her smoking use included cigarettes. She has a 180.00 pack-year smoking history. She has never used smokeless tobacco. She reports that she does not drink alcohol or use drugs.     Family History:   Family History  Problem Relation Age of Onset  .  Asthma Maternal Grandmother   . Breast cancer Maternal Grandmother     Allergies: Allergies  Allergen Reactions  . Kapidex [Dexlansoprazole] Other (See Comments)    Stomach pain, diarrhea  . Zegerid [Omeprazole] Other (See Comments)    Burning in chest     Prior to Admission medications   Medication Sig Start Date End Date Taking? Authorizing Provider  acetaminophen (TYLENOL) 325 MG tablet Take 2 tablets (650 mg total) by mouth every 6 (six) hours as needed for mild pain (or Fever >/= 101). 08/02/17   Emokpae, Courage, MD  albuterol (PROVENTIL HFA;VENTOLIN HFA) 108 (90 BASE) MCG/ACT inhaler Inhale 2 puffs into the lungs every 6 (six) hours as needed. For shortness of breath.    [provider]  albuterol (PROVENTIL) (2.5 MG/3ML) 0.083% nebulizer solution Take 3 mLs (2.5 mg total) by nebulization every 4 (four) hours as needed for wheezing or shortness of breath. For shortness of breath. 07/28/17   Roxan Hockey, MD  aspirin EC 81 MG tablet Take 81 mg by mouth daily.    [provider]  atorvastatin (LIPITOR) 40 MG tablet Take 40 mg by mouth every morning.     [provider]  azelastine (ASTELIN) 0.1 % nasal spray Place 1 spray into the nose 2 (two) times daily.    [provider]  budesonide-formoterol (SYMBICORT) 160-4.5 MCG/ACT inhaler Inhale 2 puffs into the lungs 2 (two) times daily. 08/02/17   Roxan Hockey, MD  Calcium Carbonate-Vitamin D (CALCIUM + D PO) Take 2 tablets by mouth daily.     [provider]  calcium-vitamin D (OSCAL WITH  D) 500-200 MG-UNIT tablet Take 2 tablets by mouth daily with breakfast. 06/11/18   Georgette Shell, MD  carboxymethylcellulose (REFRESH PLUS) 0.5 % SOLN 2 drops 2 (two) times daily as needed (dry eye).    [provider]  cefdinir (OMNICEF) 300 MG capsule Take 1 capsule (300 mg total) by mouth 2 (two) times daily. Patient not taking: Reported on 07/04/2018 06/10/18   Georgette Shell, MD  cetirizine (ZYRTEC) 10 MG tablet Take 1 tablet (10 mg total) by mouth every morning. 08/02/17   Roxan Hockey, MD  Cholecalciferol (VITAMIN D) 2000 UNITS tablet Take 4,000 Units by mouth daily.     [provider]  cyanocobalamin 1000 MCG tablet Take 1 tablet (1,000 mcg total) by mouth daily. 08/02/17   Roxan Hockey, MD  doxycycline (VIBRA-TABS) 100 MG tablet Take 1 tablet (100 mg total) by mouth every 12 (twelve) hours. Patient not taking: Reported on 07/04/2018 06/10/18   Georgette Shell, MD  esomeprazole (NEXIUM) 40 MG capsule Take 1 capsule (40 mg total) by mouth every morning. 08/02/17   Roxan Hockey, MD  folic acid (FOLVITE) 1 MG tablet Take 1 tablet (1 mg total) by mouth daily. 08/02/17   Roxan Hockey, MD  gabapentin (NEURONTIN) 100 MG capsule Take 2-3 capsules (200-300 mg total) by mouth 3 (three) times daily. 200 mg every AM and at noon and 300 mg QHS 08/02/17   Roxan Hockey, MD  hydroxychloroquine (PLAQUENIL) 200 MG tablet Take 200 mg by mouth daily.    [provider]  ipratropium-albuterol (DUONEB) 0.5-2.5 (3) MG/3ML SOLN Take 3 mLs by nebulization 2 (two) times daily.    [provider]  meloxicam (MOBIC) 15 MG tablet Take 15 mg by mouth daily. 04/24/18   [provider]  methotrexate (RHEUMATREX) 2.5 MG tablet Take 15 mg by mouth once a week.  06/05/17   [provider]  mupirocin ointment (BACTROBAN) 2 % Place 1 application into the nose 2 (two) times daily. 06/10/18   Georgette Shell, MD  polyethylene glycol [facility] /  Floria Raveling) packet Take 17 g by mouth daily. 08/03/17   Roxan Hockey, MD  polyvinyl alcohol (LIQUIFILM TEARS) 1.4 % ophthalmic solution Place 2 drops into both eyes as needed for dry eyes. 08/02/17   Roxan Hockey, MD  predniSONE (DELTASONE) 10 MG tablet Take 30 mg for the first 3 days then 20 mg for the next 3 days then 10 mg daily till done. Patient not taking: Reported on 07/04/2018 06/10/18   Georgette Shell, MD  sertraline (ZOLOFT) 100 MG tablet Take 100 mg by mouth daily.    [provider]  tiotropium (SPIRIVA) 18 MCG inhalation capsule Place 1 capsule (18 mcg total) into inhaler and inhale every morning. 08/02/17   Roxan Hockey, MD  vitamin A 10000 UNIT capsule Take 10,000 Units by mouth daily.    [provider]   Physical Exam: Blood pressure (!) 102/59, pulse 80, temperature 99.1 F (37.3 C), temperature source Oral, resp. rate (!) 23, SpO2 95 %. 1. General:  in No  Acute distress    Chronically ill  -appearing 2. Psychological: Alert and  Oriented 3. Head/ENT:   Dry Mucous Membranes                          Head Non traumatic, neck supple                            Poor Dentition 4. SKIN:  decreased Skin turgor,  Skin clean Dry and intact no rash 5. Heart: Regular rate and rhythm no  Murmur, no Rub or gallop 6. Lungs: no wheezes or crackles   7. Abdomen: Soft,   non-tender, Non distended bowel sounds present 8. Lower extremities: no clubbing, cyanosis, no  edema 9. Neurologically Grossly intact, moving all 4 extremities equally   10. MSK: Normal range of motion   All other LABS:     Recent Labs  Lab 05/15/19 2111 05/15/19 2246  WBC 6.4  --   NEUTROABS 4.5  --   HGB 13.8 13.3  HCT 43.3 39.0  MCV 92.3  --   PLT 283  --      Recent Labs  Lab 05/15/19 2111 05/15/19 2246  NA 134* 134*  K 3.5 3.3*  CL 94*  --   CO2 28  --   GLUCOSE 110*  --   BUN 13  --   CREATININE 0.94  --   CALCIUM 8.7*  --      Recent Labs  Lab  05/15/19 2111  AST 21  ALT 14  ALKPHOS 61  BILITOT 0.4  PROT 6.6  ALBUMIN 3.4*       Cultures:    Component Value Date/Time   SDES BLOOD BLOOD LEFT FOREARM 06/08/2018 1639   SPECREQUEST  06/08/2018 1639    BOTTLES DRAWN AEROBIC AND ANAEROBIC Blood Culture adequate volume Performed at [facility], [place] [address], [place], [place] [address]    CULT NO GROWTH 5 DAYS 06/08/2018 1639   REPTSTATUS 06/13/2018 FINAL 06/08/2018 1639     Radiological Exams on Admission: DG Chest Portable 1 View  Result Date: 05/15/2019 CLINICAL DATA:  COVID positive, short of breath EXAM: PORTABLE CHEST 1 VIEW COMPARISON:  06/08/2018 FINDINGS: The heart size and mediastinal contours  are within normal limits. Aortic atherosclerosis. Both lungs are clear. The visualized skeletal structures are unremarkable. IMPRESSION: No active disease. Electronically Signed   By: Donavan Foil M.D.   On: 05/15/2019 22:23    Chart has been reviewed   Assessment/Plan  84 y.o. female with medical history significant of  RA on methotrexate and plaquenil, COPD on 2 L of o2 mild OSA not on PAPtherapy, CAD and GERD Admitted for Covid infection and acute on chronic respiratory failure  Present on Admission: . COVID-19 virus infection -   FROM HOME   WITH KNOWN HX OF COVID19 ER Novel Corona Virus testing:  Ordered 05/15/19 and is  positive   Following concerning LAB/ imaging findings:    CRP, LDH: increased  IL-6 and Ferritin increased   Procalcitonin: low       -Following work-up initiated:      sputum cultures  Ordered 05/16/19,   CTA ordered   Following complications noted:  nausea/vomiting Diarrhea , decreased PO intake resulting in dehydration - will rehydrate   acute respiratory failure with hypoxia - continue oxygen treatment  Plan of treatment: - Transfer to [facility] facility        -given severity of illness initiate steroids Decadron 28m q 24 hours And pharmacy consult for  remdesivir   - Will follow daily d.dimer - Assess for ability to prone  - Supportive management -Fluid sparing resuscitation  -Provide oxygen as needed currently on   SpO2: 95 % O2 Flow Rate (L/min): 3 L/min - IF d.dimer elvated >5 will increase dose of lovenox   Poor Prognostic factors  84y.o.  Personal hx of   CAD, COPD, HTN, obesity      Will order Airborne and Contact precautions  Family/ patient prognosis discussion: I have discussed case with the patient  who are aware of their prognosis At this point they would like  to be full code     . HLD (hyperlipidemia) -stable continue home medications  . Stage 3 severe COPD by GOLD classification ([place] -chronic continue home medications  . Rheumatoid arthritis ([place] -continue Plaquenil hold off on methotrexate for tonight Unclear if would be beneficial to continue or to hold in this case  . Acute on chronic respiratory failure with hypoxia (HCC) continue oxygen If progressively gets worse consider Actemra Await results of CTA to evaluate for possible PE  Other plan as per orders.  DVT prophylaxis:    Lovenox     Code Status:  FULL CODE  as per patient   I had personally discussed CODE STATUS with patient   Family Communication:   Family not at  Bedside     Disposition Plan:     To home once workup is complete and patient is stable                      Consults called: none  Admission status:  ED Disposition    ED Disposition Condition Comment   Admit  The patient appears reasonably stabilized for admission considering the current resources, flow, and capabilities available in the ED at this time, and I doubt any other [facility] screening and/or treatment in the ED prior to admission is  present.        inpatient     Expect 2 midnight stay secondary to severity of patient's current illness including   hemodynamic instability despite optimal treatment ( hypoxia )  Severe lab/radiological/exam  abnormalities including:  COVID infection  and extensive comorbidities including:   CAD   COPD/asthma  Obesity   That are currently affecting medical management.   I expect  patient to be hospitalized for 2 midnights requiring inpatient medical care.  Patient is at high risk for adverse outcome (such as loss of life or disability) if not treated.  Indication for inpatient stay as follows:  Severe change from baseline regarding mental status Hemodynamic instability despite maximal medical therapy,    New or worsening hypoxia  Need for IV antivirals, Iv steroids,      Level of care    tele  For  24H     Precautions: admitted as   covid positive Airborne and Contact precautions    PPE: Used by the provider:   P100  eye Goggles,  Gloves  gown   Poonam Woehrle 05/15/2019, 1:44 AM    [facility]     after 2 AM please page floor coverage PA If 7AM-7PM, please contact the day team taking care of the patient using [email or web address]   Patient was evaluated in the context of the global COVID-19 pandemic, which necessitated consideration that the patient might be at risk for infection with the SARS-CoV-2 virus that causes COVID-19. Institutional protocols and algorithms that pertain to the evaluation of patients at risk for COVID-19 are in a state of rapid change based on information released by regulatory bodies including the CDC and federal and state organizations. These policies and algorithms were followed during the patient's care.

## 2019-05-15 NOTE — ED Triage Notes (Signed)
Patient reports COVID positive test result this week , patient stated fever with chills,SOB , productive cough , fatigue , loss of taste and smell onset last week .

## 2019-05-15 NOTE — Telephone Encounter (Signed)
Pt.'s pastor calling for advise. Pt. Has "numerous health issues, uses oxygen at home." Having shortness of breath and fever. Has known COVID 19 exposure 12 days ago.Asking if she should be seen. Instructed to take pt. To ED for evaluation. Verbalizes understanding.  Answer Assessment - Initial Assessment Questions 1. COVID-19 DIAGNOSIS: "Who made your Coronavirus (COVID-19) diagnosis?" "Was it confirmed by a positive lab test?" If not diagnosed by a HCP, ask "Are there lots of cases (community spread) where you live?" (See public health department website, if unsure)     No diagnosis 2. COVID-19 EXPOSURE: "Was there any known exposure to COVID before the symptoms began?" CDC Definition of close contact: within 6 feet (2 meters) for a total of 15 minutes or more over a 24-hour period.      Yes - 12 days ago 3. ONSET: "When did the COVID-19 symptoms start?"      Several days 4. WORST SYMPTOM: "What is your worst symptom?" (e.g., cough, fever, shortness of breath, muscle aches)     Shortness of breath - uses O2 at home 5. COUGH: "Do you have a cough?" If so, ask: "How bad is the cough?"       Yes 6. FEVER: "Do you have a fever?" If so, ask: "What is your temperature, how was it measured, and when did it start?"     Yes 7. RESPIRATORY STATUS: "Describe your breathing?" (e.g., shortness of breath, wheezing, unable to speak)      Shortness 8. BETTER-SAME-WORSE: "Are you getting better, staying the same or getting worse compared to yesterday?"  If getting worse, ask, "In what way?"     Worse 9. HIGH RISK DISEASE: "Do you have any chronic medical problems?" (e.g., asthma, heart or lung disease, weak immune system, obesity, etc.)     Yes 10. PREGNANCY: "Is there any chance you are pregnant?" "When was your last menstrual period?"       No 11. OTHER SYMPTOMS: "Do you have any other symptoms?"  (e.g., chills, fatigue, headache, loss of smell or taste, muscle pain, sore throat; new loss of smell or taste  especially support the diagnosis of COVID-19)       Fever, shortness of breath  Protocols used: CORONAVIRUS (COVID-19) DIAGNOSED OR SUSPECTED-A-AH

## 2019-05-16 ENCOUNTER — Inpatient Hospital Stay (HOSPITAL_COMMUNITY): Payer: Medicare Other

## 2019-05-16 DIAGNOSIS — Z791 Long term (current) use of non-steroidal anti-inflammatories (NSAID): Secondary | ICD-10-CM | POA: Diagnosis not present

## 2019-05-16 DIAGNOSIS — U071 COVID-19: Secondary | ICD-10-CM | POA: Diagnosis present

## 2019-05-16 DIAGNOSIS — R7303 Prediabetes: Secondary | ICD-10-CM | POA: Diagnosis present

## 2019-05-16 DIAGNOSIS — I5032 Chronic diastolic (congestive) heart failure: Secondary | ICD-10-CM | POA: Diagnosis present

## 2019-05-16 DIAGNOSIS — Z9049 Acquired absence of other specified parts of digestive tract: Secondary | ICD-10-CM | POA: Diagnosis not present

## 2019-05-16 DIAGNOSIS — M797 Fibromyalgia: Secondary | ICD-10-CM | POA: Diagnosis present

## 2019-05-16 DIAGNOSIS — J9621 Acute and chronic respiratory failure with hypoxia: Secondary | ICD-10-CM | POA: Diagnosis present

## 2019-05-16 DIAGNOSIS — I2510 Atherosclerotic heart disease of native coronary artery without angina pectoris: Secondary | ICD-10-CM | POA: Diagnosis present

## 2019-05-16 DIAGNOSIS — E860 Dehydration: Secondary | ICD-10-CM | POA: Diagnosis present

## 2019-05-16 DIAGNOSIS — Z87891 Personal history of nicotine dependence: Secondary | ICD-10-CM | POA: Diagnosis not present

## 2019-05-16 DIAGNOSIS — R0602 Shortness of breath: Secondary | ICD-10-CM | POA: Diagnosis not present

## 2019-05-16 DIAGNOSIS — H9191 Unspecified hearing loss, right ear: Secondary | ICD-10-CM | POA: Diagnosis present

## 2019-05-16 DIAGNOSIS — M069 Rheumatoid arthritis, unspecified: Secondary | ICD-10-CM | POA: Diagnosis present

## 2019-05-16 DIAGNOSIS — F329 Major depressive disorder, single episode, unspecified: Secondary | ICD-10-CM | POA: Diagnosis present

## 2019-05-16 DIAGNOSIS — K219 Gastro-esophageal reflux disease without esophagitis: Secondary | ICD-10-CM | POA: Diagnosis present

## 2019-05-16 DIAGNOSIS — G4733 Obstructive sleep apnea (adult) (pediatric): Secondary | ICD-10-CM | POA: Diagnosis present

## 2019-05-16 DIAGNOSIS — Z79899 Other long term (current) drug therapy: Secondary | ICD-10-CM | POA: Diagnosis not present

## 2019-05-16 DIAGNOSIS — Z7982 Long term (current) use of aspirin: Secondary | ICD-10-CM | POA: Diagnosis not present

## 2019-05-16 DIAGNOSIS — Z9181 History of falling: Secondary | ICD-10-CM | POA: Diagnosis not present

## 2019-05-16 DIAGNOSIS — E669 Obesity, unspecified: Secondary | ICD-10-CM | POA: Diagnosis present

## 2019-05-16 DIAGNOSIS — Z7951 Long term (current) use of inhaled steroids: Secondary | ICD-10-CM | POA: Diagnosis not present

## 2019-05-16 DIAGNOSIS — J449 Chronic obstructive pulmonary disease, unspecified: Secondary | ICD-10-CM | POA: Diagnosis present

## 2019-05-16 DIAGNOSIS — Z9981 Dependence on supplemental oxygen: Secondary | ICD-10-CM | POA: Diagnosis not present

## 2019-05-16 DIAGNOSIS — R0902 Hypoxemia: Secondary | ICD-10-CM | POA: Diagnosis not present

## 2019-05-16 DIAGNOSIS — E785 Hyperlipidemia, unspecified: Secondary | ICD-10-CM | POA: Diagnosis present

## 2019-05-16 LAB — C-REACTIVE PROTEIN: CRP: 2.7 mg/dL — ABNORMAL HIGH (ref <1.0)

## 2019-05-16 LAB — D-DIMER, QUANTITATIVE: D-Dimer, Quant: 1.67 ug/mL-FEU — ABNORMAL HIGH (ref 0.00–0.50)

## 2019-05-16 LAB — PROCALCITONIN: Procalcitonin: <0.1 ng/mL

## 2019-05-16 LAB — TROPONIN I (HIGH SENSITIVITY)
Troponin I (High Sensitivity): 9 ng/L (ref <18)
Troponin I (High Sensitivity): 8 ng/L (ref <18)

## 2019-05-16 LAB — POC SARS CORONAVIRUS 2 AG -  ED: SARS Coronavirus 2 Ag: POSITIVE — AB

## 2019-05-16 LAB — GLUCOSE, CAPILLARY
Glucose-Capillary: 111 mg/dL — ABNORMAL HIGH (ref 70–99)
Glucose-Capillary: 112 mg/dL — ABNORMAL HIGH (ref 70–99)

## 2019-05-16 LAB — FERRITIN: Ferritin: 76 ng/mL (ref 11–307)

## 2019-05-16 LAB — FIBRINOGEN: Fibrinogen: 532 mg/dL — ABNORMAL HIGH (ref 210–475)

## 2019-05-16 LAB — TRIGLYCERIDES: Triglycerides: 151 mg/dL — ABNORMAL HIGH (ref <150)

## 2019-05-16 MED ORDER — MOMETASONE FURO-FORMOTEROL FUM 200-5 MCG/ACT IN AERO
2.0000 | INHALATION_SPRAY | Freq: Two times a day (BID) | RESPIRATORY_TRACT | Status: DC
  Administered 2019-05-16 – 2019-05-18 (×3): 2 via RESPIRATORY_TRACT
  Filled 2019-05-16 (×2): qty 8.8

## 2019-05-16 MED ORDER — INSULIN ASPART 100 UNIT/ML ~~LOC~~ SOLN
0.0000 [IU] | Freq: Three times a day (TID) | SUBCUTANEOUS | Status: DC
  Administered 2019-05-17 – 2019-05-18 (×3): 1 [IU] via SUBCUTANEOUS

## 2019-05-16 MED ORDER — GABAPENTIN 100 MG PO CAPS
200.0000 mg | ORAL_CAPSULE | Freq: Three times a day (TID) | ORAL | Status: DC
  Administered 2019-05-16: 200 mg via ORAL
  Filled 2019-05-16: qty 2

## 2019-05-16 MED ORDER — IOHEXOL 350 MG/ML SOLN
100.0000 mL | Freq: Once | INTRAVENOUS | Status: AC | PRN
  Administered 2019-05-16: 100 mL via INTRAVENOUS

## 2019-05-16 MED ORDER — ONDANSETRON HCL 4 MG/2ML IJ SOLN: 4.0000 mg | Freq: Four times a day (QID) | INTRAMUSCULAR | Status: DC | PRN

## 2019-05-16 MED ORDER — SODIUM CHLORIDE 0.9% FLUSH
3.0000 mL | Freq: Two times a day (BID) | INTRAVENOUS | Status: DC
  Administered 2019-05-16 – 2019-05-18 (×4): 3 mL via INTRAVENOUS

## 2019-05-16 MED ORDER — GABAPENTIN 300 MG PO CAPS
300.0000 mg | ORAL_CAPSULE | Freq: Every day | ORAL | Status: DC
  Administered 2019-05-16 – 2019-05-17 (×2): 300 mg via ORAL
  Filled 2019-05-16 (×2): qty 1

## 2019-05-16 MED ORDER — DEXAMETHASONE SODIUM PHOSPHATE 10 MG/ML IJ SOLN
6.0000 mg | INTRAMUSCULAR | Status: DC
  Administered 2019-05-16 – 2019-05-17 (×2): 6 mg via INTRAVENOUS
  Filled 2019-05-16 (×2): qty 1

## 2019-05-16 MED ORDER — ORAL CARE MOUTH RINSE
15.0000 mL | Freq: Two times a day (BID) | OROMUCOSAL | Status: DC
  Administered 2019-05-16 – 2019-05-18 (×4): 15 mL via OROMUCOSAL

## 2019-05-16 MED ORDER — GABAPENTIN 100 MG PO CAPS
200.0000 mg | ORAL_CAPSULE | Freq: Two times a day (BID) | ORAL | Status: DC
  Administered 2019-05-16 – 2019-05-18 (×5): 200 mg via ORAL
  Filled 2019-05-16 (×5): qty 2

## 2019-05-16 MED ORDER — SERTRALINE HCL 50 MG PO TABS
100.0000 mg | ORAL_TABLET | Freq: Every day | ORAL | Status: DC
  Administered 2019-05-16 – 2019-05-18 (×3): 100 mg via ORAL
  Filled 2019-05-16 (×2): qty 2
  Filled 2019-05-16: qty 1

## 2019-05-16 MED ORDER — ENOXAPARIN SODIUM 40 MG/0.4ML ~~LOC~~ SOLN
40.0000 mg | SUBCUTANEOUS | Status: DC
  Administered 2019-05-16 – 2019-05-18 (×3): 40 mg via SUBCUTANEOUS
  Filled 2019-05-16 (×3): qty 0.4

## 2019-05-16 MED ORDER — LORATADINE 10 MG PO TABS
10.0000 mg | ORAL_TABLET | Freq: Every day | ORAL | Status: DC
  Administered 2019-05-16 – 2019-05-18 (×3): 10 mg via ORAL
  Filled 2019-05-16 (×3): qty 1

## 2019-05-16 MED ORDER — SODIUM CHLORIDE 0.9 % IV SOLN: INTRAVENOUS | Status: AC

## 2019-05-16 MED ORDER — HYDROCODONE-ACETAMINOPHEN 5-325 MG PO TABS: 1.0000 | ORAL_TABLET | ORAL | Status: DC | PRN

## 2019-05-16 MED ORDER — GUAIFENESIN-DM 100-10 MG/5ML PO SYRP: 10.0000 mL | ORAL_SOLUTION | ORAL | Status: DC | PRN

## 2019-05-16 MED ORDER — PANTOPRAZOLE SODIUM 40 MG PO TBEC
40.0000 mg | DELAYED_RELEASE_TABLET | Freq: Every day | ORAL | Status: DC
  Administered 2019-05-16 – 2019-05-18 (×3): 40 mg via ORAL
  Filled 2019-05-16 (×3): qty 1

## 2019-05-16 MED ORDER — ONDANSETRON HCL 4 MG PO TABS
4.0000 mg | ORAL_TABLET | Freq: Four times a day (QID) | ORAL | Status: DC | PRN
  Filled 2019-05-16: qty 1

## 2019-05-16 MED ORDER — ATORVASTATIN CALCIUM 40 MG PO TABS
40.0000 mg | ORAL_TABLET | Freq: Every morning | ORAL | Status: DC
  Administered 2019-05-16 – 2019-05-18 (×3): 40 mg via ORAL
  Filled 2019-05-16 (×3): qty 1

## 2019-05-16 MED ORDER — ALBUTEROL SULFATE HFA 108 (90 BASE) MCG/ACT IN AERS
2.0000 | INHALATION_SPRAY | Freq: Four times a day (QID) | RESPIRATORY_TRACT | Status: DC | PRN
  Filled 2019-05-16: qty 6.7

## 2019-05-16 MED ORDER — HYDROXYCHLOROQUINE SULFATE 200 MG PO TABS
200.0000 mg | ORAL_TABLET | Freq: Every day | ORAL | Status: DC
  Administered 2019-05-16 – 2019-05-18 (×3): 200 mg via ORAL
  Filled 2019-05-16 (×4): qty 1

## 2019-05-16 MED ORDER — ACETAMINOPHEN 325 MG PO TABS
650.0000 mg | ORAL_TABLET | Freq: Four times a day (QID) | ORAL | Status: DC | PRN
  Administered 2019-05-17: 650 mg via ORAL
  Filled 2019-05-16: qty 2

## 2019-05-16 MED ORDER — INSULIN ASPART 100 UNIT/ML ~~LOC~~ SOLN: 0.0000 [IU] | Freq: Every day | SUBCUTANEOUS | Status: DC

## 2019-05-16 NOTE — ED Notes (Signed)
[facility] here to transport pt to [facility]- reports given to receiving RN ; and made aware pt is on the way.

## 2019-05-16 NOTE — ED Notes (Signed)
Tele   Breakfast ordered  

## 2019-05-16 NOTE — ED Notes (Signed)
+  Tele  Called [facility] for pt

## 2019-05-16 NOTE — ED Notes (Signed)
REPORT POC COVID TEST IS POSTIVE TO [name] [facility] PA.AND NURSE KELLY G.RN

## 2019-05-16 NOTE — ED Notes (Signed)
This nurse reached out to [facility] r/t transport to [facility]- informed ETA aprox 2 hours.

## 2019-05-16 NOTE — Progress Notes (Signed)
PROGRESS NOTE  Brief Narrative: [name] is an 84 y.o. female with 2L-O2-dependent COPD, OSA not on CPAP, CAD, and RA on hydroxychloroquine and methotrexate who presented to the ED 1/22 having recently tested positive for covid-19 and having progressive fatigue/weakness and worsening exertional dyspnea requiring more home oxygen than baseline. Work up was largely reassuring with no infiltrates on CXR, ththough she was requiring 4L O2 at rest to maintain SpO2 >90%.   Subjective: Feels drained, tired, generally weak. No focal weakness/numbness. At rest, denies shortness of breath. No chest pain. Feels somewhat better from admission.  Objective: BP (!) 121/54   Pulse (!) 58   Temp 99.1 F (37.3 C) (Oral)   Resp 20   SpO2 98%   Gen: Elderly, pleasant female in no distress Pulm: Diminished and nonlabored on 3L O2  CV: RRR, no murmur, no JVD, no edema GI: Soft, NT, ND, +BS  Neuro: Alert and oriented. No focal deficits. Skin: No rashes, lesions or ulcers  Assessment & Plan: Active Problems:   HLD (hyperlipidemia)   Stage 3 severe COPD by GOLD classification (HCC)   Rheumatoid arthritis (HCC)   Acute on chronic respiratory failure with hypoxia (HCC)   COVID-19 virus infection  Acute on chronic hypoxemic respiratory failure and severe fatigue, muscular deconditioning due to covid-19 infection on COPD without exacerbation: SARS-CoV-2 antigen is positive on admission. CXR reassuring, though inflammatory markers elevated and with hypoxemia may turn out infiltrates with time. No evidence of consolidation or PE on CTA chest. PCT negative. ECG nonischemic, troponin normal. - Continue supplemental oxygen to maintain SpO2 88-95%, aim to wean to 2L O2. - Continue home bronchodilator therapies - Continue remdesivir x5 days 1/22 - 1/26 - Steroids x10 days - Vitamin C, zinc - Encourage OOB, IS, FV, and awake proning if able - Tylenol and antitussives prn - Continue airborne, contact  precautions while admitted. Isolation period would be recommended for 21 days from positive testing. - Check CBC w/diff, CMP, CRP, d-dimer daily - Enoxaparin prophylactic dose.  - Maintain euvolemia/net negative.  - Avoid NSAIDs  - At very high risk of deconditioning given age, comorbidities including RA, baseline frailty/history of frequent falls, and infection. Needs PT/OT early and often.  Rheumatoid arthritis: Quiescent. - Continue home hydroxychloroquine, consider holding methotrexate/folate  History of prediabetes: Last HbA1c 6.3% - SSI to maintain tight glycemic control while on steroids  Dyslipidemia:  - Continue statin  GERD:  - Continue PPI  Depression:  - Continue SSRI  Patrecia Pour, MD Pager on amion 05/16/2019, 2:12 PM

## 2019-05-16 NOTE — Progress Notes (Signed)
Called and updated the pt's sister, Zella Ball. I explained the communication policy for a pt admitted to [facility].  Zella Ball knows to expect one phone call update per shift; she also knows that she is the point of contact for her sister, and that all communication with the pt's care team from the pt's family should come from her and be addressed during the phone call updates for each shift. There are no ACP documents on file. I explained to Zella Ball that although she is the pt's PoC for this admission; decision making would fall to the pt's 3 living daughters in the event of the pt being unable to make decisions for herself. These daughters are in [place], [place], and [place].

## 2019-05-17 LAB — COMPREHENSIVE METABOLIC PANEL
ALT: 12 U/L (ref 0–44)
AST: 18 U/L (ref 15–41)
Albumin: 3.1 g/dL — ABNORMAL LOW (ref 3.5–5.0)
Alkaline Phosphatase: 50 U/L (ref 38–126)
Anion gap: 11 (ref 5–15)
BUN: 21 mg/dL (ref 8–23)
CO2: 28 mmol/L (ref 22–32)
Calcium: 8.5 mg/dL — ABNORMAL LOW (ref 8.9–10.3)
Chloride: 99 mmol/L (ref 98–111)
Creatinine, Ser: 0.69 mg/dL (ref 0.44–1.00)
GFR calc Af Amer: >60 mL/min (ref >60)
GFR calc non Af Amer: >60 mL/min (ref >60)
Glucose, Bld: 144 mg/dL — ABNORMAL HIGH (ref 70–99)
Potassium: 4 mmol/L (ref 3.5–5.1)
Sodium: 138 mmol/L (ref 135–145)
Total Bilirubin: 0.6 mg/dL (ref 0.3–1.2)
Total Protein: 6.1 g/dL — ABNORMAL LOW (ref 6.5–8.1)

## 2019-05-17 LAB — GLUCOSE, CAPILLARY
Glucose-Capillary: 164 mg/dL — ABNORMAL HIGH (ref 70–99)
Glucose-Capillary: 128 mg/dL — ABNORMAL HIGH (ref 70–99)
Glucose-Capillary: 90 mg/dL (ref 70–99)
Glucose-Capillary: 82 mg/dL (ref 70–99)

## 2019-05-17 LAB — ABO/RH: ABO/RH(D): B NEG

## 2019-05-17 LAB — CBC WITH DIFFERENTIAL/PLATELET
Abs Immature Granulocytes: 0.02 10*3/uL (ref 0.00–0.07)
Basophils Absolute: 0 10*3/uL (ref 0.0–0.1)
Basophils Relative: 0 %
Eosinophils Absolute: 0 10*3/uL (ref 0.0–0.5)
Eosinophils Relative: 0 %
HCT: 38.5 % (ref 36.0–46.0)
Hemoglobin: 12.5 g/dL (ref 12.0–15.0)
Immature Granulocytes: 1 %
Lymphocytes Relative: 16 %
Lymphs Abs: 0.6 10*3/uL — ABNORMAL LOW (ref 0.7–4.0)
MCH: 29.7 pg (ref 26.0–34.0)
MCHC: 32.5 g/dL (ref 30.0–36.0)
MCV: 91.4 fL (ref 80.0–100.0)
Monocytes Absolute: 0.1 10*3/uL (ref 0.1–1.0)
Monocytes Relative: 4 %
Neutro Abs: 3.1 10*3/uL (ref 1.7–7.7)
Neutrophils Relative %: 79 %
Platelets: 292 10*3/uL (ref 150–400)
RBC: 4.21 MIL/uL (ref 3.87–5.11)
RDW: 12.7 % (ref 11.5–15.5)
WBC: 3.9 10*3/uL — ABNORMAL LOW (ref 4.0–10.5)
nRBC: 0 % (ref 0.0–0.2)

## 2019-05-17 LAB — D-DIMER, QUANTITATIVE: D-Dimer, Quant: 1.58 ug/mL-FEU — ABNORMAL HIGH (ref 0.00–0.50)

## 2019-05-17 LAB — HEMOGLOBIN A1C
Hgb A1c MFr Bld: 5.9 % — ABNORMAL HIGH (ref 4.8–5.6)
Mean Plasma Glucose: 122.63 mg/dL

## 2019-05-17 LAB — MAGNESIUM: Magnesium: 1.5 mg/dL — ABNORMAL LOW (ref 1.7–2.4)

## 2019-05-17 LAB — FERRITIN: Ferritin: 77 ng/mL (ref 11–307)

## 2019-05-17 LAB — PHOSPHORUS: Phosphorus: 3.1 mg/dL (ref 2.5–4.6)

## 2019-05-17 LAB — C-REACTIVE PROTEIN: CRP: 1.8 mg/dL — ABNORMAL HIGH (ref <1.0)

## 2019-05-17 MED ORDER — MAGNESIUM SULFATE 2 GM/50ML IV SOLN
2.0000 g | Freq: Once | INTRAVENOUS | Status: AC
  Administered 2019-05-17: 2 g via INTRAVENOUS
  Filled 2019-05-17: qty 50

## 2019-05-17 NOTE — Plan of Care (Signed)
   Vital Signs MEWS/VS Documentation       05/17/2019 1401 05/17/2019 1402 05/17/2019 1710 05/17/2019 2200   MEWS Score:  1  --  0  0   MEWS Score Color:  Green  --  Green  Green   Resp:  (!) 22  --  18  20   Pulse:  81  --  64  65   BP:  111/77  --  131/61  128/70   Temp:  98.2 F (36.8 C)  --  98.6 F (37 C)  98.4 F (36.9 C)   O2 Device:  Room Air  Nasal Cannula  Nasal Cannula  Nasal Cannula   O2 Flow Rate (L/min):  --  --  2 L/min  2 L/min   Level of Consciousness:  Alert  --  Alert  Alert       POC reviewed with pt.     Paticia Stack Paulette Lynch 05/17/2019,10:42 PM

## 2019-05-17 NOTE — Progress Notes (Addendum)
PROGRESS NOTE  Brief Narrative: [name] is an 84 y.o. female with 2L-O2-dependent COPD, OSA not on CPAP, CAD, and RA on hydroxychloroquine and methotrexate who presented to the ED 1/22 having recently tested positive for covid-19 and having progressive fatigue/weakness and worsening exertional dyspnea requiring more home oxygen than baseline. Work up was largely reassuring with no infiltrates on CXR, though she was requiring 4L O2 at rest to maintain SpO2 >90%.   Subjective: Reporting improvement in dyspnea when getting up to [facility], now mild, quickly improves with rest, no chest pain or other concerns. Has come back to home oxygen requirement.  Objective: BP (!) 120/51 (BP Location: Left Arm)   Pulse 60   Temp 98.9 F (37.2 C) (Oral)   Resp 12   SpO2 95%   Gen: Elderly, pleasant female in no distress Pulm: Nonlabored breathing. No crackles or wheezes. CV: Regular rate and rhythm. No murmur, rub, or gallop. No JVD, no dependent edema. GI: Abdomen soft, non-tender, non-distended, with normoactive bowel sounds.  Ext: Warm, no deformities Skin: No rashes, lesions or ulcers on visualized skin. Neuro: Alert and oriented. No focal neurological deficits. Psych: Judgement and insight appear fair. Mood euthymic & affect congruent. Behavior is appropriate.    Assessment & Plan: Principal Problem:   Acute on chronic respiratory failure with hypoxia (HCC) Active Problems:   HLD (hyperlipidemia)   Stage 3 severe COPD by GOLD classification (HCC)   Rheumatoid arthritis (HCC)   COVID-19 virus infection  Acute on chronic hypoxemic respiratory failure and severe fatigue, muscular deconditioning due to covid-19 infection on COPD without exacerbation: SARS-CoV-2 antigen is positive on admission. CXR reassuring, though inflammatory markers elevated and with hypoxemia may turn out infiltrates with time. No evidence of consolidation or PE on CTA chest. PCT negative. ECG nonischemic, troponin  normal. - Continue supplemental oxygen to maintain SpO2 88-95%, aim to wean to 2L O2. - Continue home bronchodilator therapies - Continue remdesivir x5 days 1/22 - 1/26. If remains at baseline oxygen need and functionally able to do well at home based on PT/OT evaluation, would be candidate for discharge 1/25. - Steroids x10 days - Vitamin C, zinc - Encourage OOB, IS, FV, and awake proning if able - Tylenol and antitussives prn - Continue airborne, contact precautions while admitted, up to 21 days from positive testing. - Check CBC w/diff, CMP, CRP, d-dimer daily. CRP declining as expected, still not normal. Ferritin wnl. PCT negative.  - Enoxaparin prophylactic dose.  - Maintain euvolemia/net negative.  - Avoid NSAIDs  - At very high risk of deconditioning given age, comorbidities including RA, baseline frailty/history of frequent falls, and infection. Needs PT/OT early and often.  Hypomagnesemia:  - Supplement and monitor.  Rheumatoid arthritis: Quiescent. - Continue home hydroxychloroquine (QTc 498msec, long term medication, will not continue telemetry as only NSR has been noted on tele and ECG), consider holding methotrexate/folate  Prediabetes: HbA1c 5.9% - SSI to maintain tight glycemic control while on steroids. If no insulin required in 24 hours, can DC checks.  Dyslipidemia:  - Continue statin  GERD:  - Continue PPI  Depression:  - Continue SSRI  Patrecia Pour, MD Pager on amion 05/17/2019, 9:15 AM

## 2019-05-17 NOTE — Progress Notes (Signed)
EKG complete in chart for review

## 2019-05-17 NOTE — Progress Notes (Signed)
Occupational Therapy Evaluation Patient Details Name: [name] MRN: [id number] DOB: 03/17/1936 Today's Date: 05/17/2019    History of Present Illness [name] is an 84 y.o. female with 2L-O2-dependent COPD, OSA not on CPAP, CAD, and RA on hydroxychloroquine and methotrexate who presented to the ED 1/22 having recently tested positive for covid-19 and having progressive fatigue/weakness and worsening exertional dyspnea requiring more home oxygen than baseline. Work up was largely reassuring with no infiltrates on CXR, ththough she was requiring 4L O2 at rest to maintain SpO2 >90%.    Clinical Impression   PTA pt was living with her son in law and granddaughter. Pt reports being independent in ADLs, however requires assist for IADLs. Pt reports occasional use of 4WW in her house and states she's had 5+ falls in the last 6 months. Pt still drives. Pt uses 2L O2 at home and is currently on room air upon OT arrival. Pt currently requires setup to min guard for self-care and functional transfer tasks. Pt able to stand pivot to/from bedside commode and to bedside chair with min guard. Pt declined ambulating to bathroom stating "I only walk very short distances at home. I could probably walk there, but you wouldn't get me out." SpO2 maintained in 90s throughout on room air with RN updated. No reports of shortness of breath throughout. Pt demonstrates decreased strength, endurance, balance, standing tolerance, activity tolerance, and safety awareness impacting ability to complete self-care and functional transfer tasks. Recommend skilled OT services to address above deficits in order to promote function and prevent further decline. Recommend [facility] for continued rehab following hospital discharge.     Follow Up Recommendations  Home health OT;Supervision/Assistance - 24 hour    Equipment Recommendations  Other (comment)(tub transfer bench)    Recommendations for Other Services        Precautions / Restrictions Precautions Precautions: Fall Restrictions Weight Bearing Restrictions: No      Mobility Bed Mobility Overal bed mobility: Needs Assistance Bed Mobility: Supine to Sit     Supine to sit: HOB elevated;Supervision     General bed mobility comments: Use of bedrail  Transfers Overall transfer level: Needs assistance Equipment used: 1 person hand held assist Transfers: Sit to/from [facility] Sit to Stand: Min guard Stand pivot transfers: Min guard       General transfer comment: to ensure balance and safety. Noted 0 instances of LOB, however pt unsteady on feet.    Balance Overall balance assessment: Needs assistance Sitting-balance support: Feet supported Sitting balance-Leahy Scale: Fair       Standing balance-Leahy Scale: Poor                             ADL either performed or assessed with clinical judgement   ADL Overall ADL's : Needs assistance/impaired Eating/Feeding: Independent;Sitting   Grooming: Set up;Supervision/safety;Sitting   Upper Body Bathing: Set up;Supervision/ safety;Sitting   Lower Body Bathing: Supervison/ safety;Min guard;Sit to/from stand   Upper Body Dressing : Set up;Supervision/safety;Sitting   Lower Body Dressing: Supervision/safety;Min guard;Sit to/from stand   Toilet Transfer: Min guard;BSC   Toileting- Water quality scientist and Hygiene: Min guard;Sit to/from stand;Supervision/safety       Functional mobility during ADLs: Min guard General ADL Comments: Pt able to stand pivot to/from [facility] and to bedside chair. Pt declined ambulating into bathroom this date. Pt states "I walk very short distances at home. I could probably walk there, but you wouldn't  get me out."     Vision Baseline Vision/History: Wears glasses Wears Glasses: Reading only       Perception     Praxis      Pertinent Vitals/Pain Pain Assessment: 0-10 Pain Score: 7  Pain Location: neck and  shoulders Pain Descriptors / Indicators: Constant Pain Intervention(s): Limited activity within patient's tolerance;Monitored during session;Repositioned     Hand Dominance Right   Extremity/Trunk Assessment Upper Extremity Assessment Upper Extremity Assessment: Generalized weakness   Lower Extremity Assessment Lower Extremity Assessment: Defer to PT evaluation       Communication Communication Communication: HOH   Cognition Arousal/Alertness: Awake/alert Behavior During Therapy: WFL for tasks assessed/performed Overall Cognitive Status: Within Functional Limits for tasks assessed                                     General Comments  Pt reports using 2L O2 at home at all times, however upon OT arrival pt on room air with SpO2 95%. SpO2 maintained in 90s throughout all tasks on room air.     Exercises     Shoulder Instructions      Home Living Family/patient expects to be discharged to:: Private residence Living Arrangements: Children;Other relatives(son in law and granddaughter) Available Help at Discharge: Family Type of Home: House Home Access: Stairs to enter Technical brewer of Steps: 1   Home Layout: Two level;Bed/bath upstairs Alternate Level Stairs-Number of Steps: (has a stair lift)   Bathroom Shower/Tub: Teacher, early years/pre: Handicapped height     Home Equipment: Clinical cytogeneticist - 4 wheels;Wheelchair - manual   Additional Comments: Pt reports that she mainly stays in her room on the 2nd floor. Pt reports that bathroom is 6 ft from her chair which she is able to manage.       Prior Functioning/Environment Level of Independence: Independent with assistive device(s);Needs assistance  Gait / Transfers Assistance Needed: Pt reports occasional use of 4WW in her house. Pt can only ambulate very short distances due to back pain. Pt reports 5+ falls in the last 6 months.  ADL's / Homemaking Assistance Needed: Pt independent  with ADLs. Pt requires assist with IADLs. Pt still drives. Pt uses 2L of O2 at home.             OT Problem List: Decreased strength;Decreased activity tolerance;Impaired balance (sitting and/or standing);Cardiopulmonary status limiting activity      OT Treatment/Interventions: Self-care/ADL training;Therapeutic exercise;Neuromuscular education;Energy conservation;DME and/or AE instruction;Therapeutic activities;Patient/family education;Balance training    OT Goals(Current goals can be found in the care plan section) Acute Rehab OT Goals Patient Stated Goal: to go home OT Goal Formulation: With patient Time For Goal Achievement: 05/31/19 Potential to Achieve Goals: Good ADL Goals Pt Will Perform Grooming: with modified independence;standing Pt Will Perform Lower Body Bathing: with modified independence;sitting/lateral leans;sit to/from stand Pt Will Perform Lower Body Dressing: with modified independence;sit to/from stand;sitting/lateral leans Pt Will Transfer to Toilet: with modified independence;ambulating;regular height toilet Pt Will Perform Toileting - Clothing Manipulation and hygiene: with modified independence;sitting/lateral leans;sit to/from stand Additional ADL Goal #1: Pt to recall and verbalize 3 fall prevention strategies with 0 verbal cues. Additional ADL Goal #2: Pt to recall and verbalize 3 energy conservation techniques with 0 verbal cues.  OT Frequency: Min 3X/week   Barriers to D/C:            Co-evaluation  AM-PAC OT "6 Clicks" Daily Activity     Outcome Measure Help from another person eating meals?: A Little Help from another person taking care of personal grooming?: A Little Help from another person toileting, which includes using toliet, bedpan, or urinal?: A Little Help from another person bathing (including washing, rinsing, drying)?: A Little Help from another person to put on and taking off regular upper body clothing?: A  Little Help from another person to put on and taking off regular lower body clothing?: A Little 6 Click Score: 18   End of Session Nurse Communication: Mobility status  Activity Tolerance: Patient tolerated treatment well Patient left: in chair;with call bell/phone within reach;with chair alarm set  OT Visit Diagnosis: Unsteadiness on feet (R26.81);Repeated falls (R29.6);Muscle weakness (generalized) (M62.81)                Time: [id number] OT Time Calculation (min): 41 min Charges:  OT General Charges $OT Visit: 1 Visit OT Evaluation $OT Eval Low Complexity: 1 Low OT Treatments $Self Care/Home Management : 23-37 mins  Mauri Brooklyn OTR/L [phone]   Mauri Brooklyn 05/17/2019, 12:57 PM

## 2019-05-18 LAB — COMPREHENSIVE METABOLIC PANEL
ALT: 14 U/L (ref 0–44)
AST: 17 U/L (ref 15–41)
Albumin: 3 g/dL — ABNORMAL LOW (ref 3.5–5.0)
Alkaline Phosphatase: 50 U/L (ref 38–126)
Anion gap: 9 (ref 5–15)
BUN: 23 mg/dL (ref 8–23)
CO2: 30 mmol/L (ref 22–32)
Calcium: 8.3 mg/dL — ABNORMAL LOW (ref 8.9–10.3)
Chloride: 100 mmol/L (ref 98–111)
Creatinine, Ser: 0.81 mg/dL (ref 0.44–1.00)
GFR calc Af Amer: >60 mL/min (ref >60)
GFR calc non Af Amer: >60 mL/min (ref >60)
Glucose, Bld: 147 mg/dL — ABNORMAL HIGH (ref 70–99)
Potassium: 4.1 mmol/L (ref 3.5–5.1)
Sodium: 139 mmol/L (ref 135–145)
Total Bilirubin: 0.3 mg/dL (ref 0.3–1.2)
Total Protein: 5.7 g/dL — ABNORMAL LOW (ref 6.5–8.1)

## 2019-05-18 LAB — D-DIMER, QUANTITATIVE: D-Dimer, Quant: 2.77 ug/mL-FEU — ABNORMAL HIGH (ref 0.00–0.50)

## 2019-05-18 LAB — GLUCOSE, CAPILLARY
Glucose-Capillary: 119 mg/dL — ABNORMAL HIGH (ref 70–99)
Glucose-Capillary: 125 mg/dL — ABNORMAL HIGH (ref 70–99)
Glucose-Capillary: 149 mg/dL — ABNORMAL HIGH (ref 70–99)

## 2019-05-18 LAB — MAGNESIUM: Magnesium: 1.7 mg/dL (ref 1.7–2.4)

## 2019-05-18 LAB — C-REACTIVE PROTEIN: CRP: 1 mg/dL — ABNORMAL HIGH (ref <1.0)

## 2019-05-18 MED ORDER — DEXAMETHASONE 6 MG PO TABS: 6.0000 mg | ORAL_TABLET | Freq: Every day | ORAL | 0 refills | Status: AC

## 2019-05-18 NOTE — Discharge Summary (Signed)
Physician Discharge Summary  [name] [id number] DOB: 15-Oct-1935 DOA: 05/15/2019  PCP: [name], MD  Admit date: 05/15/2019 Discharge date: 05/18/2019  Admitted From: Home Disposition: Sister's home   Recommendations for Outpatient Follow-up:  1. Follow up with PCP in 1-2 weeks 2. Please obtain CMP/CBC at follow up  Home Health: Declines services Equipment/Devices: Continue baseline supplemental oxygen Discharge Condition: Stable CODE STATUS: Full Diet recommendation: Heart healthy  Brief/Interim Summary: [name] is an 84 y.o. female with 2L O2-dependent COPD, OSA not on CPAP, CAD, and RA on hydroxychloroquine and methotrexate who presented to the ED 1/22 having recently tested positive for covid-19 and having progressive fatigue/weakness with worsening exertional dyspnea requiring more home oxygen than baseline. Work up was largely reassuring with no infiltrates on CXR, though she was requiring 4L O2 at rest to maintain SpO2 >90%. She was admitted, started on remdesivir and steroids with subsequent symptomatic improvement. Hypoxia has since returned to baseline and she is functionally stable for discharge with assistance at home. She will continue steroids for 10 total days and complete the 5th of 5 doses of remdesivir in the outpatient infusion clinic 1/26.  Discharge Diagnoses:  Principal Problem:   Acute on chronic respiratory failure with hypoxia (HCC) Active Problems:   HLD (hyperlipidemia)   Stage 3 severe COPD by GOLD classification (HCC)   Rheumatoid arthritis ([place])   COVID-19 virus infection  Acute on chronic hypoxemic respiratory failure and severe fatigue, muscular deconditioning due to covid-19 infection on COPD without exacerbation: SARS-CoV-2 antigen is positive on admission. CXR reassuring, though inflammatory markers elevated and with hypoxemia may turn out infiltrates with time. No evidence of consolidation or PE on CTA chest. PCT negative. ECG  nonischemic, troponin normal. - Continue supplemental oxygen (2L) at baseline - Continue home bronchodilator therapies - Continue remdesivir x5 days 1/22 - 1/26. Appointment made prior to discharge for 5th dose the day after discharge. - Steroids x10 days - Continue airborne, contact isolation for 21 days from positive testing. - LFTs wnl, CRP 1.0, lymphopenia noted on CBC Diff. Monitor at follow up.   Hypomagnesemia:  - Supplemented and wnl on day of discharge.  Rheumatoid arthritis: Quiescent. - Continue home hydroxychloroquine (QTc 470msec), with sustained improvement from covid perspective and high risk of relapse of RA symptoms, would continue methotrexate as scheduled.  Prediabetes: HbA1c 5.9% - CBGs remained at goal without significant hyperglycemia.  Dyslipidemia:  - Continue statin  GERD:  - Continue PPI  Depression:  - Continue SSRI  Obesity: BMI 37. Noted.  Discharge Instructions Discharge Instructions    Diet - low sodium heart healthy   Complete by: As directed    Discharge instructions   Complete by: As directed    You are being discharged from the hospital after treatment for covid-19 infection. You are felt to be stable enough to no longer require inpatient monitoring, testing, and treatment, though you will need to follow the recommendations below: - Continue taking decadron for 7 more doses (sent to your pharmacy) - Return tomorrow to this hospital for the final dose of remdesivir. - Per CDC guidelines, you will need to remain in isolation for 21 days from your first positive covid test. - Do not take NSAID medications (including, but not limited to, ibuprofen, advil, motrin, naproxen, aleve, goody's powder, etc.) - Follow up with your doctor in the next week via telehealth or seek medical attention right away if your symptoms get WORSE.  - Consider donating plasma after you have  recovered (either 14 days after a negative test or 28 days after symptoms  have completely resolved) because your antibodies to this virus may be helpful to give to others with life-threatening infections. Please go to the website www.[email or web address] if you would like to consider volunteering for plasma donation.    Directions for you at home:  Wear a facemask You should wear a facemask that covers your nose and mouth when you are in the same room with other people and when you visit a healthcare provider. People who live with or visit you should also wear a facemask while they are in the same room with you.  Separate yourself from other people in your home As much as possible, you should stay in a different room from other people in your home. Also, you should use a separate bathroom, if available.  Avoid sharing household items You should not share dishes, drinking glasses, cups, eating utensils, towels, bedding, or other items with other people in your home. After using these items, you should wash them thoroughly with soap and water.  Cover your coughs and sneezes Cover your mouth and nose with a tissue when you cough or sneeze, or you can cough or sneeze into your sleeve. Throw used tissues in a lined trash can, and immediately wash your hands with soap and water for at least 20 seconds or use an alcohol-based hand rub.  Wash your [facility] your hands often and thoroughly with soap and water for at least 20 seconds. You can use an alcohol-based hand sanitizer if soap and water are not available and if your hands are not visibly dirty. Avoid touching your eyes, nose, and mouth with unwashed hands.  Directions for those who live with, or provide care at home for you:  Limit the number of people who have contact with the patient If possible, have only one caregiver for the patient. Other household members should stay in another home or place of residence. If this is not possible, they should stay in another room, or be separated from the patient as much as  possible. Use a separate bathroom, if available. Restrict visitors who do not have an essential need to be in the home.  Ensure good ventilation Make sure that shared spaces in the home have good air flow, such as from an air conditioner or an opened window, weather permitting.  Wash your hands often Wash your hands often and thoroughly with soap and water for at least 20 seconds. You can use an alcohol based hand sanitizer if soap and water are not available and if your hands are not visibly dirty. Avoid touching your eyes, nose, and mouth with unwashed hands. Use disposable paper towels to dry your hands. If not available, use dedicated cloth towels and replace them when they become wet.  Wear a facemask and gloves Wear a disposable facemask at all times in the room and gloves when you touch or have contact with the patient's blood, body fluids, and/or secretions or excretions, such as sweat, saliva, sputum, nasal mucus, vomit, urine, or feces.  Ensure the mask fits over your nose and mouth tightly, and do not touch it during use. Throw out disposable facemasks and gloves after using them. Do not reuse. Wash your hands immediately after removing your facemask and gloves. If your personal clothing becomes contaminated, carefully remove clothing and launder. Wash your hands after handling contaminated clothing. Place all used disposable facemasks, gloves, and other waste in a lined container before  disposing them with other household waste. Remove gloves and wash your hands immediately after handling these items.  Do not share dishes, glasses, or other household items with the patient Avoid sharing household items. You should not share dishes, drinking glasses, cups, eating utensils, towels, bedding, or other items with a patient who is confirmed to have, or being evaluated for, COVID-19 infection. After the person uses these items, you should wash them thoroughly with soap and water.  Wash  laundry thoroughly Immediately remove and wash clothes or bedding that have blood, body fluids, and/or secretions or excretions, such as sweat, saliva, sputum, nasal mucus, vomit, urine, or feces, on them. Wear gloves when handling laundry from the patient. Read and follow directions on labels of laundry or clothing items and detergent. In general, wash and dry with the warmest temperatures recommended on the label.  Clean all areas the individual has used often Clean all touchable surfaces, such as counters, tabletops, doorknobs, bathroom fixtures, toilets, phones, keyboards, tablets, and bedside tables, every day. Also, clean any surfaces that may have blood, body fluids, and/or secretions or excretions on them. Wear gloves when cleaning surfaces the patient has come in contact with. Use a diluted bleach solution (e.g., dilute bleach with 1 part bleach and 10 parts water) or a household disinfectant with a label that says EPA-registered for coronaviruses. To make a bleach solution at home, add 1 tablespoon of bleach to 1 quart (4 cups) of water. For a larger supply, add  cup of bleach to 1 gallon (16 cups) of water. Read labels of cleaning products and follow recommendations provided on product labels. Labels contain instructions for safe and effective use of the cleaning product including precautions you should take when applying the product, such as wearing gloves or eye protection and making sure you have good ventilation during use of the product. Remove gloves and wash hands immediately after cleaning.  Monitor yourself for signs and symptoms of illness Caregivers and household members are considered close contacts, should monitor their health, and will be asked to limit movement outside of the home to the extent possible. Follow the monitoring steps for close contacts listed on the symptom monitoring form.  If you have additional questions, contact your local health department or call the  epidemiologist on call at [phone] (available 24/7). This guidance is subject to change. For the most up-to-date guidance from [facility], please refer to their website: YouBlogs.pl   Increase activity slowly   Complete by: As directed    MyChart COVID-19 home monitoring program   Complete by: May 18, 2019    Is the patient willing to use the [facility] for home monitoring?: Yes     Allergies as of 05/18/2019      Reactions   Kapidex [dexlansoprazole] Other (See Comments)   Stomach pain, diarrhea   Zegerid [omeprazole] Other (See Comments)   Burning in chest      Medication List    STOP taking these medications   cefdinir 300 MG capsule Commonly known as: OMNICEF   doxycycline 100 MG tablet Commonly known as: VIBRA-TABS   predniSONE 10 MG tablet Commonly known as: DELTASONE     TAKE these medications   acetaminophen 325 MG tablet Commonly known as: TYLENOL Take 2 tablets (650 mg total) by mouth every 6 (six) hours as needed for mild pain (or Fever >/= 101).   albuterol 108 (90 Base) MCG/ACT inhaler Commonly known as: VENTOLIN HFA Inhale 2 puffs into the lungs every 6 (  six) hours as needed. For shortness of breath.   albuterol (2.5 MG/3ML) 0.083% nebulizer solution Commonly known as: PROVENTIL Take 3 mLs (2.5 mg total) by nebulization every 4 (four) hours as needed for wheezing or shortness of breath. For shortness of breath.   aspirin EC 81 MG tablet Take 81 mg by mouth daily.   atorvastatin 40 MG tablet Commonly known as: LIPITOR Take 40 mg by mouth every morning.   azelastine 0.1 % nasal spray Commonly known as: ASTELIN Place 1 spray into the nose 2 (two) times daily.   budesonide-formoterol 160-4.5 MCG/ACT inhaler Commonly known as: SYMBICORT Inhale 2 puffs into the lungs 2 (two) times daily.   CALCIUM + D PO Take 2 tablets by mouth daily.   calcium-vitamin D 500-200 MG-UNIT  tablet Commonly known as: OSCAL WITH D Take 2 tablets by mouth daily with breakfast.   carboxymethylcellulose 0.5 % Soln Commonly known as: REFRESH PLUS 2 drops 2 (two) times daily as needed (dry eye).   cetirizine 10 MG tablet Commonly known as: ZYRTEC Take 1 tablet (10 mg total) by mouth every morning.   cyanocobalamin 1000 MCG tablet Take 1 tablet (1,000 mcg total) by mouth daily.   dexamethasone 6 MG tablet Commonly known as: Decadron Take 1 tablet (6 mg total) by mouth daily for 7 days.   esomeprazole 40 MG capsule Commonly known as: NEXIUM Take 1 capsule (40 mg total) by mouth every morning.   folic acid 1 MG tablet Commonly known as: FOLVITE Take 1 tablet (1 mg total) by mouth daily.   gabapentin 100 MG capsule Commonly known as: NEURONTIN Take 2-3 capsules (200-300 mg total) by mouth 3 (three) times daily. 200 mg every AM and at noon and 300 mg QHS   hydroxychloroquine 200 MG tablet Commonly known as: PLAQUENIL Take 200 mg by mouth daily.   ipratropium-albuterol 0.5-2.5 (3) MG/3ML Soln Commonly known as: DUONEB Take 3 mLs by nebulization 2 (two) times daily.   meloxicam 15 MG tablet Commonly known as: MOBIC Take 15 mg by mouth daily.   methotrexate 2.5 MG tablet Commonly known as: RHEUMATREX Take 15 mg by mouth once a week.   mupirocin ointment 2 % Commonly known as: BACTROBAN Place 1 application into the nose 2 (two) times daily.   polyethylene glycol 17 g packet Commonly known as: MIRALAX / GLYCOLAX Take 17 g by mouth daily.   polyvinyl alcohol 1.4 % ophthalmic solution Commonly known as: LIQUIFILM TEARS Place 2 drops into both eyes as needed for dry eyes.   sertraline 100 MG tablet Commonly known as: ZOLOFT Take 100 mg by mouth daily.   tiotropium 18 MCG inhalation capsule Commonly known as: SPIRIVA Place 1 capsule (18 mcg total) into inhaler and inhale every morning.   vitamin A 10000 UNIT capsule Take 10,000 Units by mouth daily.    Vitamin D 50 MCG (2000 UT) tablet Take 4,000 Units by mouth daily.      Follow-up Information    [name], MD. Schedule an appointment as soon as possible for a visit.   Specialty: Family Medicine Contact information: [place] [address] [phone]        [place]. Go to.   Why: You are scheduled for an outpatient infusion of Remdesivir at 1130AM on Tuesday 1/26  Please report to [facility] at [address]  Drive to the security guard and tell them you are here for an infusion. They will direct  you to the front. Contact information: [place] [phone]         Allergies  Allergen Reactions  . Kapidex [Dexlansoprazole] Other (See Comments)    Stomach pain, diarrhea  . Zegerid [Omeprazole] Other (See Comments)    Burning in chest    Consultations:  None  Procedures/Studies: CT Angio Chest PE W and/or Wo Contrast  Result Date: 05/16/2019 CLINICAL DATA:  Shortness of breath EXAM: CT ANGIOGRAPHY CHEST WITH CONTRAST TECHNIQUE: Multidetector CT imaging of the chest was performed using the standard protocol during bolus administration of intravenous contrast. Multiplanar CT image reconstructions and MIPs were obtained to evaluate the vascular anatomy. CONTRAST:  145mL OMNIPAQUE IOHEXOL 350 MG/ML SOLN COMPARISON:  CT dated May 21, 2016 FINDINGS: Cardiovascular: Contrast injection is sufficient to demonstrate satisfactory opacification of the pulmonary arteries to the segmental level. There is no pulmonary embolus. The main pulmonary artery is within normal limits for size. There is no CT evidence of acute right heart strain. Aortic calcifications are noted of the thoracic aorta without evidence for thoracic aortic aneurysm. Heart size is normal, without pericardial effusion. Mediastinum/Nodes: --No mediastinal or hilar lymphadenopathy. --No axillary  lymphadenopathy. --No supraclavicular lymphadenopathy. --Normal thyroid gland. --The esophagus is unremarkable Lungs/Pleura: No pulmonary nodules or masses. No pleural effusion or pneumothorax. No focal airspace consolidation. No focal pleural abnormality. Upper Abdomen: No acute abnormality. Musculoskeletal: No chest wall abnormality. No acute or significant osseous findings. Review of the MIP images confirms the above findings. IMPRESSION: 1. No evidence of pulmonary embolus. 2. No acute abnormality of the chest. Aortic Atherosclerosis (ICD10-I70.0). Electronically Signed   By: Constance Holster M.D.   On: 05/16/2019 02:41   DG Chest Portable 1 View  Result Date: 05/15/2019 CLINICAL DATA:  COVID positive, short of breath EXAM: PORTABLE CHEST 1 VIEW COMPARISON:  06/08/2018 FINDINGS: The heart size and mediastinal contours are within normal limits. Aortic atherosclerosis. Both lungs are clear. The visualized skeletal structures are unremarkable. IMPRESSION: No active disease. Electronically Signed   By: Donavan Foil M.D.   On: 05/15/2019 22:23    Subjective: Coughing still, but wants to go home with sister. No chest pain or shortness of breath at rest. Feels near baseline. Eating ok. Able to get to bathroom unassisted. Declines home health. Confirms she will be able to get back for infusion tomorrow.  Discharge Exam: Vitals:   05/18/19 0500 05/18/19 0800  BP: 130/68 (!) 124/54  Pulse: 70 60  Resp: 18 18  Temp: 98.6 F (37 C) 98.2 F (36.8 C)  SpO2: 96% 96%   General: Pt is alert, awake, not in acute distress Cardiovascular: RRR, S1/S2 +, no rubs, no gallops Respiratory: CTA bilaterally, no wheezing, no rhonchi Abdominal: Soft, NT, ND, bowel sounds + Extremities: No edema, no cyanosis  Labs: BNP (last 3 results) Recent Labs    06/08/18 2022  BNP [id number]   Basic Metabolic Panel: Recent Labs  Lab 05/15/19 2111 05/15/19 2246 05/17/19 0340 05/18/19 0558  NA 134* 134* 138 139  K  3.5 3.3* 4.0 4.1  CL 94*  --  99 100  CO2 28  --  28 30  GLUCOSE 110*  --  144* 147*  BUN 13  --  21 23  CREATININE 0.94  --  0.69 0.81  CALCIUM 8.7*  --  8.5* 8.3*  MG  --   --  1.5* 1.7  PHOS  --   --  3.1  --    Liver  Function Tests: Recent Labs  Lab 05/15/19 2111 05/17/19 0340 05/18/19 0558  AST 21 18 17   ALT 14 12 14   ALKPHOS 61 50 50  BILITOT 0.4 0.6 0.3  PROT 6.6 6.1* 5.7*  ALBUMIN 3.4* 3.1* 3.0*   No results for input(s): LIPASE, AMYLASE in the last 168 hours. No results for input(s): AMMONIA in the last 168 hours. CBC: Recent Labs  Lab 05/15/19 2111 05/15/19 2246 05/17/19 0340  WBC 6.4  --  3.9*  NEUTROABS 4.5  --  3.1  HGB 13.8 13.3 12.5  HCT 43.3 39.0 38.5  MCV 92.3  --  91.4  PLT 283  --  292   Cardiac Enzymes: No results for input(s): CKTOTAL, CKMB, CKMBINDEX, TROPONINI in the last 168 hours. BNP: Invalid input(s): POCBNP CBG: Recent Labs  Lab 05/17/19 1207 05/17/19 1536 05/17/19 2227 05/18/19 0815 05/18/19 1114  GLUCAP 128* 90 82 125* 149*   D-Dimer Recent Labs    05/17/19 0340 05/18/19 0558  DDIMER 1.58* 2.77*   Hgb A1c Recent Labs    05/17/19 0340  HGBA1C 5.9*   Lipid Profile Recent Labs    05/15/19 2211  TRIG 151*   Thyroid function studies No results for input(s): TSH, T4TOTAL, T3FREE, THYROIDAB in the last 72 hours.  Invalid input(s): FREET3 Anemia work up Recent Labs    05/15/19 2306 05/17/19 0340  FERRITIN 76 77   Urinalysis    Component Value Date/Time   COLORURINE YELLOW 06/08/2018 1521   APPEARANCEUR CLEAR 06/08/2018 1521   LABSPEC 1.025 06/08/2018 1521   PHURINE 5.0 06/08/2018 1521   GLUCOSEU NEGATIVE 06/08/2018 1521   HGBUR NEGATIVE 06/08/2018 1521   BILIRUBINUR NEGATIVE 06/08/2018 1521   BILIRUBINUR neg 08/17/2013 1446   KETONESUR 20 (A) 06/08/2018 1521   PROTEINUR 30 (A) 06/08/2018 1521   UROBILINOGEN 0.2 08/17/2013 1446   UROBILINOGEN 0.2 07/06/2011 1519   NITRITE NEGATIVE 06/08/2018 1521    LEUKOCYTESUR SMALL (A) 06/08/2018 1521    Microbiology Recent Results (from the past 240 hour(s))  Blood Culture (routine x 2)     Status: None (Preliminary result)   Collection Time: 05/15/19 11:06 PM   Specimen: BLOOD RIGHT ARM  Result Value Ref Range Status   Specimen Description BLOOD RIGHT ARM  Final   Special Requests   Final    BOTTLES DRAWN AEROBIC AND ANAEROBIC Blood Culture results may not be optimal due to an inadequate volume of blood received in culture bottles   Culture   Final    NO GROWTH 2 DAYS Performed at [facility], [place] [address], [place], [place] [address]    Report Status PENDING  Incomplete  Blood Culture (routine x 2)     Status: None (Preliminary result)   Collection Time: 05/15/19 11:06 PM   Specimen: BLOOD RIGHT WRIST  Result Value Ref Range Status   Specimen Description BLOOD RIGHT WRIST  Final   Special Requests   Final    BOTTLES DRAWN AEROBIC AND ANAEROBIC Blood Culture adequate volume   Culture   Final    NO GROWTH 2 DAYS Performed at [facility], [place] [address], [place], [place] [address]    Report Status PENDING  Incomplete    Time coordinating discharge: Approximately 40 minutes  Patrecia Pour, MD  [facility] 05/18/2019, 11:53 AM

## 2019-05-18 NOTE — Progress Notes (Signed)
You are scheduled for an outpatient infusion of Remdesivir at 1130AM on Tuesday 1/26  Please report to [facility] at [address].  Drive to the security guard and tell them you are here for an infusion. They will direct you to the front entrance where we will come and get you.  For questions call [phone].  Thanks

## 2019-05-18 NOTE — TOC Transition Note (Signed)
Transition of Care [facility]) - CM/SW Discharge Note   Patient Details  Name: [name] MRN: [id number] Date of Birth: September 27, 1935  [facility]) CM/SW Contact:  Octa Uplinger, Chauncey Reading, RN Phone Number: 05/18/2019, 9:56 AM   Clinical Narrative:   Patient discharging home today. Will be staying with her sister in [place]. Patient declines home health. Sister aware and agreeable. Sister will transport to remdesivir infusion on 1/26 at 1130.     Final next level of care: Home w Home Health Services Barriers to Discharge: Barriers Resolved     Readmission Risk Interventions No flowsheet data found.

## 2019-05-18 NOTE — Discharge Instructions (Signed)
You are scheduled for an outpatient infusion of Remdesivir at 1130AM on Tuesday 1/26.  Please report to [facility] at [address]  Drive to the security guard and tell them you are here for an infusion. They will direct you to the front entrance where we will come and get you.  For questions call [phone].  Thanks

## 2019-05-19 ENCOUNTER — Ambulatory Visit (HOSPITAL_COMMUNITY)
Admission: RE | Admit: 2019-05-19 | Discharge: 2019-05-19 | Disposition: A | Discharge status: Home / Self Care | Payer: Medicare Other | Source: Ambulatory Visit | Attending: Pulmonary Disease | Admitting: Pulmonary Disease

## 2019-05-19 VITALS — BP 145/80 | HR 71 | Temp 98.3°F | Resp 18

## 2019-05-19 DIAGNOSIS — U071 COVID-19: Secondary | ICD-10-CM | POA: Diagnosis not present

## 2019-05-19 MED ORDER — FAMOTIDINE IN NACL 20-0.9 MG/50ML-% IV SOLN: 20.0000 mg | Freq: Once | INTRAVENOUS | Status: DC | PRN

## 2019-05-19 MED ORDER — SODIUM CHLORIDE 0.9 % IV SOLN
100.0000 mg | Freq: Once | INTRAVENOUS | Status: AC
  Administered 2019-05-19: 12:00:00 100 mg via INTRAVENOUS

## 2019-05-19 MED ORDER — DIPHENHYDRAMINE HCL 50 MG/ML IJ SOLN: 50.0000 mg | Freq: Once | INTRAMUSCULAR | Status: DC | PRN

## 2019-05-19 MED ORDER — ALBUTEROL SULFATE HFA 108 (90 BASE) MCG/ACT IN AERS: 2.0000 | INHALATION_SPRAY | Freq: Once | RESPIRATORY_TRACT | Status: DC | PRN

## 2019-05-19 MED ORDER — SODIUM CHLORIDE 0.9 % IV SOLN
INTRAVENOUS | Status: AC
  Filled 2019-05-19: qty 20

## 2019-05-19 MED ORDER — EPINEPHRINE 0.3 MG/0.3ML IJ SOAJ: 0.3000 mg | Freq: Once | INTRAMUSCULAR | Status: DC | PRN

## 2019-05-19 MED ORDER — SODIUM CHLORIDE 0.9 % IV SOLN: INTRAVENOUS | Status: DC | PRN

## 2019-05-19 MED ORDER — METHYLPREDNISOLONE SODIUM SUCC 125 MG IJ SOLR: 125.0000 mg | Freq: Once | INTRAMUSCULAR | Status: DC | PRN

## 2019-05-19 NOTE — Progress Notes (Signed)
  Diagnosis: COVID-19  Physician:  Procedure: Covid Infusion Clinic Med: remdesivir infusion.  Complications: No immediate complications noted.  Discharge: Discharged home   Virgilio Belling 05/19/2019

## 2019-05-21 LAB — CULTURE, BLOOD (ROUTINE X 2)
Culture: NO GROWTH
Culture: NO GROWTH
Special Requests: ADEQUATE

## 2019-05-26 DIAGNOSIS — U071 COVID-19: Secondary | ICD-10-CM | POA: Diagnosis not present

## 2019-05-26 DIAGNOSIS — J449 Chronic obstructive pulmonary disease, unspecified: Secondary | ICD-10-CM | POA: Diagnosis not present

## 2019-05-26 DIAGNOSIS — J9621 Acute and chronic respiratory failure with hypoxia: Secondary | ICD-10-CM | POA: Diagnosis not present

## 2019-05-28 DIAGNOSIS — M62838 Other muscle spasm: Secondary | ICD-10-CM | POA: Diagnosis not present

## 2019-05-28 DIAGNOSIS — J441 Chronic obstructive pulmonary disease with (acute) exacerbation: Secondary | ICD-10-CM | POA: Diagnosis not present

## 2019-05-28 DIAGNOSIS — U071 COVID-19: Secondary | ICD-10-CM | POA: Diagnosis not present

## 2019-06-01 ENCOUNTER — Ambulatory Visit: Payer: Medicare Other | Admitting: Cardiology

## 2019-06-09 ENCOUNTER — Encounter: Payer: Self-pay | Admitting: General Practice

## 2019-06-22 ENCOUNTER — Other Ambulatory Visit: Payer: Self-pay

## 2019-07-06 DIAGNOSIS — M25561 Pain in right knee: Secondary | ICD-10-CM | POA: Diagnosis not present

## 2019-07-06 DIAGNOSIS — M154 Erosive (osteo)arthritis: Secondary | ICD-10-CM | POA: Diagnosis not present

## 2019-07-06 DIAGNOSIS — M5412 Radiculopathy, cervical region: Secondary | ICD-10-CM | POA: Diagnosis not present

## 2019-07-06 DIAGNOSIS — J449 Chronic obstructive pulmonary disease, unspecified: Secondary | ICD-10-CM | POA: Diagnosis not present

## 2019-07-06 DIAGNOSIS — Z79899 Other long term (current) drug therapy: Secondary | ICD-10-CM | POA: Diagnosis not present

## 2019-07-06 DIAGNOSIS — M797 Fibromyalgia: Secondary | ICD-10-CM | POA: Diagnosis not present

## 2019-07-06 DIAGNOSIS — M545 Low back pain: Secondary | ICD-10-CM | POA: Diagnosis not present

## 2019-07-06 DIAGNOSIS — M0579 Rheumatoid arthritis with rheumatoid factor of multiple sites without organ or systems involvement: Secondary | ICD-10-CM | POA: Diagnosis not present

## 2019-07-06 DIAGNOSIS — M25461 Effusion, right knee: Secondary | ICD-10-CM | POA: Diagnosis not present

## 2019-07-06 DIAGNOSIS — M150 Primary generalized (osteo)arthritis: Secondary | ICD-10-CM | POA: Diagnosis not present

## 2019-07-16 ENCOUNTER — Emergency Department (HOSPITAL_COMMUNITY): Payer: Medicare Other

## 2019-07-16 ENCOUNTER — Encounter (HOSPITAL_COMMUNITY): Payer: Self-pay

## 2019-07-16 ENCOUNTER — Inpatient Hospital Stay (HOSPITAL_COMMUNITY)
Admission: EM | Admit: 2019-07-16 | Discharge: 2019-07-18 | DRG: 392 | Disposition: A | Discharge status: Home / Self Care | Payer: Medicare Other | Attending: Internal Medicine | Admitting: Internal Medicine

## 2019-07-16 DIAGNOSIS — N179 Acute kidney failure, unspecified: Secondary | ICD-10-CM

## 2019-07-16 DIAGNOSIS — Z7951 Long term (current) use of inhaled steroids: Secondary | ICD-10-CM

## 2019-07-16 DIAGNOSIS — J189 Pneumonia, unspecified organism: Secondary | ICD-10-CM | POA: Diagnosis not present

## 2019-07-16 DIAGNOSIS — Z9049 Acquired absence of other specified parts of digestive tract: Secondary | ICD-10-CM | POA: Diagnosis not present

## 2019-07-16 DIAGNOSIS — A09 Infectious gastroenteritis and colitis, unspecified: Secondary | ICD-10-CM | POA: Diagnosis not present

## 2019-07-16 DIAGNOSIS — R296 Repeated falls: Secondary | ICD-10-CM

## 2019-07-16 DIAGNOSIS — Z9981 Dependence on supplemental oxygen: Secondary | ICD-10-CM

## 2019-07-16 DIAGNOSIS — R112 Nausea with vomiting, unspecified: Secondary | ICD-10-CM | POA: Diagnosis not present

## 2019-07-16 DIAGNOSIS — Z7982 Long term (current) use of aspirin: Secondary | ICD-10-CM

## 2019-07-16 DIAGNOSIS — Z20822 Contact with and (suspected) exposure to covid-19: Secondary | ICD-10-CM | POA: Diagnosis not present

## 2019-07-16 DIAGNOSIS — Z825 Family history of asthma and other chronic lower respiratory diseases: Secondary | ICD-10-CM

## 2019-07-16 DIAGNOSIS — K219 Gastro-esophageal reflux disease without esophagitis: Secondary | ICD-10-CM | POA: Diagnosis present

## 2019-07-16 DIAGNOSIS — R197 Diarrhea, unspecified: Secondary | ICD-10-CM | POA: Diagnosis present

## 2019-07-16 DIAGNOSIS — Z961 Presence of intraocular lens: Secondary | ICD-10-CM | POA: Diagnosis present

## 2019-07-16 DIAGNOSIS — K5730 Diverticulosis of large intestine without perforation or abscess without bleeding: Principal | ICD-10-CM | POA: Diagnosis present

## 2019-07-16 DIAGNOSIS — F329 Major depressive disorder, single episode, unspecified: Secondary | ICD-10-CM | POA: Diagnosis present

## 2019-07-16 DIAGNOSIS — J449 Chronic obstructive pulmonary disease, unspecified: Secondary | ICD-10-CM | POA: Diagnosis present

## 2019-07-16 DIAGNOSIS — J9610 Chronic respiratory failure, unspecified whether with hypoxia or hypercapnia: Secondary | ICD-10-CM | POA: Diagnosis present

## 2019-07-16 DIAGNOSIS — M8580 Other specified disorders of bone density and structure, unspecified site: Secondary | ICD-10-CM | POA: Diagnosis present

## 2019-07-16 DIAGNOSIS — M25551 Pain in right hip: Secondary | ICD-10-CM | POA: Diagnosis not present

## 2019-07-16 DIAGNOSIS — J9611 Chronic respiratory failure with hypoxia: Secondary | ICD-10-CM | POA: Diagnosis present

## 2019-07-16 DIAGNOSIS — H9191 Unspecified hearing loss, right ear: Secondary | ICD-10-CM | POA: Diagnosis present

## 2019-07-16 DIAGNOSIS — Z9842 Cataract extraction status, left eye: Secondary | ICD-10-CM | POA: Diagnosis not present

## 2019-07-16 DIAGNOSIS — Z803 Family history of malignant neoplasm of breast: Secondary | ICD-10-CM

## 2019-07-16 DIAGNOSIS — E785 Hyperlipidemia, unspecified: Secondary | ICD-10-CM | POA: Diagnosis present

## 2019-07-16 DIAGNOSIS — Z79899 Other long term (current) drug therapy: Secondary | ICD-10-CM | POA: Diagnosis not present

## 2019-07-16 DIAGNOSIS — E860 Dehydration: Secondary | ICD-10-CM | POA: Diagnosis not present

## 2019-07-16 DIAGNOSIS — R918 Other nonspecific abnormal finding of lung field: Secondary | ICD-10-CM | POA: Diagnosis not present

## 2019-07-16 DIAGNOSIS — D72829 Elevated white blood cell count, unspecified: Secondary | ICD-10-CM | POA: Diagnosis present

## 2019-07-16 DIAGNOSIS — M797 Fibromyalgia: Secondary | ICD-10-CM | POA: Diagnosis present

## 2019-07-16 DIAGNOSIS — S79911A Unspecified injury of right hip, initial encounter: Secondary | ICD-10-CM | POA: Diagnosis not present

## 2019-07-16 DIAGNOSIS — Z9841 Cataract extraction status, right eye: Secondary | ICD-10-CM

## 2019-07-16 DIAGNOSIS — Z8616 Personal history of COVID-19: Secondary | ICD-10-CM | POA: Diagnosis not present

## 2019-07-16 DIAGNOSIS — M069 Rheumatoid arthritis, unspecified: Secondary | ICD-10-CM | POA: Diagnosis present

## 2019-07-16 DIAGNOSIS — R1111 Vomiting without nausea: Secondary | ICD-10-CM | POA: Diagnosis not present

## 2019-07-16 DIAGNOSIS — R0902 Hypoxemia: Secondary | ICD-10-CM | POA: Diagnosis not present

## 2019-07-16 DIAGNOSIS — J441 Chronic obstructive pulmonary disease with (acute) exacerbation: Secondary | ICD-10-CM | POA: Diagnosis not present

## 2019-07-16 LAB — COMPREHENSIVE METABOLIC PANEL
ALT: 13 U/L (ref 0–44)
AST: 14 U/L — ABNORMAL LOW (ref 15–41)
Albumin: 3.5 g/dL (ref 3.5–5.0)
Alkaline Phosphatase: 59 U/L (ref 38–126)
Anion gap: 11 (ref 5–15)
BUN: 32 mg/dL — ABNORMAL HIGH (ref 8–23)
CO2: 28 mmol/L (ref 22–32)
Calcium: 8.6 mg/dL — ABNORMAL LOW (ref 8.9–10.3)
Chloride: 95 mmol/L — ABNORMAL LOW (ref 98–111)
Creatinine, Ser: 1.21 mg/dL — ABNORMAL HIGH (ref 0.44–1.00)
GFR calc Af Amer: 49 mL/min — ABNORMAL LOW (ref >60)
GFR calc non Af Amer: 42 mL/min — ABNORMAL LOW (ref >60)
Glucose, Bld: 107 mg/dL — ABNORMAL HIGH (ref 70–99)
Potassium: 3.5 mmol/L (ref 3.5–5.1)
Sodium: 134 mmol/L — ABNORMAL LOW (ref 135–145)
Total Bilirubin: 0.7 mg/dL (ref 0.3–1.2)
Total Protein: 7.4 g/dL (ref 6.5–8.1)

## 2019-07-16 LAB — CBC WITH DIFFERENTIAL/PLATELET
Abs Immature Granulocytes: 0.11 10*3/uL — ABNORMAL HIGH (ref 0.00–0.07)
Basophils Absolute: 0 10*3/uL (ref 0.0–0.1)
Basophils Relative: 0 %
Eosinophils Absolute: 0 10*3/uL (ref 0.0–0.5)
Eosinophils Relative: 0 %
HCT: 37.8 % (ref 36.0–46.0)
Hemoglobin: 11.9 g/dL — ABNORMAL LOW (ref 12.0–15.0)
Immature Granulocytes: 1 %
Lymphocytes Relative: 8 %
Lymphs Abs: 1.2 10*3/uL (ref 0.7–4.0)
MCH: 29.5 pg (ref 26.0–34.0)
MCHC: 31.5 g/dL (ref 30.0–36.0)
MCV: 93.6 fL (ref 80.0–100.0)
Monocytes Absolute: 1.2 10*3/uL — ABNORMAL HIGH (ref 0.1–1.0)
Monocytes Relative: 8 %
Neutro Abs: 12 10*3/uL — ABNORMAL HIGH (ref 1.7–7.7)
Neutrophils Relative %: 83 %
Platelets: 360 10*3/uL (ref 150–400)
RBC: 4.04 MIL/uL (ref 3.87–5.11)
RDW: 14.4 % (ref 11.5–15.5)
WBC: 14.6 10*3/uL — ABNORMAL HIGH (ref 4.0–10.5)
nRBC: 0 % (ref 0.0–0.2)

## 2019-07-16 LAB — URINALYSIS, ROUTINE W REFLEX MICROSCOPIC
Bilirubin Urine: NEGATIVE
Glucose, UA: NEGATIVE mg/dL
Hgb urine dipstick: NEGATIVE
Ketones, ur: NEGATIVE mg/dL
Nitrite: NEGATIVE
Protein, ur: NEGATIVE mg/dL
Specific Gravity, Urine: 1.013 (ref 1.005–1.030)
pH: 5 (ref 5.0–8.0)

## 2019-07-16 LAB — LIPASE, BLOOD: Lipase: 27 U/L (ref 11–51)

## 2019-07-16 MED ORDER — SODIUM CHLORIDE 0.9 % IV BOLUS
1000.0000 mL | Freq: Once | INTRAVENOUS | Status: AC
  Administered 2019-07-16: 1000 mL via INTRAVENOUS

## 2019-07-16 MED ORDER — FLUTICASONE-UMECLIDIN-VILANT 100-62.5-25 MCG/INH IN AEPB: 1.0000 | INHALATION_SPRAY | Freq: Every day | RESPIRATORY_TRACT | Status: DC

## 2019-07-16 MED ORDER — LORAZEPAM 2 MG/ML IJ SOLN: 1.0000 mg | Freq: Once | INTRAMUSCULAR | Status: DC

## 2019-07-16 MED ORDER — ASPIRIN EC 81 MG PO TBEC
81.0000 mg | DELAYED_RELEASE_TABLET | Freq: Every day | ORAL | Status: DC
  Administered 2019-07-17 – 2019-07-18 (×2): 81 mg via ORAL
  Filled 2019-07-16 (×2): qty 1

## 2019-07-16 MED ORDER — ENOXAPARIN SODIUM 40 MG/0.4ML ~~LOC~~ SOLN
40.0000 mg | SUBCUTANEOUS | Status: DC
  Administered 2019-07-17 – 2019-07-18 (×2): 40 mg via SUBCUTANEOUS
  Filled 2019-07-16 (×2): qty 0.4

## 2019-07-16 MED ORDER — SERTRALINE HCL 50 MG PO TABS
100.0000 mg | ORAL_TABLET | Freq: Every day | ORAL | Status: DC
  Administered 2019-07-17 – 2019-07-18 (×2): 100 mg via ORAL
  Filled 2019-07-16 (×2): qty 2

## 2019-07-16 MED ORDER — VITAMIN D 25 MCG (1000 UNIT) PO TABS
4000.0000 [IU] | ORAL_TABLET | Freq: Every day | ORAL | Status: DC
  Administered 2019-07-17 – 2019-07-18 (×2): 4000 [IU] via ORAL
  Filled 2019-07-16: qty 4

## 2019-07-16 MED ORDER — LEVOFLOXACIN 750 MG PO TABS
750.0000 mg | ORAL_TABLET | Freq: Once | ORAL | Status: AC
  Administered 2019-07-16: 19:00:00 750 mg via ORAL
  Filled 2019-07-16: qty 1

## 2019-07-16 MED ORDER — FOLIC ACID 1 MG PO TABS
1.0000 mg | ORAL_TABLET | Freq: Every day | ORAL | Status: DC
  Administered 2019-07-17 – 2019-07-18 (×2): 1 mg via ORAL
  Filled 2019-07-16 (×2): qty 1

## 2019-07-16 MED ORDER — GABAPENTIN 100 MG PO CAPS
200.0000 mg | ORAL_CAPSULE | Freq: Two times a day (BID) | ORAL | Status: DC
  Administered 2019-07-17 – 2019-07-18 (×3): 200 mg via ORAL
  Filled 2019-07-16 (×3): qty 2

## 2019-07-16 MED ORDER — VITAMIN B-12 1000 MCG PO TABS
1000.0000 ug | ORAL_TABLET | Freq: Every day | ORAL | Status: DC
  Administered 2019-07-17 – 2019-07-18 (×2): 1000 ug via ORAL
  Filled 2019-07-16 (×2): qty 1

## 2019-07-16 MED ORDER — LACTATED RINGERS IV SOLN: INTRAVENOUS | Status: DC

## 2019-07-16 MED ORDER — ATORVASTATIN CALCIUM 40 MG PO TABS
40.0000 mg | ORAL_TABLET | Freq: Every morning | ORAL | Status: DC
  Administered 2019-07-17 – 2019-07-18 (×2): 40 mg via ORAL
  Filled 2019-07-16 (×2): qty 1

## 2019-07-16 MED ORDER — CALCIUM CARBONATE-VITAMIN D 500-200 MG-UNIT PO TABS
2.0000 | ORAL_TABLET | Freq: Every day | ORAL | Status: DC
  Administered 2019-07-17 – 2019-07-18 (×2): 2 via ORAL
  Filled 2019-07-16 (×2): qty 2

## 2019-07-16 MED ORDER — PANTOPRAZOLE SODIUM 40 MG PO TBEC
40.0000 mg | DELAYED_RELEASE_TABLET | Freq: Every day | ORAL | Status: DC
  Administered 2019-07-17 – 2019-07-18 (×2): 40 mg via ORAL
  Filled 2019-07-16 (×2): qty 1

## 2019-07-16 MED ORDER — LORAZEPAM 2 MG/ML IJ SOLN
0.5000 mg | Freq: Once | INTRAMUSCULAR | Status: AC
  Administered 2019-07-16: 0.5 mg via INTRAVENOUS
  Filled 2019-07-16: qty 1

## 2019-07-16 MED ORDER — LORATADINE 10 MG PO TABS
10.0000 mg | ORAL_TABLET | Freq: Every day | ORAL | Status: DC
  Administered 2019-07-17 – 2019-07-18 (×2): 10 mg via ORAL
  Filled 2019-07-16 (×2): qty 1

## 2019-07-16 MED ORDER — SODIUM CHLORIDE 0.9 % IV SOLN: INTRAVENOUS | Status: DC

## 2019-07-16 MED ORDER — IPRATROPIUM-ALBUTEROL 0.5-2.5 (3) MG/3ML IN SOLN
3.0000 mL | Freq: Two times a day (BID) | RESPIRATORY_TRACT | Status: DC
  Administered 2019-07-17 – 2019-07-18 (×3): 3 mL via RESPIRATORY_TRACT
  Filled 2019-07-16 (×3): qty 3

## 2019-07-16 NOTE — ED Notes (Signed)
Pt lying in bed awake. Full monitor on. Pt asking for meds to help her relax during CT. NAD noted. Will continue to monitor.

## 2019-07-16 NOTE — ED Triage Notes (Signed)
Pt BIB [facility] from [place] [facility] with c/o N/V/D x 3 days, [facility] wanted her to be checked out for dehydration. Pt states that she wears 2L [place] at home all the time. Pt states she has felt weaker and has fallen twice today. Pt states after her fall she has increased pain in her right hip and has made it hard to walk. Pt normally walks with a walker. Pt endorses that she drove herself to the Dr.'s office for an evaluation. Pt is A&Ox4.

## 2019-07-16 NOTE — ED Notes (Signed)
Pt lying in bed. Full monitor on. Water given. Pt asked to try and ambulate. Pt responded with "that ain't gonna happen". NAD noted. Will continue to monitor. MD informed.

## 2019-07-16 NOTE — H&P (Signed)
History and Physical    MCKENZEE KUEBER [id number] DOB: Oct 07, 1935 DOA: 07/16/2019  PCP: [name], MD  Patient coming from: Home, lives with son and grand-daughter  I have personally briefly reviewed patient's old medical records in [place]  Chief Complaint: diarrhea, nausea and vomiting  HPI: [name] is a 84 y.o. female with medical history significant for COPD on chronic 2L, rheumatoid arthritis, hyperlipidemia, GERD and recent COVID infection 05/16/19 who presents with concerns of persistent diarrhea, nausea and vomiting.   Patient is hard of hearing and overall a poor historian. She started to have diarrhea and nausea and vomiting for the past 2 days. Has diarrhea and vomiting up to 5 times daily. No abdominal pain. Has decrease PO intake.Denies any dysuria. No shortness of breath. Has chronic cough. Subjective fever 2 days ago. No sick contact. No recent antibiotics. She also fell twice this morning. States she has been falling for the past several months.Feels right hip pain even though she fell on her left hip and left elbow. She just feels weak and "funny" prior to falling and cannot describe her symptoms otherwise. She uses a walker but bending over gives her back pain since she has hx of back surgery.   ED Course: She was afebrile and normotensive on home 2L via [place]. Leukocytosis of 14.6 and slight anemia with hemoglobin of 11.9. Sodium of 134, glucose of 107, creatinine of 1.21 which is elevated from prior of 0.81. Normal LFTs.  UA with large leukocytes and negative nitrates.   CXR shows patchy airspace disease in the periphery of left lung base suspicious for pneumonia.   Right hip X-ray with no fractures or dislocation.  She was given 1L of IV fluid and given Levaquin and ativan.   Review of Systems:  Constitutional: No Weight Change, No Fever ENT/Mouth: No sore throat, No Rhinorrhea Eyes: No Eye Pain, No Vision Changes Cardiovascular: No Chest  Pain, no SOB Respiratory: +Cough, No Sputum, No Wheezing, no Dyspnea  Gastrointestinal: No Nausea, No Vomiting, No Diarrhea, No Constipation, No Pain Genitourinary: no dysuria, increase frequent,urgency Musculoskeletal: No Arthralgias, No Myalgias Skin: No Skin Lesions, No Pruritus, Neuro: + Weakness, No Numbness,  No Loss of Consciousness, No Syncope Psych: No Anxiety/Panic, No Depression, no decrease appetite Heme/Lymph: No Bruising, No Bleeding  Past Medical History:  Diagnosis Date  . ACID REFLUX DISEASE 05/15/2007   Qualifier: Diagnosis of  By: [name] MD, Christena Deem   . Acute and chronic respiratory failure with hypoxia ([place])   . Acute on chronic respiratory failure with hypoxia ([place]) 06/08/2018  . Acute respiratory failure with hypercapnia ([place])   . Acute respiratory failure with hypoxia ([place]) 07/29/2017  . Anemia 05/20/2016  . Arthritis   . Bladder neoplasm   . Body mass index 34.0-34.9, adult   . Bronchitis 07/06/2011  . Chest pain 07/06/2011  . Chronic cough   . Chronic respiratory failure ([place])   . Community acquired pneumonia   . Constipation 07/03/2011  . COPD (chronic obstructive pulmonary disease) (H[facility])    PULMOLOGIST-  DR [name]--  GOLD III W/  CHRONIC RESPIRATORY FAILURE--  O2 DEPENDENT  . COPD with acute exacerbation ([place]) 06/29/2011  . Coronary arteriosclerosis   . Cough productive of purulent sputum   . Depression   . Dyslipidemia   . Falls frequently   . Fibromyalgia   . GERD (gastroesophageal reflux disease)   . Hearing loss in right ear   . Hematuria   . History of  nephritis    as child dx w/ Bright's disease (glomerulonephritis)  . HLD (hyperlipidemia) 05/15/2007   Qualifier: Diagnosis of  By: [name] MD, Christena Deem   . Hyperglycemia 05/20/2016  . Hyperlipidemia   . Hypomagnesemia   . Hypoxemia   . Iron deficiency anemia, unspecified   . Leukopenia   . Low vitamin D level   . Mild obstructive sleep apnea    no cpap recommendation  . Neuropathy 07/29/2017  .  Neuropathy, peripheral, idiopathic   . Osteopenia   . Other allergic rhinitis   . Pelvic pain in female   . Polyarthralgia   . PONV (postoperative nausea and vomiting)   . Pre-diabetes   . Rheumatoid arthritis ([place]) 07/29/2017  . Shoulder impingement syndrome 03/25/2015  . Sleep disorder, unspecified   . Spinal stenosis   . Stage 3 severe COPD by GOLD classification ([address]) 06/19/2007   Followed in Pulmonary clinic/ [facility]/ [place]  - hfa 90% 05/15/2012  - PFTs 05/15/2012 FEV1  0.61 ( 36%) ratio 57 and 21% better p B2 and DLCO 44%  106 corrected  - Referred to [facility] 05/15/12 - Rec pulmonary f/u prn 02/11/13    . Supplemental oxygen dependent    2L  via Nasal [place] --  activity and nighttime  . Urinary incontinence in female     Past Surgical History:  Procedure Laterality Date  . APPENDECTOMY  1971  . CARDIAC CATHETERIZATION  05-08-2007  dr Marlou Porch   minor coronary plaquing w/ no significant CAD/  20% mLAD,  10% mRCA,  perserved LV, ef 60-65%  . CATARACT EXTRACTION W/ INTRAOCULAR LENS  IMPLANT, BILATERAL  2001 approx  . CHOLECYSTECTOMY  1988  . CYSTOSCOPY W/ RETROGRADES Bilateral 09/24/2014   Procedure: CYSTOSCOPY WITH RETROGRADE PYELOGRAM;  Surgeon: Festus Aloe, MD;  Location[facility];  Service: Urology;  Laterality: Bilateral;  . CYSTOSCOPY WITH BIOPSY N/A 09/24/2014   Procedure: CYSTOSCOPY WITH BIOPSY;  Surgeon: Festus Aloe, MD;  Location: [facility];  Service: Urology;  Laterality: N/A;  . ESOPHAGOGASTRODUODENOSCOPY  last one 09-09-2008  . FULGURATION OF BLADDER TUMOR N/A 09/24/2014   Procedure: FULGURATION OF BLADDER TUMOR;  Surgeon: Festus Aloe, MD;  Location: [facility];  Service: Urology;  Laterality: N/A;  . [place]  . POSTERIOR LUMBAR FUSION  10-07-2007   bilateral laminectomy L4 and bilateral L4-5 diskectomy w/ fusion  . TONSILLECTOMY  as child  . TOTAL ABDOMINAL HYSTERECTOMY W/ BILATERAL  SALPINGOOPHORECTOMY  1973  . TRANSTHORACIC ECHOCARDIOGRAM  07-07-2011   normal echo,  ef 65-70%     reports that she quit smoking about 8 years ago. Her smoking use included cigarettes. She has a 180.00 pack-year smoking history. She has never used smokeless tobacco. She reports that she does not drink alcohol or use drugs.  Allergies  Allergen Reactions  . Kapidex [Dexlansoprazole] Other (See Comments)    Stomach pain, diarrhea  . Zegerid [Omeprazole] Other (See Comments)    Burning in chest    Family History  Problem Relation Age of Onset  . Asthma Maternal Grandmother   . Breast cancer Maternal Grandmother      Prior to Admission medications   Medication Sig Start Date End Date Taking? Authorizing Provider  acetaminophen (TYLENOL) 650 MG CR tablet Take 1,300 mg by mouth every 8 (eight) hours as needed for pain.   Yes [provider]  albuterol (PROVENTIL) (2.5 MG/3ML) 0.083% nebulizer solution Take 3 mLs (2.5 mg total) by  nebulization every 4 (four) hours as needed for wheezing or shortness of breath. For shortness of breath. 07/28/17  Yes Emokpae, Courage, MD  aspirin EC 81 MG tablet Take 81 mg by mouth daily.   Yes [provider]  atorvastatin (LIPITOR) 40 MG tablet Take 40 mg by mouth every morning.    Yes [provider]  azelastine (ASTELIN) 0.1 % nasal spray Place 1 spray into the nose 2 (two) times daily as needed for allergies.    Yes [provider]  calcium-vitamin D (OSCAL WITH D) 500-200 MG-UNIT tablet Take 2 tablets by mouth daily with breakfast. 06/11/18  Yes Georgette Shell, MD  carboxymethylcellulose (REFRESH PLUS) 0.5 % SOLN 2 drops 2 (two) times daily as needed (dry eye).   Yes [provider]  cetirizine (ZYRTEC) 10 MG tablet Take 1 tablet (10 mg total) by mouth every morning. 08/02/17  Yes Emokpae, Courage, MD  Cholecalciferol (VITAMIN D) 2000 UNITS tablet Take 4,000 Units by mouth daily.    Yes [provider]  cyanocobalamin 1000 MCG tablet Take 1 tablet (1,000 mcg total) by mouth daily. 08/02/17  Yes Emokpae, Courage, MD  esomeprazole (NEXIUM) 40 MG capsule Take 1 capsule (40 mg total) by mouth every morning. 08/02/17  Yes Emokpae, Courage, MD  folic acid (FOLVITE) 1 MG tablet Take 1 tablet (1 mg total) by mouth daily. 08/02/17  Yes Emokpae, Courage, MD  gabapentin (NEURONTIN) 100 MG capsule Take 2-3 capsules (200-300 mg total) by mouth 3 (three) times daily. 200 mg every AM and at noon and 300 mg QHS 08/02/17  Yes Emokpae, Courage, MD  hydroxychloroquine (PLAQUENIL) 200 MG tablet Take 200 mg by mouth daily.   Yes [provider]  ipratropium-albuterol (DUONEB) 0.5-2.5 (3) MG/3ML SOLN Take 3 mLs by nebulization 2 (two) times daily.   Yes [provider]  meloxicam (MOBIC) 15 MG tablet Take 15 mg by mouth daily. 04/24/18  Yes [provider]  polyethylene glycol (MIRALAX / GLYCOLAX) packet Take 17 g by mouth daily. Patient taking differently: Take 17 g by mouth daily as needed for moderate constipation.  08/03/17  Yes Emokpae, Courage, MD  polyvinyl alcohol (LIQUIFILM TEARS) 1.4 % ophthalmic solution Place 2 drops into both eyes as needed for dry eyes. 08/02/17  Yes Emokpae, Courage, MD  sertraline (ZOLOFT) 100 MG tablet Take 100 mg by mouth daily.   Yes [provider]  TRELEGY ELLIPTA 100-62.5-25 MCG/INH AEPB Take 1 puff by mouth daily. 06/30/19  Yes [provider]  acetaminophen (TYLENOL) 325 MG tablet Take 2 tablets (650 mg total) by mouth every 6 (six) hours as needed for mild pain (or Fever >/= 101). Patient not taking: Reported on 07/16/2019 08/02/17   Roxan Hockey, MD  budesonide-formoterol [facility]) 160-4.5 MCG/ACT inhaler Inhale 2 puffs into the lungs 2 (two) times daily. Patient not taking: Reported on 07/16/2019 08/02/17   Roxan Hockey, MD  mupirocin ointment (BACTROBAN) 2 % Place 1 application into the nose 2 (two) times  daily. Patient not taking: Reported on 07/16/2019 06/10/18   Georgette Shell, MD  tiotropium [facility]) 18 MCG inhalation capsule Place 1 capsule (18 mcg total) into inhaler and inhale every morning. Patient not taking: Reported on 07/16/2019 08/02/17   Roxan Hockey, MD    Physical Exam: Vitals:   07/16/19 1738 07/16/19 1900 07/16/19 1930 07/16/19 2100  BP: (!) 139/56 (!) 116/50 (!) 141/59 105/88  Pulse: 95 74 75 90  Resp: 17 20 19 20   Temp:  SpO2: 100% 96% 98% 95%  Weight:      Height:        Constitutional: NAD, calm, comfortable, elderly female laying flat in bed and has hearing impairment without her hearing aids Vitals:   07/16/19 1738 07/16/19 1900 07/16/19 1930 07/16/19 2100  BP: (!) 139/56 (!) 116/50 (!) 141/59 105/88  Pulse: 95 74 75 90  Resp: 17 20 19 20   Temp:      SpO2: 100% 96% 98% 95%  Weight:      Height:       Eyes: PERRL, lids and conjunctivae normal ENMT: Mucous membranes are moist.  Neck: normal, supple Respiratory: clear to auscultation bilaterally, no wheezing, no crackles. Normal respiratory effort on 2L via [place]. No accessory muscle use.  Cardiovascular: Regular rate and rhythm, no murmurs / rubs / gallops. No extremity edema. 2+ pedal pulses.  Abdomen: no tenderness, no masses palpated.  Bowel sounds positive.  Musculoskeletal: no clubbing / cyanosis. No joint deformity upper and lower extremities. Good ROM, no contractures. Normal muscle tone.  Skin: no rashes, lesions, ulcers. No induration Neurologic: CN 2-12 grossly intact. Sensation intact. Strength 4/5 in all 4. Weak hand grip.   Psychiatric: Normal judgment and insight. Alert and oriented x 3. Normal mood.     Labs on Admission: I have personally reviewed following labs and imaging studies  CBC: Recent Labs  Lab 07/16/19 1728  WBC 14.6*  NEUTROABS 12.0*  HGB 11.9*  HCT 37.8  MCV 93.6  PLT [id number]   Basic Metabolic Panel: Recent Labs  Lab 07/16/19 1728  NA 134*  K 3.5  CL  95*  CO2 28  GLUCOSE 107*  BUN 32*  CREATININE 1.21*  CALCIUM 8.6*   GFR: Estimated Creatinine Clearance: 32.1 mL/min (A) (by C-G formula based on SCr of 1.21 mg/dL (H)). Liver Function Tests: Recent Labs  Lab 07/16/19 1728  AST 14*  ALT 13  ALKPHOS 59  BILITOT 0.7  PROT 7.4  ALBUMIN 3.5   Recent Labs  Lab 07/16/19 1728  LIPASE 27   No results for input(s): AMMONIA in the last 168 hours. Coagulation Profile: No results for input(s): INR, PROTIME in the last 168 hours. Cardiac Enzymes: No results for input(s): CKTOTAL, CKMB, CKMBINDEX, TROPONINI in the last 168 hours. BNP (last 3 results) No results for input(s): PROBNP in the last 8760 hours. HbA1C: No results for input(s): HGBA1C in the last 72 hours. CBG: No results for input(s): GLUCAP in the last 168 hours. Lipid Profile: No results for input(s): CHOL, HDL, LDLCALC, TRIG, CHOLHDL, LDLDIRECT in the last 72 hours. Thyroid Function Tests: No results for input(s): TSH, T4TOTAL, FREET4, T3FREE, THYROIDAB in the last 72 hours. Anemia Panel: No results for input(s): VITAMINB12, FOLATE, FERRITIN, TIBC, IRON, RETICCTPCT in the last 72 hours. Urine analysis:    Component Value Date/Time   COLORURINE YELLOW 07/16/2019 1701   APPEARANCEUR HAZY (A) 07/16/2019 1701   LABSPEC 1.013 07/16/2019 1701   PHURINE 5.0 07/16/2019 1701   GLUCOSEU NEGATIVE 07/16/2019 1701   HGBUR NEGATIVE 07/16/2019 1701   BILIRUBINUR NEGATIVE 07/16/2019 1701   BILIRUBINUR neg 08/17/2013 1446   KETONESUR NEGATIVE 07/16/2019 1701   PROTEINUR NEGATIVE 07/16/2019 1701   UROBILINOGEN 0.2 08/17/2013 1446   UROBILINOGEN 0.2 07/06/2011 1519   NITRITE NEGATIVE 07/16/2019 1701   LEUKOCYTESUR LARGE (A) 07/16/2019 1701    Radiological Exams on Admission: CT Hip Right Wo Contrast  Result Date: 07/16/2019 CLINICAL DATA:  Limping, acute hip pain after fall EXAM: CT  OF THE RIGHT HIP WITHOUT CONTRAST TECHNIQUE: Multidetector CT imaging of the right hip was  performed according to the standard protocol. Multiplanar CT image reconstructions were also generated. COMPARISON:  Radiograph same day FINDINGS: Bones/Joint/Cartilage No fracture or dislocation. Diffuse osteopenia seen. Moderate femoroacetabular joint osteoarthritis is seen with superior joint space loss and marginal osteophyte formation. Enthesophytes seen at the greater trochanter. No large hip joint effusion is seen. Enthesophytes are also seen at the posterior ischial tuberosity. Ligaments Suboptimally assessed by CT. Muscles and Tendons The muscles surrounding the hip appear to be intact without focal atrophy or tear. The tendons are grossly intact. Soft tissues Mild soft tissue swelling seen over the lateral aspect of the hip. No soft tissue hematoma. There is scattered colonic diverticula. The visualized portion of the bladder is unremarkable. Scattered vascular calcifications are noted. IMPRESSION: No acute osseous abnormality. Moderate femoroacetabular joint osteoarthritis. Electronically Signed   By: Prudencio Pair M.D.   On: 07/16/2019 22:06   DG Chest Port 1 View  Result Date: 07/16/2019 CLINICAL DATA:  hip pain, possible fall EXAM: PORTABLE CHEST 1 VIEW COMPARISON:  Radiograph 05/15/2019 FINDINGS: Normal heart size and mediastinal contours. Aortic atherosclerosis. Patchy airspace disease in the periphery of the left lung base, new from prior. No other focal consolidation. No pulmonary edema. No pneumothorax. No visualized pleural effusion. No acute osseous abnormalities are seen. IMPRESSION: Patchy airspace disease in the periphery of the left lung base, suspicious for pneumonia. Recommend continued radiographic follow-up after course of treatment to ensure resolution. Electronically Signed   By: Keith Rake M.D.   On: 07/16/2019 17:41   DG Hip Unilat With Pelvis 2-3 Views Right  Result Date: 07/16/2019 CLINICAL DATA:  Right hip pain for 3 days. Possible fall. EXAM: DG HIP (WITH OR WITHOUT  PELVIS) 2-3V RIGHT COMPARISON:  None. FINDINGS: The cortical margins of the bony pelvis and right hip are intact. No fracture. Pubic symphysis and sacroiliac joints are congruent. Right hip joint space narrowing with mild acetabular spurring. No evidence of a vascular necrosis. No visualized focal bone lesion. Surgical hardware in the lower lumbar spine is partially included. Bones are under mineralized. IMPRESSION: Mild osteoarthritis of the right hip. No acute fracture. Electronically Signed   By: Keith Rake M.D.   On: 07/16/2019 17:40      Assessment/Plan  Diarrhea/N/V possibly secondary to viral gastroenteritis  Check C.diff continue fluids for hydration clear liquid diet   AKI received 1L ns bolus in the ED monitor and avoid nephrotoxic agents pt has daily Mobic on med list- will hold  Frequent falls PT evaluation  Chronic hypoxic respiratory failure in the setting of COPD no increase O2 requirement or symptoms to suggest pneumonia despite CXR findings will discontinue antibiotics  Continue bronchodilator  Rheumatoid arthritis  hold Plaquenil for now during infection  GERD continue PPI  Hyperlipidemia continue statin  Recent COVID infection Tested positive 05/16/19 Pt is still within 3 month window and will be positive regardless. Does not recommend re-testing or any respiratory isolation or precaution.   DVT prophylaxis:.Lovenox Code Status: Full Family Communication: Plan discussed with patient at bedside  disposition Plan: Home with at least 2 midnight stays  Consults called:  Admission status: inpatient   Ramsey Midgett T Darthula Desa DO [facility]   If 7PM-7AM, please contact night-coverage [email or web address]   07/16/2019, 11:00 PM

## 2019-07-16 NOTE — ED Provider Notes (Signed)
[facility] DEPT Provider Note   CSN: [id number] Arrival date & time: 07/16/19  1613     History Chief Complaint  Patient presents with  . Emesis  . Diarrhea  . Nausea  . Hip Pain    [name] is a 84 y.o. female.  [name] is an 84 year old woman who presents to the ED with 3 days of vomiting and diarrhea.  She is a previous medical history significant for COPD with a home oxygen requirement of 2 L.  She reports that she started experiencing nausea with vomiting about 3 days ago.  At that time, she reported a temperature of 103.  She also been having very loose stools in the same time.  She believes she has had roughly 5-6 episodes of NB/NB emesis.  She is not experiencing stomach pain.  She is not currently nauseated.  Her bowel movements have been loose without obvious blood or mucus.  She reports significantly decreased energy but no big decrease in appetite.  She feels subjectively dehydrated.  She began to experience significant right hip pain about 2 days ago.  She is unsure if this is related to a fall that she has had multiple falls without head trauma in the past several days.        Past Medical History:  Diagnosis Date  . ACID REFLUX DISEASE 05/15/2007   Qualifier: Diagnosis of  By: [name] MD, Christena Deem   . Acute and chronic respiratory failure with hypoxia ([place])   . Acute on chronic respiratory failure with hypoxia ([place]) 06/08/2018  . Acute respiratory failure with hypercapnia ([place])   . Acute respiratory failure with hypoxia ([place]) 07/29/2017  . Anemia 05/20/2016  . Arthritis   . Bladder neoplasm   . Body mass index 34.0-34.9, adult   . Bronchitis 07/06/2011  . Chest pain 07/06/2011  . Chronic cough   . Chronic respiratory failure ([place])   . Community acquired pneumonia   . Constipation 07/03/2011  . COPD (chronic obstructive pulmonary disease) (HCC)    PULMOLOGIST-  DR [name]--  GOLD III W/  CHRONIC RESPIRATORY FAILURE--  O2 DEPENDENT   . COPD with acute exacerbation ([place]) 06/29/2011  . Coronary arteriosclerosis   . Cough productive of purulent sputum   . Depression   . Dyslipidemia   . Falls frequently   . Fibromyalgia   . GERD (gastroesophageal reflux disease)   . Hearing loss in right ear   . Hematuria   . History of nephritis    as child dx w/ Bright's disease (glomerulonephritis)  . HLD (hyperlipidemia) 05/15/2007   Qualifier: Diagnosis of  By: [name] MD, Christena Deem   . Hyperglycemia 05/20/2016  . Hyperlipidemia   . Hypomagnesemia   . Hypoxemia   . Iron deficiency anemia, unspecified   . Leukopenia   . Low vitamin D level   . Mild obstructive sleep apnea    no cpap recommendation  . Neuropathy 07/29/2017  . Neuropathy, peripheral, idiopathic   . Osteopenia   . Other allergic rhinitis   . Pelvic pain in female   . Polyarthralgia   . PONV (postoperative nausea and vomiting)   . Pre-diabetes   . Rheumatoid arthritis ([place]) 07/29/2017  . Shoulder impingement syndrome 03/25/2015  . Sleep disorder, unspecified   . Spinal stenosis   . Stage 3 severe COPD by GOLD classification ([place]) 06/19/2007   Followed in Pulmonary clinic/ [facility]/ [place]  - hfa 90% 05/15/2012  - PFTs 05/15/2012 FEV1  0.61 ( 36%) ratio 57 and 21% better p B2 and DLCO 44%  106 corrected  - Referred to [facility] 05/15/12 - Rec pulmonary f/u prn 02/11/13    . Supplemental oxygen dependent    2L  via Nasal Canula --  activity and nighttime  . Urinary incontinence in female     Patient Active Problem List   Diagnosis Date Noted  . COVID-19 virus infection 05/16/2019  . Community acquired pneumonia   . Acute on chronic respiratory failure with hypoxia ([place]) 06/08/2018  . Acute respiratory failure with hypercapnia ([place])   . COPD exacerbation ([place]) 07/29/2017  . Rheumatoid arthritis ([place]) 07/29/2017  . Neuropathy 07/29/2017  . Acute respiratory failure with hypoxia ([place]) 07/29/2017  . Anemia 05/20/2016  . Hyperglycemia 05/20/2016  . Shoulder  impingement syndrome 03/25/2015  . Chronic respiratory failure ([place]) 04/10/2012  . Constipation 07/03/2011  . COPD with acute exacerbation ([place]) 06/29/2011  . Stage 3 severe COPD by GOLD classification ([place]) 06/19/2007  . HLD (hyperlipidemia) 05/15/2007  . ACID REFLUX DISEASE 05/15/2007    Past Surgical History:  Procedure Laterality Date  . APPENDECTOMY  1971  . CARDIAC CATHETERIZATION  05-08-2007  dr Marlou Porch   minor coronary plaquing w/ no significant CAD/  20% mLAD,  10% mRCA,  perserved LV, ef 60-65%  . CATARACT EXTRACTION W/ INTRAOCULAR LENS  IMPLANT, BILATERAL  2001 approx  . CHOLECYSTECTOMY  1988  . CYSTOSCOPY W/ RETROGRADES Bilateral 09/24/2014   Procedure: CYSTOSCOPY WITH RETROGRADE PYELOGRAM;  Surgeon: Festus Aloe, MD;  Location: [facility];  Service: Urology;  Laterality: Bilateral;  . CYSTOSCOPY WITH BIOPSY N/A 09/24/2014   Procedure: CYSTOSCOPY WITH BIOPSY;  Surgeon: Festus Aloe, MD;  Location: [facility];  Service: Urology;  Laterality: N/A;  . ESOPHAGOGASTRODUODENOSCOPY  last one 09-09-2008  . FULGURATION OF BLADDER TUMOR N/A 09/24/2014   Procedure: FULGURATION OF BLADDER TUMOR;  Surgeon: Festus Aloe, MD;  Location: [facility];  Service: Urology;  Laterality: N/A;  . [place]  . POSTERIOR LUMBAR FUSION  10-07-2007   bilateral laminectomy L4 and bilateral L4-5 diskectomy w/ fusion  . TONSILLECTOMY  as child  . TOTAL ABDOMINAL HYSTERECTOMY W/ BILATERAL SALPINGOOPHORECTOMY  1973  . TRANSTHORACIC ECHOCARDIOGRAM  07-07-2011   normal echo,  ef 65-70%     OB History   No obstetric history on file.     Family History  Problem Relation Age of Onset  . Asthma Maternal Grandmother   . Breast cancer Maternal Grandmother     Social History   Tobacco Use  . Smoking status: Former Smoker    Packs/day: 3.00    Years: 60.00    Pack years: 180.00    Types: Cigarettes    Quit date: 06/25/2011     Years since quitting: 8.0  . Smokeless tobacco: Never Used  Substance Use Topics  . Alcohol use: No  . Drug use: No    Home Medications Prior to Admission medications   Medication Sig Start Date End Date Taking? Authorizing Provider  acetaminophen (TYLENOL) 325 MG tablet Take 2 tablets (650 mg total) by mouth every 6 (six) hours as needed for mild pain (or Fever >/= 101). 08/02/17   Emokpae, Courage, MD  albuterol (PROVENTIL HFA;VENTOLIN HFA) 108 (90 BASE) MCG/ACT inhaler Inhale 2 puffs into the lungs every 6 (six) hours as needed. For shortness of breath.    [provider]  albuterol (PROVENTIL) (2.5 MG/3ML) 0.083% nebulizer solution Take 3 mLs (2.5  mg total) by nebulization every 4 (four) hours as needed for wheezing or shortness of breath. For shortness of breath. 07/28/17   Roxan Hockey, MD  aspirin EC 81 MG tablet Take 81 mg by mouth daily.    [provider]  atorvastatin (LIPITOR) 40 MG tablet Take 40 mg by mouth every morning.     [provider]  azelastine (ASTELIN) 0.1 % nasal spray Place 1 spray into the nose 2 (two) times daily.    [provider]  budesonide-formoterol (SYMBICORT) 160-4.5 MCG/ACT inhaler Inhale 2 puffs into the lungs 2 (two) times daily. 08/02/17   Roxan Hockey, MD  Calcium Carbonate-Vitamin D (CALCIUM + D PO) Take 2 tablets by mouth daily.     [provider]  calcium-vitamin D (OSCAL WITH D) 500-200 MG-UNIT tablet Take 2 tablets by mouth daily with breakfast. 06/11/18   Georgette Shell, MD  carboxymethylcellulose (REFRESH PLUS) 0.5 % SOLN 2 drops 2 (two) times daily as needed (dry eye).    [provider]  cetirizine (ZYRTEC) 10 MG tablet Take 1 tablet (10 mg total) by mouth every morning. 08/02/17   Roxan Hockey, MD  Cholecalciferol (VITAMIN D) 2000 UNITS tablet Take 4,000 Units by mouth daily.     [provider]  cyanocobalamin 1000 MCG tablet Take 1 tablet (1,000 mcg total) by mouth  daily. 08/02/17   Roxan Hockey, MD  esomeprazole (NEXIUM) 40 MG capsule Take 1 capsule (40 mg total) by mouth every morning. 08/02/17   Roxan Hockey, MD  folic acid (FOLVITE) 1 MG tablet Take 1 tablet (1 mg total) by mouth daily. 08/02/17   Roxan Hockey, MD  gabapentin (NEURONTIN) 100 MG capsule Take 2-3 capsules (200-300 mg total) by mouth 3 (three) times daily. 200 mg every AM and at noon and 300 mg QHS 08/02/17   Roxan Hockey, MD  hydroxychloroquine (PLAQUENIL) 200 MG tablet Take 200 mg by mouth daily.    [provider]  ipratropium-albuterol (DUONEB) 0.5-2.5 (3) MG/3ML SOLN Take 3 mLs by nebulization 2 (two) times daily.    [provider]  meloxicam (MOBIC) 15 MG tablet Take 15 mg by mouth daily. 04/24/18   [provider]  methotrexate (RHEUMATREX) 2.5 MG tablet Take 15 mg by mouth once a week.  06/05/17   [provider]  mupirocin ointment (BACTROBAN) 2 % Place 1 application into the nose 2 (two) times daily. 06/10/18   Georgette Shell, MD  polyethylene glycol [facility] / Floria Raveling) packet Take 17 g by mouth daily. 08/03/17   Roxan Hockey, MD  polyvinyl alcohol (LIQUIFILM TEARS) 1.4 % ophthalmic solution Place 2 drops into both eyes as needed for dry eyes. 08/02/17   Roxan Hockey, MD  sertraline (ZOLOFT) 100 MG tablet Take 100 mg by mouth daily.    [provider]  tiotropium (SPIRIVA) 18 MCG inhalation capsule Place 1 capsule (18 mcg total) into inhaler and inhale every morning. 08/02/17   Roxan Hockey, MD  vitamin A 10000 UNIT capsule Take 10,000 Units by mouth daily.    [provider]    Allergies    Kapidex [dexlansoprazole] and Zegerid [omeprazole]  Review of Systems   Review of Systems  Constitutional: Positive for fatigue and fever. Negative for chills.  HENT: Negative for congestion.   Respiratory: Positive for cough. Negative for chest tightness, shortness of breath and wheezing.   Cardiovascular:  Positive for palpitations. Negative for chest pain and leg swelling.  Gastrointestinal: Positive for diarrhea and vomiting. Negative  for abdominal pain, constipation and nausea.  Endocrine: Negative for polyuria.  Genitourinary: Negative for dysuria.  Musculoskeletal: Positive for arthralgias (right hip).  Skin: Negative for rash and wound.  Neurological: Positive for weakness.  Psychiatric/Behavioral: Negative for confusion.    Physical Exam Updated Vital Signs BP (!) 108/94   Pulse 86   Temp 98.3 F (36.8 C)   Resp (!) 21   Ht 5' (1.524 m)   Wt 71.2 kg   SpO2 99%   BMI 30.66 kg/m   Physical Exam Constitutional:      General: She is not in acute distress.    Appearance: Normal appearance. She is normal weight. She is not ill-appearing.     Comments: Continues to complain of being cold despite numerous warm blankets being piled on top.  HENT:     Head: Normocephalic and atraumatic.     Nose: Nose normal. No congestion.     Mouth/Throat:     Mouth: Mucous membranes are dry.  Eyes:     Extraocular Movements: Extraocular movements intact.     Conjunctiva/sclera: Conjunctivae normal.     Pupils: Pupils are equal, round, and reactive to light.  Cardiovascular:     Rate and Rhythm: Normal rate and regular rhythm.     Pulses: Normal pulses.     Heart sounds: Normal heart sounds.  Pulmonary:     Effort: Pulmonary effort is normal.     Breath sounds: Rales present.  Abdominal:     General: Abdomen is flat. Bowel sounds are normal. There is no distension.     Palpations: Abdomen is soft.     Tenderness: There is no abdominal tenderness. There is no guarding.  Musculoskeletal:        General: Normal range of motion.     Cervical back: Normal range of motion and neck supple.  Skin:    General: Skin is warm and dry.  Neurological:     General: No focal deficit present.     Mental Status: She is alert and oriented to person, place, and time. Mental status is at baseline.      Cranial Nerves: No cranial nerve deficit.     Motor: No weakness.     ED Results / Procedures / Treatments   Labs (all labs ordered are listed, but only abnormal results are displayed) Labs Reviewed  LIPASE, BLOOD  COMPREHENSIVE METABOLIC PANEL  CBC WITH DIFFERENTIAL/PLATELET  URINALYSIS, ROUTINE W REFLEX MICROSCOPIC    EKG None  Radiology No results found.  Procedures Procedures (including critical care time)  Medications Ordered in ED Medications  sodium chloride 0.9 % bolus 1,000 mL (has no administration in time range)    ED Course  I have reviewed the triage vital signs and the nursing notes.  Pertinent labs & imaging results that were available during my care of the patient were reviewed by me and considered in my medical decision making (see chart for details).    MDM Rules/Calculators/A&P                      Ms. Manfra is an 84 year old woman who presents to the ED with 3 days of vomiting and diarrhea.  No significant abdominal pain.  Differential diagnosis includes viral GI bug.  We will rule out cholecystitis with LFTs, and pancreatitis -  low suspicion based on lack of abdominal pain.  Low suspicion for systemic illness based on vitals.  Some dehydration noted on exam.  We will begin  with basic lab work, chest x-ray, UA and provide fluid resuscitation.  Her hip pain is her most bothersome symptom at the moment.  She has had recent falls.  We will obtain hip x-ray to rule out fracture.  Hip x-ray shows no evidence of fracture.  She is unable to walk at all due to significant pain which seems to be primarily superficial as opposed to deep hip/pelvic pain.  We will follow-up with CT hip to ensure no fracture.   Final Clinical Impression(s) / ED Diagnoses Final diagnoses:  None    Rx / DC Orders ED Discharge Orders    None       Matilde Haymaker, MD 07/16/19 2107    Margette Fast, MD 07/20/19 [phone]

## 2019-07-16 NOTE — ED Notes (Signed)
Pt placed on purewick. When asked about walking to the bathroom the patient stated she does not feel comfortable walking

## 2019-07-16 NOTE — ED Notes (Signed)
Pt sitting up in bed watching t.v. crackers given. Will continue to monitor.

## 2019-07-17 ENCOUNTER — Other Ambulatory Visit: Payer: Self-pay

## 2019-07-17 DIAGNOSIS — R197 Diarrhea, unspecified: Secondary | ICD-10-CM

## 2019-07-17 LAB — COMPREHENSIVE METABOLIC PANEL
ALT: 11 U/L (ref 0–44)
AST: 12 U/L — ABNORMAL LOW (ref 15–41)
Albumin: 2.7 g/dL — ABNORMAL LOW (ref 3.5–5.0)
Alkaline Phosphatase: 47 U/L (ref 38–126)
Anion gap: 9 (ref 5–15)
BUN: 20 mg/dL (ref 8–23)
CO2: 27 mmol/L (ref 22–32)
Calcium: 8.3 mg/dL — ABNORMAL LOW (ref 8.9–10.3)
Chloride: 100 mmol/L (ref 98–111)
Creatinine, Ser: 0.76 mg/dL (ref 0.44–1.00)
GFR calc Af Amer: >60 mL/min (ref >60)
GFR calc non Af Amer: >60 mL/min (ref >60)
Glucose, Bld: 101 mg/dL — ABNORMAL HIGH (ref 70–99)
Potassium: 3.7 mmol/L (ref 3.5–5.1)
Sodium: 136 mmol/L (ref 135–145)
Total Bilirubin: 0.9 mg/dL (ref 0.3–1.2)
Total Protein: 6 g/dL — ABNORMAL LOW (ref 6.5–8.1)

## 2019-07-17 LAB — CBC
HCT: 33 % — ABNORMAL LOW (ref 36.0–46.0)
Hemoglobin: 10.5 g/dL — ABNORMAL LOW (ref 12.0–15.0)
MCH: 30.2 pg (ref 26.0–34.0)
MCHC: 31.8 g/dL (ref 30.0–36.0)
MCV: 94.8 fL (ref 80.0–100.0)
Platelets: 312 10*3/uL (ref 150–400)
RBC: 3.48 MIL/uL — ABNORMAL LOW (ref 3.87–5.11)
RDW: 14.2 % (ref 11.5–15.5)
WBC: 11 10*3/uL — ABNORMAL HIGH (ref 4.0–10.5)
nRBC: 0 % (ref 0.0–0.2)

## 2019-07-17 MED ORDER — FLUTICASONE FUROATE-VILANTEROL 100-25 MCG/INH IN AEPB
1.0000 | INHALATION_SPRAY | Freq: Every day | RESPIRATORY_TRACT | Status: DC
  Administered 2019-07-17 – 2019-07-18 (×2): 1 via RESPIRATORY_TRACT
  Filled 2019-07-17: qty 28

## 2019-07-17 MED ORDER — ACETAMINOPHEN 325 MG PO TABS
650.0000 mg | ORAL_TABLET | Freq: Four times a day (QID) | ORAL | Status: DC | PRN
  Administered 2019-07-17 – 2019-07-18 (×3): 650 mg via ORAL
  Filled 2019-07-17 (×3): qty 2

## 2019-07-17 MED ORDER — GABAPENTIN 300 MG PO CAPS
300.0000 mg | ORAL_CAPSULE | Freq: Every day | ORAL | Status: DC
  Filled 2019-07-17: qty 1

## 2019-07-17 MED ORDER — UMECLIDINIUM BROMIDE 62.5 MCG/INH IN AEPB
1.0000 | INHALATION_SPRAY | Freq: Every day | RESPIRATORY_TRACT | Status: DC
  Administered 2019-07-17 – 2019-07-18 (×2): 1 via RESPIRATORY_TRACT
  Filled 2019-07-17: qty 7

## 2019-07-17 NOTE — Progress Notes (Signed)
PROGRESS NOTE    [name]  [id number] DOB: December 10, 1935 DOA: 07/16/2019 PCP: [name], MD     Brief Narrative:  [name] is a 84 y.o. female with medical history significant for COPD on chronic 2L, rheumatoid arthritis, hyperlipidemia, GERD and recent COVID infection 05/16/19 who presents with concerns of persistent diarrhea, nausea and vomiting. She started to have diarrhea and nausea and vomiting for the past 2 days. Has diarrhea and vomiting up to 5 times daily. Also complains of weakness and falls at home. She was admitted with AKI and suspected gastroenteritis.   New events last 24 hours / Subjective: States she has not had any further nausea, vomiting or diarrhea since admission.  Overall feeling somewhat better.  Assessment & Plan:   Principal Problem:   Diarrhea Active Problems:   HLD (hyperlipidemia)   Stage 3 severe COPD by GOLD classification (HCC)   Chronic respiratory failure (HCC)   Rheumatoid arthritis (HCC)   AKI (acute kidney injury) ([place])   Falls frequently   Nausea, vomiting, diarrhea suspect secondary to viral gastroenteritis -Has not had a bowel movement since admission, denies any watery stools just loose.  Discontinue C. difficile test -WBC improving -Advance diet today, continue IV fluids until oral intake adequate  AKI -Resolved  Fall at home -Right hip x-ray showed mild osteoarthritis without acute fracture -Right hip CT showed no acute osseous abnormality -PT evaluation  Chronic hypoxemic respiratory failure in setting of COPD -Requires nasal cannula O2 at baseline -No respiratory symptoms to indicate antibiotic needs at this point.  Stop Levaquin -Continue home inhalers -Continue to monitor  Rheumatoid arthritis -Hold Plaquenil in setting of acute illness  GERD -Continue PPI  Hyperlipidemia -Continue statin  Depression -Continue Zoloft  Recent Covid infection -Tested positive 05/16/2019.  Patient within  67-month window, does not need to be retested or requires isolation/precaution   DVT prophylaxis: Lovenox Code Status: Full Family Communication: None at bedside Disposition Plan:  . Patient is from home prior to admission. . Currently in-hospital treatment needed due to N/V/D and advancing diet, tolerance of PO intake. . Suspect patient will discharge home in 1 days.    Consultants:   none  Procedures:   none  Antimicrobials:  Anti-infectives (From admission, onward)   Start     Dose/Rate Route Frequency Ordered Stop   07/16/19 1845  levofloxacin (LEVAQUIN) tablet 750 mg     750 mg Oral  Once 07/16/19 1841 07/16/19 1919       Objective: Vitals:   07/17/19 0157 07/17/19 0615 07/17/19 0916 07/17/19 0920  BP: (!) 132/53 (!) 143/64    Pulse: 78 84    Resp: 20 18    Temp: 98.4 F (36.9 C) 98.1 F (36.7 C)    TempSrc: Oral Oral    SpO2: 97% 94% 95% 95%  Weight: 72.8 kg     Height:        Intake/Output Summary (Last 24 hours) at 07/17/2019 1036 Last data filed at 07/17/2019 0807 Gross per 24 hour  Intake 1897.83 ml  Output 550 ml  Net 1347.83 ml   Filed Weights   07/16/19 1641 07/17/19 0157  Weight: 71.2 kg 72.8 kg    Examination:  General exam: Appears calm and comfortable  Respiratory system: Clear to auscultation. Respiratory effort normal. No respiratory distress. No conversational dyspnea.  Cardiovascular system: S1 & S2 heard, RRR. No murmurs. No pedal edema. Gastrointestinal system: Abdomen is nondistended, soft and nontender. Normal bowel sounds heard. Central nervous  system: Alert and oriented. No focal neurological deficits. Speech clear.  Extremities: Symmetric in appearance  Skin: No rashes, lesions or ulcers on exposed skin  Psychiatry: Judgement and insight appear normal. Mood & affect appropriate.   Data Reviewed: I have personally reviewed following labs and imaging studies  CBC: Recent Labs  Lab 07/16/19 1728 07/17/19 0456  WBC 14.6*  11.0*  NEUTROABS 12.0*  --   HGB 11.9* 10.5*  HCT 37.8 33.0*  MCV 93.6 94.8  PLT 360 [id number]   Basic Metabolic Panel: Recent Labs  Lab 07/16/19 1728 07/17/19 0456  NA 134* 136  K 3.5 3.7  CL 95* 100  CO2 28 27  GLUCOSE 107* 101*  BUN 32* 20  CREATININE 1.21* 0.76  CALCIUM 8.6* 8.3*   GFR: Estimated Creatinine Clearance: 49.1 mL/min (by C-G formula based on SCr of 0.76 mg/dL). Liver Function Tests: Recent Labs  Lab 07/16/19 1728 07/17/19 0456  AST 14* 12*  ALT 13 11  ALKPHOS 59 47  BILITOT 0.7 0.9  PROT 7.4 6.0*  ALBUMIN 3.5 2.7*   Recent Labs  Lab 07/16/19 1728  LIPASE 27   No results for input(s): AMMONIA in the last 168 hours. Coagulation Profile: No results for input(s): INR, PROTIME in the last 168 hours. Cardiac Enzymes: No results for input(s): CKTOTAL, CKMB, CKMBINDEX, TROPONINI in the last 168 hours. BNP (last 3 results) No results for input(s): PROBNP in the last 8760 hours. HbA1C: No results for input(s): HGBA1C in the last 72 hours. CBG: No results for input(s): GLUCAP in the last 168 hours. Lipid Profile: No results for input(s): CHOL, HDL, LDLCALC, TRIG, CHOLHDL, LDLDIRECT in the last 72 hours. Thyroid Function Tests: No results for input(s): TSH, T4TOTAL, FREET4, T3FREE, THYROIDAB in the last 72 hours. Anemia Panel: No results for input(s): VITAMINB12, FOLATE, FERRITIN, TIBC, IRON, RETICCTPCT in the last 72 hours. Sepsis Labs: No results for input(s): PROCALCITON, LATICACIDVEN in the last 168 hours.  No results found for this or any previous visit (from the past 240 hour(s)).    Radiology Studies: CT Hip Right Wo Contrast  Result Date: 07/16/2019 CLINICAL DATA:  Limping, acute hip pain after fall EXAM: CT OF THE RIGHT HIP WITHOUT CONTRAST TECHNIQUE: Multidetector CT imaging of the right hip was performed according to the standard protocol. Multiplanar CT image reconstructions were also generated. COMPARISON:  Radiograph same day FINDINGS:  Bones/Joint/Cartilage No fracture or dislocation. Diffuse osteopenia seen. Moderate femoroacetabular joint osteoarthritis is seen with superior joint space loss and marginal osteophyte formation. Enthesophytes seen at the greater trochanter. No large hip joint effusion is seen. Enthesophytes are also seen at the posterior ischial tuberosity. Ligaments Suboptimally assessed by CT. Muscles and Tendons The muscles surrounding the hip appear to be intact without focal atrophy or tear. The tendons are grossly intact. Soft tissues Mild soft tissue swelling seen over the lateral aspect of the hip. No soft tissue hematoma. There is scattered colonic diverticula. The visualized portion of the bladder is unremarkable. Scattered vascular calcifications are noted. IMPRESSION: No acute osseous abnormality. Moderate femoroacetabular joint osteoarthritis. Electronically Signed   By: Prudencio Pair M.D.   On: 07/16/2019 22:06   DG Chest Port 1 View  Result Date: 07/16/2019 CLINICAL DATA:  hip pain, possible fall EXAM: PORTABLE CHEST 1 VIEW COMPARISON:  Radiograph 05/15/2019 FINDINGS: Normal heart size and mediastinal contours. Aortic atherosclerosis. Patchy airspace disease in the periphery of the left lung base, new from prior. No other focal consolidation. No pulmonary edema. No pneumothorax. No  visualized pleural effusion. No acute osseous abnormalities are seen. IMPRESSION: Patchy airspace disease in the periphery of the left lung base, suspicious for pneumonia. Recommend continued radiographic follow-up after course of treatment to ensure resolution. Electronically Signed   By: Keith Rake M.D.   On: 07/16/2019 17:41   DG Hip Unilat With Pelvis 2-3 Views Right  Result Date: 07/16/2019 CLINICAL DATA:  Right hip pain for 3 days. Possible fall. EXAM: DG HIP (WITH OR WITHOUT PELVIS) 2-3V RIGHT COMPARISON:  None. FINDINGS: The cortical margins of the bony pelvis and right hip are intact. No fracture. Pubic symphysis and  sacroiliac joints are congruent. Right hip joint space narrowing with mild acetabular spurring. No evidence of a vascular necrosis. No visualized focal bone lesion. Surgical hardware in the lower lumbar spine is partially included. Bones are under mineralized. IMPRESSION: Mild osteoarthritis of the right hip. No acute fracture. Electronically Signed   By: Keith Rake M.D.   On: 07/16/2019 17:40      Scheduled Meds: . aspirin EC  81 mg Oral Daily  . atorvastatin  40 mg Oral q morning - 10a  . calcium-vitamin D  2 tablet Oral Q breakfast  . cholecalciferol  4,000 Units Oral Daily  . enoxaparin (LOVENOX) injection  40 mg Subcutaneous Q24H  . fluticasone furoate-vilanterol  1 puff Inhalation Daily   And  . umeclidinium bromide  1 puff Inhalation Daily  . folic acid  1 mg Oral Daily  . gabapentin  200 mg Oral BID WC  . [START ON 07/18/2019] gabapentin  300 mg Oral QHS  . ipratropium-albuterol  3 mL Nebulization BID  . loratadine  10 mg Oral Daily  . pantoprazole  40 mg Oral Daily  . sertraline  100 mg Oral Daily  . cyanocobalamin  1,000 mcg Oral Daily   Continuous Infusions: . sodium chloride 75 mL/hr at 07/17/19 0926     LOS: 1 day      Time spent: 35 minutes   Dessa Phi, DO [facility] 07/17/2019, 10:36 AM   Available via Epic secure chat 7am-7pm After these hours, please refer to coverage provider listed on [email or web address]

## 2019-07-17 NOTE — Evaluation (Addendum)
Physical Therapy Evaluation Patient Details Name: [name] MRN: [id number] DOB: 06-22-1935 Today's Date: 07/17/2019   History of Present Illness  84 y.o. female with medical history significant for COPD on chronic 2L, rheumatoid arthritis, hyperlipidemia, GERD and recent COVID infection 05/16/19 who presents with concerns of persistent diarrhea, nausea and vomiting.  Clinical Impression  Pt admitted with above diagnosis. Min A for stand pivot transfer from bed to 3 in 1, then to recliner. Activity tolerance limited by fatigue. ST-SNF recommended if mobility remains significantly limited and/or family unable to provide needed assist at time of DC. Pt lives with her son in law, who is in poor health, and 35 year old granddaughter.   Pt currently with functional limitations due to the deficits listed below (see PT Problem List). Pt will benefit from skilled PT to increase their independence and safety with mobility to allow discharge to the venue listed below.       Follow Up Recommendations SNF;Supervision for mobility/OOB    Equipment Recommendations  None recommended by PT    Recommendations for Other Services       Precautions / Restrictions Precautions Precautions: Fall Precaution Comments: pt reports several falls PTA; 2L O2 at baseline Restrictions Weight Bearing Restrictions: No      Mobility  Bed Mobility Overal bed mobility: Modified Independent             General bed mobility comments: HOB up, used rail  Transfers Overall transfer level: Needs assistance Equipment used: None Transfers: Sit to/from [facility] Sit to Stand: Min assist Stand pivot transfers: Min assist       General transfer comment: Min A to rise and steady, bed to 3 in 1 transfer then SPT to recliner, VCs for hand placement, uncontrolled descent to recliner  Ambulation/Gait Ambulation/Gait assistance: Min assist Gait Distance (Feet): 3 Feet Assistive device: 1  person hand held assist Gait Pattern/deviations: Step-to pattern;Decreased stride length Gait velocity: decr   General Gait Details: several pivotal steps bed to 3 in 1 then to recliner, distance limited by fatigue, pt on 2L O2 throughout session  Stairs            Wheelchair Mobility    Modified Rankin (Stroke Patients Only)       Balance Overall balance assessment: Needs assistance   Sitting balance-Leahy Scale: Good       Standing balance-Leahy Scale: Fair Standing balance comment: single UE support for dynamic standing                             Pertinent Vitals/Pain Pain Assessment: 0-10 Pain Score: 3  Pain Location: R hip Pain Descriptors / Indicators: Sore Pain Intervention(s): Limited activity within patient's tolerance;Monitored during session;Heat applied    Home Living Family/patient expects to be discharged to:: Private residence Living Arrangements: Children;Other relatives Available Help at Discharge: Family;Available 24 hours/day           Home Equipment: Walker - 4 wheels;Cane - single point;Shower seat Additional Comments: has stair lift, on 2L O2 at baseline "for years"    Prior Function Level of Independence: Independent with assistive device(s)         Comments: lives with son in law and 44 year old granddaughter, used rollator or cane PRN but stated using rollator hurt her back bc she had to bend over to reach it and she has h/o back surgery     Hand Dominance  Extremity/Trunk Assessment   Upper Extremity Assessment Upper Extremity Assessment: Overall WFL for tasks assessed    Lower Extremity Assessment Lower Extremity Assessment: Generalized weakness;RLE deficits/detail;LLE deficits/detail RLE Deficits / Details: knee ext AROM -15*, pt stated "there's something wrong with that knee, they draw fluid off of it", pt denies prior knee surgery/injury, knee ext -4/5 RLE Sensation: WNL LLE Deficits / Details:  knee ext AROM -10*, knee ext 4/5 LLE Sensation: WNL    Cervical / Trunk Assessment Cervical / Trunk Assessment: Normal  Communication   Communication: HOH  Cognition Arousal/Alertness: Awake/alert Behavior During Therapy: WFL for tasks assessed/performed Overall Cognitive Status: Within Functional Limits for tasks assessed                                        General Comments      Exercises     Assessment/Plan    PT Assessment Patient needs continued PT services  PT Problem List Decreased strength;Decreased activity tolerance;Decreased balance;Decreased mobility;Pain       PT Treatment Interventions Gait training;DME instruction;Therapeutic activities;Therapeutic exercise;Functional mobility training;Balance training;Patient/family education    PT Goals (Current goals can be found in the Care Plan section)  Acute Rehab PT Goals Patient Stated Goal: to be able to walk PT Goal Formulation: With patient Time For Goal Achievement: 07/31/19 Potential to Achieve Goals: Good    Frequency Min 3X/week   Barriers to discharge        Co-evaluation               AM-PAC PT "6 Clicks" Mobility  Outcome Measure Help needed turning from your back to your side while in a flat bed without using bedrails?: A Little Help needed moving from lying on your back to sitting on the side of a flat bed without using bedrails?: A Little Help needed moving to and from a bed to a chair (including a wheelchair)?: A Little Help needed standing up from a chair using your arms (e.g., wheelchair or bedside chair)?: A Little Help needed to walk in hospital room?: A Lot Help needed climbing 3-5 steps with a railing? : Total 6 Click Score: 15    End of Session Equipment Utilized During Treatment: Oxygen;Gait belt Activity Tolerance: Patient limited by fatigue;Patient tolerated treatment well Patient left: in chair;with call bell/phone within reach;with chair alarm set Nurse  Communication: Mobility status PT Visit Diagnosis: Muscle weakness (generalized) (M62.81);Difficulty in walking, not elsewhere classified (R26.2)    Time: [id number] PT Time Calculation (min) (ACUTE ONLY): 27 min   Charges:   PT Evaluation $PT Eval Low Complexity: 1 Low PT Treatments $Therapeutic Activity: 8-22 mins        Blondell Reveal Kistler PT 07/17/2019  Acute Rehabilitation Services Pager [phone] Office [phone]

## 2019-07-17 NOTE — TOC Initial Note (Signed)
Transition of Care [facility]) - Initial/Assessment Note    Patient Details  Name: [name] MRN: [id number] Date of Birth: Mar 01, 1936  [facility]) CM/SW Contact:    Dessa Phi, RN Phone Number: 07/17/2019, 4:09 PM  Clinical Narrative:  Patient recc for SNF by PT-patient declines SNF/declines HHC-states she lives @ home w/her son who is ill but he takes care of himself. She has home 02-unsure of agency that provides-but has travel tanks.States she will have family to pick her up @ d/c.                 Expected Discharge Plan: Home/Self Care Barriers to Discharge: Continued Medical Work up   Patient Goals and CMS Choice Patient states their goals for this hospitalization and ongoing recovery are:: go home      Expected Discharge Plan and Services Expected Discharge Plan: Home/Self Care   Discharge Planning Services: CM Consult   Living arrangements for the past 2 months: Single Family Home                           HH Arranged: Refused HH          Prior Living Arrangements/Services Living arrangements for the past 2 months: Single Family Home Lives with:: Adult Children Patient language and need for interpreter reviewed:: Yes Do you feel safe going back to the place where you live?: Yes      Need for Family Participation in Patient Care: No (Comment) Care giver support system in place?: Yes (comment) Current home services: DME(rw, 02) Criminal Activity/Legal Involvement Pertinent to Current Situation/Hospitalization: No - Comment as needed  Activities of Daily Living Home Assistive Devices/Equipment: Walker (specify type) ADL Screening (condition at time of admission) Patient's cognitive ability adequate to safely complete daily activities?: Yes Is the patient deaf or have difficulty hearing?: Yes Does the patient have difficulty seeing, even when wearing glasses/contacts?: No Does the patient have difficulty concentrating, remembering, or  making decisions?: No Patient able to express need for assistance with ADLs?: Yes Does the patient have difficulty dressing or bathing?: No Independently performs ADLs?: Yes (appropriate for developmental age) Does the patient have difficulty walking or climbing stairs?: Yes Weakness of Legs: Both Weakness of Arms/Hands: None  Permission Sought/Granted Permission sought to share information with : Case Manager Permission granted to share information with : Yes, Verbal Permission Granted  Share Information with NAME: Case Manager     Contact for sharing info Zella Ball sister [phone]     Emotional Assessment Appearance:: Appears stated age Attitude/Demeanor/Rapport: Gracious Affect (typically observed): Accepting Orientation: : Oriented to Self, Oriented to Place, Oriented to  Time, Oriented to Situation Alcohol / Substance Use: Not Applicable Psych Involvement: No (comment)  Admission diagnosis:  Diarrhea [R19.7] Dehydration [E86.0] Nausea vomiting and diarrhea [R11.2, R19.7] Community acquired pneumonia of left lower lobe of lung [J18.9] Patient Active Problem List   Diagnosis Date Noted  . Diarrhea 07/16/2019  . AKI (acute kidney injury) ([place]) 07/16/2019  . Falls frequently 07/16/2019  . COVID-19 virus infection 05/16/2019  . Community acquired pneumonia   . Acute on chronic respiratory failure with hypoxia ([place]) 06/08/2018  . Acute respiratory failure with hypercapnia ([place])   . COPD exacerbation ([place]) 07/29/2017  . Rheumatoid arthritis ([place]) 07/29/2017  . Neuropathy 07/29/2017  . Acute respiratory failure with hypoxia ([place]) 07/29/2017  . Anemia 05/20/2016  . Hyperglycemia 05/20/2016  . Shoulder impingement syndrome 03/25/2015  .  Chronic respiratory failure ([place]) 04/10/2012  . Constipation 07/03/2011  . COPD with acute exacerbation ([place]) 06/29/2011  . Stage 3 severe COPD by GOLD classification ([place]) 06/19/2007  . HLD (hyperlipidemia) 05/15/2007  . ACID REFLUX  DISEASE 05/15/2007   PCP:  [name], MD Pharmacy:   [facility] [phone] - [place], [place] - [address] AT [place] [address] [place] [address] Phone: [phone] Fax: [place] [phone] - [place], [place] AT [address] [place] [address] Phone: [phone] Fax: [phone]     Social Determinants of Health (SDOH) Interventions    Readmission Risk Interventions No flowsheet data found.

## 2019-07-18 DIAGNOSIS — N179 Acute kidney failure, unspecified: Secondary | ICD-10-CM

## 2019-07-18 LAB — CBC
HCT: 36.9 % (ref 36.0–46.0)
Hemoglobin: 11.2 g/dL — ABNORMAL LOW (ref 12.0–15.0)
MCH: 29.6 pg (ref 26.0–34.0)
MCHC: 30.4 g/dL (ref 30.0–36.0)
MCV: 97.4 fL (ref 80.0–100.0)
Platelets: 316 10*3/uL (ref 150–400)
RBC: 3.79 MIL/uL — ABNORMAL LOW (ref 3.87–5.11)
RDW: 14.1 % (ref 11.5–15.5)
WBC: 10.2 10*3/uL (ref 4.0–10.5)
nRBC: 0 % (ref 0.0–0.2)

## 2019-07-18 LAB — BASIC METABOLIC PANEL
Anion gap: 12 (ref 5–15)
BUN: 10 mg/dL (ref 8–23)
CO2: 24 mmol/L (ref 22–32)
Calcium: 8.7 mg/dL — ABNORMAL LOW (ref 8.9–10.3)
Chloride: 104 mmol/L (ref 98–111)
Creatinine, Ser: 0.59 mg/dL (ref 0.44–1.00)
GFR calc Af Amer: >60 mL/min (ref >60)
GFR calc non Af Amer: >60 mL/min (ref >60)
Glucose, Bld: 113 mg/dL — ABNORMAL HIGH (ref 70–99)
Potassium: 3.5 mmol/L (ref 3.5–5.1)
Sodium: 140 mmol/L (ref 135–145)

## 2019-07-18 NOTE — Discharge Summary (Signed)
Physician Discharge Summary  [name] [id number] DOB: May 18, 1935 DOA: 07/16/2019  PCP: [name], MD  Admit date: 07/16/2019 Discharge date: 07/18/2019  Admitted From: Home Disposition:  Home, declined SNF and home health services   Recommendations for Outpatient Follow-up:  1. Follow up with PCP in 1 week  Discharge Condition: Stable CODE STATUS: Full  Diet recommendation: Heart healthy   Brief/Interim Summary: [name] a 84 y.o.femalewith medical history significant forCOPD on chronic 2L,rheumatoid arthritis, hyperlipidemia, GERD and recentCOVID infection 05/16/19 who presents with concerns of persistent diarrhea, nausea and vomiting. She started to have diarrhea and nausea and vomiting for the past 2 days. Has diarrhea and vomiting up to 5 times daily. Also complains of weakness and falls at home. She was admitted with AKI and suspected gastroenteritis. Patient was given IVF with improvement in AKI. Her symptoms of N/V/D resolved and was tolerating diet.   Discharge Diagnoses:  Principal Problem:   Diarrhea Active Problems:   HLD (hyperlipidemia)   Stage 3 severe COPD by GOLD classification (HCC)   Chronic respiratory failure (HCC)   Rheumatoid arthritis (HCC)   AKI (acute kidney injury) ([place])   Falls frequently   Nausea, vomiting, diarrhea suspect secondary to viral gastroenteritis -Has not had a bowel movement since admission, denies any watery stools just loose.  Discontinue C. difficile test  -Leukocytosis resolved  -Resolved N/V/D and tolerating diet   AKI -Resolved, Cr 0.59   Fall at home -Right hip x-ray showed mild osteoarthritis without acute fracture -Right hip CT showed no acute osseous abnormality -PT evaluation recommended SNF but patient declined   Chronic hypoxemic respiratory failure in setting of COPD -Requires nasal cannula O2 at baseline -No respiratory symptoms to indicate antibiotic needs at this point.  Stop  Levaquin -Continue home inhalers -Continue to monitor  Rheumatoid arthritis -Hold Plaquenil in setting of acute illness  GERD -Continue PPI  Hyperlipidemia -Continue statin  Depression -Continue Zoloft  Recent Covid infection -Tested positive 05/16/2019.  Patient within 37-month window, does not need to be retested or requires isolation/precaution    Discharge Instructions  Discharge Instructions    Call MD for:  difficulty breathing, headache or visual disturbances   Complete by: As directed    Call MD for:  extreme fatigue   Complete by: As directed    Call MD for:  persistant dizziness or light-headedness   Complete by: As directed    Call MD for:  persistant nausea and vomiting   Complete by: As directed    Call MD for:  severe uncontrolled pain   Complete by: As directed    Call MD for:  temperature >100.4   Complete by: As directed    Diet - low sodium heart healthy   Complete by: As directed    Discharge instructions   Complete by: As directed    You were cared for by a hospitalist during your hospital stay. If you have any questions about your discharge medications or the care you received while you were in the hospital after you are discharged, you can call the unit and ask to speak with the hospitalist on call if the hospitalist that took care of you is not available. Once you are discharged, your primary care physician will handle any further medical issues. Please note that NO REFILLS for any discharge medications will be authorized once you are discharged, as it is imperative that you return to your primary care physician (or establish a relationship with a primary care  physician if you do not have one) for your aftercare needs so that they can reassess your need for medications and monitor your lab values.   Increase activity slowly   Complete by: As directed      Allergies as of 07/18/2019      Reactions   Kapidex [dexlansoprazole] Other (See Comments)    Stomach pain, diarrhea   Zegerid [omeprazole] Other (See Comments)   Burning in chest      Medication List    TAKE these medications   acetaminophen 650 MG CR tablet Commonly known as: TYLENOL Take 1,300 mg by mouth every 8 (eight) hours as needed for pain.   acetaminophen 325 MG tablet Commonly known as: TYLENOL Take 2 tablets (650 mg total) by mouth every 6 (six) hours as needed for mild pain (or Fever >/= 101).   albuterol (2.5 MG/3ML) 0.083% nebulizer solution Commonly known as: PROVENTIL Take 3 mLs (2.5 mg total) by nebulization every 4 (four) hours as needed for wheezing or shortness of breath. For shortness of breath.   aspirin EC 81 MG tablet Take 81 mg by mouth daily.   atorvastatin 40 MG tablet Commonly known as: LIPITOR Take 40 mg by mouth every morning.   azelastine 0.1 % nasal spray Commonly known as: ASTELIN Place 1 spray into the nose 2 (two) times daily as needed for allergies.   budesonide-formoterol 160-4.5 MCG/ACT inhaler Commonly known as: SYMBICORT Inhale 2 puffs into the lungs 2 (two) times daily.   calcium-vitamin D 500-200 MG-UNIT tablet Commonly known as: OSCAL WITH D Take 2 tablets by mouth daily with breakfast.   carboxymethylcellulose 0.5 % Soln Commonly known as: REFRESH PLUS 2 drops 2 (two) times daily as needed (dry eye).   cetirizine 10 MG tablet Commonly known as: ZYRTEC Take 1 tablet (10 mg total) by mouth every morning.   cyanocobalamin 1000 MCG tablet Take 1 tablet (1,000 mcg total) by mouth daily.   esomeprazole 40 MG capsule Commonly known as: NEXIUM Take 1 capsule (40 mg total) by mouth every morning.   folic acid 1 MG tablet Commonly known as: FOLVITE Take 1 tablet (1 mg total) by mouth daily.   gabapentin 100 MG capsule Commonly known as: NEURONTIN Take 2-3 capsules (200-300 mg total) by mouth 3 (three) times daily. 200 mg every AM and at noon and 300 mg QHS   hydroxychloroquine 200 MG tablet Commonly known as:  PLAQUENIL Take 200 mg by mouth daily.   ipratropium-albuterol 0.5-2.5 (3) MG/3ML Soln Commonly known as: DUONEB Take 3 mLs by nebulization 2 (two) times daily.   meloxicam 15 MG tablet Commonly known as: MOBIC Take 15 mg by mouth daily.   mupirocin ointment 2 % Commonly known as: BACTROBAN Place 1 application into the nose 2 (two) times daily.   polyethylene glycol 17 g packet Commonly known as: MIRALAX / GLYCOLAX Take 17 g by mouth daily. What changed:   when to take this  reasons to take this   polyvinyl alcohol 1.4 % ophthalmic solution Commonly known as: LIQUIFILM TEARS Place 2 drops into both eyes as needed for dry eyes.   sertraline 100 MG tablet Commonly known as: ZOLOFT Take 100 mg by mouth daily.   tiotropium 18 MCG inhalation capsule Commonly known as: SPIRIVA Place 1 capsule (18 mcg total) into inhaler and inhale every morning.   Trelegy Ellipta 100-62.5-25 MCG/INH Aepb Generic drug: Fluticasone-Umeclidin-Vilant Take 1 puff by mouth daily.   Vitamin D 50 MCG (2000 UT) tablet Take 4,000  Units by mouth daily.      Follow-up Information    [name], MD. Schedule an appointment as soon as possible for a visit in 1 week(s).   Specialty: Family Medicine Contact information: [place] [address] [phone]          Allergies  Allergen Reactions  . Kapidex [Dexlansoprazole] Other (See Comments)    Stomach pain, diarrhea  . Zegerid [Omeprazole] Other (See Comments)    Burning in chest    Consultations:  None    Procedures/Studies: CT Hip Right Wo Contrast  Result Date: 07/16/2019 CLINICAL DATA:  Limping, acute hip pain after fall EXAM: CT OF THE RIGHT HIP WITHOUT CONTRAST TECHNIQUE: Multidetector CT imaging of the right hip was performed according to the standard protocol. Multiplanar CT image reconstructions were also generated. COMPARISON:  Radiograph same day FINDINGS: Bones/Joint/Cartilage No fracture or  dislocation. Diffuse osteopenia seen. Moderate femoroacetabular joint osteoarthritis is seen with superior joint space loss and marginal osteophyte formation. Enthesophytes seen at the greater trochanter. No large hip joint effusion is seen. Enthesophytes are also seen at the posterior ischial tuberosity. Ligaments Suboptimally assessed by CT. Muscles and Tendons The muscles surrounding the hip appear to be intact without focal atrophy or tear. The tendons are grossly intact. Soft tissues Mild soft tissue swelling seen over the lateral aspect of the hip. No soft tissue hematoma. There is scattered colonic diverticula. The visualized portion of the bladder is unremarkable. Scattered vascular calcifications are noted. IMPRESSION: No acute osseous abnormality. Moderate femoroacetabular joint osteoarthritis. Electronically Signed   By: Prudencio Pair M.D.   On: 07/16/2019 22:06   DG Chest Port 1 View  Result Date: 07/16/2019 CLINICAL DATA:  hip pain, possible fall EXAM: PORTABLE CHEST 1 VIEW COMPARISON:  Radiograph 05/15/2019 FINDINGS: Normal heart size and mediastinal contours. Aortic atherosclerosis. Patchy airspace disease in the periphery of the left lung base, new from prior. No other focal consolidation. No pulmonary edema. No pneumothorax. No visualized pleural effusion. No acute osseous abnormalities are seen. IMPRESSION: Patchy airspace disease in the periphery of the left lung base, suspicious for pneumonia. Recommend continued radiographic follow-up after course of treatment to ensure resolution. Electronically Signed   By: Keith Rake M.D.   On: 07/16/2019 17:41   DG Hip Unilat With Pelvis 2-3 Views Right  Result Date: 07/16/2019 CLINICAL DATA:  Right hip pain for 3 days. Possible fall. EXAM: DG HIP (WITH OR WITHOUT PELVIS) 2-3V RIGHT COMPARISON:  None. FINDINGS: The cortical margins of the bony pelvis and right hip are intact. No fracture. Pubic symphysis and sacroiliac joints are congruent.  Right hip joint space narrowing with mild acetabular spurring. No evidence of a vascular necrosis. No visualized focal bone lesion. Surgical hardware in the lower lumbar spine is partially included. Bones are under mineralized. IMPRESSION: Mild osteoarthritis of the right hip. No acute fracture. Electronically Signed   By: Keith Rake M.D.   On: 07/16/2019 17:40       Discharge Exam: Vitals:   07/18/19 0515 07/18/19 0726  BP: (!) 145/62   Pulse: 76   Resp: 18   Temp: 98.9 F (37.2 C)   SpO2: 96% 95%    General: Pt is alert, awake, not in acute distress Cardiovascular: RRR, S1/S2 +, no edema Respiratory: CTA bilaterally, no wheezing, no rhonchi, no respiratory distress, no conversational dyspnea  Abdominal: Soft, NT, ND, bowel sounds + Extremities: no edema, no cyanosis Psych: Normal mood and affect, stable judgement and insight  The results of significant diagnostics from this hospitalization (including imaging, microbiology, ancillary and laboratory) are listed below for reference.     Microbiology: No results found for this or any previous visit (from the past 240 hour(s)).   Labs: BNP (last 3 results) No results for input(s): BNP in the last 8760 hours. Basic Metabolic Panel: Recent Labs  Lab 07/16/19 1728 07/17/19 0456 07/18/19 0508  NA 134* 136 140  K 3.5 3.7 3.5  CL 95* 100 104  CO2 28 27 24   GLUCOSE 107* 101* 113*  BUN 32* 20 10  CREATININE 1.21* 0.76 0.59  CALCIUM 8.6* 8.3* 8.7*   Liver Function Tests: Recent Labs  Lab 07/16/19 1728 07/17/19 0456  AST 14* 12*  ALT 13 11  ALKPHOS 59 47  BILITOT 0.7 0.9  PROT 7.4 6.0*  ALBUMIN 3.5 2.7*   Recent Labs  Lab 07/16/19 1728  LIPASE 27   No results for input(s): AMMONIA in the last 168 hours. CBC: Recent Labs  Lab 07/16/19 1728 07/17/19 0456 07/18/19 0508  WBC 14.6* 11.0* 10.2  NEUTROABS 12.0*  --   --   HGB 11.9* 10.5* 11.2*  HCT 37.8 33.0* 36.9  MCV 93.6 94.8 97.4  PLT 360 312 316    Cardiac Enzymes: No results for input(s): CKTOTAL, CKMB, CKMBINDEX, TROPONINI in the last 168 hours. BNP: Invalid input(s): POCBNP CBG: No results for input(s): GLUCAP in the last 168 hours. D-Dimer No results for input(s): DDIMER in the last 72 hours. Hgb A1c No results for input(s): HGBA1C in the last 72 hours. Lipid Profile No results for input(s): CHOL, HDL, LDLCALC, TRIG, CHOLHDL, LDLDIRECT in the last 72 hours. Thyroid function studies No results for input(s): TSH, T4TOTAL, T3FREE, THYROIDAB in the last 72 hours.  Invalid input(s): FREET3 Anemia work up No results for input(s): VITAMINB12, FOLATE, FERRITIN, TIBC, IRON, RETICCTPCT in the last 72 hours. Urinalysis    Component Value Date/Time   COLORURINE YELLOW 07/16/2019 1701   APPEARANCEUR HAZY (A) 07/16/2019 1701   LABSPEC 1.013 07/16/2019 1701   PHURINE 5.0 07/16/2019 1701   GLUCOSEU NEGATIVE 07/16/2019 1701   HGBUR NEGATIVE 07/16/2019 1701   BILIRUBINUR NEGATIVE 07/16/2019 1701   BILIRUBINUR neg 08/17/2013 1446   KETONESUR NEGATIVE 07/16/2019 1701   PROTEINUR NEGATIVE 07/16/2019 1701   UROBILINOGEN 0.2 08/17/2013 1446   UROBILINOGEN 0.2 07/06/2011 1519   NITRITE NEGATIVE 07/16/2019 1701   LEUKOCYTESUR LARGE (A) 07/16/2019 1701   Sepsis Labs Invalid input(s): PROCALCITONIN,  WBC,  LACTICIDVEN Microbiology No results found for this or any previous visit (from the past 240 hour(s)).   Patient was seen and examined on the day of discharge and was found to be in stable condition. Time coordinating discharge: 25 minutes including assessment and coordination of care, as well as examination of the patient.   SIGNED:  Dessa Phi, DO [facility] 07/18/2019, 9:40 AM

## 2019-07-18 NOTE — Progress Notes (Addendum)
Assessment unchanged. Pt verbalized understanding through teach back of dc instructions including medications, follow up care, and when to call the doctor. Discharged via wc to front entrance accompanied by NT.

## 2019-07-23 DIAGNOSIS — R296 Repeated falls: Secondary | ICD-10-CM | POA: Diagnosis not present

## 2019-07-23 DIAGNOSIS — R918 Other nonspecific abnormal finding of lung field: Secondary | ICD-10-CM | POA: Diagnosis not present

## 2019-07-23 DIAGNOSIS — G8929 Other chronic pain: Secondary | ICD-10-CM | POA: Diagnosis not present

## 2019-07-23 DIAGNOSIS — Z0000 Encounter for general adult medical examination without abnormal findings: Secondary | ICD-10-CM | POA: Diagnosis not present

## 2019-07-23 DIAGNOSIS — J449 Chronic obstructive pulmonary disease, unspecified: Secondary | ICD-10-CM | POA: Diagnosis not present

## 2019-07-23 DIAGNOSIS — M25561 Pain in right knee: Secondary | ICD-10-CM | POA: Diagnosis not present

## 2019-08-13 ENCOUNTER — Other Ambulatory Visit: Payer: Self-pay

## 2019-08-13 ENCOUNTER — Ambulatory Visit (INDEPENDENT_AMBULATORY_CARE_PROVIDER_SITE_OTHER): Payer: Medicare Other | Admitting: Rehabilitative and Restorative Service Providers"

## 2019-08-13 ENCOUNTER — Encounter: Payer: Self-pay | Admitting: Rehabilitative and Restorative Service Providers"

## 2019-08-13 VITALS — HR 93

## 2019-08-13 DIAGNOSIS — R2681 Unsteadiness on feet: Secondary | ICD-10-CM

## 2019-08-13 DIAGNOSIS — R2689 Other abnormalities of gait and mobility: Secondary | ICD-10-CM

## 2019-08-13 DIAGNOSIS — Z9181 History of falling: Secondary | ICD-10-CM | POA: Diagnosis not present

## 2019-08-13 DIAGNOSIS — M6281 Muscle weakness (generalized): Secondary | ICD-10-CM | POA: Diagnosis not present

## 2019-08-13 NOTE — Patient Instructions (Signed)
Access Code: [email or web address] URL: [email or web address] Date: 08/13/2019 Prepared by: Rudell Cobb  Exercises Seated Ankle Pumps - 2 x daily - 7 x weekly - 1 sets - 10 reps Seated Long Arc Quad - 2 x daily - 7 x weekly - 10 reps - 1 sets

## 2019-08-13 NOTE — Therapy (Signed)
[facility] [place], [place], [address] Phone: [phone]   Fax:  [phone]  Physical Therapy Evaluation  Patient Details  Name: [name] MRN: [id number] Date of Birth: 11/24/1935 Referring Provider (PT): [name], MD   Encounter Date: 08/13/2019  PT End of Session - 08/13/19 1206    Visit Number  1    Number of Visits  16    Date for PT Re-Evaluation  10/12/19    PT Start Time  1100    PT Stop Time  1145    PT Time Calculation (min)  45 min    Equipment Utilized During Treatment  Gait belt    Activity Tolerance  Patient tolerated treatment well    Behavior During Therapy  [facility] for tasks assessed/performed       Past Medical History:  Diagnosis Date  . ACID REFLUX DISEASE 05/15/2007   Qualifier: Diagnosis of  By: [name] MD, Christena Deem   . Acute and chronic respiratory failure with hypoxia ([place])   . Acute on chronic respiratory failure with hypoxia ([place]) 06/08/2018  . Acute respiratory failure with hypercapnia ([place])   . Acute respiratory failure with hypoxia ([place]) 07/29/2017  . Anemia 05/20/2016  . Arthritis   . Bladder neoplasm   . Body mass index 34.0-34.9, adult   . Bronchitis 07/06/2011  . Chest pain 07/06/2011  . Chronic cough   . Chronic respiratory failure ([place])   . Community acquired pneumonia   . Constipation 07/03/2011  . COPD (chronic obstructive pulmonary disease) (HCC)    PULMOLOGIST-  DR [name]--  GOLD III W/  CHRONIC RESPIRATORY FAILURE--  O2 DEPENDENT  . COPD with acute exacerbation ([place]) 06/29/2011  . Coronary arteriosclerosis   . Cough productive of purulent sputum   . Depression   . Dyslipidemia   . Falls frequently   . Fibromyalgia   . GERD (gastroesophageal reflux disease)   . Hearing loss in right ear   . Hematuria   . History of nephritis    as child dx w/ Bright's disease (glomerulonephritis)  . HLD (hyperlipidemia) 05/15/2007   Qualifier: Diagnosis of  By: [name] MD,  Christena Deem   . Hyperglycemia 05/20/2016  . Hyperlipidemia   . Hypomagnesemia   . Hypoxemia   . Iron deficiency anemia, unspecified   . Leukopenia   . Low vitamin D level   . Mild obstructive sleep apnea    no cpap recommendation  . Neuropathy 07/29/2017  . Neuropathy, peripheral, idiopathic   . Osteopenia   . Other allergic rhinitis   . Pelvic pain in female   . Polyarthralgia   . PONV (postoperative nausea and vomiting)   . Pre-diabetes   . Rheumatoid arthritis ([place]) 07/29/2017  . Shoulder impingement syndrome 03/25/2015  . Sleep disorder, unspecified   . Spinal stenosis   . Stage 3 severe COPD by GOLD classification ([place]) 06/19/2007   Followed in Pulmonary clinic/ [facility]/ [place]  - hfa 90% 05/15/2012  - PFTs 05/15/2012 FEV1  0.61 ( 36%) ratio 57 and 21% better p B2 and DLCO 44%  106 corrected  - Referred to [facility] 05/15/12 - Rec pulmonary f/u prn 02/11/13    . Supplemental oxygen dependent    2L  via Nasal Canula --  activity and nighttime  . Urinary incontinence in female     Past Surgical History:  Procedure Laterality Date  . APPENDECTOMY  1971  . CARDIAC CATHETERIZATION  05-08-2007  dr Marlou Porch  minor coronary plaquing w/ no significant CAD/  20% mLAD,  10% mRCA,  perserved LV, ef 60-65%  . CATARACT EXTRACTION W/ INTRAOCULAR LENS  IMPLANT, BILATERAL  2001 approx  . CHOLECYSTECTOMY  1988  . CYSTOSCOPY W/ RETROGRADES Bilateral 09/24/2014   Procedure: CYSTOSCOPY WITH RETROGRADE PYELOGRAM;  Surgeon: Festus Aloe, MD;  Location[facility];  Service: Urology;  Laterality: Bilateral;  . CYSTOSCOPY WITH BIOPSY N/A 09/24/2014   Procedure: CYSTOSCOPY WITH BIOPSY;  Surgeon: Festus Aloe, MD;  Location: [facility];  Service: Urology;  Laterality: N/A;  . ESOPHAGOGASTRODUODENOSCOPY  last one 09-09-2008  . FULGURATION OF BLADDER TUMOR N/A 09/24/2014   Procedure: FULGURATION OF BLADDER TUMOR;  Surgeon: Festus Aloe, MD;  Location: [facility];  Service: Urology;  Laterality: N/A;  . [place]  . POSTERIOR LUMBAR FUSION  10-07-2007   bilateral laminectomy L4 and bilateral L4-5 diskectomy w/ fusion  . TONSILLECTOMY  as child  . TOTAL ABDOMINAL HYSTERECTOMY W/ BILATERAL SALPINGOOPHORECTOMY  1973  . TRANSTHORACIC ECHOCARDIOGRAM  07-07-2011   normal echo,  ef 65-70%    Vitals:   08/13/19 1136  Pulse: 93  SpO2: (!) 79%     Subjective Assessment - 08/13/19 1107    Subjective  The patient reports worsening balance with falls that began several months ago.  She reports "I'll be walking and all of a sudden it's like I fall."  She reports she can get a fuzzy headed sensation before she falls at times.  When she falls, she has difficulty getting up from the floor.  She estimates falling several times in the past month (unable to recall over 6 month time frame).    Pertinent History  COPD, RA, GERD, recent hospitalizations (covid in 1/21).    Patient Stated Goals  Reduce falls         [facility] PT Assessment - 08/13/19 1110      Assessment   Medical Diagnosis  Frequent falls, R hip/knee pain, walker consultation    Referring Provider (PT)  [name], MD    Onset Date/Surgical Date  08/05/19    Hand Dominance  Right      Precautions   Precautions  Fall      Restrictions   Weight Bearing Restrictions  No      Balance Screen   Has the patient fallen in the past 6 months  Yes    How many times?  unable to recall #; several in past month    Has the patient had a decrease in activity level because of a fear of falling?   Yes    Is the patient reluctant to leave their home because of a fear of falling?   Yes      [place]  Private residence    Living Arrangements  Other relatives   grand daughter and son in law (daughter passed away)   Type of [place] Access  Level entry    Home Layout  Two level    Alternate Level Stairs-Number of Steps  uses chair  [place] - 4 wheels;Sh[employer] writer with a seat (hurts back to use);  chair lift steps     Prior Function   Level of Independence  Independent      Sensation   Light Touch  Appears Intact      Posture/Postural Control  Posture/Postural Control  Postural limitations    Postural Limitations  Rounded Shoulders;Forward head      ROM / Strength   AROM / PROM / Strength  AROM;Strength      AROM   Overall AROM   Deficits    Overall AROM Comments  Limited ROM in shoulders to 140 degrees bilaterally.        Strength   Overall Strength  Deficits    Overall Strength Comments  Bilat hip flexion 3/5, R knee extension is 2+/5, L knee extension is 3+/5, R knee flexion 3/5, L knee flexion 4/5, bilat ankle DF is 3/5.  Bilateral shoulder flexion/abduction is 4-/5.      Palpation   Palpation comment  R knee pain anterior with gait; palpation reveals tenderness medial R patella and knee joint line.      Transfers   Transfers  Sit to Stand    Sit to Stand  6: Modified independent (Device/Increase time);With upper extremity assist    Five time sit to stand comments   41.09 seconds      Ambulation/Gait   Ambulation/Gait  Yes    Ambulation/Gait Assistance  5: Supervision    Ambulation/Gait Assistance Details  R antalgic gait pattern    Ambulation Distance (Feet)  75 Feet    Assistive device  None    Gait Pattern  Antalgic;Step-through pattern;Decreased stride length;Decreased weight shift to right    Gait velocity  1.13 ft/sec      Standardized Balance Assessment   Standardized Balance Assessment  Berg Balance Test      Berg Balance Test   Sit to Stand  Able to stand  independently using hands    Standing Unsupported  Able to stand 30 seconds unsupported    Sitting with Back Unsupported but Feet Supported on Floor or Stool  Able to sit safely and securely 2 minutes    Stand to Sit  Controls descent by using hands    Transfers  Able to transfer safely, definite  need of hands    Standing Unsupported with Eyes Closed  Needs help to keep from falling    Standing Unsupported with Feet Together  Needs help to attain position and unable to hold for 15 seconds    From Standing, Reach Forward with Outstretched Arm  Loses balance while trying/requires external support    From Standing Position, Pick up Object from Floor  Unable to try/needs assist to keep balance    From Standing Position, Turn to Look Behind Over each Shoulder  Needs assist to keep from losing balance and falling    Turn 360 Degrees  Needs assistance while turning    Standing Unsupported, Alternately Place Feet on Step/Stool  Needs assistance to keep from falling or unable to try    Standing Unsupported, One Foot in [employer] balance while stepping or standing    Standing on One Leg  Unable to try or needs assist to prevent fall    Total Score  15    Berg comment:  15/56                Objective measurements completed on examination: See above findings.              PT Education - 08/13/19 1200    Education Details  HEP seated (unable to tolerate standing due to low oxygen); discussed assistive device recomendations, discussed use of oxygen when in therapy    Person(s) Educated  Patient  Methods  Explanation;Demonstration;Handout    Comprehension  Returned demonstration;Verbalized understanding       PT Short Term Goals - 08/13/19 1207      PT SHORT TERM GOAL #1   Title  The patient will be indep with initial HEP.    Time  4    Period  Weeks    Target Date  09/12/19      PT SHORT TERM GOAL #2   Title  The patient will ambulate with RW mod indep x 150 feet.    Time  6    Period  Weeks    Target Date  09/12/19      PT SHORT TERM GOAL #3   Title  The patient will improve 5 time sit to stand to < or equal to 35 seconds.    Time  6    Period  Weeks    Target Date  09/12/19      PT SHORT TERM GOAL #4   Title  The patient will improve Berg score from  15/56 to > or equal to 25/56 to demonstrate dec'ing risk for falls.    Time  6    Period  Weeks    Target Date  09/12/19        PT Long Term Goals - 08/13/19 1207      PT LONG TERM GOAL #1   Title  The patient will be indep with HEP for LE strengthening, balance, and flexibility.    Time  8    Period  Weeks    Target Date  10/12/19      PT LONG TERM GOAL #2   Title  The patient will reduce 5 time sit<>stand to < or equal to 30 seconds to demonstrate improving LE strength.    Time  8    Period  Weeks    Target Date  10/12/19      PT LONG TERM GOAL #3   Title  The patient will imrpove Berg from 15/56 to > or equal to 30/56 to demonstrate improved steady state standing without UE Support.    Time  8    Period  Weeks    Target Date  10/12/19      PT LONG TERM GOAL #4   Title  The patient will tolerate walking x 3 minutes with use of RW and oxygen to demonstrate improving mobility for short community distances.    Time  8    Period  Weeks    Target Date  10/12/19      PT LONG TERM GOAL #5   Title  The patient will improve gait speed to > or equal to 1.8 ft/sec to demonstrate dec'd risk for fall.    Time  8    Period  Weeks    Target Date  10/12/19             Plan - 08/13/19 1212    Clinical Impression Statement  The patient is an 84 year old female presenting with multi-factorial fall risk.  She is not using her supplemental O2 today reporting she leaves it at home due to it being heavy and when checked her O2 level was between 70-83%.  PT limited standing exercises due to dec'd tolerance to activity without oxygent and educated patient that we will not be able to treat her without her oxygen due to low spO2.  She has impairments in R knee pain, bilat LE strength, bilat UE strength, poor posture, imbalance,  gait dysfunction, frequent falls, and dec'd safety awareness. Her functional limitations include diminished safety with household/community ambulation, frequent falls,  dec'd participation with ADLs and IADLs.  PT to address deficits to promote improved safety with functional tasks.    Personal Factors and Comorbidities  Comorbidity 1;Comorbidity 3+;Comorbidity 2;Other    Comorbidities  frequent falls, COPD, RA, recent covid infection (1/21), dec'd safety awareness    Examination-Activity Limitations  Locomotion Level;Squat;Stairs;Stand    Examination-Participation Restrictions  Community Activity;Shop    Stability/Clinical Decision Making  Unstable/Unpredictable    Clinical Decision Making  High    Rehab Potential  Good    PT Frequency  2x / week    PT Duration  8 weeks    PT Treatment/Interventions  ADLs/Self Care Home Management;Gait training;Stair training;Functional mobility training;Therapeutic activities;Therapeutic exercise;Balance training;Neuromuscular re-education;Patient/family education;Vestibular;Manual techniques;DME Instruction;Moist Heat;Passive range of motion    PT Next Visit Plan  check spO2, monitor during session (patient should bring home O2), [place] balance HEP near countertop to initiate strengthening, safety with ambulation (encourage walker use-- may need to work on low back positioning/stretching to tolerate walker), standing balance.    PT Home Exercise Plan  Access Code: [email or web address]    Consulted and Agree with Plan of Care  Patient       Patient will benefit from skilled therapeutic intervention in order to improve the following deficits and impairments:  Abnormal gait, Difficulty walking, Decreased strength, Postural dysfunction, Impaired flexibility, Hypomobility, Pain, Decreased activity tolerance, Decreased balance  Visit Diagnosis: Other abnormalities of gait and mobility  Unsteadiness on feet  Muscle weakness (generalized)  History of falling     Problem List Patient Active Problem List   Diagnosis Date Noted  . Diarrhea 07/16/2019  . AKI (acute kidney injury) ([place]) 07/16/2019  . Falls frequently 07/16/2019  .  COVID-19 virus infection 05/16/2019  . Community acquired pneumonia   . Acute on chronic respiratory failure with hypoxia ([place]) 06/08/2018  . Acute respiratory failure with hypercapnia ([place])   . COPD exacerbation ([place]) 07/29/2017  . Rheumatoid arthritis ([place]) 07/29/2017  . Neuropathy 07/29/2017  . Acute respiratory failure with hypoxia ([place]) 07/29/2017  . Anemia 05/20/2016  . Hyperglycemia 05/20/2016  . Shoulder impingement syndrome 03/25/2015  . Chronic respiratory failure ([place]) 04/10/2012  . Constipation 07/03/2011  . COPD with acute exacerbation ([place]) 06/29/2011  . Stage 3 severe COPD by GOLD classification ([place]) 06/19/2007  . HLD (hyperlipidemia) 05/15/2007  . ACID REFLUX DISEASE 05/15/2007    Maygen Sirico, PT 08/13/2019, 12:20 PM  [facility] [address] [place], [place], [address] Phone: [phone]   Fax:  [phone]  Name: [name] MRN: [id number] Date of Birth: Dec 30, 1935

## 2019-08-17 ENCOUNTER — Ambulatory Visit (INDEPENDENT_AMBULATORY_CARE_PROVIDER_SITE_OTHER): Payer: Medicare Other | Admitting: Rehabilitative and Restorative Service Providers"

## 2019-08-17 ENCOUNTER — Other Ambulatory Visit: Payer: Self-pay

## 2019-08-17 DIAGNOSIS — M6281 Muscle weakness (generalized): Secondary | ICD-10-CM

## 2019-08-17 DIAGNOSIS — R2689 Other abnormalities of gait and mobility: Secondary | ICD-10-CM

## 2019-08-17 DIAGNOSIS — Z9181 History of falling: Secondary | ICD-10-CM

## 2019-08-17 DIAGNOSIS — R42 Dizziness and giddiness: Secondary | ICD-10-CM

## 2019-08-17 DIAGNOSIS — R2681 Unsteadiness on feet: Secondary | ICD-10-CM

## 2019-08-17 NOTE — Therapy (Signed)
[place], [place], [address] Phone: [phone]   Fax:  [phone]  Physical Therapy Treatment  Patient Details  Name: [name] MRN: [id number] Date of Birth: 06-08-1935 Referring Provider (PT): [name], MD   Encounter Date: 08/17/2019  PT End of Session - 08/17/19 1154    Visit Number  2    Number of Visits  16    Date for PT Re-Evaluation  10/12/19    PT Start Time  1102    PT Stop Time  1147    PT Time Calculation (min)  45 min    Equipment Utilized During Treatment  Gait belt    Activity Tolerance  Patient tolerated treatment well    Behavior During Therapy  [facility] for tasks assessed/performed       Past Medical History:  Diagnosis Date  . ACID REFLUX DISEASE 05/15/2007   Qualifier: Diagnosis of  By: [name] MD, Christena Deem   . Acute and chronic respiratory failure with hypoxia ([place])   . Acute on chronic respiratory failure with hypoxia ([place]) 06/08/2018  . Acute respiratory failure with hypercapnia ([place])   . Acute respiratory failure with hypoxia ([place]) 07/29/2017  . Anemia 05/20/2016  . Arthritis   . Bladder neoplasm   . Body mass index 34.0-34.9, adult   . Bronchitis 07/06/2011  . Chest pain 07/06/2011  . Chronic cough   . Chronic respiratory failure ([place])   . Community acquired pneumonia   . Constipation 07/03/2011  . COPD (chronic obstructive pulmonary disease) (HCC)    PULMOLOGIST-  DR [name]--  GOLD III W/  CHRONIC RESPIRATORY FAILURE--  O2 DEPENDENT  . COPD with acute exacerbation ([place]) 06/29/2011  . Coronary arteriosclerosis   . Cough productive of purulent sputum   . Depression   . Dyslipidemia   . Falls frequently   . Fibromyalgia   . GERD (gastroesophageal reflux disease)   . Hearing loss in right ear   . Hematuria   . History of nephritis    as child dx w/ Bright's disease (glomerulonephritis)  . HLD (hyperlipidemia) 05/15/2007   Qualifier: Diagnosis of  By: [name] MD,  Christena Deem   . Hyperglycemia 05/20/2016  . Hyperlipidemia   . Hypomagnesemia   . Hypoxemia   . Iron deficiency anemia, unspecified   . Leukopenia   . Low vitamin D level   . Mild obstructive sleep apnea    no cpap recommendation  . Neuropathy 07/29/2017  . Neuropathy, peripheral, idiopathic   . Osteopenia   . Other allergic rhinitis   . Pelvic pain in female   . Polyarthralgia   . PONV (postoperative nausea and vomiting)   . Pre-diabetes   . Rheumatoid arthritis ([employer]) 07/29/2017  . Shoulder impingement syndrome 03/25/2015  . Sleep disorder, unspecified   . Spinal stenosis   . Stage 3 severe COPD by GOLD classification ([place]) 06/19/2007   Followed in Pulmonary clinic/ [facility]/ [place]  - hfa 90% 05/15/2012  - PFTs 05/15/2012 FEV1  0.61 ( 36%) ratio 57 and 21% better p B2 and DLCO 44%  106 corrected  - Referred to re[facility]ab 05/15/12 - Rec pulmonary f/u prn 02/11/13    . Supplemental oxygen dependent    2L  via Nasal Canula --  activity and nighttime  . Urinary incontinence in female     Past Surgical History:  Procedure Laterality Date  . APPENDECTOMY  1971  . CARDIAC CATHETERIZATION  05-08-2007  dr Marlou Porch  minor coronary plaquing w/ no significant CAD/  20% mLAD,  10% mRCA,  perserved LV, ef 60-65%  . CATARACT EXTRACTION W/ INTRAOCULAR LENS  IMPLANT, BILATERAL  2001 approx  . CHOLECYSTECTOMY  1988  . CYSTOSCOPY W/ RETROGRADES Bilateral 09/24/2014   Procedure: CYSTOSCOPY WITH RETROGRADE PYELOGRAM;  Surgeon: Festus Aloe, MD;  Location: [facility];  Service: Urology;  Laterality: Bilateral;  . CYSTOSCOPY WITH BIOPSY N/A 09/24/2014   Procedure: CYSTOSCOPY WITH BIOPSY;  Surgeon: Festus Aloe, MD;  Location: [facility];  Service: Urology;  Laterality: N/A;  . ESOPHAGOGASTRODUODENOSCOPY  last one 09-09-2008  . FULGURATION OF BLADDER TUMOR N/A 09/24/2014   Procedure: FULGURATION OF BLADDER TUMOR;  Surgeon: Festus Aloe, MD;  Location: [facility];  Service: Urology;  Laterality: N/A;  . [place]  . POSTERIOR LUMBAR FUSION  10-07-2007   bilateral laminectomy L4 and bilateral L4-5 diskectomy w/ fusion  . TONSILLECTOMY  as child  . TOTAL ABDOMINAL HYSTERECTOMY W/ BILATERAL SALPINGOOPHORECTOMY  1973  . TRANSTHORACIC ECHOCARDIOGRAM  07-07-2011   normal echo,  ef 65-70%    Vitals:   08/17/19 1106  SpO2: 96%    Subjective Assessment - 08/17/19 1107    Subjective  The patient was not able to bring her portable oxgen to therapy today (as discussed during evaluation).  Her vitals for spO2 range from 96 down to 89% with arriving.    Pertinent History  COPD, RA, GERD, recent hospitalizations (covid in 1/21).    Patient Stated Goals  Reduce falls    Currently in Pain?  Yes    Pain Location  Leg    Pain Orientation  Right    Pain Descriptors / Indicators  Aching    Aggravating Factors   hurts in R knee when she stands on it         [facility] PT Assessment - 08/17/19 1113      Ambulation/Gait   Ambulation Distance (Feet)  75 Feet   x 3 reps                  [facility] Adult PT Treatment/Exercise - 08/17/19 1113      Ambulation/Gait   Ambulation/Gait  Yes    Ambulation/Gait Assistance  4: Min guard    Assistive device  Straight cane   with tripod tip   Gait Pattern  Antalgic    Gait Comments  *Patient needs cues to use cane in contralateral UE (L) from R LE.  She needs assist for technique and sequencing cane.        Exercises   Exercises  Knee/Hip;Neck      Neck Exercises: Seated   Cervical Rotation  5 reps;Right;Left    Other Seated Exercise  Seated "t" for shoulder abduction x 5 reps    Other Seated Exercise  Seated alternating UE reaching for trunk elongation x 5 reps      Knee/Hip Exercises: Aerobic   Nustep  L3 x 4 minutes at end of session while we discussed HEP and safety with devices      Knee/Hip Exercises: Standing   Other Standing Knee Exercises  sit<>stand x 3 reps x 3  sets      Knee/Hip Exercises: Seated   Marching  Right;Left;Strengthening;5 reps      Knee/Hip Exercises: Supine   Bridges  5 reps    Straight Leg Raises  Right;Left;5 reps          Balance Exercises - 08/17/19 1123  OTAGO PROGRAM   Head Movements  Sitting;5 reps    Knee Flexor  5 reps    Ankle Plantorflexors  --   5 reps with support   Ankle Dorsiflexors  --   5 reps with support   Sideways Walking  Assistive device    Sit to Stand  --   3 reps x 4 sets       PT Education - 08/17/19 1145    Education Details  HEP progression    Person(s) Educated  Patient    Methods  Explanation;Demonstration;Handout    Comprehension  Verbalized understanding;Returned demonstration       PT Short Term Goals - 08/13/19 1207      PT SHORT TERM GOAL #1   Title  The patient will be indep with initial HEP.    Time  4    Period  Weeks    Target Date  09/12/19      PT SHORT TERM GOAL #2   Title  The patient will ambulate with RW mod indep x 150 feet.    Time  6    Period  Weeks    Target Date  09/12/19      PT SHORT TERM GOAL #3   Title  The patient will improve 5 time sit to stand to < or equal to 35 seconds.    Time  6    Period  Weeks    Target Date  09/12/19      PT SHORT TERM GOAL #4   Title  The patient will improve Berg score from 15/56 to > or equal to 25/56 to demonstrate dec'ing risk for falls.    Time  6    Period  Weeks    Target Date  09/12/19        PT Long Term Goals - 08/13/19 1207      PT LONG TERM GOAL #1   Title  The patient will be indep with HEP for LE strengthening, balance, and flexibility.    Time  8    Period  Weeks    Target Date  10/12/19      PT LONG TERM GOAL #2   Title  The patient will reduce 5 time sit<>stand to < or equal to 30 seconds to demonstrate improving LE strength.    Time  8    Period  Weeks    Target Date  10/12/19      PT LONG TERM GOAL #3   Title  The patient will imrpove Berg from 15/56 to > or equal to 30/56  to demonstrate improved steady state standing without UE Support.    Time  8    Period  Weeks    Target Date  10/12/19      PT LONG TERM GOAL #4   Title  The patient will tolerate walking x 3 minutes with use of RW and oxygen to demonstrate improving mobility for short community distances.    Time  8    Period  Weeks    Target Date  10/12/19      PT LONG TERM GOAL #5   Title  The patient will improve gait speed to > or equal to 1.8 ft/sec to demonstrate dec'd risk for fall.    Time  8    Period  Weeks    Target Date  10/12/19            Plan - 08/17/19 1155    Clinical Impression Statement  The patient did not bring supplemental oxygen today, but was able to maintain O2 level above 90 for the majority of our session.  We took sitting rest breaks with cues for pursed lip breathing and patient recovered within 2-3 minutes.  PT also focused on education of correct use of straight cane.  PT to continue to progress to tolerance.    Stability/Clinical Decision Making  Unstable/Unpredictable    Rehab Potential  Good    PT Frequency  2x / week    PT Duration  8 weeks    PT Treatment/Interventions  ADLs/Self Care Home Management;Gait training;Stair training;Functional mobility training;Therapeutic activities;Therapeutic exercise;Balance training;Neuromuscular re-education;Patient/family education;Vestibular;Manual techniques;DME Instruction;Moist Heat;Passive range of motion    PT Next Visit Plan  check spO2, monitor during session (patient should bring home O2), [place] balance HEP near countertop to initiate strengthening, safety with ambulation (encourage walker use-- may need to work on low back positioning/stretching to tolerate walker), standing balance.    PT Home Exercise Plan  Access Code: [email or web address]    Consulted and Agree with Plan of Care  Patient       Patient will benefit from skilled therapeutic intervention in order to improve the following deficits and impairments:  Abnormal  gait, Difficulty walking, Decreased strength, Postural dysfunction, Impaired flexibility, Hypomobility, Pain, Decreased activity tolerance, Decreased balance  Visit Diagnosis: Other abnormalities of gait and mobility  Unsteadiness on feet  Muscle weakness (generalized)  History of falling  Dizziness and giddiness     Problem List Patient Active Problem List   Diagnosis Date Noted  . Diarrhea 07/16/2019  . AKI (acute kidney injury) ([place]) 07/16/2019  . Falls frequently 07/16/2019  . COVID-19 virus infection 05/16/2019  . Community acquired pneumonia   . Acute on chronic respiratory failure with hypoxia ([place]) 06/08/2018  . Acute respiratory failure with hypercapnia ([place])   . COPD exacerbation ([place]) 07/29/2017  . Rheumatoid arthritis ([place]) 07/29/2017  . Neuropathy 07/29/2017  . Acute respiratory failure with hypoxia ([place]) 07/29/2017  . Anemia 05/20/2016  . Hyperglycemia 05/20/2016  . Shoulder impingement syndrome 03/25/2015  . Chronic respiratory failure ([place]) 04/10/2012  . Constipation 07/03/2011  . COPD with acute exacerbation ([place]) 06/29/2011  . Stage 3 severe COPD by GOLD classification ([place]) 06/19/2007  . HLD (hyperlipidemia) 05/15/2007  . ACID REFLUX DISEASE 05/15/2007    [place] , PT 08/17/2019, 1:05 PM  [facility] [place], [place], [address] Phone: [phone]   Fax:  [phone]  Name: [name] MRN: [id number] Date of Birth: April 23, 1936

## 2019-08-17 NOTE — Patient Instructions (Signed)
Access Code: [email or web address] URL: [email or web address] Date: 08/17/2019 Prepared by: Rudell Cobb  Exercises Seated Hamstring Stretch - 2 x daily - 7 x weekly - 1 sets - 2 reps - 20 seconds hold Seated Long Arc Quad - 2 x daily - 7 x weekly - 10 reps - 1 sets Sit to Stand with Armchair - 2 x daily - 7 x weekly - 1 sets - 5 reps Heel Toe Raises with Counter Support - 2 x daily - 7 x weekly - 1 sets - 10 reps Side Stepping with Counter Support - 2 x daily - 7 x weekly - 1 sets - 10 reps Standing Gastroc Stretch - 2 x daily - 7 x weekly - 1 sets - 2 reps - 20 seconds hold

## 2019-08-18 DIAGNOSIS — J449 Chronic obstructive pulmonary disease, unspecified: Secondary | ICD-10-CM | POA: Diagnosis not present

## 2019-08-18 DIAGNOSIS — M25561 Pain in right knee: Secondary | ICD-10-CM | POA: Diagnosis not present

## 2019-08-20 ENCOUNTER — Ambulatory Visit (INDEPENDENT_AMBULATORY_CARE_PROVIDER_SITE_OTHER): Payer: Medicare Other | Admitting: Physical Therapy

## 2019-08-20 ENCOUNTER — Other Ambulatory Visit: Payer: Self-pay

## 2019-08-20 DIAGNOSIS — M6281 Muscle weakness (generalized): Secondary | ICD-10-CM

## 2019-08-20 DIAGNOSIS — R2689 Other abnormalities of gait and mobility: Secondary | ICD-10-CM

## 2019-08-20 DIAGNOSIS — R2681 Unsteadiness on feet: Secondary | ICD-10-CM | POA: Diagnosis not present

## 2019-08-20 DIAGNOSIS — Z9181 History of falling: Secondary | ICD-10-CM
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
## 2019-08-24 ENCOUNTER — Ambulatory Visit (INDEPENDENT_AMBULATORY_CARE_PROVIDER_SITE_OTHER): Payer: Medicare Other | Admitting: Rehabilitative and Restorative Service Providers"

## 2019-08-24 ENCOUNTER — Other Ambulatory Visit: Payer: Self-pay

## 2019-08-24 DIAGNOSIS — R2681 Unsteadiness on feet: Secondary | ICD-10-CM | POA: Diagnosis not present

## 2019-08-24 DIAGNOSIS — R42 Dizziness and giddiness: Secondary | ICD-10-CM | POA: Diagnosis not present

## 2019-08-24 DIAGNOSIS — R2689 Other abnormalities of gait and mobility: Secondary | ICD-10-CM

## 2019-08-24 DIAGNOSIS — Z9181 History of falling: Secondary | ICD-10-CM | POA: Diagnosis not present

## 2019-08-24 DIAGNOSIS — M6281 Muscle weakness (generalized): Secondary | ICD-10-CM

## 2019-08-24 NOTE — Therapy (Signed)
[place], [place], [address] Phone: [phone]   Fax:  [phone]  Physical Therapy Treatment  Patient Details  Name: [name] MRN: [id number] Date of Birth: [birth year] Referring Provider (PT): [name], MD   Encounter Date: 08/24/2019  PT End of Session - 08/24/19 1139    Visit Number  4    Number of Visits  16    Date for PT Re-Evaluation  10/12/19    PT Start Time  1100    PT Stop Time  1146    PT Time Calculation (min)  46 min    Equipment Utilized During Treatment  Gait belt    Activity Tolerance  Patient tolerated treatment well    Behavior During Therapy  [facility] for tasks assessed/performed       Past Medical History:  Diagnosis Date  . ACID REFLUX DISEASE 05/15/2007   Qualifier: Diagnosis of  By: [name] MD, Christena Deem   . Acute and chronic respiratory failure with hypoxia ([place])   . Acute on chronic respiratory failure with hypoxia ([place]) 06/08/2018  . Acute respiratory failure with hypercapnia ([place])   . Acute respiratory failure with hypoxia ([place]) 07/29/2017  . Anemia 05/20/2016  . Arthritis   . Bladder neoplasm   . Body mass index 34.0-34.9, adult   . Bronchitis 07/06/2011  . Chest pain 07/06/2011  . Chronic cough   . Chronic respiratory failure ([place])   . Community acquired pneumonia   . Constipation 07/03/2011  . COPD (chronic obstructive pulmonary disease) (HCC)    PULMOLOGIST-  DR [name]--  GOLD III W/  CHRONIC RESPIRATORY FAILURE--  O2 DEPENDENT  . COPD with acute exacerbation ([place]) 06/29/2011  . Coronary arteriosclerosis   . Cough productive of purulent sputum   . Depression   . Dyslipidemia   . Falls frequently   . Fibromyalgia   . GERD (gastroesophageal reflux disease)   . Hearing loss in right ear   . Hematuria   . History of nephritis    as child dx w/ Bright's disease (glomerulonephritis)  . HLD (hyperlipidemia) 05/15/2007   Qualifier: Diagnosis of  By: [name] MD,  Christena Deem   . Hyperglycemia 05/20/2016  . Hyperlipidemia   . Hypomagnesemia   . Hypoxemia   . Iron deficiency anemia, unspecified   . Leukopenia   . Low vitamin D level   . Mild obstructive sleep apnea    no cpap recommendation  . Neuropathy 07/29/2017  . Neuropathy, peripheral, idiopathic   . Osteopenia   . Other allergic rhinitis   . Pelvic pain in female   . Polyarthralgia   . PONV (postoperative nausea and vomiting)   . Pre-diabetes   . Rheumatoid arthritis ([place]) 07/29/2017  . Shoulder impingement syndrome 03/25/2015  . Sleep disorder, unspecified   . Spinal stenosis   . Stage 3 severe COPD by GOLD classification ([place]) 06/19/2007   Followed in Pulmonary clinic/ [facility]/ [facility]  - hfa 90% 05/15/2012  - PFTs 05/15/2012 FEV1  0.61 ( 36%) ratio 57 and 21% better p B2 and DLCO 44%  106 corrected  - Referred to re[facility]ab 05/15/12 - Rec pulmonary f/u prn 02/11/13    . Supplemental oxygen dependent    2L  via Nasal Canula --  activity and nighttime  . Urinary incontinence in female     Past Surgical History:  Procedure Laterality Date  . APPENDECTOMY  1971  . CARDIAC CATHETERIZATION  05-08-2007  dr Marlou Porch  minor coronary plaquing w/ no significant CAD/  20% mLAD,  10% mRCA,  perserved LV, ef 60-65%  . CATARACT EXTRACTION W/ INTRAOCULAR LENS  IMPLANT, BILATERAL  2001 approx  . CHOLECYSTECTOMY  1988  . CYSTOSCOPY W/ RETROGRADES Bilateral 09/24/2014   Procedure: CYSTOSCOPY WITH RETROGRADE PYELOGRAM;  Surgeon: Festus Aloe, MD;  Location: [facility];  Service: Urology;  Laterality: Bilateral;  . CYSTOSCOPY WITH BIOPSY N/A 09/24/2014   Procedure: CYSTOSCOPY WITH BIOPSY;  Surgeon: Festus Aloe, MD;  Location: [facility];  Service: Urology;  Laterality: N/A;  . ESOPHAGOGASTRODUODENOSCOPY  last one 09-09-2008  . FULGURATION OF BLADDER TUMOR N/A 09/24/2014   Procedure: FULGURATION OF BLADDER TUMOR;  Surgeon: Festus Aloe, MD;  Location: [facility];  Service: Urology;  Laterality: N/A;  . [place]  . POSTERIOR LUMBAR FUSION  10-07-2007   bilateral laminectomy L4 and bilateral L4-5 diskectomy w/ fusion  . TONSILLECTOMY  as child  . TOTAL ABDOMINAL HYSTERECTOMY W/ BILATERAL SALPINGOOPHORECTOMY  1973  . TRANSTHORACIC ECHOCARDIOGRAM  07-07-2011   normal echo,  ef 65-70%    There were no vitals filed for this visit.  Subjective Assessment - 08/24/19 1104    Subjective  The patient reports she was sore after last session and couldn't get out of the chair the next day.    Pertinent History  COPD, RA, GERD, recent hospitalizations (covid in 1/21).    Patient Stated Goals  Reduce falls    Currently in Pain?  Yes    Pain Score  6    better today   Pain Location  Knee    Pain Orientation  Right    Pain Type  Chronic pain    Pain Onset  More than a month ago    Pain Frequency  Intermittent    Aggravating Factors   hurts with standing    Pain Relieving Factors  rest                       [facility] Adult PT Treatment/Exercise - 08/24/19 1106      Ambulation/Gait   Ambulation/Gait  Yes    Ambulation/Gait Assistance  4: Min guard;4: Min assist    Ambulation/Gait Assistance Details  PT provides CGA with occasional min A due to catching toes/tripping during gait.    Ambulation Distance (Feet)  150 Feet    Assistive device  None   forgot her cane today   Gait Pattern  Antalgic;Decreased stride length;Decreased dorsiflexion - left;Decreased dorsiflexion - right;Right foot flat;Left foot flat   right side painful   Gait Comments  patient catches her feet during gait and needs min A to recover.      Self-Care   Self-Care  Other Self-Care Comments    Other Self-Care Comments   patient's oxygen starts at 88% and improves to 93% after resting.    Reviewed importance of assistive device to reduce risk for falls.      Exercises   Exercises  Knee/Hip;Neck      Neck Exercises: Seated   Other Seated  Exercise  Seated "t" for shoulder abduction x 5 reps    Other Seated Exercise  Seated alternating UE reaching for trunk elongation x 5 reps      Knee/Hip Exercises: Stretches   Active Hamstring Stretch  Right;Left;1 rep;20 seconds      Knee/Hip Exercises: Aerobic   Nustep  L4 x 5 minutes UEs/LEs at end of session.  Knee/Hip Exercises: Supine   Bridges  5 reps    Straight Leg Raise with External Rotation  Right;Left;10 reps    Other Supine Knee/Hip Exercises  supine marching with cues for core stability x 10 reps          Balance Exercises - 08/24/19 1107      OTAGO PROGRAM   Head Movements  Standing;5 reps    Neck Movements  Standing;5 reps    Trunk Movements  Standing;5 reps    Ankle Movements  Sitting;10 reps    Knee Extensor  10 reps    Knee Flexor  5 reps    Ankle Plantorflexors  --   10 reps   Ankle Dorsiflexors  --   10 reps   Sideways Walking  Assistive device    Sit to Stand  5 reps, bilateral support    Overall OTAGO Comments  spO2 dec'd to 86% with standing activities and PT switched to sitting ankle pumps and cued pursed lip breathing to return to > 90 on spoO2          PT Short Term Goals - 08/13/19 1207      PT SHORT TERM GOAL #1   Title  The patient will be indep with initial HEP.    Time  4    Period  Weeks    Target Date  09/12/19      PT SHORT TERM GOAL #2   Title  The patient will ambulate with RW mod indep x 150 feet.    Time  6    Period  Weeks    Target Date  09/12/19      PT SHORT TERM GOAL #3   Title  The patient will improve 5 time sit to stand to < or equal to 35 seconds.    Time  6    Period  Weeks    Target Date  09/12/19      PT SHORT TERM GOAL #4   Title  The patient will improve Berg score from 15/56 to > or equal to 25/56 to demonstrate dec'ing risk for falls.    Time  6    Period  Weeks    Target Date  09/12/19        PT Long Term Goals - 08/13/19 1207      PT LONG TERM GOAL #1   Title  The patient will be  indep with HEP for LE strengthening, balance, and flexibility.    Time  8    Period  Weeks    Target Date  10/12/19      PT LONG TERM GOAL #2   Title  The patient will reduce 5 time sit<>stand to < or equal to 30 seconds to demonstrate improving LE strength.    Time  8    Period  Weeks    Target Date  10/12/19      PT LONG TERM GOAL #3   Title  The patient will imrpove Berg from 15/56 to > or equal to 30/56 to demonstrate improved steady state standing without UE Support.    Time  8    Period  Weeks    Target Date  10/12/19      PT LONG TERM GOAL #4   Title  The patient will tolerate walking x 3 minutes with use of RW and oxygen to demonstrate improving mobility for short community distances.    Time  8    Period  Weeks  Target Date  10/12/19      PT LONG TERM GOAL #5   Title  The patient will improve gait speed to > or equal to 1.8 ft/sec to demonstrate dec'd risk for fall.    Time  8    Period  Weeks    Target Date  10/12/19            Plan - 08/24/19 1140    Clinical Impression Statement  The patient tolerates ther ex well with occasional rest breaks while monitoring spO2 (she does not carry O2 with her to therapy or use in the community).    She responds well to pursed lip breathing.  PT continuing to progress [place] HEP, LE strengthening and balance.  Patient forgot her cane today and PT accompanied her to car at end of session for safety.    PT Treatment/Interventions  ADLs/Self Care Home Management;Gait training;Stair training;Functional mobility training;Therapeutic activities;Therapeutic exercise;Balance training;Neuromuscular re-education;Patient/family education;Vestibular;Manual techniques;DME Instruction;Moist Heat;Passive range of motion    PT Next Visit Plan  Try clinic walker, check spO2, monitor during session (patient should bring home O2), [place] balance HEP near countertop to initiate strengthening, safety with ambulation (encourage walker use-- may need to  work on low back positioning/stretching to tolerate walker), standing balance.    PT Home Exercise Plan  Access Code: [email or web address]    Consulted and Agree with Plan of Care  Patient       Patient will benefit from skilled therapeutic intervention in order to improve the following deficits and impairments:  Abnormal gait, Difficulty walking, Decreased strength, Postural dysfunction, Impaired flexibility, Hypomobility, Pain, Decreased activity tolerance, Decreased balance  Visit Diagnosis: Other abnormalities of gait and mobility  Unsteadiness on feet  Muscle weakness (generalized)  History of falling  Dizziness and giddiness     Problem List Patient Active Problem List   Diagnosis Date Noted  . Diarrhea 07/16/2019  . AKI (acute kidney injury) ([employer]) 07/16/2019  . Falls frequently 07/16/2019  . COVID-19 virus infection 05/16/2019  . Community acquired pneumonia   . Acute on chronic respiratory failure with hypoxia ([place]) 06/08/2018  . Acute respiratory failure with hypercapnia ([place])   . COPD exacerbation ([place]) 07/29/2017  . Rheumatoid arthritis ([place]) 07/29/2017  . Neuropathy 07/29/2017  . Acute respiratory failure with hypoxia ([place]) 07/29/2017  . Anemia 05/20/2016  . Hyperglycemia 05/20/2016  . Shoulder impingement syndrome 03/25/2015  . Chronic respiratory failure ([place]) 04/10/2012  . Constipation 07/03/2011  . COPD with acute exacerbation ([place]) 06/29/2011  . Stage 3 severe COPD by GOLD classification ([place]) 06/19/2007  . HLD (hyperlipidemia) 05/15/2007  . ACID REFLUX DISEASE 05/15/2007    Ferdinand Revoir, PT 08/24/2019, 11:50 AM  [facility] [place], [place], [address] Phone: [phone]   Fax:  [phone]  Name: [name] MRN: [id number] Date of Birth: 1936/02/03

## 2019-08-27 ENCOUNTER — Other Ambulatory Visit: Payer: Self-pay

## 2019-08-27 ENCOUNTER — Ambulatory Visit (INDEPENDENT_AMBULATORY_CARE_PROVIDER_SITE_OTHER): Payer: Medicare Other | Admitting: Physical Therapy

## 2019-08-27 ENCOUNTER — Encounter: Payer: Self-pay | Admitting: Physical Therapy

## 2019-08-27 DIAGNOSIS — R42 Dizziness and giddiness: Secondary | ICD-10-CM

## 2019-08-27 DIAGNOSIS — R2689 Other abnormalities of gait and mobility: Secondary | ICD-10-CM | POA: Diagnosis not present

## 2019-08-27 DIAGNOSIS — R2681 Unsteadiness on feet: Secondary | ICD-10-CM

## 2019-08-27 DIAGNOSIS — Z9181 History of falling: Secondary | ICD-10-CM | POA: Diagnosis not present

## 2019-08-27 DIAGNOSIS — M6281 Muscle weakness (generalized): Secondary | ICD-10-CM

## 2019-08-27 NOTE — Therapy (Signed)
[place], [place], [address] Phone: [phone]   Fax:  [phone]  Physical Therapy Treatment  Patient Details  Name: [name] MRN: [id number] Date of Birth: 03/06/1936 Referring Provider (PT): [name], MD   Encounter Date: 08/27/2019  PT End of Session - 08/27/19 1148    Visit Number  5    Number of Visits  16    Date for PT Re-Evaluation  10/12/19    PT Start Time  9678    PT Stop Time  1230    PT Time Calculation (min)  42 min    Equipment Utilized During Treatment  Gait belt    Behavior During Therapy  [facility] for tasks assessed/performed       Past Medical History:  Diagnosis Date  . ACID REFLUX DISEASE 05/15/2007   Qualifier: Diagnosis of  By: [name] MD, Christena Deem   . Acute and chronic respiratory failure with hypoxia ([place])   . Acute on chronic respiratory failure with hypoxia ([place]) 06/08/2018  . Acute respiratory failure with hypercapnia ([place])   . Acute respiratory failure with hypoxia ([place]) 07/29/2017  . Anemia 05/20/2016  . Arthritis   . Bladder neoplasm   . Body mass index 34.0-34.9, adult   . Bronchitis 07/06/2011  . Chest pain 07/06/2011  . Chronic cough   . Chronic respiratory failure ([place])   . Community acquired pneumonia   . Constipation 07/03/2011  . COPD (chronic obstructive pulmonary disease) (HCC)    PULMOLOGIST-  DR [name]--  GOLD III W/  CHRONIC RESPIRATORY FAILURE--  O2 DEPENDENT  . COPD with acute exacerbation ([place]) 06/29/2011  . Coronary arteriosclerosis   . Cough productive of purulent sputum   . Depression   . Dyslipidemia   . Falls frequently   . Fibromyalgia   . GERD (gastroesophageal reflux disease)   . Hearing loss in right ear   . Hematuria   . History of nephritis    as child dx w/ Bright's disease (glomerulonephritis)  . HLD (hyperlipidemia) 05/15/2007   Qualifier: Diagnosis of  By: [name] MD, Christena Deem   . Hyperglycemia 05/20/2016  . Hyperlipidemia    . Hypomagnesemia   . Hypoxemia   . Iron deficiency anemia, unspecified   . Leukopenia   . Low vitamin D level   . Mild obstructive sleep apnea    no cpap recommendation  . Neuropathy 07/29/2017  . Neuropathy, peripheral, idiopathic   . Osteopenia   . Other allergic rhinitis   . Pelvic pain in female   . Polyarthralgia   . PONV (postoperative nausea and vomiting)   . Pre-diabetes   . Rheumatoid arthritis ([place]) 07/29/2017  . Shoulder impingement syndrome 03/25/2015  . Sleep disorder, unspecified   . Spinal stenosis   . Stage 3 severe COPD by GOLD classification ([place]) 06/19/2007   Followed in Pulmonary clinic/ [facility]/ [facility]  - hfa 90% 05/15/2012  - PFTs 05/15/2012 FEV1  0.61 ( 36%) ratio 57 and 21% better p B2 and DLCO 44%  106 corrected  - Referred to re[facility] 05/15/12 - Rec pulmonary f/u prn 02/11/13    . Supplemental oxygen dependent    2L  via Nasal Canula --  activity and nighttime  . Urinary incontinence in female     Past Surgical History:  Procedure Laterality Date  . APPENDECTOMY  1971  . CARDIAC CATHETERIZATION  05-08-2007  dr Marlou Porch   minor coronary plaquing w/ no significant CAD/  20%  mLAD,  10% mRCA,  perserved LV, ef 60-65%  . CATARACT EXTRACTION W/ INTRAOCULAR LENS  IMPLANT, BILATERAL  2001 approx  . CHOLECYSTECTOMY  1988  . CYSTOSCOPY W/ RETROGRADES Bilateral 09/24/2014   Procedure: CYSTOSCOPY WITH RETROGRADE PYELOGRAM;  Surgeon: Festus Aloe, MD;  Location: [facility];  Service: Urology;  Laterality: Bilateral;  . CYSTOSCOPY WITH BIOPSY N/A 09/24/2014   Procedure: CYSTOSCOPY WITH BIOPSY;  Surgeon: Festus Aloe, MD;  Location: [facility];  Service: Urology;  Laterality: N/A;  . ESOPHAGOGASTRODUODENOSCOPY  last one 09-09-2008  . FULGURATION OF BLADDER TUMOR N/A 09/24/2014   Procedure: FULGURATION OF BLADDER TUMOR;  Surgeon: Festus Aloe, MD;  Location: [facility];  Service: Urology;  Laterality: N/A;  .  [place]  . POSTERIOR LUMBAR FUSION  10-07-2007   bilateral laminectomy L4 and bilateral L4-5 diskectomy w/ fusion  . TONSILLECTOMY  as child  . TOTAL ABDOMINAL HYSTERECTOMY W/ BILATERAL SALPINGOOPHORECTOMY  1973  . TRANSTHORACIC ECHOCARDIOGRAM  07-07-2011   normal echo,  ef 65-70%    There were no vitals filed for this visit.  Subjective Assessment - 08/27/19 1150    Subjective  Pt reports pain in Rt knee today. She states that she has not fallen at all this week and wants to keep getting stronger to prevent falling in future.    Pertinent History  COPD, RA, GERD, recent hospitalizations (covid in 1/21).    Currently in Pain?  Yes    Pain Score  7     Pain Location  Knee    Pain Orientation  Right    Aggravating Factors   walking    Pain Relieving Factors  resting         [facility] PT Assessment - 08/27/19 0001      Transfers   Sit to Stand  5: Supervision    Sit to Stand Details  --    Five time sit to stand comments   30.1   use of bilat hands to sit and stand     Standardized Balance Assessment   Standardized Balance Assessment  --          [facility] Adult PT Treatment/Exercise - 08/27/19 0001      Ambulation/Gait   Ambulation/Gait  Yes    Ambulation/Gait Assistance  4: Min guard    Ambulation/Gait Assistance Details  SPTA min A with for safety due to uneven pavement  Outdoors.  spO2 monitored, cues for pacing.    Ambulation Distance (Feet)  80 ft; 150 Feet    Assistive device  Straight cane   small square base- similar to hurrycane   Gait Pattern  Antalgic;Decreased stride length   switched cane Rt and Lt X 2     Neck Exercises: Machines for Strengthening   Nustep  L3, 4 mins   O2 to 93% to 84%- exercise discontinued after 4 mins     Neck Exercises: Seated   Other Seated Exercise  Seated "T" for  bilat shoulder abduction x 10 reps, 2 sets    Other Seated Exercise  Seated alternating UE flexion for trunk elongation x 10 reps, 2 sets      Knee/Hip  Exercises: Stretches   Active Hamstring Stretch  Right;Left;20 seconds;2 reps, seated with straight back              Knee/Hip Exercises: Seated   Clamshell with TheraBand  Yellow   2 sets of 10, 3 second hold   Marching  Strengthening;Right;Left;1 set;10 reps      Knee/Hip Exercises: Supine   Heel Slides  Strengthening;Right;Left;1 set;5 reps    Straight Leg Raises  Right;Left;5 reps;Strengthening   cues for pace        PT Short Term Goals - 08/27/19 1302      PT SHORT TERM GOAL #1   Title  The patient will be indep with initial HEP.    Time  4    Period  Weeks    Target Date  09/12/19      PT SHORT TERM GOAL #2   Title  The patient will ambulate with RW mod indep x 150 feet.    Time  6    Period  Weeks    Status  On-going    Target Date  09/12/19      PT SHORT TERM GOAL #3   Title  The patient will improve 5 time sit to stand to < or equal to 35 seconds.    Baseline  30.1 second (08/27/19)    Time  6    Period  Weeks    Status  Achieved    Target Date  09/12/19      PT SHORT TERM GOAL #4   Title  The patient will improve Berg score from 15/56 to > or equal to 25/56 to demonstrate dec'ing risk for falls.    Time  6    Period  Weeks    Status  On-going    Target Date  09/12/19        PT Long Term Goals - 08/27/19 1303      PT LONG TERM GOAL #1   Title  The patient will be indep with HEP for LE strengthening, balance, and flexibility.    Time  8    Period  Weeks    Status  On-going      PT LONG TERM GOAL #2   Title  The patient will reduce 5 time sit<>stand to < or equal to 30 seconds to demonstrate improving LE strength.    Time  8    Period  Weeks    Status  On-going      PT LONG TERM GOAL #3   Title  The patient will imrpove Berg from 15/56 to > or equal to 30/56 to demonstrate improved steady state standing without UE Support.    Time  8    Period  Weeks      PT LONG TERM GOAL #4   Title  The patient will tolerate walking x 3 minutes with use  of RW and oxygen to demonstrate improving mobility for short community distances.    Time  8    Period  Weeks    Status  On-going      PT LONG TERM GOAL #5   Title  The patient will improve gait speed to > or equal to 1.8 ft/sec to demonstrate dec'd risk for fall.    Time  8    Period  Weeks    Status  On-going            Plan - 08/27/19 1304    Clinical Impression Statement  Pt tolerated exercises well no verbalized increase in Rt knee pain, O2 levels stayed within 87-93 % range. She required short seated rest breaks and cues for pursed lip breathing between exercises. Pt has met 5x sit to stand STG goal. Exercise tolerance is improving each visit. All other goals on-going; pt is progressing  well.    Personal Factors and Comorbidities  Comorbidity 1;Comorbidity 3+;Comorbidity 2;Other    Comorbidities  frequent falls, COPD, RA, recent covid infection (1/21), dec'd safety awareness    Examination-Activity Limitations  Locomotion Level;Squat;Stairs;Stand    Examination-Participation Restrictions  Community Activity;Shop    Stability/Clinical Decision Making  Unstable/Unpredictable    Rehab Potential  Good    PT Frequency  2x / week    PT Duration  8 weeks    PT Treatment/Interventions  ADLs/Self Care Home Management;Gait training;Stair training;Functional mobility training;Therapeutic activities;Therapeutic exercise;Balance training;Neuromuscular re-education;Patient/family education;Vestibular;Manual techniques;DME Instruction;Moist Heat;Passive range of motion    PT Next Visit Plan  Try clinic walker, check spO2, monitor during session (patient should bring home O2), [place] balance HEP near countertop to initiate strengthening, safety with ambulation (encourage walker use-- may need to work on low back positioning/stretching to tolerate walker), standing balance.    PT Home Exercise Plan  Access Code: [email or web address]    Consulted and Agree with Plan of Care  Patient       Patient will  benefit from skilled therapeutic intervention in order to improve the following deficits and impairments:  Abnormal gait, Difficulty walking, Decreased strength, Postural dysfunction, Impaired flexibility, Hypomobility, Pain, Decreased activity tolerance, Decreased balance  Visit Diagnosis: Other abnormalities of gait and mobility  Unsteadiness on feet  Muscle weakness (generalized)  History of falling  Dizziness and giddiness     Problem List Patient Active Problem List   Diagnosis Date Noted  . Diarrhea 07/16/2019  . AKI (acute kidney injury) ([place]) 07/16/2019  . Falls frequently 07/16/2019  . COVID-19 virus infection 05/16/2019  . Community acquired pneumonia   . Acute on chronic respiratory failure with hypoxia ([place]) 06/08/2018  . Acute respiratory failure with hypercapnia ([place])   . COPD exacerbation ([place]) 07/29/2017  . Rheumatoid arthritis ([place]) 07/29/2017  . Neuropathy 07/29/2017  . Acute respiratory failure with hypoxia ([place]) 07/29/2017  . Anemia 05/20/2016  . Hyperglycemia 05/20/2016  . Shoulder impingement syndrome 03/25/2015  . Chronic respiratory failure ([place]) 04/10/2012  . Constipation 07/03/2011  . COPD with acute exacerbation ([place]) 06/29/2011  . Stage 3 severe COPD by GOLD classification ([place]) 06/19/2007  . HLD (hyperlipidemia) 05/15/2007  . ACID REFLUX DISEASE 05/15/2007    Ronaldo Miyamoto, SPTA 08/27/19 1:35 PM  This entire session was performed under direct supervision and direction of a licensed Physical Brewing technologist. I have personally read, edited and approved of the note as written.  Kerin Perna, PTA 08/27/19 2:25 PM   [place], [place], [address] Phone: [phone]   Fax:  [phone]  Name: [name] MRN: [id number] Date of Birth: [birth year]

## 2019-08-29 DIAGNOSIS — M25561 Pain in right knee: Secondary | ICD-10-CM | POA: Diagnosis not present

## 2019-08-31 ENCOUNTER — Encounter: Payer: Self-pay | Admitting: Physical Therapy

## 2019-08-31 ENCOUNTER — Other Ambulatory Visit: Payer: Self-pay

## 2019-08-31 ENCOUNTER — Ambulatory Visit (INDEPENDENT_AMBULATORY_CARE_PROVIDER_SITE_OTHER): Payer: Medicare Other | Admitting: Physical Therapy

## 2019-08-31 DIAGNOSIS — Z9181 History of falling: Secondary | ICD-10-CM | POA: Diagnosis not present

## 2019-08-31 DIAGNOSIS — M6281 Muscle weakness (generalized): Secondary | ICD-10-CM

## 2019-08-31 DIAGNOSIS — R2689 Other abnormalities of gait and mobility: Secondary | ICD-10-CM | POA: Diagnosis not present

## 2019-08-31 DIAGNOSIS — R2681 Unsteadiness on feet: Secondary | ICD-10-CM | POA: Diagnosis not present

## 2019-08-31 DIAGNOSIS — R918 Other nonspecific abnormal finding of lung field: Secondary | ICD-10-CM | POA: Diagnosis not present

## 2019-08-31 NOTE — Therapy (Signed)
[facility] [place], [place], [address] Phone: [phone]   Fax:  [phone]  Physical Therapy Treatment  Patient Details  Name: [name] MRN: [id number] Date of Birth: 04/15/1936 Referring Provider (PT): [name], MD   Encounter Date: 08/31/2019  PT End of Session - 08/31/19 1108    Visit Number  6    Number of Visits  16    Date for PT Re-Evaluation  10/12/19    PT Start Time  1103    PT Stop Time  1146    PT Time Calculation (min)  43 min    Equipment Utilized During Treatment  Gait belt    Activity Tolerance  Patient tolerated treatment well    Behavior During Therapy  [facility] for tasks assessed/performed       Past Medical History:  Diagnosis Date  . ACID REFLUX DISEASE 05/15/2007   Qualifier: Diagnosis of  By: [name] MD, Christena Deem   . Acute and chronic respiratory failure with hypoxia ([place])   . Acute on chronic respiratory failure with hypoxia ([place]) 06/08/2018  . Acute respiratory failure with hypercapnia ([place])   . Acute respiratory failure with hypoxia ([place]) 07/29/2017  . Anemia 05/20/2016  . Arthritis   . Bladder neoplasm   . Body mass index 34.0-34.9, adult   . Bronchitis 07/06/2011  . Chest pain 07/06/2011  . Chronic cough   . Chronic respiratory failure ([place])   . Community acquired pneumonia   . Constipation 07/03/2011  . COPD (chronic obstructive pulmonary disease) (HCC)    PULMOLOGIST-  DR [name]--  GOLD III W/  CHRONIC RESPIRATORY FAILURE--  O2 DEPENDENT  . COPD with acute exacerbation ([place]) 06/29/2011  . Coronary arteriosclerosis   . Cough productive of purulent sputum   . Depression   . Dyslipidemia   . Falls frequently   . Fibromyalgia   . GERD (gastroesophageal reflux disease)   . Hearing loss in right ear   . Hematuria   . History of nephritis    as child dx w/ Bright's disease (glomerulonephritis)  . HLD (hyperlipidemia) 05/15/2007   Qualifier: Diagnosis of  By: [name] MD,  Christena Deem   . Hyperglycemia 05/20/2016  . Hyperlipidemia   . Hypomagnesemia   . Hypoxemia   . Iron deficiency anemia, unspecified   . Leukopenia   . Low vitamin D level   . Mild obstructive sleep apnea    no cpap recommendation  . Neuropathy 07/29/2017  . Neuropathy, peripheral, idiopathic   . Osteopenia   . Other allergic rhinitis   . Pelvic pain in female   . Polyarthralgia   . PONV (postoperative nausea and vomiting)   . Pre-diabetes   . Rheumatoid arthritis ([place]) 07/29/2017  . Shoulder impingement syndrome 03/25/2015  . Sleep disorder, unspecified   . Spinal stenosis   . Stage 3 severe COPD by GOLD classification ([place]) 06/19/2007   Followed in Pulmonary clinic/ [facility]/ [place]  - hfa 90% 05/15/2012  - PFTs 05/15/2012 FEV1  0.61 ( 36%) ratio 57 and 21% better p B2 and DLCO 44%  106 corrected  - Referred to reahab 05/15/12 - Rec pulmonary f/u prn 02/11/13    . Supplemental oxygen dependent    2L  via Nasal Canula --  activity and nighttime  . Urinary incontinence in female     Past Surgical History:  Procedure Laterality Date  . APPENDECTOMY  1971  . CARDIAC CATHETERIZATION  05-08-2007  dr Marlou Porch  minor coronary plaquing w/ no significant CAD/  20% mLAD,  10% mRCA,  perserved LV, ef 60-65%  . CATARACT EXTRACTION W/ INTRAOCULAR LENS  IMPLANT, BILATERAL  2001 approx  . CHOLECYSTECTOMY  1988  . CYSTOSCOPY W/ RETROGRADES Bilateral 09/24/2014   Procedure: CYSTOSCOPY WITH RETROGRADE PYELOGRAM;  Surgeon: Festus Aloe, MD;  Location: [facility];  Service: Urology;  Laterality: Bilateral;  . CYSTOSCOPY WITH BIOPSY N/A 09/24/2014   Procedure: CYSTOSCOPY WITH BIOPSY;  Surgeon: Festus Aloe, MD;  Location: [facility];  Service: Urology;  Laterality: N/A;  . ESOPHAGOGASTRODUODENOSCOPY  last one 09-09-2008  . FULGURATION OF BLADDER TUMOR N/A 09/24/2014   Procedure: FULGURATION OF BLADDER TUMOR;  Surgeon: Festus Aloe, MD;  Location: [facility];  Service: Urology;  Laterality: N/A;  . [place]  . POSTERIOR LUMBAR FUSION  10-07-2007   bilateral laminectomy L4 and bilateral L4-5 diskectomy w/ fusion  . TONSILLECTOMY  as child  . TOTAL ABDOMINAL HYSTERECTOMY W/ BILATERAL SALPINGOOPHORECTOMY  1973  . TRANSTHORACIC ECHOCARDIOGRAM  07-07-2011   normal echo,  ef 65-70%    There were no vitals filed for this visit.  Subjective Assessment - 08/31/19 1109    Subjective  Pt reports she hasn't had any falls in 2 weeks.  She states she's getting stronger, but "still feels wobbly".   She had an MRI of knee over weekend.    Pertinent History  COPD, RA, GERD, recent hospitalizations (covid in 1/21).    Currently in Pain?  Yes    Pain Score  7     Pain Location  Knee    Pain Orientation  Right    Pain Descriptors / Indicators  Aching    Aggravating Factors   walking    Pain Relieving Factors  rest         [facility] PT Assessment - 08/31/19 0001      Assessment   Medical Diagnosis  Frequent falls, R hip/knee pain, walker consultation    Referring Provider (PT)  [name], MD    Onset Date/Surgical Date  08/05/19    Hand Dominance  Right      Berg Balance Test   Sit to Stand  Able to stand  independently using hands    Standing Unsupported  Able to stand 2 minutes with supervision    Sitting with Back Unsupported but Feet Supported on Floor or Stool  Able to sit safely and securely 2 minutes    Stand to Sit  Controls descent by using hands    Transfers  Able to transfer safely, definite need of hands    Standing Unsupported with Eyes Closed  Needs help to keep from falling    Standing Unsupported with Feet Together  Needs help to attain position and unable to hold for 15 seconds    From Standing, Reach Forward with Outstretched Arm  Can reach forward >12 cm safely (5")    From Standing Position, Pick up Object from Floor  Able to pick up shoe, needs supervision    From Standing Position, Turn to Look  Behind Over each Shoulder  Turn sideways only but maintains balance    Turn 360 Degrees  Needs assistance while turning   LOB, minA to steady   Standing Unsupported, Alternately Place Feet on Step/Stool  Able to complete 4 steps without aid or supervision    Standing Unsupported, One Foot in Front  Needs help to step but can hold 15 seconds  Standing on One Leg  Unable to try or needs assist to prevent fall    Total Score  27    Berg comment:  27/56      [facility] Adult PT Treatment/Exercise - 08/31/19 0001      Ambulation/Gait   Ambulation/Gait  Yes    Ambulation/Gait Assistance  6: Modified independent (Device/Increase time);4: Min guard    Ambulation Distance (Feet)  85 Feet    Assistive device  Straight cane    Gait Pattern  Step-through pattern;Antalgic;Decreased stance time - right;Poor foot clearance - left;Poor foot clearance - right    Gait Comments  patient catches her feet during gait and needs min A to recover.;  short standing rest break after 50 ft to allow spO2 to increase.       Knee/Hip Exercises: Stretches   Passive Hamstring Stretch  Right;Left;2 reps;20 seconds   seated     Knee/Hip Exercises: Aerobic   Nustep  L3: 6 min       Balance Exercises - 08/31/19 1133      OTAGO PROGRAM   Head Movements  Standing;5 reps    Neck Movements  Standing;5 reps    Trunk Movements  Standing;5 reps    Knee Flexor  5 reps    Hip ABductor  --   unable to complete    Overall OTAGO Comments  standing marching x 5 reps each leg       Balance Exercises: Seated   Heel Raises  Both;10 reps    Toe Raise  10 reps;Both          PT Short Term Goals - 08/31/19 1142      PT SHORT TERM GOAL #1   Title  The patient will be indep with initial HEP.    Time  4    Period  Weeks    Status  On-going    Target Date  09/12/19      PT SHORT TERM GOAL #2   Title  The patient will ambulate with RW mod indep x 150 feet.    Time  6    Period  Weeks    Status  On-going    Target Date   09/12/19      PT SHORT TERM GOAL #3   Title  The patient will improve 5 time sit to stand to < or equal to 35 seconds.    Baseline  30.1 second (08/27/19)    Time  6    Period  Weeks    Status  Achieved    Target Date  09/12/19      PT SHORT TERM GOAL #4   Title  The patient will improve Berg score from 15/56 to > or equal to 25/56 to demonstrate dec'ing risk for falls.    Time  6    Period  Weeks    Status  Achieved    Target Date  09/12/19        PT Long Term Goals - 08/27/19 1303      PT LONG TERM GOAL #1   Title  The patient will be indep with HEP for LE strengthening, balance, and flexibility.    Time  8    Period  Weeks    Status  On-going      PT LONG TERM GOAL #2   Title  The patient will reduce 5 time sit<>stand to < or equal to 30 seconds to demonstrate improving LE strength.    Time  8  Period  Weeks    Status  On-going      PT LONG TERM GOAL #3   Title  The patient will imrpove Berg from 15/56 to > or equal to 30/56 to demonstrate improved steady state standing without UE Support.    Time  8    Period  Weeks      PT LONG TERM GOAL #4   Title  The patient will tolerate walking x 3 minutes with use of RW and oxygen to demonstrate improving mobility for short community distances.    Time  8    Period  Weeks    Status  On-going      PT LONG TERM GOAL #5   Title  The patient will improve gait speed to > or equal to 1.8 ft/sec to demonstrate dec'd risk for fall.    Time  8    Period  Weeks    Status  On-going            Plan - 08/31/19 1140    Clinical Impression Statement  Pt's BERG score has improved to 24/56; has met STG#4.Continues with limited standing static and dynamic balance, with narrow base of support or single leg stance.  Her spO2 decreased with standing balance exercises to 83%; requiring short seated rest breaks, improving to 93%.  Pt tolerated exercises well without increase in knee pain.    Personal Factors and Comorbidities   Comorbidity 1;Comorbidity 3+;Comorbidity 2;Other    Comorbidities  frequent falls, COPD, RA, recent covid infection (1/21), dec'd safety awareness    Examination-Activity Limitations  Locomotion Level;Squat;Stairs;Stand    Examination-Participation Restrictions  Community Activity;Shop    Stability/Clinical Decision Making  Unstable/Unpredictable    Rehab Potential  Good    PT Frequency  2x / week    PT Duration  8 weeks    PT Treatment/Interventions  ADLs/Self Care Home Management;Gait training;Stair training;Functional mobility training;Therapeutic activities;Therapeutic exercise;Balance training;Neuromuscular re-education;Patient/family education;Vestibular;Manual techniques;DME Instruction;Moist Heat;Passive range of motion    PT Next Visit Plan  Try clinic walker, check spO2, monitor during session (patient should bring home O2), [place] balance HEP near countertop to initiate strengthening, safety with ambulation (encourage walker use-- may need to work on low back positioning/stretching to tolerate walker), standing balance.    PT Home Exercise Plan  Access Code: [email or web address]    Consulted and Agree with Plan of Care  Patient       Patient will benefit from skilled therapeutic intervention in order to improve the following deficits and impairments:  Abnormal gait, Difficulty walking, Decreased strength, Postural dysfunction, Impaired flexibility, Hypomobility, Pain, Decreased activity tolerance, Decreased balance  Visit Diagnosis: Other abnormalities of gait and mobility  Unsteadiness on feet  Muscle weakness (generalized)  History of falling     Problem List Patient Active Problem List   Diagnosis Date Noted  . Diarrhea 07/16/2019  . AKI (acute kidney injury) ([place]) 07/16/2019  . Falls frequently 07/16/2019  . COVID-19 virus infection 05/16/2019  . Community acquired pneumonia   . Acute on chronic respiratory failure with hypoxia ([place]) 06/08/2018  . Acute respiratory failure  with hypercapnia ([place])   . COPD exacerbation ([place]) 07/29/2017  . Rheumatoid arthritis ([place]) 07/29/2017  . Neuropathy 07/29/2017  . Acute respiratory failure with hypoxia ([place]) 07/29/2017  . Anemia 05/20/2016  . Hyperglycemia 05/20/2016  . Shoulder impingement syndrome 03/25/2015  . Chronic respiratory failure ([place]) 04/10/2012  . Constipation 07/03/2011  . COPD with acute exacerbation ([place]) 06/29/2011  . Stage 3 severe  COPD by GOLD classification ([place]) 06/19/2007  . HLD (hyperlipidemia) 05/15/2007  . ACID REFLUX DISEASE 05/15/2007   Kerin Perna, PTA 08/31/19 1:24 PM  [place], [place], [address] Phone: [phone]   Fax:  [phone]  Name: [name] MRN: [id number] Date of Birth: Apr 17, 1936

## 2019-09-03 ENCOUNTER — Other Ambulatory Visit: Payer: Self-pay

## 2019-09-03 ENCOUNTER — Ambulatory Visit (INDEPENDENT_AMBULATORY_CARE_PROVIDER_SITE_OTHER): Payer: Medicare Other | Admitting: Rehabilitative and Restorative Service Providers"

## 2019-09-03 DIAGNOSIS — Z9181 History of falling: Secondary | ICD-10-CM

## 2019-09-03 DIAGNOSIS — R2689 Other abnormalities of gait and mobility: Secondary | ICD-10-CM

## 2019-09-03 DIAGNOSIS — R42 Dizziness and giddiness: Secondary | ICD-10-CM

## 2019-09-03 DIAGNOSIS — R2681 Unsteadiness on feet: Secondary | ICD-10-CM

## 2019-09-03 DIAGNOSIS — M6281 Muscle weakness (generalized): Secondary | ICD-10-CM | POA: Diagnosis not present

## 2019-09-03 NOTE — Therapy (Signed)
[place], [place], [address] Phone: [phone]   Fax:  [phone]  Physical Therapy Treatment  Patient Details  Name: [name] MRN: [id number] Date of Birth: 19-Feb-1936 Referring Provider (PT): [name], MD   Encounter Date: 09/03/2019  PT End of Session - 09/03/19 1157    Visit Number  7    Number of Visits  16    Date for PT Re-Evaluation  10/12/19    PT Start Time  [id number]    PT Stop Time  1230    PT Time Calculation (min)  42 min    Equipment Utilized During Treatment  Gait belt    Activity Tolerance  Treatment limited secondary to medical complications (Comment)    Behavior During Therapy  [facility] for tasks assessed/performed       Past Medical History:  Diagnosis Date  . ACID REFLUX DISEASE 05/15/2007   Qualifier: Diagnosis of  By: [name] MD, Christena Deem   . Acute and chronic respiratory failure with hypoxia ([place])   . Acute on chronic respiratory failure with hypoxia ([place]) 06/08/2018  . Acute respiratory failure with hypercapnia ([place])   . Acute respiratory failure with hypoxia ([place]) 07/29/2017  . Anemia 05/20/2016  . Arthritis   . Bladder neoplasm   . Body mass index 34.0-34.9, adult   . Bronchitis 07/06/2011  . Chest pain 07/06/2011  . Chronic cough   . Chronic respiratory failure ([place])   . Community acquired pneumonia   . Constipation 07/03/2011  . COPD (chronic obstructive pulmonary disease) (HCC)    PULMOLOGIST-  DR [name]--  GOLD III W/  CHRONIC RESPIRATORY FAILURE--  O2 DEPENDENT  . COPD with acute exacerbation ([place]) 06/29/2011  . Coronary arteriosclerosis   . Cough productive of purulent sputum   . Depression   . Dyslipidemia   . Falls frequently   . Fibromyalgia   . GERD (gastroesophageal reflux disease)   . Hearing loss in right ear   . Hematuria   . History of nephritis    as child dx w/ Bright's disease (glomerulonephritis)  . HLD (hyperlipidemia) 05/15/2007   Qualifier: Diagnosis of  By: [name] MD, Christena Deem   . Hyperglycemia 05/20/2016  . Hyperlipidemia   . Hypomagnesemia   . Hypoxemia   . Iron deficiency anemia, unspecified   . Leukopenia   . Low vitamin D level   . Mild obstructive sleep apnea    no cpap recommendation  . Neuropathy 07/29/2017  . Neuropathy, peripheral, idiopathic   . Osteopenia   . Other allergic rhinitis   . Pelvic pain in female   . Polyarthralgia   . PONV (postoperative nausea and vomiting)   . Pre-diabetes   . Rheumatoid arthritis ([place]) 07/29/2017  . Shoulder impingement syndrome 03/25/2015  . Sleep disorder, unspecified   . Spinal stenosis   . Stage 3 severe COPD by GOLD classification ([place]) 06/19/2007   Followed in Pulmonary clinic/ [facility]/ [place]  - hfa 90% 05/15/2012  - PFTs 05/15/2012 FEV1  0.61 ( 36%) ratio 57 and 21% better p B2 and DLCO 44%  106 corrected  - Referred to reahab 05/15/12 - Rec pulmonary f/u prn 02/11/13    . Supplemental oxygen dependent    2L  via Nasal Canula --  activity and nighttime  . Urinary incontinence in female     Past Surgical History:  Procedure Laterality Date  . APPENDECTOMY  1971  . CARDIAC CATHETERIZATION  05-08-2007  dr  skains   minor coronary plaquing w/ no significant CAD/  20% mLAD,  10% mRCA,  perserved LV, ef 60-65%  . CATARACT EXTRACTION W/ INTRAOCULAR LENS  IMPLANT, BILATERAL  2001 approx  . CHOLECYSTECTOMY  1988  . CYSTOSCOPY W/ RETROGRADES Bilateral 09/24/2014   Procedure: CYSTOSCOPY WITH RETROGRADE PYELOGRAM;  Surgeon: Festus Aloe, MD;  [facility];  Service: Urology;  Laterality: Bilateral;  . CYSTOSCOPY WITH BIOPSY N/A 09/24/2014   Procedure: CYSTOSCOPY WITH BIOPSY;  Surgeon: Festus Aloe, MD;  Location: [facility];  Service: Urology;  Laterality: N/A;  . ESOPHAGOGASTRODUODENOSCOPY  last one 09-09-2008  . FULGURATION OF BLADDER TUMOR N/A 09/24/2014   Procedure: FULGURATION OF BLADDER TUMOR;  Surgeon: Festus Aloe, MD;  Location: [facility];  Service: Urology;  Laterality: N/A;  . [place]  . POSTERIOR LUMBAR FUSION  10-07-2007   bilateral laminectomy L4 and bilateral L4-5 diskectomy w/ fusion  . TONSILLECTOMY  as child  . TOTAL ABDOMINAL HYSTERECTOMY W/ BILATERAL SALPINGOOPHORECTOMY  1973  . TRANSTHORACIC ECHOCARDIOGRAM  07-07-2011   normal echo,  ef 65-70%    There were no vitals filed for this visit.  Subjective Assessment - 09/03/19 1154    Subjective  The patient injured her left wrist last night lifting her bible.  She has spO2 down to 83% upon walking into the clinic that improves with 2 minutes of reest to 90%.    Pertinent History  COPD, RA, GERD, recent hospitalizations (covid in 1/21).    Patient Stated Goals  Reduce falls    Currently in Pain?  Yes    Pain Score  7     Pain Location  Knee    Pain Orientation  Right    Pain Descriptors / Indicators  Aching    Pain Type  Chronic pain    Pain Onset  More than a month ago    Pain Frequency  Intermittent    Aggravating Factors   walking    Pain Relieving Factors  rest                        [facility] Adult PT Treatment/Exercise - 09/03/19 1203      Ambulation/Gait   Ambulation/Gait  Yes    Ambulation/Gait Assistance  6: Modified independent (Device/Increase time)    Ambulation Distance (Feet)  150 Feet    Assistive device  Rolling walker    Gait Comments  patient's low back is not painful with ambulation with walker-- she has noted this is a reason she avoids using RW.  Her walker is a rollator with seat.  We discussed bringing hers into therapy for [place] to assess height, and determine if there is a way to use seat to support oxygen tank safely      Self-Care   Self-Care  Other Self-Care Comments    Other Self-Care Comments   Patient's oxygen is low today dropping to 79% after 75 ft of walking, PT and patient discussed barriers to carrying her oxygen.  She notes the weight of the  oxygen tank is too heavy to carry.  We discussed use of walker for oxygen tank.  PT to continue to work with patient on this in order to promote improved mobility and increase safety.       Exercises   Exercises  Knee/Hip      Knee/Hip Exercises: Aerobic   Nustep  L3 x 5 minutes  Knee/Hip Exercises: Supine   Hip Adduction Isometric  Strengthening;Both;5 reps    Bridges  Both;Strengthening;10 reps   8 reps   Straight Leg Raises  Right;10 reps    Other Supine Knee/Hip Exercises  spO2=86% with ther ex supine    Other Supine Knee/Hip Exercises  Supine marching 10 reps, lumbar rocking x 5 reps, and then 10 more supine marches x 10               PT Short Term Goals - 08/31/19 1142      PT SHORT TERM GOAL #1   Title  The patient will be indep with initial HEP.    Time  4    Period  Weeks    Status  On-going    Target Date  09/12/19      PT SHORT TERM GOAL #2   Title  The patient will ambulate with RW mod indep x 150 feet.    Time  6    Period  Weeks    Status  On-going    Target Date  09/12/19      PT SHORT TERM GOAL #3   Title  The patient will improve 5 time sit to stand to < or equal to 35 seconds.    Baseline  30.1 second (08/27/19)    Time  6    Period  Weeks    Status  Achieved    Target Date  09/12/19      PT SHORT TERM GOAL #4   Title  The patient will improve Berg score from 15/56 to > or equal to 25/56 to demonstrate dec'ing risk for falls.    Time  6    Period  Weeks    Status  Achieved    Target Date  09/12/19        PT Long Term Goals - 09/03/19 1157      PT LONG TERM GOAL #1   Title  The patient will be indep with HEP for LE strengthening, balance, and flexibility.    Time  8    Period  Weeks    Status  On-going      PT LONG TERM GOAL #2   Title  The patient will reduce 5 time sit<>stand to < or equal to 30 seconds to demonstrate improving LE strength.    Time  8    Period  Weeks    Status  On-going      PT LONG TERM GOAL #3   Title   The patient will imrpove Berg from 15/56 to > or equal to 30/56 to demonstrate improved steady state standing without UE Support.    Time  8    Period  Weeks      PT LONG TERM GOAL #4   Title  The patient will tolerate walking x 3 minutes with use of RW and oxygen to demonstrate improving mobility for short community distances.    Time  8    Period  Weeks    Status  On-going      PT LONG TERM GOAL #5   Title  The patient will improve gait speed to > or equal to 1.8 ft/sec to demonstrate dec'd risk for fall.    Time  8    Period  Weeks    Status  On-going            Plan - 09/03/19 2055    Clinical Impression Statement  The patient's spO2 was dec'ing today  on room air.  PT recommended she bring her rollator RW and oxygen into therapy. The patient reports dec'ing falls since beginning therapy.  She reports compliance with HEP.    Stability/Clinical Decision Making  --    Rehab Potential  Good    PT Frequency  2x / week    PT Duration  8 weeks    PT Treatment/Interventions  ADLs/Self Care Home Management;Gait training;Stair training;Functional mobility training;Therapeutic activities;Therapeutic exercise;Balance training;Neuromuscular re-education;Patient/family education;Vestibular;Manual techniques;DME Instruction;Moist Heat;Passive range of motion    PT Next Visit Plan  Did patient bring rollator?  check spO2, monitor during session (patient should bring home O2), [place] balance HEP near countertop to initiate strengthening, safety with ambulation (encourage walker use-- may need to work on low back positioning/stretching to tolerate walker), standing balance.    PT Home Exercise Plan  Access Code: [email or web address]    Consulted and Agree with Plan of Care  Patient       Patient will benefit from skilled therapeutic intervention in order to improve the following deficits and impairments:  Abnormal gait, Difficulty walking, Decreased strength, Postural dysfunction, Impaired flexibility,  Hypomobility, Pain, Decreased activity tolerance, Decreased balance  Visit Diagnosis: Other abnormalities of gait and mobility  Unsteadiness on feet  Muscle weakness (generalized)  History of falling  Dizziness and giddiness     Problem List Patient Active Problem List   Diagnosis Date Noted  . Diarrhea 07/16/2019  . AKI (acute kidney injury) ([place]) 07/16/2019  . Falls frequently 07/16/2019  . COVID-19 virus infection 05/16/2019  . Community acquired pneumonia   . Acute on chronic respiratory failure with hypoxia ([place]) 06/08/2018  . Acute respiratory failure with hypercapnia ([place])   . COPD exacerbation ([place]) 07/29/2017  . Rheumatoid arthritis ([place]) 07/29/2017  . Neuropathy 07/29/2017  . Acute respiratory failure with hypoxia ([place]) 07/29/2017  . Anemia 05/20/2016  . Hyperglycemia 05/20/2016  . Shoulder impingement syndrome 03/25/2015  . Chronic respiratory failure ([place]) 04/10/2012  . Constipation 07/03/2011  . COPD with acute exacerbation ([place]) 06/29/2011  . Stage 3 severe COPD by GOLD classification ([place]) 06/19/2007  . HLD (hyperlipidemia) 05/15/2007  . ACID REFLUX DISEASE 05/15/2007    Verta Riedlinger , PT 09/03/2019, 9:00 PM  [facility] [place], [place], [address] Phone: [phone]   Fax:  [phone]  Name: [name] MRN: [id number] Date of Birth: [birth year]

## 2019-09-04 DIAGNOSIS — M25561 Pain in right knee: Secondary | ICD-10-CM | POA: Diagnosis not present

## 2019-09-04 DIAGNOSIS — M179 Osteoarthritis of knee, unspecified: Secondary | ICD-10-CM | POA: Diagnosis not present

## 2019-09-07 ENCOUNTER — Encounter: Payer: Self-pay | Admitting: Rehabilitative and Restorative Service Providers"

## 2019-09-07 ENCOUNTER — Other Ambulatory Visit: Payer: Self-pay

## 2019-09-07 ENCOUNTER — Ambulatory Visit (INDEPENDENT_AMBULATORY_CARE_PROVIDER_SITE_OTHER): Payer: Medicare Other | Admitting: Rehabilitative and Restorative Service Providers"

## 2019-09-07 DIAGNOSIS — M6281 Muscle weakness (generalized): Secondary | ICD-10-CM | POA: Diagnosis not present

## 2019-09-07 DIAGNOSIS — R42 Dizziness and giddiness: Secondary | ICD-10-CM

## 2019-09-07 DIAGNOSIS — R2689 Other abnormalities of gait and mobility: Secondary | ICD-10-CM

## 2019-09-07 DIAGNOSIS — R2681 Unsteadiness on feet: Secondary | ICD-10-CM | POA: Diagnosis not present

## 2019-09-07 DIAGNOSIS — Z9181 History of falling: Secondary | ICD-10-CM | POA: Diagnosis not present

## 2019-09-07 NOTE — Therapy (Signed)
[place], [place], [address] Phone: [phone]   Fax:  [phone]  Physical Therapy Treatment  Patient Details  Name: [name] MRN: [id number] Date of Birth: 1935/12/07 Referring Provider (PT): [name], MD   Encounter Date: 09/07/2019  PT End of Session - 09/07/19 1156    Visit Number  8    Number of Visits  16    Date for PT Re-Evaluation  10/12/19    PT Start Time  1017    PT Stop Time  1230    PT Time Calculation (min)  42 min    Equipment Utilized During Treatment  Gait belt    Activity Tolerance  Treatment limited secondary to medical complications (Comment)    Behavior During Therapy  [facility] for tasks assessed/performed       Past Medical History:  Diagnosis Date  . ACID REFLUX DISEASE 05/15/2007   Qualifier: Diagnosis of  By: [name] MD, Christena Deem   . Acute and chronic respiratory failure with hypoxia ([place])   . Acute on chronic respiratory failure with hypoxia ([place]) 06/08/2018  . Acute respiratory failure with hypercapnia ([place])   . Acute respiratory failure with hypoxia ([place]) 07/29/2017  . Anemia 05/20/2016  . Arthritis   . Bladder neoplasm   . Body mass index 34.0-34.9, adult   . Bronchitis 07/06/2011  . Chest pain 07/06/2011  . Chronic cough   . Chronic respiratory failure ([place])   . Community acquired pneumonia   . Constipation 07/03/2011  . COPD (chronic obstructive pulmonary disease) (HCC)    PULMOLOGIST-  DR [name]--  GOLD III W/  CHRONIC RESPIRATORY FAILURE--  O2 DEPENDENT  . COPD with acute exacerbation ([place]) 06/29/2011  . Coronary arteriosclerosis   . Cough productive of purulent sputum   . Depression   . Dyslipidemia   . Falls frequently   . Fibromyalgia   . GERD (gastroesophageal reflux disease)   . Hearing loss in right ear   . Hematuria   . History of nephritis    as child dx w/ Bright's disease (glomerulonephritis)  . HLD (hyperlipidemia) 05/15/2007   Qualifier: Diagnosis of  By: [name] MD, Christena Deem   . Hyperglycemia 05/20/2016  . Hyperlipidemia   . Hypomagnesemia   . Hypoxemia   . Iron deficiency anemia, unspecified   . Leukopenia   . Low vitamin D level   . Mild obstructive sleep apnea    no cpap recommendation  . Neuropathy 07/29/2017  . Neuropathy, peripheral, idiopathic   . Osteopenia   . Other allergic rhinitis   . Pelvic pain in female   . Polyarthralgia   . PONV (postoperative nausea and vomiting)   . Pre-diabetes   . Rheumatoid arthritis ([place]) 07/29/2017  . Shoulder impingement syndrome 03/25/2015  . Sleep disorder, unspecified   . Spinal stenosis   . Stage 3 severe COPD by GOLD classification ([place]) 06/19/2007   Followed in Pulmonary clinic/ [facility]/ [facility]  - hfa 90% 05/15/2012  - PFTs 05/15/2012 FEV1  0.61 ( 36%) ratio 57 and 21% better p B2 and DLCO 44%  106 corrected  - Referred to reahab 05/15/12 - Rec pulmonary f/u prn 02/11/13    . Supplemental oxygen dependent    2L  via Nasal Canula --  activity and nighttime  . Urinary incontinence in female     Past Surgical History:  Procedure Laterality Date  . APPENDECTOMY  1971  . CARDIAC CATHETERIZATION  05-08-2007  dr  skains   minor coronary plaquing w/ no significant CAD/  20% mLAD,  10% mRCA,  perserved LV, ef 60-65%  . CATARACT EXTRACTION W/ INTRAOCULAR LENS  IMPLANT, BILATERAL  2001 approx  . CHOLECYSTECTOMY  1988  . CYSTOSCOPY W/ RETROGRADES Bilateral 09/24/2014   Procedure: CYSTOSCOPY WITH RETROGRADE PYELOGRAM;  Surgeon: Festus Aloe, MD;  Location: [facility];  Service: Urology;  Laterality: Bilateral;  . CYSTOSCOPY WITH BIOPSY N/A 09/24/2014   Procedure: CYSTOSCOPY WITH BIOPSY;  Surgeon: Festus Aloe, MD;  Location: [facility];  Service: Urology;  Laterality: N/A;  . ESOPHAGOGASTRODUODENOSCOPY  last one 09-09-2008  . FULGURATION OF BLADDER TUMOR N/A 09/24/2014   Procedure: FULGURATION OF BLADDER TUMOR;  Surgeon: Festus Aloe, MD;  Location: [facility];  Service: Urology;  Laterality: N/A;  . [place]  . POSTERIOR LUMBAR FUSION  10-07-2007   bilateral laminectomy L4 and bilateral L4-5 diskectomy w/ fusion  . TONSILLECTOMY  as child  . TOTAL ABDOMINAL HYSTERECTOMY W/ BILATERAL SALPINGOOPHORECTOMY  1973  . TRANSTHORACIC ECHOCARDIOGRAM  07-07-2011   normal echo,  ef 65-70%    There were no vitals filed for this visit.  Subjective Assessment - 09/07/19 1154    Subjective  The patient's wrist is feeling better.  She arrives today reporting her knee is in need of replacement, but she is unsure if she can handle that medically.  She saw Dr. Tonita Cong on Friday.  She reports no falls since last session-- one near fall.    Pertinent History  COPD, RA, GERD, recent hospitalizations (covid in 1/21).    Patient Stated Goals  Reduce falls    Currently in Pain?  Yes    Pain Location  Knee    Pain Orientation  Right    Pain Descriptors / Indicators  Sore    Pain Type  Chronic pain    Pain Onset  More than a month ago    Pain Frequency  Intermittent    Aggravating Factors   didn't sleep good because her knee hurt last night    Pain Relieving Factors  rest        [facility] Adult PT Treatment/Exercise - 09/07/19 1204      Ambulation/Gait   Ambulation/Gait  Yes    Ambulation/Gait Assistance  6: Modified independent (Device/Increase time)    Ambulation Distance (Feet)  150 Feet   x 3 reps   Assistive device  Rollator;Straight cane    Gait Pattern  Step-through pattern    Gait Comments  PT walked with patient with SPC to car to unload rollator RW and adjusted height.      Self-Care   Self-Care  Other Self-Care Comments    Other Self-Care Comments   Discussed height of rollator RW and how to carry O2 when using.      Exercises   Exercises  Knee/Hip      Knee/Hip Exercises: Aerobic   Nustep  L4 x 4 minutes    Other Aerobic  Sat and rested x 2 minutes to allow spO2 to increase from  83% up to 90%.      Knee/Hip Exercises: Supine   Straight Leg Raises  Right;10 reps;Left;Strengthening    Straight Leg Raises Limitations  Patient notes some low back pain when supine    Other Supine Knee/Hip Exercises  spO2%=91% with supine ther ex    Other Supine Knee/Hip Exercises  Supine marching 10 reps, lumbar rocking x 5 reps, and then 10 more  supine marches x 10; lumbar rocking/ trunk rotation x 5 reps        [facility] PT Assessment - 09/07/19 1204      Transfers   Transfers  Sit to Stand    Five time sit to stand comments   27.16  seconds improved from 41.09 seconds at eval      [employer] Test   Sit to Stand  Able to stand  independently using hands    Standing Unsupported  Able to stand 2 minutes with supervision    Sitting with Back Unsupported but Feet Supported on Floor or Stool  Able to sit safely and securely 2 minutes    Stand to Sit  Sits safely with minimal use of hands    Transfers  Able to transfer safely, definite need of hands    Standing Unsupported with Eyes Closed  Able to stand 3 seconds    Standing Unsupported with Feet Together  Needs help to attain position and unable to hold for 15 seconds    From Standing, Reach Forward with Outstretched Arm  Can reach forward >12 cm safely (5")    From Standing Position, Pick up Object from Floor  Able to pick up shoe, needs supervision    From Standing Position, Turn to Look Behind Over each Shoulder  Turn sideways only but maintains balance    Turn 360 Degrees  Needs assistance while turning    Standing Unsupported, Alternately Place Feet on Step/Stool  Able to complete 4 steps without aid or supervision    Standing Unsupported, One Foot in Front  Needs help to step but can hold 15 seconds    Standing on One Leg  Unable to try or needs assist to prevent fall    Total Score  30    Berg comment:  3056                        PT Short Term Goals - 09/07/19 1210      PT SHORT TERM GOAL #1   Title   The patient will be indep with initial HEP.    Baseline  Patient reports she is doing ther ex at home.    Time  4    Period  Weeks    Status  Achieved    Target Date  09/12/19      PT SHORT TERM GOAL #2   Title  The patient will ambulate with RW mod indep x 150 feet.    Time  6    Period  Weeks    Status  Achieved    Target Date  09/12/19      PT SHORT TERM GOAL #3   Title  The patient will improve 5 time sit to stand to < or equal to 35 seconds.    Baseline  30.1 second (08/27/19)    Time  6    Period  Weeks    Status  Achieved    Target Date  09/12/19      PT SHORT TERM GOAL #4   Title  The patient will improve Berg score from 15/56 to > or equal to 25/56 to demonstrate dec'ing risk for falls.    Time  6    Period  Weeks    Status  Achieved    Target Date  09/12/19        PT Long Term Goals - 09/07/19 1202      PT LONG TERM  GOAL #1   Title  The patient will be indep with HEP for LE strengthening, balance, and flexibility.    Time  8    Period  Weeks    Status  On-going      PT LONG TERM GOAL #2   Title  The patient will reduce 5 time sit<>stand to < or equal to 30 seconds to demonstrate improving LE strength.    Baseline  27.16 seconds improved from 41.09 seconds.    Time  8    Period  Weeks    Status  Achieved      PT LONG TERM GOAL #3   Title  The patient will imrpove Berg from 15/56 to > or equal to 30/56 to demonstrate improved steady state standing without UE Support.    Baseline  improved from 15 up to 30/56.    Time  8    Period  Weeks    Status  Achieved      PT LONG TERM GOAL #4   Title  The patient will tolerate walking x 3 minutes with use of RW and oxygen to demonstrate improving mobility for short community distances.    Time  8    Period  Weeks    Status  On-going      PT LONG TERM GOAL #5   Title  The patient will improve gait speed to > or equal to 1.8 ft/sec to demonstrate dec'd risk for fall.    Time  8    Period  Weeks    Status   On-going            Plan - 09/07/19 1241    Clinical Impression Statement  The patient has met all STGs and is working towards [place].  The patient's spO2 dropped today on room air.  PT continues to recommend use of O2 (as prescribed) when in the community.  We modified height of rollator RW and will continue to work on improving confidence with device.    Rehab Potential  Good    PT Frequency  2x / week    PT Duration  8 weeks    PT Treatment/Interventions  ADLs/Self Care Home Management;Gait training;Stair training;Functional mobility training;Therapeutic activities;Therapeutic exercise;Balance training;Neuromuscular re-education;Patient/family education;Vestibular;Manual techniques;DME Instruction;Moist Heat;Passive range of motion    PT Next Visit Plan  check on rollator + oxygen use (try to configure in a way she can manage it in community);  check spO2, monitor during session (patient should bring home O2), [place] balance HEP near countertop to initiate strengthening, safety with ambulation (encourage walker use-- may need to work on low back positioning/stretching to tolerate walker), standing balance.    PT Home Exercise Plan  Access Code: [email or web address]    Consulted and Agree with Plan of Care  Patient       Patient will benefit from skilled therapeutic intervention in order to improve the following deficits and impairments:  Abnormal gait, Difficulty walking, Decreased strength, Postural dysfunction, Impaired flexibility, Hypomobility, Pain, Decreased activity tolerance, Decreased balance  Visit Diagnosis: No diagnosis found.     Problem List Patient Active Problem List   Diagnosis Date Noted  . Diarrhea 07/16/2019  . AKI (acute kidney injury) ([place]) 07/16/2019  . Falls frequently 07/16/2019  . COVID-19 virus infection 05/16/2019  . Community acquired pneumonia   . Acute on chronic respiratory failure with hypoxia ([place]) 06/08/2018  . Acute respiratory failure with hypercapnia  ([place])   . COPD exacerbation ([place]) 07/29/2017  . Rheumatoid arthritis (  [place]) 07/29/2017  . Neuropathy 07/29/2017  . Acute respiratory failure with hypoxia ([place]) 07/29/2017  . Anemia 05/20/2016  . Hyperglycemia 05/20/2016  . Shoulder impingement syndrome 03/25/2015  . Chronic respiratory failure ([place]) 04/10/2012  . Constipation 07/03/2011  . COPD with acute exacerbation ([place]) 06/29/2011  . Stage 3 severe COPD by GOLD classification ([place]) 06/19/2007  . HLD (hyperlipidemia) 05/15/2007  . ACID REFLUX DISEASE 05/15/2007    Chantille Navarrete 09/07/2019, 12:46 PM  [facility] [place], [place], [address] Phone: [phone]   Fax:  [phone]  Name: [name] MRN: [id number] Date of Birth: 1936-02-14

## 2019-09-10 ENCOUNTER — Ambulatory Visit (INDEPENDENT_AMBULATORY_CARE_PROVIDER_SITE_OTHER): Payer: Medicare Other | Admitting: Physical Therapy

## 2019-09-10 ENCOUNTER — Encounter: Payer: Self-pay | Admitting: Physical Therapy

## 2019-09-10 ENCOUNTER — Other Ambulatory Visit: Payer: Self-pay

## 2019-09-10 DIAGNOSIS — M6281 Muscle weakness (generalized): Secondary | ICD-10-CM | POA: Diagnosis not present

## 2019-09-10 DIAGNOSIS — R2681 Unsteadiness on feet: Secondary | ICD-10-CM | POA: Diagnosis not present

## 2019-09-10 DIAGNOSIS — R2689 Other abnormalities of gait and mobility: Secondary | ICD-10-CM | POA: Diagnosis not present

## 2019-09-10 DIAGNOSIS — Z9181 History of falling: Secondary | ICD-10-CM

## 2019-09-10 NOTE — Therapy (Signed)
[place], [place], [address] Phone: [phone]   Fax:  [phone]  Physical Therapy Treatment  Patient Details  Name: [name] MRN: [id number] Date of Birth: 1935-08-09 Referring Provider (PT): [name], MD   Encounter Date: 09/10/2019  PT End of Session - 09/10/19 1156    Visit Number  9    Number of Visits  16    Date for PT Re-Evaluation  10/12/19    PT Start Time  [id number]    PT Stop Time  1230    PT Time Calculation (min)  41 min    Equipment Utilized During Treatment  Gait belt    Activity Tolerance  --    Behavior During Therapy  WFL for tasks assessed/performed       Past Medical History:  Diagnosis Date  . ACID REFLUX DISEASE 05/15/2007   Qualifier: Diagnosis of  By: [name] MD, Christena Deem   . Acute and chronic respiratory failure with hypoxia ([place])   . Acute on chronic respiratory failure with hypoxia ([place]) 06/08/2018  . Acute respiratory failure with hypercapnia ([place])   . Acute respiratory failure with hypoxia ([place]) 07/29/2017  . Anemia 05/20/2016  . Arthritis   . Bladder neoplasm   . Body mass index 34.0-34.9, adult   . Bronchitis 07/06/2011  . Chest pain 07/06/2011  . Chronic cough   . Chronic respiratory failure ([place])   . Community acquired pneumonia   . Constipation 07/03/2011  . COPD (chronic obstructive pulmonary disease) (HCC)    PULMOLOGIST-  DR [name]--  GOLD III W/  CHRONIC RESPIRATORY FAILURE--  O2 DEPENDENT  . COPD with acute exacerbation ([place]) 06/29/2011  . Coronary arteriosclerosis   . Cough productive of purulent sputum   . Depression   . Dyslipidemia   . Falls frequently   . Fibromyalgia   . GERD (gastroesophageal reflux disease)   . Hearing loss in right ear   . Hematuria   . History of nephritis    as child dx w/ Bright's disease (glomerulonephritis)  . HLD (hyperlipidemia) 05/15/2007   Qualifier: Diagnosis of  By: [name] MD, Christena Deem   . Hyperglycemia  05/20/2016  . Hyperlipidemia   . Hypomagnesemia   . Hypoxemia   . Iron deficiency anemia, unspecified   . Leukopenia   . Low vitamin D level   . Mild obstructive sleep apnea    no cpap recommendation  . Neuropathy 07/29/2017  . Neuropathy, peripheral, idiopathic   . Osteopenia   . Other allergic rhinitis   . Pelvic pain in female   . Polyarthralgia   . PONV (postoperative nausea and vomiting)   . Pre-diabetes   . Rheumatoid arthritis ([place]) 07/29/2017  . Shoulder impingement syndrome 03/25/2015  . Sleep disorder, unspecified   . Spinal stenosis   . Stage 3 severe COPD by GOLD classification ([place]) 06/19/2007   Followed in Pulmonary clinic/ [facility]/ [facility]  - hfa 90% 05/15/2012  - PFTs 05/15/2012 FEV1  0.61 ( 36%) ratio 57 and 21% better p B2 and DLCO 44%  106 corrected  - Referred to reahab 05/15/12 - Rec pulmonary f/u prn 02/11/13    . Supplemental oxygen dependent    2L  via Nasal Canula --  activity and nighttime  . Urinary incontinence in female     Past Surgical History:  Procedure Laterality Date  . APPENDECTOMY  1971  . CARDIAC CATHETERIZATION  05-08-2007  dr Marlou Porch   minor coronary  plaquing w/ no significant CAD/  20% mLAD,  10% mRCA,  perserved LV, ef 60-65%  . CATARACT EXTRACTION W/ INTRAOCULAR LENS  IMPLANT, BILATERAL  2001 approx  . CHOLECYSTECTOMY  1988  . CYSTOSCOPY W/ RETROGRADES Bilateral 09/24/2014   Procedure: CYSTOSCOPY WITH RETROGRADE PYELOGRAM;  Surgeon: Festus Aloe, MD;  Location: [facility];  Service: Urology;  Laterality: Bilateral;  . CYSTOSCOPY WITH BIOPSY N/A 09/24/2014   Procedure: CYSTOSCOPY WITH BIOPSY;  Surgeon: Festus Aloe, MD;  Location: [facility];  Service: Urology;  Laterality: N/A;  . ESOPHAGOGASTRODUODENOSCOPY  last one 09-09-2008  . FULGURATION OF BLADDER TUMOR N/A 09/24/2014   Procedure: FULGURATION OF BLADDER TUMOR;  Surgeon: Festus Aloe, MD;  Location: [facility];  Service:  Urology;  Laterality: N/A;  . [place]  . POSTERIOR LUMBAR FUSION  10-07-2007   bilateral laminectomy L4 and bilateral L4-5 diskectomy w/ fusion  . TONSILLECTOMY  as child  . TOTAL ABDOMINAL HYSTERECTOMY W/ BILATERAL SALPINGOOPHORECTOMY  1973  . TRANSTHORACIC ECHOCARDIOGRAM  07-07-2011   normal echo,  ef 65-70%    There were no vitals filed for this visit.  Subjective Assessment - 09/10/19 1157    Subjective  Pt reports her portable oxygen tank is in her car that is in shop being repaired. She states she has a visit with her primary care physician tomorrow and will inquire if she can have knee replaced.  She has not had any more falls since starting therapy.    Pertinent History  COPD, RA, GERD, recent hospitalizations (covid in 1/21).    Patient Stated Goals  Reduce falls    Currently in Pain?  Yes    Pain Score  6     Pain Location  Knee    Pain Orientation  Right    Pain Descriptors / Indicators  Sore    Pain Onset  More than a month ago    Aggravating Factors   everything    Pain Relieving Factors  rest         [facility] PT Assessment - 09/10/19 0001      Assessment   Medical Diagnosis  Frequent falls, R hip/knee pain, walker consultation    Referring Provider (PT)  [name], MD    Onset Date/Surgical Date  08/05/19    Hand Dominance  Right       Balance Exercises - 09/10/19 1212      Balance Exercises: Standing   Tandem Stance  Intermittent upper extremity support;2 reps;15 secs   semi-tandem, CGA-min A   Tandem Gait  2 reps;Forward;Intermittent upper extremity support    Retro Gait  2 reps   with/without rollator   Sidestepping  1 rep    Marching  Solid surface;Dynamic;Forwards   holding rollator, O2 to 85%.    Other Standing Exercises  mini squats with UE on rollator    Other Standing Exercises Comments  toe taps to 3" object on floor x 10 reps (no UE support ) - O2 dropped to 78%, quickly returned to 88% within less than a min of seated rest.    Standing reaching across body just outside base of support to object at counter height, with tactile cues to encourage weight shift (SBA) x 10 reps, 2 sets.       OTAGO PROGRAM   Neck Movements  Standing;5 reps    Knee Flexor  10 reps     NuStep L4: 7 min, with spO2 monitored.  PT Short Term Goals - 09/07/19 1210      PT SHORT TERM GOAL #1   Title  The patient will be indep with initial HEP.    Baseline  Patient reports she is doing ther ex at home.    Time  4    Period  Weeks    Status  Achieved    Target Date  09/12/19      PT SHORT TERM GOAL #2   Title  The patient will ambulate with RW mod indep x 150 feet.    Time  6    Period  Weeks    Status  Achieved    Target Date  09/12/19      PT SHORT TERM GOAL #3   Title  The patient will improve 5 time sit to stand to < or equal to 35 seconds.    Baseline  30.1 second (08/27/19)    Time  6    Period  Weeks    Status  Achieved    Target Date  09/12/19      PT SHORT TERM GOAL #4   Title  The patient will improve Berg score from 15/56 to > or equal to 25/56 to demonstrate dec'ing risk for falls.    Time  6    Period  Weeks    Status  Achieved    Target Date  09/12/19        PT Long Term Goals - 09/07/19 1202      PT LONG TERM GOAL #1   Title  The patient will be indep with HEP for LE strengthening, balance, and flexibility.    Time  8    Period  Weeks    Status  On-going      PT LONG TERM GOAL #2   Title  The patient will reduce 5 time sit<>stand to < or equal to 30 seconds to demonstrate improving LE strength.    Baseline  27.16 seconds improved from 41.09 seconds.    Time  8    Period  Weeks    Status  Achieved      PT LONG TERM GOAL #3   Title  The patient will imrpove Berg from 15/56 to > or equal to 30/56 to demonstrate improved steady state standing without UE Support.    Baseline  improved from 15 up to 30/56.    Time  8    Period  Weeks    Status  Achieved      PT LONG TERM GOAL #4   Title   The patient will tolerate walking x 3 minutes with use of RW and oxygen to demonstrate improving mobility for short community distances.    Time  8    Period  Weeks    Status  On-going      PT LONG TERM GOAL #5   Title  The patient will improve gait speed to > or equal to 1.8 ft/sec to demonstrate dec'd risk for fall.    Time  8    Period  Weeks    Status  On-going            Plan - 09/10/19 1246    Clinical Impression Statement  Pt continues with decreased balance with retro gait and semi-tandem stance due to post lean; required min A at times to steady.  Pt's spO2 while on NuStep was 88-94%; some standing exercises, it had dropped to 78-82% but quickly returned within a min of seated  rest to 90%.  PT/PTA continues to recommend use of O2 (as prescribed) when in the community. Pt continues to make good gains each visit.    Comorbidities  frequent falls, COPD, RA, recent covid infection (1/21), dec'd safety awareness    Rehab Potential  Good    PT Frequency  2x / week    PT Duration  8 weeks    PT Treatment/Interventions  ADLs/Self Care Home Management;Gait training;Stair training;Functional mobility training;Therapeutic activities;Therapeutic exercise;Balance training;Neuromuscular re-education;Patient/family education;Vestibular;Manual techniques;DME Instruction;Moist Heat;Passive range of motion    PT Next Visit Plan 10th visit note   check on rollator + oxygen use (try to configure in a way she can manage it in community);  check spO2, monitor during session (patient should bring home O2), [place] balance HEP near countertop to initiate strengthening, safety with ambulation (encourage walker use-- may need to work on low back positioning/stretching to tolerate walker), standing balance.    PT Home Exercise Plan  Access Code: [email or web address]    Consulted and Agree with Plan of Care  Patient       Patient will benefit from skilled therapeutic intervention in order to improve the following  deficits and impairments:  Abnormal gait, Difficulty walking, Decreased strength, Postural dysfunction, Impaired flexibility, Hypomobility, Pain, Decreased activity tolerance, Decreased balance  Visit Diagnosis: Other abnormalities of gait and mobility  Unsteadiness on feet  Muscle weakness (generalized)  History of falling     Problem List Patient Active Problem List   Diagnosis Date Noted  . Diarrhea 07/16/2019  . AKI (acute kidney injury) ([place]) 07/16/2019  . Falls frequently 07/16/2019  . COVID-19 virus infection 05/16/2019  . Community acquired pneumonia   . Acute on chronic respiratory failure with hypoxia ([place]) 06/08/2018  . Acute respiratory failure with hypercapnia ([place])   . COPD exacerbation ([place]) 07/29/2017  . Rheumatoid arthritis ([place]) 07/29/2017  . Neuropathy 07/29/2017  . Acute respiratory failure with hypoxia ([place]) 07/29/2017  . Anemia 05/20/2016  . Hyperglycemia 05/20/2016  . Shoulder impingement syndrome 03/25/2015  . Chronic respiratory failure ([place]) 04/10/2012  . Constipation 07/03/2011  . COPD with acute exacerbation ([place]) 06/29/2011  . Stage 3 severe COPD by GOLD classification ([place]) 06/19/2007  . HLD (hyperlipidemia) 05/15/2007  . ACID REFLUX DISEASE 05/15/2007   Kerin Perna, PTA 09/10/19 1:06 PM  [place], [place], [address] Phone: [phone]   Fax:  [phone]  Name: TISH ETHEREDGE MRN: [id number] Date of Birth: 23-Jul-1935

## 2019-09-11 DIAGNOSIS — Z862 Personal history of diseases of the blood and blood-forming organs and certain disorders involving the immune mechanism: Secondary | ICD-10-CM | POA: Diagnosis not present

## 2019-09-11 DIAGNOSIS — J449 Chronic obstructive pulmonary disease, unspecified: Secondary | ICD-10-CM | POA: Diagnosis not present

## 2019-09-11 DIAGNOSIS — R000 Tachycardia, unspecified: Secondary | ICD-10-CM | POA: Diagnosis not present

## 2019-09-11 DIAGNOSIS — E782 Mixed hyperlipidemia: Secondary | ICD-10-CM | POA: Diagnosis not present

## 2019-09-11 DIAGNOSIS — G8929 Other chronic pain: Secondary | ICD-10-CM | POA: Diagnosis not present

## 2019-09-11 DIAGNOSIS — M25561 Pain in right knee: Secondary | ICD-10-CM | POA: Diagnosis not present

## 2019-09-11 DIAGNOSIS — M179 Osteoarthritis of knee, unspecified: Secondary | ICD-10-CM | POA: Diagnosis not present

## 2019-09-11 DIAGNOSIS — R7309 Other abnormal glucose: Secondary | ICD-10-CM | POA: Diagnosis not present

## 2019-09-14 ENCOUNTER — Other Ambulatory Visit: Payer: Self-pay

## 2019-09-14 ENCOUNTER — Telehealth: Payer: Self-pay

## 2019-09-14 ENCOUNTER — Encounter: Payer: Self-pay | Admitting: Rehabilitative and Restorative Service Providers"

## 2019-09-14 ENCOUNTER — Ambulatory Visit (INDEPENDENT_AMBULATORY_CARE_PROVIDER_SITE_OTHER): Payer: Medicare Other | Admitting: Rehabilitative and Restorative Service Providers"

## 2019-09-14 DIAGNOSIS — R2689 Other abnormalities of gait and mobility: Secondary | ICD-10-CM | POA: Diagnosis not present

## 2019-09-14 DIAGNOSIS — R42 Dizziness and giddiness: Secondary | ICD-10-CM

## 2019-09-14 DIAGNOSIS — R2681 Unsteadiness on feet: Secondary | ICD-10-CM

## 2019-09-14 DIAGNOSIS — Z9181 History of falling: Secondary | ICD-10-CM | POA: Diagnosis not present

## 2019-09-14 DIAGNOSIS — M6281 Muscle weakness (generalized): Secondary | ICD-10-CM | POA: Diagnosis not present

## 2019-09-14 NOTE — Telephone Encounter (Signed)
EKG ON FILE °

## 2019-09-14 NOTE — Therapy (Signed)
[place] [address] [place], [place], [address] Phone: [phone]   Fax:  [phone]  Physical Therapy Treatment and progress note.   Patient Details  Name: [name] MRN: [id number] Date of Birth: 1935/10/20 Referring Provider (PT): [name], MD   Encounter Date: 09/14/2019  PT End of Session - 09/14/19 1252    Visit Number  10    Number of Visits  16    Date for PT Re-Evaluation  10/12/19    PT Start Time  [id number]    PT Stop Time  1230    PT Time Calculation (min)  45 min    Equipment Utilized During Treatment  Gait belt    Behavior During Therapy  [facility] for tasks assessed/performed       Past Medical History:  Diagnosis Date  . ACID REFLUX DISEASE 05/15/2007   Qualifier: Diagnosis of  By: [name] MD, Christena Deem   . Acute and chronic respiratory failure with hypoxia ([place])   . Acute on chronic respiratory failure with hypoxia ([place]) 06/08/2018  . Acute respiratory failure with hypercapnia ([place])   . Acute respiratory failure with hypoxia ([place]) 07/29/2017  . Anemia 05/20/2016  . Arthritis   . Bladder neoplasm   . Body mass index 34.0-34.9, adult   . Bronchitis 07/06/2011  . Chest pain 07/06/2011  . Chronic cough   . Chronic respiratory failure ([place])   . Community acquired pneumonia   . Constipation 07/03/2011  . COPD (chronic obstructive pulmonary disease) (HCC)    PULMOLOGIST-  DR [name]--  GOLD III W/  CHRONIC RESPIRATORY FAILURE--  O2 DEPENDENT  . COPD with acute exacerbation ([place]) 06/29/2011  . Coronary arteriosclerosis   . Cough productive of purulent sputum   . Depression   . Dyslipidemia   . Falls frequently   . Fibromyalgia   . GERD (gastroesophageal reflux disease)   . Hearing loss in right ear   . Hematuria   . History of nephritis    as child dx w/ Bright's disease (glomerulonephritis)  . HLD (hyperlipidemia) 05/15/2007   Qualifier: Diagnosis of  By: [name] MD, Christena Deem   . Hyperglycemia  05/20/2016  . Hyperlipidemia   . Hypomagnesemia   . Hypoxemia   . Iron deficiency anemia, unspecified   . Leukopenia   . Low vitamin D level   . Mild obstructive sleep apnea    no cpap recommendation  . Neuropathy 07/29/2017  . Neuropathy, peripheral, idiopathic   . Osteopenia   . Other allergic rhinitis   . Pelvic pain in female   . Polyarthralgia   . PONV (postoperative nausea and vomiting)   . Pre-diabetes   . Rheumatoid arthritis ([place]) 07/29/2017  . Shoulder impingement syndrome 03/25/2015  . Sleep disorder, unspecified   . Spinal stenosis   . Stage 3 severe COPD by GOLD classification ([place]) 06/19/2007   Followed in Pulmonary clinic/ [facility]/ [place]  - hfa 90% 05/15/2012  - PFTs 05/15/2012 FEV1  0.61 ( 36%) ratio 57 and 21% better p B2 and DLCO 44%  106 corrected  - Referred to reahab 05/15/12 - Rec pulmonary f/u prn 02/11/13    . Supplemental oxygen dependent    2L  via Nasal Canula --  activity and nighttime  . Urinary incontinence in female     Past Surgical History:  Procedure Laterality Date  . APPENDECTOMY  1971  . CARDIAC CATHETERIZATION  05-08-2007  dr Marlou Porch   minor coronary plaquing w/ no  significant CAD/  20% mLAD,  10% mRCA,  perserved LV, ef 60-65%  . CATARACT EXTRACTION W/ INTRAOCULAR LENS  IMPLANT, BILATERAL  2001 approx  . CHOLECYSTECTOMY  1988  . CYSTOSCOPY W/ RETROGRADES Bilateral 09/24/2014   Procedure: CYSTOSCOPY WITH RETROGRADE PYELOGRAM;  Surgeon: Festus Aloe, MD;  Location: [facility];  Service: Urology;  Laterality: Bilateral;  . CYSTOSCOPY WITH BIOPSY N/A 09/24/2014   Procedure: CYSTOSCOPY WITH BIOPSY;  Surgeon: Festus Aloe, MD;  Location: [facility];  Service: Urology;  Laterality: N/A;  . ESOPHAGOGASTRODUODENOSCOPY  last one 09-09-2008  . FULGURATION OF BLADDER TUMOR N/A 09/24/2014   Procedure: FULGURATION OF BLADDER TUMOR;  Surgeon: Festus Aloe, MD;  Location: [facility];  Service:  Urology;  Laterality: N/A;  . [place]  . POSTERIOR LUMBAR FUSION  10-07-2007   bilateral laminectomy L4 and bilateral L4-5 diskectomy w/ fusion  . TONSILLECTOMY  as child  . TOTAL ABDOMINAL HYSTERECTOMY W/ BILATERAL SALPINGOOPHORECTOMY  1973  . TRANSTHORACIC ECHOCARDIOGRAM  07-07-2011   normal echo,  ef 65-70%    There were no vitals filed for this visit.  Subjective Assessment - 09/14/19 1149    Subjective  The patient reports she raised her walker height up (she shruggs her shoulders to her ears).  She did not sleep well last night.  She does not have her oxygen with her today.  She reports she did not get cleared for surgery from primary care MD.  The patient fell Saturday and Sunday over the weekend.    Pertinent History  COPD, RA, GERD, recent hospitalizations (covid in 1/21).    Patient Stated Goals  Reduce falls    Currently in Pain?  Yes    Pain Score  10-Worst pain ever    Pain Location  Knee    Pain Orientation  Right    Pain Descriptors / Indicators  Sore;Aching    Pain Type  Chronic pain    Pain Onset  More than a month ago    Pain Frequency  Intermittent    Aggravating Factors   everything    Pain Relieving Factors  rest                        [facility] Adult PT Treatment/Exercise - 09/14/19 1153      Ambulation/Gait   Ambulation/Gait  Yes    Ambulation/Gait Assistance  6: Modified independent (Device/Increase time);5: Supervision    Ambulation/Gait Assistance Details  The patient needs close sup to CGA walking 12 ft to nu step.    Ambulation Distance (Feet)  75 Feet    Assistive device  Rollator    Gait Comments  PT recommended we lower rollator one notch due to being too taller.  Patient notes "if you lower it, I'm going to raise it back up."      Neuro Re-ed    Neuro Re-ed Details   Standing side step R and L x 10 reps, standing hip extension taking R And L LE backwards steps.  SpO2% drops to 84% with standing activities.   gaze  adaptation x 1 viewing seated x 20 seconds      Exercises   Exercises  Knee/Hip      Knee/Hip Exercises: Aerobic   Nustep  L4 x 4 minutes with LEs only.    Other Aerobic  *waited until spO2%= > 90 after walking into clinic and O2 at 80%.      Knee/Hip  Exercises: Standing   Heel Raises  15 reps;Both      Knee/Hip Exercises: Supine   Bridges  Strengthening;Both;10 reps    Straight Leg Raises  Both;Strengthening;10 reps    Straight Leg Raise with External Rotation  Strengthening;Both;5 reps    Other Supine Knee/Hip Exercises  Supine marching 10 reps, lumbar rocking x 5 reps, and then 10 more supine marches x 10; lumbar rocking/ trunk rotation x 5 reps      Knee/Hip Exercises: Sidelying   Hip ABduction  Strengthening;Right;Left;10 reps               PT Short Term Goals - 09/07/19 1210      PT SHORT TERM GOAL #1   Title  The patient will be indep with initial HEP.    Baseline  Patient reports she is doing ther ex at home.    Time  4    Period  Weeks    Status  Achieved    Target Date  09/12/19      PT SHORT TERM GOAL #2   Title  The patient will ambulate with RW mod indep x 150 feet.    Time  6    Period  Weeks    Status  Achieved    Target Date  09/12/19      PT SHORT TERM GOAL #3   Title  The patient will improve 5 time sit to stand to < or equal to 35 seconds.    Baseline  30.1 second (08/27/19)    Time  6    Period  Weeks    Status  Achieved    Target Date  09/12/19      PT SHORT TERM GOAL #4   Title  The patient will improve Berg score from 15/56 to > or equal to 25/56 to demonstrate dec'ing risk for falls.    Time  6    Period  Weeks    Status  Achieved    Target Date  09/12/19        PT Long Term Goals - 09/14/19 1205      PT LONG TERM GOAL #1   Title  The patient will be indep with HEP for LE strengthening, balance, and flexibility.    Time  8    Period  Weeks    Status  On-going    Target Date  10/12/19      PT LONG TERM GOAL #2   Title   The patient will reduce 5 time sit<>stand to < or equal to 30 seconds to demonstrate improving LE strength.    Baseline  27.16 seconds improved from 41.09 seconds.    Time  8    Period  Weeks    Status  Achieved      PT LONG TERM GOAL #3   Title  The patient will imrpove Berg from 15/56 to > or equal to 30/56 to demonstrate improved steady state standing without UE Support.    Baseline  improved from 15 up to 30/56.    Time  8    Period  Weeks    Status  Achieved      PT LONG TERM GOAL #4   Title  The patient will tolerate walking x 3 minutes with use of RW and oxygen to demonstrate improving mobility for short community distances.    Time  8    Period  Weeks    Status  On-going      PT LONG  TERM GOAL #5   Title  The patient will improve gait speed to > or equal to 1.8 ft/sec to demonstrate dec'd risk for fall.    Time  8    Period  Weeks    Status  On-going            Plan - 09/14/19 1252    Clinical Impression Statement  The patient has been seen for 10 visits in physical therapy meeting all STGs and 2 LTGs.  She continues to have R LE pain, weakness, falls and dec'd endurance.  PT is focusing on continuing to progress LE strength, working on balance, and helping work on ability to manage DME better to allow for O2 use in the community.  Plan to continue working to [place].    Comorbidities  frequent falls, COPD, RA, recent covid infection (1/21), dec'd safety awareness    Rehab Potential  Good    PT Frequency  2x / week    PT Duration  8 weeks    PT Treatment/Interventions  ADLs/Self Care Home Management;Gait training;Stair training;Functional mobility training;Therapeutic activities;Therapeutic exercise;Balance training;Neuromuscular re-education;Patient/family education;Vestibular;Manual techniques;DME Instruction;Moist Heat;Passive range of motion    PT Next Visit Plan  check on rollator + oxygen use (try to configure in a way she can manage it in community);  check spO2,  monitor during session (patient should bring home O2), [place] balance HEP near countertop to initiate strengthening, safety with ambulation (encourage walker use-- may need to work on low back positioning/stretching to tolerate walker), standing balance.    PT Home Exercise Plan  Access Code: [email or web address]    Consulted and Agree with Plan of Care  Patient       Patient will benefit from skilled therapeutic intervention in order to improve the following deficits and impairments:  Abnormal gait, Difficulty walking, Decreased strength, Postural dysfunction, Impaired flexibility, Hypomobility, Pain, Decreased activity tolerance, Decreased balance  Visit Diagnosis: No diagnosis found.  Physical Therapy Progress Note   Dates of Reporting Period:08/13/19 to 09/14/19  *see above for patient measures and rationale for continued treatment  Thank you for the referral of this patient. Rudell Cobb, MPT     Problem List Patient Active Problem List   Diagnosis Date Noted  . Diarrhea 07/16/2019  . AKI (acute kidney injury) ([place]) 07/16/2019  . Falls frequently 07/16/2019  . COVID-19 virus infection 05/16/2019  . Community acquired pneumonia   . Acute on chronic respiratory failure with hypoxia ([place]) 06/08/2018  . Acute respiratory failure with hypercapnia ([place])   . COPD exacerbation ([place]) 07/29/2017  . Rheumatoid arthritis ([place]) 07/29/2017  . Neuropathy 07/29/2017  . Acute respiratory failure with hypoxia ([place]) 07/29/2017  . Anemia 05/20/2016  . Hyperglycemia 05/20/2016  . Shoulder impingement syndrome 03/25/2015  . Chronic respiratory failure ([place]) 04/10/2012  . Constipation 07/03/2011  . COPD with acute exacerbation ([place]) 06/29/2011  . Stage 3 severe COPD by GOLD classification ([place]) 06/19/2007  . HLD (hyperlipidemia) 05/15/2007  . ACID REFLUX DISEASE 05/15/2007    Quinlyn Tep 09/14/2019, 12:54 PM  [facility] [place], [place], [address] Phone: [phone]   Fax:  [phone]  Name: CLEMENCE STOMBAUGH MRN: [id number] Date of Birth: 09/19/1935

## 2019-09-16 ENCOUNTER — Encounter: Payer: Self-pay | Admitting: Internal Medicine

## 2019-09-16 ENCOUNTER — Ambulatory Visit (INDEPENDENT_AMBULATORY_CARE_PROVIDER_SITE_OTHER): Payer: Medicare Other | Admitting: Internal Medicine

## 2019-09-16 ENCOUNTER — Other Ambulatory Visit: Payer: Self-pay

## 2019-09-16 VITALS — BP 110/64 | HR 83 | Temp 97.7°F | Ht 60.0 in | Wt 157.2 lb

## 2019-09-16 DIAGNOSIS — J410 Simple chronic bronchitis: Secondary | ICD-10-CM

## 2019-09-16 DIAGNOSIS — J9611 Chronic respiratory failure with hypoxia: Secondary | ICD-10-CM | POA: Diagnosis not present

## 2019-09-16 DIAGNOSIS — Z01811 Encounter for preprocedural respiratory examination: Secondary | ICD-10-CM

## 2019-09-16 NOTE — Patient Instructions (Signed)
The patient should have follow up scheduled with myself in 6 months for COPD

## 2019-09-16 NOTE — Progress Notes (Signed)
[name]    [id number]    02-Apr-1936  Primary Care Physician:McNeill, Abigail Butts, MD  Referring Physician: [name], [place],  [place] [address] Reason for Consultation: pre-operative evaluation Date of Consultation: 09/16/2019  Chief complaint:   Chief Complaint  Patient presents with  . Consult    COPD Surgical clearence for knee replacement     HPI: [name] is 84 y.o. woman with COPD on 2LNC, OSA not on CPAP, coronary artery disease and RA on immune suppression with MTX and plaquenil. She was hospitalized with COVID in Jan 2021 and treated with steroids and remdesevir.  Here for preoperative evaluation for total knee arthroplasty with Dr Susa Day. Previously seen Dr. [name] for COPD. Last seen in 2014.  FEV1 36%.  Has been on oxygen for 17 years.  She is supposed to be on oxygen continuously but came without it today. Her oxygen saturation was 94% on room air at rest. She was hospitalized three times last year for pneumonia.   Lives at home with her 4 yo grand daughter and her son.  She has difficulty with ADLs including showering and doing her hair. Dyspnea is the main reason.  She is on trelegy inhaler with prn albuterol which she takes depending on exertion.  Some days she doesn't even get out of her chair which she attributes to breathing rather than joint pain. She is doing outpatient physical therapy for frequent falls and strengthening. She does still drive.   She has dyspnea, wheezing, cough green sputum production which is occasional and not daily. No hemoptysis, fevers, chills, night sweats or weight loss.  Has difficulty with her breathing if she lays flat for too long. She sleeps with bed elevated about 6 inches. One pillow.   She notes frequent episodes of pneumonia and flares of her COPD.   Social History   Occupational History  . Not on file  Tobacco Use  . Smoking status: Former Smoker    Packs/day: 3.00   Years: 60.00    Pack years: 180.00    Types: Cigarettes    Quit date: 06/25/2011    Years since quitting: 8.2  . Smokeless tobacco: Never Used  Substance and Sexual Activity  . Alcohol use: No  . Drug use: No  . Sexual activity: Not on file    Relevant family history:  Family History  Problem Relation Age of Onset  . Asthma Maternal Grandmother   . Breast cancer Maternal Grandmother     Past Medical History:  Diagnosis Date  . ACID REFLUX DISEASE 05/15/2007   Qualifier: Diagnosis of  By: [name] MD, Christena Deem   . Acute and chronic respiratory failure with hypoxia ([place])   . Acute on chronic respiratory failure with hypoxia ([place]) 06/08/2018  . Acute respiratory failure with hypercapnia ([place])   . Acute respiratory failure with hypoxia ([place]) 07/29/2017  . Anemia 05/20/2016  . Arthritis   . Bladder neoplasm   . Body mass index 34.0-34.9, adult   . Bronchitis 07/06/2011  . Chest pain 07/06/2011  . Chronic cough   . Chronic respiratory failure ([place])   . Community acquired pneumonia   . Constipation 07/03/2011  . COPD (chronic obstructive pulmonary disease) (HCC)    PULMOLOGIST-  DR [name]--  GOLD III W/  CHRONIC RESPIRATORY FAILURE--  O2 DEPENDENT  . COPD with acute exacerbation ([place]) 06/29/2011  . Coronary arteriosclerosis   . Cough productive of purulent  sputum   . Depression   . Dyslipidemia   . Falls frequently   . Fibromyalgia   . GERD (gastroesophageal reflux disease)   . Hearing loss in right ear   . Hematuria   . History of nephritis    as child dx w/ Bright's disease (glomerulonephritis)  . HLD (hyperlipidemia) 05/15/2007   Qualifier: Diagnosis of  By: [name] MD, Christena Deem   . Hyperglycemia 05/20/2016  . Hyperlipidemia   . Hypomagnesemia   . Hypoxemia   . Iron deficiency anemia, unspecified   . Leukopenia   . Low vitamin D level   . Mild obstructive sleep apnea    no cpap recommendation  . Neuropathy 07/29/2017  . Neuropathy, peripheral, idiopathic   . Osteopenia   .  Other allergic rhinitis   . Pelvic pain in female   . Polyarthralgia   . PONV (postoperative nausea and vomiting)   . Pre-diabetes   . Rheumatoid arthritis ([place]) 07/29/2017  . Shoulder impingement syndrome 03/25/2015  . Sleep disorder, unspecified   . Spinal stenosis   . Stage 3 severe COPD by GOLD classification ([place]) 06/19/2007   Followed in Pulmonary clinic/ [facility]/ [facility]  - hfa 90% 05/15/2012  - PFTs 05/15/2012 FEV1  0.61 ( 36%) ratio 57 and 21% better p B2 and DLCO 44%  106 corrected  - Referred to reahab 05/15/12 - Rec pulmonary f/u prn 02/11/13    . Supplemental oxygen dependent    2L  via Nasal Canula --  activity and nighttime  . Urinary incontinence in female     Past Surgical History:  Procedure Laterality Date  . APPENDECTOMY  1971  . CARDIAC CATHETERIZATION  05-08-2007  dr Marlou Porch   minor coronary plaquing w/ no significant CAD/  20% mLAD,  10% mRCA,  perserved LV, ef 60-65%  . CATARACT EXTRACTION W/ INTRAOCULAR LENS  IMPLANT, BILATERAL  2001 approx  . CHOLECYSTECTOMY  1988  . CYSTOSCOPY W/ RETROGRADES Bilateral 09/24/2014   Procedure: CYSTOSCOPY WITH RETROGRADE PYELOGRAM;  Surgeon: Festus Aloe, MD;  Location: [facility];  Service: Urology;  Laterality: Bilateral;  . CYSTOSCOPY WITH BIOPSY N/A 09/24/2014   Procedure: CYSTOSCOPY WITH BIOPSY;  Surgeon: Festus Aloe, MD;  [facility];  Service: Urology;  Laterality: N/A;  . ESOPHAGOGASTRODUODENOSCOPY  last one 09-09-2008  . FULGURATION OF BLADDER TUMOR N/A 09/24/2014   Procedure: FULGURATION OF BLADDER TUMOR;  Surgeon: Festus Aloe, MD;  Location: [facility];  Service: Urology;  Laterality: N/A;  . [place]  . POSTERIOR LUMBAR FUSION  10-07-2007   bilateral laminectomy L4 and bilateral L4-5 diskectomy w/ fusion  . TONSILLECTOMY  as child  . TOTAL ABDOMINAL HYSTERECTOMY W/ BILATERAL SALPINGOOPHORECTOMY  1973  . TRANSTHORACIC ECHOCARDIOGRAM   07-07-2011   normal echo,  ef 65-70%    Physical Exam: Blood pressure 110/64, pulse 83, temperature 97.7 F (36.5 C), temperature source Oral, height 5' (1.524 m), weight 157 lb 3.2 oz (71.3 kg), SpO2 93 %. Gen:      No acute distress ENT:  no nasal polyps, mucus membranes moist Lungs:    No increased respiratory effort, symmetric chest wall excursion, clear to auscultation bilaterally but diminished, no wheezes or crackles CV:         Regular rate and rhythm; no murmurs, rubs, or gallops.  No pedal edema Abd:      + bowel sounds; soft, non-tender; no distension MSK: no acute synovitis of DIP or PIP joints,  no mechanics hands.  Skin:      Warm and dry; no rashes Neuro: normal speech, no focal facial asymmetry Psych: alert and oriented x3, normal mood and affect   Data Reviewed/Medical Decision Making:  Independent interpretation of tests: Imaging: . Review of patient's chest xray march 2021 images revealed left basilar atelectasis. The patient's images have been independently reviewed by me.  CT Angio Jan 2021 also reviewed, no PE, no emphysema no nodules or effusions.   Echocardiogram 07/2017 - Left ventricle: The cavity size was normal. Wall thickness was  increased in a pattern of moderate LVH. Systolic function was  normal. The estimated ejection fraction was in the range of 55%  to 60%. Incoordinate septal motion. Doppler parameters are  consistent with abnormal left ventricular relaxation (grade 1  diastolic dysfunction). The E/e&' ratio is between 8-15,  suggesting indeterminate LV filling pressure.  - Mitral valve: Mildly thickened leaflets . There was trivial  regurgitation.  - Left atrium: The atrium was normal in size.  - Atrial septum: A patent foramen ovale cannot be excluded based on  a color doppler signal. Consider bubble study if indicated.  - Inferior vena cava: The vessel was dilated. The respirophasic  diameter changes were blunted (< 50%),  consistent with elevated  central venous pressure.   PFTs: PFTs reviewed from 2014 show very severe airflow limitation with a pre-bronchodilator FEV1 36% of predicted. There was a positive BD response.  Labs:  Lab Results  Component Value Date   NA 140 07/18/2019   K 3.5 07/18/2019   CL 104 07/18/2019   CO2 24 07/18/2019   Lab Results  Component Value Date   WBC 10.2 07/18/2019   HGB 11.2 (L) 07/18/2019   HCT 36.9 07/18/2019   MCV 97.4 07/18/2019   PLT 316 07/18/2019     Immunization status:  Immunization History  Administered Date(s) Administered  . Influenza Whole 04/23/2012  . Influenza, High Dose Seasonal PF 02/26/2018  . Influenza,inj,quad, With Preservative 02/05/2019  . Zoster Recombinat (Shingrix) 12/16/2017    . I reviewed prior external note(s) from Dr. Leonides Schanz . I reviewed the result(s) of the labs and imaging as noted above.   ASSESSMENT AND RECOMMENDATIONS:  Very Severe COPD - FEV1 36% of predicted.  Mild OSA not on CPAP Chronic Hypoxemic Respiratory Failure Preo-operative evaluation - intermediate risk for post-operative respiratory failure.   Continue trelegy.  Continue prn albuterol. Continue home oxygen.  I will see her back in 6 months for her COPD.   Preoperative Risk Calculation: The features of this patient's history that contribute to the pulmonary risk assessment include: Age, COPD, General anesthesia, surgery >2 hours.  This patient has an intermediate risk of post-operative pulmonary complications by ARISCAT Index.  The absolute assessment of risk/benefit of the procedure is deferred to the primary team's evaluation.  - Patient's Estimated risk of postoperative respiratory failure is 13.3% based on the ARISCAT Index.   0 to 25 points: Low risk: [id number] pulmonary complication rate  26 to 44 points: Intermediate risk: 0000000 pulmonary complication rate (total 40) 45 to 123 points: High risk: [id number] pulmonary complication rate  Postoperative  respiratory failure (PRF) is considered as failure to wean from mechanical ventilation within 48 hours of surgery or unplanned intubation/reintubation postoperatively. The validated risk calculator provides a risk estimate of PRF and is anticipated to aid in surgical decision-making and informed patient consent. However risk can be accepted given the potential benefit of this intervention and it is not prohibitive.  RECOMMENDATIONS:  In order to minimize the risk of complications and optimize pulmonary status, we recommend the following:  - Encourage aggressive incentive spirometry hourly both peri-operatively and post-operatively as tolerated  - Early ambulation and physical therapy as tolerated post-operatively - Adequate pain control especially in the setting of abdominal and thoracic surgery - Bronchodilators as needed for wheezing or shortness of breath - Oral steroids only if the patient appears to have component of COPD exacerbation, otherwise no routine utilization needed - Intraoperatively keep OR time to the shortest as possible - Post operatively may benefit from Nocturnal Bipap  ARISCAT: Mazo et al. Anesthesiology 2014; 121:219-31  We discussed disease management and progression at length today.   I spent 45 minutes in the care of this patient today including pre-charting, chart review, review of results, face-to-face care, coordination of care and communication with consultants etc.).  Return to Care: Return in about 6 months (around 03/18/2020), or if symptoms worsen or fail to improve, for COPD.  Lenice Llamas, MD Pulmonary and [place]  CC: [name], MD

## 2019-09-17 ENCOUNTER — Ambulatory Visit (INDEPENDENT_AMBULATORY_CARE_PROVIDER_SITE_OTHER): Payer: Medicare Other | Admitting: Physical Therapy

## 2019-09-17 ENCOUNTER — Encounter: Payer: Self-pay | Admitting: Physical Therapy

## 2019-09-17 DIAGNOSIS — R2681 Unsteadiness on feet: Secondary | ICD-10-CM | POA: Diagnosis not present

## 2019-09-17 DIAGNOSIS — M6281 Muscle weakness (generalized): Secondary | ICD-10-CM

## 2019-09-17 DIAGNOSIS — Z9181 History of falling: Secondary | ICD-10-CM | POA: Diagnosis not present

## 2019-09-17 DIAGNOSIS — R2689 Other abnormalities of gait and mobility: Secondary | ICD-10-CM

## 2019-09-17 NOTE — Therapy (Signed)
[facility] [place], [place], [address] Phone: [phone]   Fax:  [phone]  Physical Therapy Treatment  Patient Details  Name: [name] MRN: [id number] Date of Birth: May 18, 1935 Referring Provider (PT): [name], MD   Encounter Date: 09/17/2019  PT End of Session - 09/17/19 1131    Visit Number  11    Number of Visits  16    Date for PT Re-Evaluation  10/12/19    PT Start Time  [id number]    PT Stop Time  1223    PT Time Calculation (min)  49 min    Equipment Utilized During Treatment  Gait belt    Activity Tolerance  Patient tolerated treatment well    Behavior During Therapy  [facility] for tasks assessed/performed       Past Medical History:  Diagnosis Date  . ACID REFLUX DISEASE 05/15/2007   Qualifier: Diagnosis of  By: [name] MD, Christena Deem   . Acute and chronic respiratory failure with hypoxia ([place])   . Acute on chronic respiratory failure with hypoxia ([place]) 06/08/2018  . Acute respiratory failure with hypercapnia ([place])   . Acute respiratory failure with hypoxia ([place]) 07/29/2017  . Anemia 05/20/2016  . Arthritis   . Bladder neoplasm   . Body mass index 34.0-34.9, adult   . Bronchitis 07/06/2011  . Chest pain 07/06/2011  . Chronic cough   . Chronic respiratory failure ([place])   . Community acquired pneumonia   . Constipation 07/03/2011  . COPD (chronic obstructive pulmonary disease) (HCC)    PULMOLOGIST-  DR [name]--  GOLD III W/  CHRONIC RESPIRATORY FAILURE--  O2 DEPENDENT  . COPD with acute exacerbation ([place]) 06/29/2011  . Coronary arteriosclerosis   . Cough productive of purulent sputum   . Depression   . Dyslipidemia   . Falls frequently   . Fibromyalgia   . GERD (gastroesophageal reflux disease)   . Hearing loss in right ear   . Hematuria   . History of nephritis    as child dx w/ Bright's disease (glomerulonephritis)  . HLD (hyperlipidemia) 05/15/2007   Qualifier: Diagnosis of  By: [name] MD,  Christena Deem   . Hyperglycemia 05/20/2016  . Hyperlipidemia   . Hypomagnesemia   . Hypoxemia   . Iron deficiency anemia, unspecified   . Leukopenia   . Low vitamin D level   . Mild obstructive sleep apnea    no cpap recommendation  . Neuropathy 07/29/2017  . Neuropathy, peripheral, idiopathic   . Osteopenia   . Other allergic rhinitis   . Pelvic pain in female   . Polyarthralgia   . PONV (postoperative nausea and vomiting)   . Pre-diabetes   . Rheumatoid arthritis ([place]) 07/29/2017  . Shoulder impingement syndrome 03/25/2015  . Sleep disorder, unspecified   . Spinal stenosis   . Stage 3 severe COPD by GOLD classification ([place]) 06/19/2007   Followed in Pulmonary clinic/ [facility]/ [place]  - hfa 90% 05/15/2012  - PFTs 05/15/2012 FEV1  0.61 ( 36%) ratio 57 and 21% better p B2 and DLCO 44%  106 corrected  - Referred to reahab 05/15/12 - Rec pulmonary f/u prn 02/11/13    . Supplemental oxygen dependent    2L  via Nasal Canula --  activity and nighttime  . Urinary incontinence in female     Past Surgical History:  Procedure Laterality Date  . APPENDECTOMY  1971  . CARDIAC CATHETERIZATION  05-08-2007  dr Marlou Porch  minor coronary plaquing w/ no significant CAD/  20% mLAD,  10% mRCA,  perserved LV, ef 60-65%  . CATARACT EXTRACTION W/ INTRAOCULAR LENS  IMPLANT, BILATERAL  2001 approx  . CHOLECYSTECTOMY  1988  . CYSTOSCOPY W/ RETROGRADES Bilateral 09/24/2014   Procedure: CYSTOSCOPY WITH RETROGRADE PYELOGRAM;  Surgeon: Festus Aloe, MD;  Location: [facility];  Service: Urology;  Laterality: Bilateral;  . CYSTOSCOPY WITH BIOPSY N/A 09/24/2014   Procedure: CYSTOSCOPY WITH BIOPSY;  Surgeon: Festus Aloe, MD;  Location: [facility];  Service: Urology;  Laterality: N/A;  . ESOPHAGOGASTRODUODENOSCOPY  last one 09-09-2008  . FULGURATION OF BLADDER TUMOR N/A 09/24/2014   Procedure: FULGURATION OF BLADDER TUMOR;  Surgeon: Festus Aloe, MD;  Location[facility];  Service: Urology;  Laterality: N/A;  . [place]  . POSTERIOR LUMBAR FUSION  10-07-2007   bilateral laminectomy L4 and bilateral L4-5 diskectomy w/ fusion  . TONSILLECTOMY  as child  . TOTAL ABDOMINAL HYSTERECTOMY W/ BILATERAL SALPINGOOPHORECTOMY  1973  . TRANSTHORACIC ECHOCARDIOGRAM  07-07-2011   normal echo,  ef 65-70%    There were no vitals filed for this visit.  Subjective Assessment - 09/17/19 1147    Subjective  Pt reports her Dr will not allow her to have TKR surgery.  She ambulates into clinic with SPC and no oxygen. She states her oxygen tanks are empty.    Currently in Pain?  Yes    Pain Score  5     Pain Location  Knee    Pain Orientation  Right         [facility] PT Assessment - 09/17/19 0001      Assessment   Medical Diagnosis  Frequent falls, R hip/knee pain, walker consultation    Referring Provider (PT)  [name], MD    Onset Date/Surgical Date  08/05/19    Hand Dominance  Right        TREATMENT  Gait: 3.5 min ambulating with rollator (no oxygen, but spO2 monitored) on level indoor surface.   Gait speed of 1.52 ft per second.   Antalgic gait with limp with RLE, decreased stance time on RLE.  spO2 88% until 3 min, where spO2 began to drop to 85%.   Seated rest break on rollator for 2 min until spO2 returned to 90%.    Therapeutic exercise:   Aerobic-  Nustep L4 x 6 min, legs only.  Supine- bridge x 5 reps, 2 sets; marching with TA engaged x 10 reps;  heel slides with foot elevated above surface with TA engaged x 10 each leg;  single leg clam with yellow band as resistance x 10 reps each leg;   Rt/Lt hamstring stretch with strap x 30 sec x 2 reps each leg;  Rt/Lt piriformis fig 4 stretch in hooklying x 30 sec x 2 reps each leg.   Seated-  Resisted bilat UE row with yellow band x 10 reps with cues on form.     PT Short Term Goals - 09/07/19 1210      PT SHORT TERM GOAL #1   Title  The patient will be indep with initial  HEP.    Baseline  Patient reports she is doing ther ex at home.    Time  4    Period  Weeks    Status  Achieved    Target Date  09/12/19      PT SHORT TERM GOAL #2   Title  The patient  will ambulate with RW mod indep x 150 feet.    Time  6    Period  Weeks    Status  Achieved    Target Date  09/12/19      PT SHORT TERM GOAL #3   Title  The patient will improve 5 time sit to stand to < or equal to 35 seconds.    Baseline  30.1 second (08/27/19)    Time  6    Period  Weeks    Status  Achieved    Target Date  09/12/19      PT SHORT TERM GOAL #4   Title  The patient will improve Berg score from 15/56 to > or equal to 25/56 to demonstrate dec'ing risk for falls.    Time  6    Period  Weeks    Status  Achieved    Target Date  09/12/19        PT Long Term Goals - 09/14/19 1205      PT LONG TERM GOAL #1   Title  The patient will be indep with HEP for LE strengthening, balance, and flexibility.    Time  8    Period  Weeks    Status  On-going    Target Date  10/12/19      PT LONG TERM GOAL #2   Title  The patient will reduce 5 time sit<>stand to < or equal to 30 seconds to demonstrate improving LE strength.    Baseline  27.16 seconds improved from 41.09 seconds.    Time  8    Period  Weeks    Status  Achieved      PT LONG TERM GOAL #3   Title  The patient will imrpove Berg from 15/56 to > or equal to 30/56 to demonstrate improved steady state standing without UE Support.    Baseline  improved from 15 up to 30/56.    Time  8    Period  Weeks    Status  Achieved      PT LONG TERM GOAL #4   Title  The patient will tolerate walking x 3 minutes with use of RW and oxygen to demonstrate improving mobility for short community distances.    Time  8    Period  Weeks    Status  On-going      PT LONG TERM GOAL #5   Title  The patient will improve gait speed to > or equal to 1.8 ft/sec to demonstrate dec'd risk for fall.    Time  8    Period  Weeks    Status  On-going         ASSESSMENT STATEMENT: Pt able to ambulate for 3 min with spO2 hovering at 88%, with good pacing of speed and minimal shortness of breath.  Pt continues to demonstrate improved exercise tolerance with good spO2 levels on days following a good night's rest. She tolerated supine exercises well, with spO2 91-94%.   Pt near meeting remaining goals.    PLAN: Continue working on balance, increasing gait tolerance and strength.    Patient will benefit from skilled therapeutic intervention in order to improve the following deficits and impairments:     Visit Diagnosis: Other abnormalities of gait and mobility  Unsteadiness on feet  Muscle weakness (generalized)  History of falling     Problem List Patient Active Problem List   Diagnosis Date Noted  . Diarrhea 07/16/2019  . AKI (acute kidney  injury) ([place]) 07/16/2019  . Falls frequently 07/16/2019  . COVID-19 virus infection 05/16/2019  . Community acquired pneumonia   . Acute on chronic respiratory failure with hypoxia ([place]) 06/08/2018  . Acute respiratory failure with hypercapnia ([place])   . COPD exacerbation ([place]) 07/29/2017  . Rheumatoid arthritis ([place]) 07/29/2017  . Neuropathy 07/29/2017  . Acute respiratory failure with hypoxia ([place]) 07/29/2017  . Anemia 05/20/2016  . Hyperglycemia 05/20/2016  . Shoulder impingement syndrome 03/25/2015  . Chronic respiratory failure ([place]) 04/10/2012  . Constipation 07/03/2011  . COPD with acute exacerbation ([place]) 06/29/2011  . Stage 3 severe COPD by GOLD classification ([place]) 06/19/2007  . HLD (hyperlipidemia) 05/15/2007  . ACID REFLUX DISEASE 05/15/2007    Kerin Perna, PTA 09/17/19 6:09 PM  [facility] [place], [place], [address] Phone: [phone]   Fax:  [phone]  Name: [name] MRN: [id number] Date of Birth: 20-Apr-1936

## 2019-09-22 ENCOUNTER — Other Ambulatory Visit: Payer: Self-pay

## 2019-09-22 ENCOUNTER — Ambulatory Visit (INDEPENDENT_AMBULATORY_CARE_PROVIDER_SITE_OTHER): Payer: Medicare Other | Admitting: Rehabilitative and Restorative Service Providers"

## 2019-09-22 DIAGNOSIS — Z9181 History of falling: Secondary | ICD-10-CM

## 2019-09-22 DIAGNOSIS — R2689 Other abnormalities of gait and mobility: Secondary | ICD-10-CM

## 2019-09-22 DIAGNOSIS — M6281 Muscle weakness (generalized): Secondary | ICD-10-CM | POA: Diagnosis not present

## 2019-09-22 DIAGNOSIS — R2681 Unsteadiness on feet: Secondary | ICD-10-CM | POA: Diagnosis not present

## 2019-09-22 DIAGNOSIS — R42 Dizziness and giddiness: Secondary | ICD-10-CM | POA: Diagnosis not present
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
## 2019-09-24 ENCOUNTER — Other Ambulatory Visit: Payer: Self-pay

## 2019-09-24 ENCOUNTER — Ambulatory Visit (INDEPENDENT_AMBULATORY_CARE_PROVIDER_SITE_OTHER): Payer: Medicare Other | Admitting: Physical Therapy

## 2019-09-24 ENCOUNTER — Encounter: Payer: Self-pay | Admitting: Physical Therapy

## 2019-09-24 DIAGNOSIS — M6281 Muscle weakness (generalized): Secondary | ICD-10-CM | POA: Diagnosis not present

## 2019-09-24 DIAGNOSIS — R2689 Other abnormalities of gait and mobility: Secondary | ICD-10-CM

## 2019-09-24 DIAGNOSIS — Z9181 History of falling: Secondary | ICD-10-CM | POA: Diagnosis not present

## 2019-09-24 DIAGNOSIS — R2681 Unsteadiness on feet: Secondary | ICD-10-CM

## 2019-09-24 NOTE — Therapy (Addendum)
[place], [place], [address] Phone: [phone]   Fax:  [phone]  Physical Therapy Treatment and Discharge Summary  Patient Details  Name: [name] MRN: [id number] Date of Birth: 1935/07/20 Referring Provider (PT): [name], MD   Encounter Date: 09/24/2019  PT End of Session - 09/24/19 1152    Visit Number  13    Number of Visits  16    Date for PT Re-Evaluation  10/12/19    PT Start Time  0712    PT Stop Time  1227    PT Time Calculation (min)  38 min    Activity Tolerance  Patient tolerated treatment well    Behavior During Therapy  [facility] for tasks assessed/performed       Past Medical History:  Diagnosis Date  . ACID REFLUX DISEASE 05/15/2007   Qualifier: Diagnosis of  By: [name] MD, Christena Deem   . Acute and chronic respiratory failure with hypoxia ([place])   . Acute on chronic respiratory failure with hypoxia ([place]) 06/08/2018  . Acute respiratory failure with hypercapnia ([place])   . Acute respiratory failure with hypoxia ([place]) 07/29/2017  . Anemia 05/20/2016  . Arthritis   . Bladder neoplasm   . Body mass index 34.0-34.9, adult   . Bronchitis 07/06/2011  . Chest pain 07/06/2011  . Chronic cough   . Chronic respiratory failure ([place])   . Community acquired pneumonia   . Constipation 07/03/2011  . COPD (chronic obstructive pulmonary disease) (H[facility])    PULMOLOGIST-  DR [name]--  GOLD III W/  CHRONIC RESPIRATORY FAILURE--  O2 DEPENDENT  . COPD with acute exacerbation ([place]) 06/29/2011  . Coronary arteriosclerosis   . Cough productive of purulent sputum   . Depression   . Dyslipidemia   . Falls frequently   . Fibromyalgia   . GERD (gastroesophageal reflux disease)   . Hearing loss in right ear   . Hematuria   . History of nephritis    as child dx w/ Bright's disease (glomerulonephritis)  . HLD (hyperlipidemia) 05/15/2007   Qualifier: Diagnosis of  By: [name] MD, Christena Deem   . Hyperglycemia  05/20/2016  . Hyperlipidemia   . Hypomagnesemia   . Hypoxemia   . Iron deficiency anemia, unspecified   . Leukopenia   . Low vitamin D level   . Mild obstructive sleep apnea    no cpap recommendation  . Neuropathy 07/29/2017  . Neuropathy, peripheral, idiopathic   . Osteopenia   . Other allergic rhinitis   . Pelvic pain in female   . Polyarthralgia   . PONV (postoperative nausea and vomiting)   . Pre-diabetes   . Rheumatoid arthritis ([place]) 07/29/2017  . Shoulder impingement syndrome 03/25/2015  . Sleep disorder, unspecified   . Spinal stenosis   . Stage 3 severe COPD by GOLD classification ([place]) 06/19/2007   Followed in Pulmonary clinic/ [facility]/ [place]  - hfa 90% 05/15/2012  - PFTs 05/15/2012 FEV1  0.61 ( 36%) ratio 57 and 21% better p B2 and DLCO 44%  106 corrected  - Referred to [facility] 05/15/12 - Rec pulmonary f/u prn 02/11/13    . Supplemental oxygen dependent    2L  via Nasal Canula --  activity and nighttime  . Urinary incontinence in female     Past Surgical History:  Procedure Laterality Date  . APPENDECTOMY  1971  . CARDIAC CATHETERIZATION  05-08-2007  dr Marlou Porch   minor coronary plaquing w/ no significant  CAD/  20% mLAD,  10% mRCA,  perserved LV, ef 60-65%  . CATARACT EXTRACTION W/ INTRAOCULAR LENS  IMPLANT, BILATERAL  2001 approx  . CHOLECYSTECTOMY  1988  . CYSTOSCOPY W/ RETROGRADES Bilateral 09/24/2014   Procedure: CYSTOSCOPY WITH RETROGRADE PYELOGRAM;  Surgeon: Festus Aloe, MD;  Location: [facility];  Service: Urology;  Laterality: Bilateral;  . CYSTOSCOPY WITH BIOPSY N/A 09/24/2014   Procedure: CYSTOSCOPY WITH BIOPSY;  Surgeon: Festus Aloe, MD;  Location: [facility];  Service: Urology;  Laterality: N/A;  . ESOPHAGOGASTRODUODENOSCOPY  last one 09-09-2008  . FULGURATION OF BLADDER TUMOR N/A 09/24/2014   Procedure: FULGURATION OF BLADDER TUMOR;  Surgeon: Festus Aloe, MD;  Location: [facility];  Service:  Urology;  Laterality: N/A;  . [place]  . POSTERIOR LUMBAR FUSION  10-07-2007   bilateral laminectomy L4 and bilateral L4-5 diskectomy w/ fusion  . TONSILLECTOMY  as child  . TOTAL ABDOMINAL HYSTERECTOMY W/ BILATERAL SALPINGOOPHORECTOMY  1973  . TRANSTHORACIC ECHOCARDIOGRAM  07-07-2011   normal echo,  ef 65-70%    There were no vitals filed for this visit.  Subjective Assessment - 09/24/19 1153    Subjective  She states she tripped on something and almost fell, but was able recover ok.  Pt reports readiness to d/c after today's visit.    Pertinent History  COPD, RA, GERD, recent hospitalizations (covid in 1/21).    Patient Stated Goals  Reduce falls    Currently in Pain?  Yes    Pain Score  5     Pain Location  Knee    Pain Orientation  Right    Pain Descriptors / Indicators  Aching    Aggravating Factors   everything    Pain Relieving Factors  rest         [facility] PT Assessment - 09/24/19 0001      Assessment   Medical Diagnosis  Frequent falls, R hip/knee pain, walker consultation    Referring Provider (PT)  [name], MD    Onset Date/Surgical Date  08/05/19    Hand Dominance  Right       [facility] Adult PT Treatment/Exercise - 09/24/19 0001      Ambulation/Gait   Ambulation/Gait  Yes    Ambulation/Gait Assistance  6: Modified independent (Device/Increase time)    Ambulation/Gait Assistance Details  slow pace, with cues for pacing when spO2 drops.  spO2 84-98%     Ambulation Distance (Feet)  240 Feet   70 ft, and 100 ft seperate trials.  O2 monitored.   Assistive device  Rollator    Gait velocity  1.94 ft/sec    16 ft, 8.24 sec; seperate from 240 ft   Gait Comments  PT accompanies patient to car to assist with loading the rollator into her car.      Neck Exercises: Seated   Other Seated Exercise  seated, W's x 5 sec x 12 reps       Knee/Hip Exercises: Stretches   Passive Hamstring Stretch  Right;Left;3 reps;30 seconds   PTA assist in supine      Knee/Hip Exercises: Aerobic   Nustep  L 4 x 7 minutes with LEs only    Other Aerobic  *waited until spO2%= > 90 after walking into clinic and O2 at 84%.      Balance Exercises - 09/24/19 1301      Balance Exercises: Standing   Tandem Stance  Intermittent upper extremity support;2 reps;15 secs  semi-tandem, CGA-min A       PT Education - 09/24/19 1301    Education Details  verbally/visually reviewed HEP.    Person(s) Educated  Patient    Methods  Explanation    Comprehension  Verbalized understanding       PT Short Term Goals - 09/07/19 1210      PT SHORT TERM GOAL #1   Title  The patient will be indep with initial HEP.    Baseline  Patient reports she is doing ther ex at home.    Time  4    Period  Weeks    Status  Achieved    Target Date  09/12/19      PT SHORT TERM GOAL #2   Title  The patient will ambulate with RW mod indep x 150 feet.    Time  6    Period  Weeks    Status  Achieved    Target Date  09/12/19      PT SHORT TERM GOAL #3   Title  The patient will improve 5 time sit to stand to < or equal to 35 seconds.    Baseline  30.1 second (08/27/19)    Time  6    Period  Weeks    Status  Achieved    Target Date  09/12/19      PT SHORT TERM GOAL #4   Title  The patient will improve Berg score from 15/56 to > or equal to 25/56 to demonstrate dec'ing risk for falls.    Time  6    Period  Weeks    Status  Achieved    Target Date  09/12/19        PT Long Term Goals - 09/24/19 1302      PT LONG TERM GOAL #1   Title  The patient will be indep with HEP for LE strengthening, balance, and flexibility.    Time  8    Period  Weeks    Status  Achieved      PT LONG TERM GOAL #2   Title  The patient will reduce 5 time sit<>stand to < or equal to 30 seconds to demonstrate improving LE strength.    Baseline  27.16 seconds improved from 41.09 seconds.    Time  8    Period  Weeks    Status  Achieved      PT LONG TERM GOAL #3   Title  The patient will imrpove  Berg from 15/56 to > or equal to 30/56 to demonstrate improved steady state standing without UE Support.    Baseline  improved from 15 up to 30/56.    Time  8    Period  Weeks    Status  Achieved      PT LONG TERM GOAL #4   Title  The patient will tolerate walking x 3 minutes with use of RW and oxygen to demonstrate improving mobility for short community distances.    Baseline  Patient not utilizing her supplemental oxygen in the community.  She can do 3 minutes, but it does drop to 85% between 2-3 minutes.    Time  8    Period  Weeks    Status  Partially Met      PT LONG TERM GOAL #5   Title  The patient will improve gait speed to > or equal to 1.8 ft/sec to demonstrate dec'd risk for fall.    Time  8  Period  Weeks    Status  Achieved            Plan - 09/24/19 1303    Clinical Impression Statement  Pt's gait speed improved to 1.92 ft/sec (for short duration of 16 ft); otherwise pt walks at slow speed with rollator. HEP has been reviewed, and all goals except LTG#4 have been met.  Pt verbalized desire to d/c to HEP at this time.    Comorbidities  frequent falls, COPD, RA, recent covid infection (1/21), dec'd safety awareness    Rehab Potential  Good    PT Frequency  2x / week    PT Duration  8 weeks    PT Treatment/Interventions  ADLs/Self Care Home Management;Gait training;Stair training;Functional mobility training;Therapeutic activities;Therapeutic exercise;Balance training;Neuromuscular re-education;Patient/family education;Vestibular;Manual techniques;DME Instruction;Moist Heat;Passive range of motion    PT Next Visit Plan  spoke to supervising PT; will d/c to HEP.    PT Home Exercise Plan  Access Code: [email or web address]    Consulted and Agree with Plan of Care  Patient       Patient will benefit from skilled therapeutic intervention in order to improve the following deficits and impairments:  Abnormal gait, Difficulty walking, Decreased strength, Postural dysfunction, Impaired  flexibility, Hypomobility, Pain, Decreased activity tolerance, Decreased balance  Visit Diagnosis: Other abnormalities of gait and mobility  Unsteadiness on feet  Muscle weakness (generalized)  History of falling    PHYSICAL THERAPY DISCHARGE SUMMARY  Visits from Start of Care: 13  Current functional level related to goals / functional outcomes: See goals above-- patient met LTGs.   Remaining deficits: Chronic balance and gait instability dec'd endurance with low O2 levels-- patient does not utilize supplemental O2 in the [employer] / [employer] Home program.  Plan: Patient agrees to discharge.  Patient goals were met. Patient is being discharged due to meeting the stated rehab goals.  ?????        Thank you for the referral of this patient. Rudell Cobb, MPT  [place], [place] 09/24/19 1:08 PM  [facility] [place], [place], [address] Phone: [phone]   Fax:  [phone]  Name: [name] MRN: [id number] Date of Birth: 06/09/1935

## 2019-09-28 DIAGNOSIS — H3554 Dystrophies primarily involving the retinal pigment epithelium: Secondary | ICD-10-CM | POA: Diagnosis not present

## 2019-09-28 DIAGNOSIS — Z79899 Other long term (current) drug therapy: Secondary | ICD-10-CM | POA: Diagnosis not present

## 2019-09-28 DIAGNOSIS — H35361 Drusen (degenerative) of macula, right eye: Secondary | ICD-10-CM | POA: Diagnosis not present

## 2019-09-28 DIAGNOSIS — M0689 Other specified rheumatoid arthritis, multiple sites: Secondary | ICD-10-CM | POA: Diagnosis not present

## 2019-09-29 ENCOUNTER — Encounter: Payer: Medicare Other | Admitting: Physical Therapy

## 2019-10-01 ENCOUNTER — Encounter: Payer: Medicare Other | Admitting: Physical Therapy

## 2019-11-09 DIAGNOSIS — M48061 Spinal stenosis, lumbar region without neurogenic claudication: Secondary | ICD-10-CM | POA: Diagnosis not present

## 2019-11-09 DIAGNOSIS — R002 Palpitations: Secondary | ICD-10-CM | POA: Diagnosis not present

## 2019-11-09 DIAGNOSIS — R7309 Other abnormal glucose: Secondary | ICD-10-CM | POA: Diagnosis not present

## 2019-11-09 DIAGNOSIS — J449 Chronic obstructive pulmonary disease, unspecified: Secondary | ICD-10-CM | POA: Diagnosis not present

## 2019-11-09 DIAGNOSIS — R350 Frequency of micturition: Secondary | ICD-10-CM | POA: Diagnosis not present

## 2019-11-09 DIAGNOSIS — K219 Gastro-esophageal reflux disease without esophagitis: Secondary | ICD-10-CM | POA: Diagnosis not present

## 2019-11-09 DIAGNOSIS — F3340 Major depressive disorder, recurrent, in remission, unspecified: Secondary | ICD-10-CM | POA: Diagnosis not present

## 2019-11-09 DIAGNOSIS — E559 Vitamin D deficiency, unspecified: Secondary | ICD-10-CM | POA: Diagnosis not present

## 2019-11-09 DIAGNOSIS — M2550 Pain in unspecified joint: Secondary | ICD-10-CM | POA: Diagnosis not present

## 2019-11-09 DIAGNOSIS — M8580 Other specified disorders of bone density and structure, unspecified site: Secondary | ICD-10-CM | POA: Diagnosis not present

## 2019-11-09 DIAGNOSIS — M0579 Rheumatoid arthritis with rheumatoid factor of multiple sites without organ or systems involvement: Secondary | ICD-10-CM | POA: Diagnosis not present

## 2019-11-09 DIAGNOSIS — E782 Mixed hyperlipidemia: Secondary | ICD-10-CM | POA: Diagnosis not present

## 2019-11-10 DIAGNOSIS — F3340 Major depressive disorder, recurrent, in remission, unspecified: Secondary | ICD-10-CM | POA: Diagnosis not present

## 2019-11-10 DIAGNOSIS — M0579 Rheumatoid arthritis with rheumatoid factor of multiple sites without organ or systems involvement: Secondary | ICD-10-CM | POA: Diagnosis not present

## 2019-11-10 DIAGNOSIS — Z862 Personal history of diseases of the blood and blood-forming organs and certain disorders involving the immune mechanism: Secondary | ICD-10-CM | POA: Diagnosis not present

## 2019-11-10 DIAGNOSIS — R7309 Other abnormal glucose: Secondary | ICD-10-CM | POA: Diagnosis not present

## 2019-11-10 DIAGNOSIS — M8580 Other specified disorders of bone density and structure, unspecified site: Secondary | ICD-10-CM | POA: Diagnosis not present

## 2019-11-10 DIAGNOSIS — E782 Mixed hyperlipidemia: Secondary | ICD-10-CM | POA: Diagnosis not present

## 2019-11-10 DIAGNOSIS — M48061 Spinal stenosis, lumbar region without neurogenic claudication: Secondary | ICD-10-CM | POA: Diagnosis not present

## 2019-11-10 DIAGNOSIS — J449 Chronic obstructive pulmonary disease, unspecified: Secondary | ICD-10-CM | POA: Diagnosis not present

## 2019-11-10 DIAGNOSIS — K219 Gastro-esophageal reflux disease without esophagitis: Secondary | ICD-10-CM | POA: Diagnosis not present

## 2019-11-10 DIAGNOSIS — E673 Hypervitaminosis D: Secondary | ICD-10-CM | POA: Diagnosis not present

## 2019-11-10 DIAGNOSIS — M2550 Pain in unspecified joint: Secondary | ICD-10-CM | POA: Diagnosis not present

## 2019-11-18 ENCOUNTER — Ambulatory Visit: Payer: Medicare Other | Admitting: Cardiology

## 2019-12-13 DIAGNOSIS — Z23 Encounter for immunization: Secondary | ICD-10-CM | POA: Diagnosis not present

## 2019-12-31 ENCOUNTER — Ambulatory Visit: Payer: Medicare Other | Admitting: Cardiology

## 2020-01-04 ENCOUNTER — Encounter: Payer: Self-pay | Admitting: Family Medicine

## 2020-01-07 DIAGNOSIS — M25461 Effusion, right knee: Secondary | ICD-10-CM | POA: Diagnosis not present

## 2020-01-07 DIAGNOSIS — M797 Fibromyalgia: Secondary | ICD-10-CM | POA: Diagnosis not present

## 2020-01-07 DIAGNOSIS — J449 Chronic obstructive pulmonary disease, unspecified: Secondary | ICD-10-CM | POA: Diagnosis not present

## 2020-01-07 DIAGNOSIS — M545 Low back pain: Secondary | ICD-10-CM | POA: Diagnosis not present

## 2020-01-07 DIAGNOSIS — M5412 Radiculopathy, cervical region: Secondary | ICD-10-CM | POA: Diagnosis not present

## 2020-01-07 DIAGNOSIS — M25561 Pain in right knee: Secondary | ICD-10-CM | POA: Diagnosis not present

## 2020-01-07 DIAGNOSIS — Z79899 Other long term (current) drug therapy: Secondary | ICD-10-CM | POA: Diagnosis not present

## 2020-01-07 DIAGNOSIS — M79643 Pain in unspecified hand: Secondary | ICD-10-CM | POA: Diagnosis not present

## 2020-01-07 DIAGNOSIS — M154 Erosive (osteo)arthritis: Secondary | ICD-10-CM | POA: Diagnosis not present

## 2020-01-07 DIAGNOSIS — M150 Primary generalized (osteo)arthritis: Secondary | ICD-10-CM | POA: Diagnosis not present

## 2020-01-07 DIAGNOSIS — M0579 Rheumatoid arthritis with rheumatoid factor of multiple sites without organ or systems involvement: Secondary | ICD-10-CM | POA: Diagnosis not present

## 2020-06-09 DIAGNOSIS — L259 Unspecified contact dermatitis, unspecified cause: Secondary | ICD-10-CM | POA: Diagnosis not present

## 2020-06-09 DIAGNOSIS — F3340 Major depressive disorder, recurrent, in remission, unspecified: Secondary | ICD-10-CM | POA: Diagnosis not present

## 2020-06-09 DIAGNOSIS — R7303 Prediabetes: Secondary | ICD-10-CM | POA: Diagnosis not present

## 2020-06-09 DIAGNOSIS — E782 Mixed hyperlipidemia: Secondary | ICD-10-CM | POA: Diagnosis not present

## 2020-06-09 DIAGNOSIS — E559 Vitamin D deficiency, unspecified: Secondary | ICD-10-CM | POA: Diagnosis not present

## 2020-06-09 DIAGNOSIS — K219 Gastro-esophageal reflux disease without esophagitis: Secondary | ICD-10-CM | POA: Diagnosis not present

## 2020-06-09 DIAGNOSIS — M8580 Other specified disorders of bone density and structure, unspecified site: Secondary | ICD-10-CM | POA: Diagnosis not present

## 2020-06-09 DIAGNOSIS — M0579 Rheumatoid arthritis with rheumatoid factor of multiple sites without organ or systems involvement: Secondary | ICD-10-CM | POA: Diagnosis not present

## 2020-06-09 DIAGNOSIS — M2550 Pain in unspecified joint: Secondary | ICD-10-CM | POA: Diagnosis not present

## 2020-06-09 DIAGNOSIS — M48061 Spinal stenosis, lumbar region without neurogenic claudication: Secondary | ICD-10-CM | POA: Diagnosis not present

## 2020-06-09 DIAGNOSIS — J449 Chronic obstructive pulmonary disease, unspecified: Secondary | ICD-10-CM | POA: Diagnosis not present

## 2020-06-09 DIAGNOSIS — Z862 Personal history of diseases of the blood and blood-forming organs and certain disorders involving the immune mechanism: Secondary | ICD-10-CM | POA: Diagnosis not present

## 2020-08-04 DIAGNOSIS — M5412 Radiculopathy, cervical region: Secondary | ICD-10-CM | POA: Diagnosis not present

## 2020-08-04 DIAGNOSIS — M5459 Other low back pain: Secondary | ICD-10-CM | POA: Diagnosis not present

## 2020-08-04 DIAGNOSIS — M150 Primary generalized (osteo)arthritis: Secondary | ICD-10-CM | POA: Diagnosis not present

## 2020-08-04 DIAGNOSIS — M797 Fibromyalgia: Secondary | ICD-10-CM | POA: Diagnosis not present

## 2020-08-04 DIAGNOSIS — M25461 Effusion, right knee: Secondary | ICD-10-CM | POA: Diagnosis not present

## 2020-08-04 DIAGNOSIS — M154 Erosive (osteo)arthritis: Secondary | ICD-10-CM | POA: Diagnosis not present

## 2020-08-04 DIAGNOSIS — Z79899 Other long term (current) drug therapy: Secondary | ICD-10-CM | POA: Diagnosis not present

## 2020-08-04 DIAGNOSIS — J449 Chronic obstructive pulmonary disease, unspecified: Secondary | ICD-10-CM | POA: Diagnosis not present

## 2020-08-04 DIAGNOSIS — M0579 Rheumatoid arthritis with rheumatoid factor of multiple sites without organ or systems involvement: Secondary | ICD-10-CM | POA: Diagnosis not present

## 2020-08-04 DIAGNOSIS — M25561 Pain in right knee: Secondary | ICD-10-CM | POA: Diagnosis not present

## 2020-08-04 DIAGNOSIS — M79643 Pain in unspecified hand: Secondary | ICD-10-CM | POA: Diagnosis not present

## 2020-09-13 DIAGNOSIS — M25561 Pain in right knee: Secondary | ICD-10-CM | POA: Diagnosis not present

## 2020-09-13 DIAGNOSIS — G8929 Other chronic pain: Secondary | ICD-10-CM | POA: Diagnosis not present

## 2020-09-13 DIAGNOSIS — Z0000 Encounter for general adult medical examination without abnormal findings: Secondary | ICD-10-CM | POA: Diagnosis not present

## 2020-09-13 DIAGNOSIS — Z23 Encounter for immunization: Secondary | ICD-10-CM | POA: Diagnosis not present

## 2020-10-18 ENCOUNTER — Emergency Department (HOSPITAL_COMMUNITY)
Admission: EM | Admit: 2020-10-18 | Discharge: 2020-10-18 | Disposition: A | Discharge status: Home / Self Care | Payer: Medicare Other | Attending: Emergency Medicine | Admitting: Emergency Medicine

## 2020-10-18 ENCOUNTER — Emergency Department (HOSPITAL_COMMUNITY): Payer: Medicare Other

## 2020-10-18 ENCOUNTER — Encounter (HOSPITAL_COMMUNITY): Payer: Self-pay

## 2020-10-18 DIAGNOSIS — Z79899 Other long term (current) drug therapy: Secondary | ICD-10-CM | POA: Diagnosis not present

## 2020-10-18 DIAGNOSIS — S161XXA Strain of muscle, fascia and tendon at neck level, initial encounter: Secondary | ICD-10-CM | POA: Diagnosis not present

## 2020-10-18 DIAGNOSIS — R112 Nausea with vomiting, unspecified: Secondary | ICD-10-CM | POA: Insufficient documentation

## 2020-10-18 DIAGNOSIS — R197 Diarrhea, unspecified: Secondary | ICD-10-CM | POA: Diagnosis not present

## 2020-10-18 DIAGNOSIS — Z8616 Personal history of COVID-19: Secondary | ICD-10-CM | POA: Insufficient documentation

## 2020-10-18 DIAGNOSIS — Z7982 Long term (current) use of aspirin: Secondary | ICD-10-CM | POA: Insufficient documentation

## 2020-10-18 DIAGNOSIS — Z87891 Personal history of nicotine dependence: Secondary | ICD-10-CM | POA: Insufficient documentation

## 2020-10-18 DIAGNOSIS — R059 Cough, unspecified: Secondary | ICD-10-CM | POA: Diagnosis not present

## 2020-10-18 DIAGNOSIS — R519 Headache, unspecified: Secondary | ICD-10-CM | POA: Diagnosis not present

## 2020-10-18 DIAGNOSIS — X58XXXA Exposure to other specified factors, initial encounter: Secondary | ICD-10-CM | POA: Diagnosis not present

## 2020-10-18 DIAGNOSIS — J9811 Atelectasis: Secondary | ICD-10-CM | POA: Diagnosis not present

## 2020-10-18 DIAGNOSIS — M542 Cervicalgia: Secondary | ICD-10-CM | POA: Diagnosis not present

## 2020-10-18 DIAGNOSIS — S199XXA Unspecified injury of neck, initial encounter: Secondary | ICD-10-CM | POA: Diagnosis present

## 2020-10-18 DIAGNOSIS — J441 Chronic obstructive pulmonary disease with (acute) exacerbation: Secondary | ICD-10-CM | POA: Insufficient documentation

## 2020-10-18 DIAGNOSIS — R1110 Vomiting, unspecified: Secondary | ICD-10-CM | POA: Diagnosis not present

## 2020-10-18 LAB — CBC WITH DIFFERENTIAL/PLATELET
Abs Immature Granulocytes: 0.07 10*3/uL (ref 0.00–0.07)
Basophils Absolute: 0 10*3/uL (ref 0.0–0.1)
Basophils Relative: 0 %
Eosinophils Absolute: 0 10*3/uL (ref 0.0–0.5)
Eosinophils Relative: 0 %
HCT: 41.9 % (ref 36.0–46.0)
Hemoglobin: 13.2 g/dL (ref 12.0–15.0)
Immature Granulocytes: 1 %
Lymphocytes Relative: 12 %
Lymphs Abs: 1.5 10*3/uL (ref 0.7–4.0)
MCH: 29.3 pg (ref 26.0–34.0)
MCHC: 31.5 g/dL (ref 30.0–36.0)
MCV: 92.9 fL (ref 80.0–100.0)
Monocytes Absolute: 0.8 10*3/uL (ref 0.1–1.0)
Monocytes Relative: 7 %
Neutro Abs: 10.2 10*3/uL — ABNORMAL HIGH (ref 1.7–7.7)
Neutrophils Relative %: 80 %
Platelets: 366 10*3/uL (ref 150–400)
RBC: 4.51 MIL/uL (ref 3.87–5.11)
RDW: 13.6 % (ref 11.5–15.5)
WBC: 12.6 10*3/uL — ABNORMAL HIGH (ref 4.0–10.5)
nRBC: 0 % (ref 0.0–0.2)

## 2020-10-18 LAB — BASIC METABOLIC PANEL
Anion gap: 10 (ref 5–15)
BUN: 22 mg/dL (ref 8–23)
CO2: 29 mmol/L (ref 22–32)
Calcium: 9 mg/dL (ref 8.9–10.3)
Chloride: 98 mmol/L (ref 98–111)
Creatinine, Ser: 0.94 mg/dL (ref 0.44–1.00)
GFR, Estimated: >60 mL/min (ref >60)
Glucose, Bld: 146 mg/dL — ABNORMAL HIGH (ref 70–99)
Potassium: 3.6 mmol/L (ref 3.5–5.1)
Sodium: 137 mmol/L (ref 135–145)

## 2020-10-18 LAB — LACTIC ACID, PLASMA: Lactic Acid, Venous: 1.4 mmol/L (ref 0.5–1.9)

## 2020-10-18 MED ORDER — METHYLPREDNISOLONE 4 MG PO TBPK: ORAL_TABLET | ORAL | 0 refills | Status: DC

## 2020-10-18 MED ORDER — KETOROLAC TROMETHAMINE 15 MG/ML IJ SOLN
15.0000 mg | Freq: Once | INTRAMUSCULAR | Status: AC
  Administered 2020-10-18: 15 mg via INTRAVENOUS
  Filled 2020-10-18: qty 1

## 2020-10-18 MED ORDER — CYCLOBENZAPRINE HCL 10 MG PO TABS
5.0000 mg | ORAL_TABLET | Freq: Once | ORAL | Status: AC
  Administered 2020-10-18: 5 mg via ORAL
  Filled 2020-10-18: qty 1

## 2020-10-18 MED ORDER — DEXAMETHASONE SODIUM PHOSPHATE 10 MG/ML IJ SOLN
10.0000 mg | Freq: Once | INTRAMUSCULAR | Status: AC
  Administered 2020-10-18: 10 mg via INTRAVENOUS
  Filled 2020-10-18: qty 1

## 2020-10-18 MED ORDER — SODIUM CHLORIDE 0.9 % IV BOLUS
1000.0000 mL | Freq: Once | INTRAVENOUS | Status: AC
  Administered 2020-10-18: 1000 mL via INTRAVENOUS

## 2020-10-18 MED ORDER — CYCLOBENZAPRINE HCL 5 MG PO TABS: 5.0000 mg | ORAL_TABLET | Freq: Two times a day (BID) | ORAL | 0 refills | Status: DC | PRN

## 2020-10-18 NOTE — ED Triage Notes (Signed)
Pt arrived via POV, c/o headache x3 days, pain worsening since. Worse with neck mvmt. Also endorses diarrhea and vomiting.

## 2020-10-18 NOTE — ED Provider Notes (Signed)
[facility] DEPT Provider Note   CSN: [id number] Arrival date & time: 10/18/20  1300     History Chief Complaint  Patient presents with   Headache    [name] is a 85 y.o. female.   Headache Pain location:  Occipital Quality:  Dull Radiates to:  Does not radiate Duration:  6 days Timing:  Intermittent Progression:  Waxing and waning Chronicity:  New Context comment:  Last week had some nausea, vomiting, diarrhea, fever.  Feels dehydrated.  Now with some headaches and neck pain Relieved by:  Nothing Worsened by:  Nothing Associated symptoms: nausea, neck pain and vomiting   Associated symptoms: no abdominal pain, no back pain, no blurred vision, no congestion, no cough, no diarrhea, no dizziness, no drainage, no ear pain, no eye pain, no fever, no neck stiffness, no numbness, no seizures, no sore throat and no weakness       Past Medical History:  Diagnosis Date   ACID REFLUX DISEASE 05/15/2007   Qualifier: Diagnosis of  By: [name] MD, Legrand Como B    Acute and chronic respiratory failure with hypoxia ([place])    Acute on chronic respiratory failure with hypoxia ([place]) 06/08/2018   Acute respiratory failure with hypercapnia (HCC)    Acute respiratory failure with hypoxia ([place]) 07/29/2017   Anemia 05/20/2016   Arthritis    Bladder neoplasm    Body mass index 34.0-34.9, adult    Bronchitis 07/06/2011   Chest pain 07/06/2011   Chronic cough    Chronic respiratory failure ([place])    Community acquired pneumonia    Constipation 07/03/2011   COPD (chronic obstructive pulmonary disease) ([place])    PULMOLOGIST-  DR [name]--  GOLD III W/  CHRONIC RESPIRATORY FAILURE--  O2 DEPENDENT   COPD with acute exacerbation ([place]) 06/29/2011   Coronary arteriosclerosis    Cough productive of purulent sputum    Depression    Dizziness    Dyslipidemia    Falls frequently    Fibromyalgia    GERD (gastroesophageal reflux disease)    Hearing loss in right ear     Hematuria    History of nephritis    as child dx w/ Bright's disease (glomerulonephritis)   HLD (hyperlipidemia) 05/15/2007   Qualifier: Diagnosis of  By: [name] MD, Legrand Como B    Hyperglycemia 05/20/2016   Hyperlipidemia    Hypomagnesemia    Hypoxemia    Iron deficiency anemia, unspecified    Leukopenia    Low vitamin D level    Mild obstructive sleep apnea    no cpap recommendation   Neuropathy 07/29/2017   Neuropathy, peripheral, idiopathic    Osteopenia    Other allergic rhinitis    Pelvic pain in female    Polyarthralgia    PONV (postoperative nausea and vomiting)    Pre-diabetes    Rheumatoid arthritis ([place]) 07/29/2017   Shoulder impingement syndrome 03/25/2015   Sleep disorder, unspecified    SOB (shortness of breath)    Spinal stenosis    Stage 3 severe COPD by GOLD classification ([place]) 06/19/2007   Followed in Pulmonary clinic/ [facility]/ [place]  - hfa 90% 05/15/2012  - PFTs 05/15/2012 FEV1  0.61 ( 36%) ratio 57 and 21% better p B2 and DLCO 44%  106 corrected  - Referred to [facility] 05/15/12 - Rec pulmonary f/u prn 02/11/13     Supplemental oxygen dependent    2L  via Nasal Canula --  activity and nighttime   Tachycardia  Urinary incontinence in female     Patient Active Problem List   Diagnosis Date Noted   Diarrhea 07/16/2019   AKI (acute kidney injury) ([place]) 07/16/2019   Falls frequently 07/16/2019   COVID-19 virus infection 05/16/2019   Community acquired pneumonia    Acute on chronic respiratory failure with hypoxia ([place]) 06/08/2018   Acute respiratory failure with hypercapnia (HCC)    COPD exacerbation ([place]) 07/29/2017   Rheumatoid arthritis ([place]) 07/29/2017   Neuropathy 07/29/2017   Acute respiratory failure with hypoxia ([place]) 07/29/2017   Anemia 05/20/2016   Hyperglycemia 05/20/2016   Shoulder impingement syndrome 03/25/2015   Chronic respiratory failure ([place]) 04/10/2012   Constipation 07/03/2011   COPD with acute exacerbation ([place]) 06/29/2011   Stage 3  severe COPD by GOLD classification ([place]) 06/19/2007   HLD (hyperlipidemia) 05/15/2007   ACID REFLUX DISEASE 05/15/2007    Past Surgical History:  Procedure Laterality Date   [place]  05-08-2007  dr Marlou Porch   minor coronary plaquing w/ no significant CAD/  20% mLAD,  10% mRCA,  perserved LV, ef 60-65%   CATARACT EXTRACTION W/ INTRAOCULAR LENS  IMPLANT, BILATERAL  2001 approx   CHOLECYSTECTOMY  1988   CYSTOSCOPY W/ RETROGRADES Bilateral 09/24/2014   Procedure: CYSTOSCOPY WITH RETROGRADE PYELOGRAM;  Surgeon: Festus Aloe, MD;  Location[facility]  Service: Urology;  Laterality: Bilateral;   CYSTOSCOPY WITH BIOPSY N/A 09/24/2014   Procedure: CYSTOSCOPY WITH BIOPSY;  Surgeon: Festus Aloe, MD;  Location[facility];  Service: Urology;  Laterality: N/A;   ESOPHAGOGASTRODUODENOSCOPY  last one 09-09-2008   FULGURATION OF BLADDER TUMOR N/A 09/24/2014   Procedure: FULGURATION OF BLADDER TUMOR;  Surgeon: Festus Aloe, MD;  Location: [facility];  Service: Urology;  Laterality: N/A;   [place] LUMBAR FUSION  10-07-2007   bilateral laminectomy L4 and bilateral L4-5 diskectomy w/ fusion   TONSILLECTOMY  as child   TOTAL ABDOMINAL HYSTERECTOMY W/ BILATERAL SALPINGOOPHORECTOMY  1973   TRANSTHORACIC ECHOCARDIOGRAM  07-07-2011   normal echo,  ef 65-70%     OB History   No obstetric history on file.     Family History  Problem Relation Age of Onset   Asthma Maternal Grandmother    Breast cancer Maternal Grandmother     Social History   Tobacco Use   Smoking status: Former    Packs/day: 3.00    Years: 60.00    Pack years: 180.00    Types: Cigarettes    Quit date: 06/25/2011    Years since quitting: 9.3   Smokeless tobacco: Never  Vaping Use   Vaping Use: Never used  Substance Use Topics   Alcohol use: No   Drug use: No    Home Medications Prior to Admission  medications   Medication Sig Start Date End Date Taking? Authorizing Provider  cyclobenzaprine (FLEXERIL) 5 MG tablet Take 1 tablet (5 mg total) by mouth 2 (two) times daily as needed for up to 10 doses for muscle spasms. 10/18/20  Yes Ezeriah Luty, DO  methylPREDNISolone (MEDROL DOSEPAK) 4 MG TBPK tablet Follow package insert 10/18/20  Yes Gabriell Casimir, DO  acetaminophen (TYLENOL) 325 MG tablet Take 2 tablets (650 mg total) by mouth every 6 (six) hours as needed for mild pain (or Fever >/= 101). Patient not taking: Reported on 09/16/2019 08/02/17   Roxan Hockey, MD  acetaminophen (TYLENOL) 650 MG CR tablet Take 1,300 mg by mouth every  8 (eight) hours as needed for pain.    [provider]  albuterol (PROVENTIL) (2.5 MG/3ML) 0.083% nebulizer solution Take 3 mLs (2.5 mg total) by nebulization every 4 (four) hours as needed for wheezing or shortness of breath. For shortness of breath. 07/28/17   Roxan Hockey, MD  aspirin EC 81 MG tablet Take 81 mg by mouth daily.    [provider]  atorvastatin (LIPITOR) 40 MG tablet Take 40 mg by mouth every morning.     [provider]  azelastine (ASTELIN) 0.1 % nasal spray Place 1 spray into the nose 2 (two) times daily as needed for allergies.     [provider]  budesonide-formoterol (SYMBICORT) 160-4.5 MCG/ACT inhaler Inhale 2 puffs into the lungs 2 (two) times daily. Patient not taking: Reported on 08/13/2019 08/02/17   Roxan Hockey, MD  calcium-vitamin D (OSCAL WITH D) 500-200 MG-UNIT tablet Take 2 tablets by mouth daily with breakfast. 06/11/18   Georgette Shell, MD  carboxymethylcellulose (REFRESH PLUS) 0.5 % SOLN 2 drops 2 (two) times daily as needed (dry eye).    [provider]  cetirizine (ZYRTEC) 10 MG tablet Take 1 tablet (10 mg total) by mouth every morning. 08/02/17   Roxan Hockey, MD  Cholecalciferol (VITAMIN D) 2000 UNITS tablet Take 4,000 Units by mouth daily.     [provider]  cyanocobalamin 1000 MCG tablet Take 1 tablet (1,000 mcg total) by mouth daily. 08/02/17   Roxan Hockey, MD  esomeprazole (NEXIUM) 40 MG capsule Take 1 capsule (40 mg total) by mouth every morning. 08/02/17   Roxan Hockey, MD  folic acid (FOLVITE) 1 MG tablet Take 1 tablet (1 mg total) by mouth daily. 08/02/17   Roxan Hockey, MD  gabapentin (NEURONTIN) 100 MG capsule Take 2-3 capsules (200-300 mg total) by mouth 3 (three) times daily. 200 mg every AM and at noon and 300 mg QHS 08/02/17   Roxan Hockey, MD  hydroxychloroquine (PLAQUENIL) 200 MG tablet Take 200 mg by mouth daily.    [provider]  ipratropium-albuterol (DUONEB) 0.5-2.5 (3) MG/3ML SOLN Take 3 mLs by nebulization 2 (two) times daily.    [provider]  meloxicam (MOBIC) 15 MG tablet Take 15 mg by mouth daily. 04/24/18   [provider]  mupirocin ointment (BACTROBAN) 2 % Place 1 application into the nose 2 (two) times daily. 06/10/18   Georgette Shell, MD  polyethylene glycol [facility] / Floria Raveling) packet Take 17 g by mouth daily. Patient taking differently: Take 17 g by mouth daily as needed for moderate constipation.  08/03/17   Roxan Hockey, MD  polyvinyl alcohol (LIQUIFILM TEARS) 1.4 % ophthalmic solution Place 2 drops into both eyes as needed for dry eyes. 08/02/17   Roxan Hockey, MD  sertraline (ZOLOFT) 100 MG tablet Take 100 mg by mouth daily.    [provider]  tiotropium (SPIRIVA) 18 MCG inhalation capsule Place 1 capsule (18 mcg total) into inhaler and inhale every morning. Patient not taking: Reported on 09/16/2019 08/02/17   Roxan Hockey, MD  TRELEGY ELLIPTA 100-62.5-25 MCG/INH AEPB Take 1 puff by mouth daily. 06/30/19   [provider]    Allergies    Kapidex [dexlansoprazole] and Zegerid [omeprazole]  Review of Systems   Review of Systems  Constitutional:  Negative for chills and fever.  HENT:  Negative for congestion, ear pain, postnasal drip and  sore throat.   Eyes:  Negative for blurred vision, pain and visual disturbance.  Respiratory:  Negative for cough and shortness of breath.   Cardiovascular:  Negative for chest pain and palpitations.  Gastrointestinal:  Positive for nausea and vomiting. Negative for abdominal pain and diarrhea.  Genitourinary:  Negative for dysuria and hematuria.  Musculoskeletal:  Positive for neck pain. Negative for arthralgias, back pain, gait problem and neck stiffness.  Skin:  Negative for color change and rash.  Neurological:  Positive for headaches. Negative for dizziness, tremors, seizures, syncope, facial asymmetry, speech difficulty, weakness, light-headedness and numbness.  All other systems reviewed and are negative.  Physical Exam Updated Vital Signs BP (!) 164/111 (BP Location: Left Arm)   Pulse 60   Temp 97.7 F (36.5 C) (Oral)   Resp 16   SpO2 95%   Physical Exam Vitals and nursing note reviewed.  Constitutional:      General: She is not in acute distress.    Appearance: She is well-developed. She is not ill-appearing.  HENT:     Head: Normocephalic and atraumatic.     Mouth/Throat:     Mouth: Mucous membranes are moist.  Eyes:     General: No visual field deficit.    Extraocular Movements: Extraocular movements intact.     Conjunctiva/sclera: Conjunctivae normal.     Pupils: Pupils are equal, round, and reactive to light.  Neck:     Comments: No midline spinal tenderness, tenderness to paraspinal cervical muscles bilaterally in the low spine Cardiovascular:     Rate and Rhythm: Normal rate and regular rhythm.     Pulses: Normal pulses.     Heart sounds: Normal heart sounds. No murmur heard. Pulmonary:     Effort: Pulmonary effort is normal. No respiratory distress.     Breath sounds: Normal breath sounds.  Abdominal:     Palpations: Abdomen is soft.     Tenderness: There is no abdominal tenderness.  Musculoskeletal:        General: No swelling. Normal range of motion.      Cervical back: Normal range of motion and neck supple.  Skin:    General: Skin is warm and dry.     Capillary Refill: Capillary refill takes less than 2 seconds.  Neurological:     General: No focal deficit present.     Mental Status: She is alert and oriented to person, place, and time.     Cranial Nerves: No cranial nerve deficit, dysarthria or facial asymmetry.     Sensory: No sensory deficit.     Motor: No weakness.     Coordination: Coordination normal.     Gait: Gait normal.     Comments: 5+ out of 5 strength throughout, normal sensation, no drift, normal finger-nose-finger, normal speech    ED Results / Procedures / Treatments   Labs (all labs ordered are listed, but only abnormal results are displayed) Labs Reviewed  CBC WITH DIFFERENTIAL/PLATELET - Abnormal; Notable for the following components:      Result Value   WBC 12.6 (*)    Neutro Abs 10.2 (*)    All other components within normal limits  BASIC METABOLIC PANEL - Abnormal; Notable for the following components:   Glucose, Bld 146 (*)    All other components within normal limits  LACTIC ACID, PLASMA    EKG None  Radiology CT Head Wo Contrast  Result Date: 10/18/2020 CLINICAL DATA:  Headache and neck pain EXAM: CT HEAD WITHOUT CONTRAST CT CERVICAL SPINE WITHOUT CONTRAST TECHNIQUE: Multidetector CT imaging of the head and cervical spine was performed following  the standard protocol without intravenous contrast. Multiplanar CT image reconstructions of the cervical spine were also generated. COMPARISON:  CT head 05/20/2006 FINDINGS: CT HEAD FINDINGS Brain: Generalized atrophy with progression. Patchy white matter hypodensity bilaterally with progression. Negative for acute infarct, hemorrhage, mass Vascular: Negative for hyperdense vessel Skull: Negative Sinuses/Orbits: Paranasal sinuses clear. Bilateral cataract extraction. Other: None CT CERVICAL SPINE FINDINGS Alignment: Anterolisthesis C3-4 and C4-5.  Straightening of the cervical lordosis Skull base and vertebrae: Negative for acute fracture. Superior endplate deformities of T1 and T2 appear chronic with sclerotic margins. Soft tissues and spinal canal: Negative Disc levels: Multilevel disc and facet degeneration and spurring. Foraminal encroachment at multiple levels due to spurring. Mild spinal stenosis at C5-6 and C6-7 due to spurring. Upper chest: Lung apices clear bilaterally. Other: None IMPRESSION: 1. No acute intracranial abnormality. Progression of atrophy and chronic microvascular ischemia since 2008 2. Cervical spondylosis.  Negative for acute fracture. Electronically Signed   By: Franchot Gallo M.D.   On: 10/18/2020 15:30   CT Cervical Spine Wo Contrast  Result Date: 10/18/2020 CLINICAL DATA:  Headache and neck pain EXAM: CT HEAD WITHOUT CONTRAST CT CERVICAL SPINE WITHOUT CONTRAST TECHNIQUE: Multidetector CT imaging of the head and cervical spine was performed following the standard protocol without intravenous contrast. Multiplanar CT image reconstructions of the cervical spine were also generated. COMPARISON:  CT head 05/20/2006 FINDINGS: CT HEAD FINDINGS Brain: Generalized atrophy with progression. Patchy white matter hypodensity bilaterally with progression. Negative for acute infarct, hemorrhage, mass Vascular: Negative for hyperdense vessel Skull: Negative Sinuses/Orbits: Paranasal sinuses clear. Bilateral cataract extraction. Other: None CT CERVICAL SPINE FINDINGS Alignment: Anterolisthesis C3-4 and C4-5. Straightening of the cervical lordosis Skull base and vertebrae: Negative for acute fracture. Superior endplate deformities of T1 and T2 appear chronic with sclerotic margins. Soft tissues and spinal canal: Negative Disc levels: Multilevel disc and facet degeneration and spurring. Foraminal encroachment at multiple levels due to spurring. Mild spinal stenosis at C5-6 and C6-7 due to spurring. Upper chest: Lung apices clear bilaterally.  Other: None IMPRESSION: 1. No acute intracranial abnormality. Progression of atrophy and chronic microvascular ischemia since 2008 2. Cervical spondylosis.  Negative for acute fracture. Electronically Signed   By: Franchot Gallo M.D.   On: 10/18/2020 15:30   DG Chest Portable 1 View  Result Date: 10/18/2020 CLINICAL DATA:  Cough headache.  Diarrhea and vomiting. EXAM: PORTABLE CHEST 1 VIEW COMPARISON:  01/31/2020. FINDINGS: Mediastinum hilar structures normal. Heart size normal. Mild elevation left hemidiaphragm again noted. Mild left base subsegmental atelectasis and pleuroparenchymal thickening consistent scarring. No acute infiltrate. No pleural effusion or pneumothorax. IMPRESSION: Mild elevation left hemidiaphragm again noted. Mild left base subsegmental atelectasis and pleuroparenchymal thickening consistent scarring. No acute infiltrate. Electronically Signed   By: Marcello Moores  Register   On: 10/18/2020 13:45    Procedures Procedures   Medications Ordered in ED Medications  sodium chloride 0.9 % bolus 1,000 mL (0 mLs Intravenous Stopped 10/18/20 1802)  dexamethasone (DECADRON) injection 10 mg (10 mg Intravenous Given 10/18/20 1644)  cyclobenzaprine (FLEXERIL) tablet 5 mg (5 mg Oral Given 10/18/20 1646)  ketorolac (TORADOL) 15 MG/ML injection 15 mg (15 mg Intravenous Given 10/18/20 1646)    ED Course  I have reviewed the triage vital signs and the nursing notes.  Pertinent labs & imaging results that were available during my care of the patient were reviewed by me and considered in my medical decision making (see chart for details).    MDM Rules/Calculators/A&P  [name] is here with headache, neck stiffness and pain for last 2 days.  Has some nausea, vomiting, diarrhea last week maybe a low-grade fever that has now resolved.  She feels dehydrated and some muscle tension.  Denies any numbness or weakness to her upper extremities.  Neurologically she is intact.   Lab work shows no significant anemia, electrolyte abnormality, kidney injury.  She does have a history of arthritis.  CT scan of head and neck are overall unremarkable.  She does have some arthritic changes in the C-spine.  She was given headache cocktail with great improvement.  Will prescribe Medrol Dosepak and Flexeril as overall suspect that this is more muscular in nature and arthritic in nature.  Recommend that she continue to take meloxicam and Tylenol at home.  Recommend follow-up with primary care doctor.  Overall no concern for infectious process or acute cord compression.  Discharged in good condition.  This chart was dictated using voice recognition software.  Despite best efforts to proofread,  errors can occur which can change the documentation meaning.   Final Clinical Impression(s) / ED Diagnoses Final diagnoses:  Nonintractable headache, unspecified chronicity pattern, unspecified headache type  Strain of neck muscle, initial encounter    Rx / DC Orders ED Discharge Orders          Ordered    methylPREDNISolone (MEDROL DOSEPAK) 4 MG TBPK tablet        10/18/20 1826    cyclobenzaprine (FLEXERIL) 5 MG tablet  2 times daily PRN        10/18/20 1826             Lennice Sites, DO 10/18/20 1828

## 2020-10-18 NOTE — ED Provider Notes (Signed)
Emergency Medicine Provider Triage Evaluation Note  [name] , a 85 y.o. female  was evaluated in triage.  Pt complains of headache and neck pain.  Patient states that she started to develop fevers, congestion on Friday and severe headache.  She states that the fevers and congestion have improved, however she still has pain in the lower head and significant pain with moving her neck.  She denies any numbness or tingling going down her extremities.  She has been taking Tylenol for pain.  She reports that she was not recently tested for COVID, states she has had COVID in the past.  Review of Systems  Positive: As above Negative: As above  Physical Exam  BP (!) 149/76 (BP Location: Left Arm)   Pulse 72   Temp 97.7 F (36.5 C) (Oral)   Resp 16   SpO2 95%  Gen:   Awake, no distress   Resp:  Normal effort  MSK:   Moves extremities without difficulty, sitting up in the wheelchair, alert, nontoxic, with some restriction in flexion and extension of the neck.  Some midline tenderness of the cervical spine. Other:    Medical Decision Making  Medically screening exam initiated at 1:28 PM.  Appropriate orders placed.  [name] was informed that the remainder of the evaluation will be completed by another provider, this initial triage assessment does not replace that evaluation, and the importance of remaining in the ED until their evaluation is complete.  Discussed the case with Dr. Billy Fischer, start with basic labs, CT of the head and neck and low suspicion for subarachnoid hemorrhage given gradual progression of the headache   Garald Balding, PA-C 10/18/20 1330    Lucrezia Starch, MD 10/19/20 1032

## 2020-10-18 NOTE — Discharge Instructions (Addendum)
Take medication as prescribed.  Recommend 1000 mg of Tylenol every 6 hours as needed for pain as well.  Follow-up with your primary care doctor.

## 2020-11-09 DIAGNOSIS — F3340 Major depressive disorder, recurrent, in remission, unspecified: Secondary | ICD-10-CM | POA: Diagnosis not present

## 2020-11-09 DIAGNOSIS — J449 Chronic obstructive pulmonary disease, unspecified: Secondary | ICD-10-CM | POA: Diagnosis not present

## 2020-11-09 DIAGNOSIS — M0579 Rheumatoid arthritis with rheumatoid factor of multiple sites without organ or systems involvement: Secondary | ICD-10-CM | POA: Diagnosis not present

## 2020-11-09 DIAGNOSIS — K219 Gastro-esophageal reflux disease without esophagitis: Secondary | ICD-10-CM | POA: Diagnosis not present

## 2020-11-09 DIAGNOSIS — E782 Mixed hyperlipidemia: Secondary | ICD-10-CM | POA: Diagnosis not present

## 2020-11-09 DIAGNOSIS — M179 Osteoarthritis of knee, unspecified: Secondary | ICD-10-CM | POA: Diagnosis not present

## 2020-11-09 DIAGNOSIS — G8929 Other chronic pain: Secondary | ICD-10-CM | POA: Diagnosis not present

## 2020-11-09 DIAGNOSIS — E785 Hyperlipidemia, unspecified: Secondary | ICD-10-CM | POA: Diagnosis not present

## 2020-11-09 DIAGNOSIS — M8580 Other specified disorders of bone density and structure, unspecified site: Secondary | ICD-10-CM | POA: Diagnosis not present

## 2020-11-09 DIAGNOSIS — D509 Iron deficiency anemia, unspecified: Secondary | ICD-10-CM | POA: Diagnosis not present

## 2020-11-09 DIAGNOSIS — J441 Chronic obstructive pulmonary disease with (acute) exacerbation: Secondary | ICD-10-CM | POA: Diagnosis not present

## 2020-11-14 DIAGNOSIS — M5412 Radiculopathy, cervical region: Secondary | ICD-10-CM | POA: Diagnosis not present

## 2020-11-14 DIAGNOSIS — M5459 Other low back pain: Secondary | ICD-10-CM | POA: Diagnosis not present

## 2020-11-14 DIAGNOSIS — M154 Erosive (osteo)arthritis: Secondary | ICD-10-CM | POA: Diagnosis not present

## 2020-11-14 DIAGNOSIS — M0579 Rheumatoid arthritis with rheumatoid factor of multiple sites without organ or systems involvement: Secondary | ICD-10-CM | POA: Diagnosis not present

## 2020-11-14 DIAGNOSIS — M150 Primary generalized (osteo)arthritis: Secondary | ICD-10-CM | POA: Diagnosis not present

## 2020-11-14 DIAGNOSIS — M25561 Pain in right knee: Secondary | ICD-10-CM | POA: Diagnosis not present

## 2020-11-14 DIAGNOSIS — Z79899 Other long term (current) drug therapy: Secondary | ICD-10-CM | POA: Diagnosis not present

## 2020-11-14 DIAGNOSIS — M25461 Effusion, right knee: Secondary | ICD-10-CM | POA: Diagnosis not present

## 2020-11-14 DIAGNOSIS — J449 Chronic obstructive pulmonary disease, unspecified: Secondary | ICD-10-CM | POA: Diagnosis not present

## 2020-11-14 DIAGNOSIS — M797 Fibromyalgia: Secondary | ICD-10-CM | POA: Diagnosis not present

## 2020-12-13 DIAGNOSIS — Z862 Personal history of diseases of the blood and blood-forming organs and certain disorders involving the immune mechanism: Secondary | ICD-10-CM | POA: Diagnosis not present

## 2020-12-13 DIAGNOSIS — Z79899 Other long term (current) drug therapy: Secondary | ICD-10-CM | POA: Diagnosis not present

## 2020-12-13 DIAGNOSIS — M2550 Pain in unspecified joint: Secondary | ICD-10-CM | POA: Diagnosis not present

## 2020-12-13 DIAGNOSIS — M0579 Rheumatoid arthritis with rheumatoid factor of multiple sites without organ or systems involvement: Secondary | ICD-10-CM | POA: Diagnosis not present

## 2020-12-13 DIAGNOSIS — K219 Gastro-esophageal reflux disease without esophagitis: Secondary | ICD-10-CM | POA: Diagnosis not present

## 2020-12-13 DIAGNOSIS — L259 Unspecified contact dermatitis, unspecified cause: Secondary | ICD-10-CM | POA: Diagnosis not present

## 2020-12-13 DIAGNOSIS — E782 Mixed hyperlipidemia: Secondary | ICD-10-CM | POA: Diagnosis not present

## 2020-12-13 DIAGNOSIS — R7309 Other abnormal glucose: Secondary | ICD-10-CM | POA: Diagnosis not present

## 2020-12-13 DIAGNOSIS — E673 Hypervitaminosis D: Secondary | ICD-10-CM | POA: Diagnosis not present

## 2020-12-13 DIAGNOSIS — M8580 Other specified disorders of bone density and structure, unspecified site: Secondary | ICD-10-CM | POA: Diagnosis not present

## 2020-12-13 DIAGNOSIS — J449 Chronic obstructive pulmonary disease, unspecified: Secondary | ICD-10-CM | POA: Diagnosis not present

## 2020-12-13 DIAGNOSIS — M48061 Spinal stenosis, lumbar region without neurogenic claudication: Secondary | ICD-10-CM | POA: Diagnosis not present

## 2020-12-13 DIAGNOSIS — F3340 Major depressive disorder, recurrent, in remission, unspecified: Secondary | ICD-10-CM | POA: Diagnosis not present

## 2020-12-15 DIAGNOSIS — F3340 Major depressive disorder, recurrent, in remission, unspecified: Secondary | ICD-10-CM | POA: Diagnosis not present

## 2020-12-15 DIAGNOSIS — M0579 Rheumatoid arthritis with rheumatoid factor of multiple sites without organ or systems involvement: Secondary | ICD-10-CM | POA: Diagnosis not present

## 2020-12-15 DIAGNOSIS — E785 Hyperlipidemia, unspecified: Secondary | ICD-10-CM | POA: Diagnosis not present

## 2020-12-15 DIAGNOSIS — G8929 Other chronic pain: Secondary | ICD-10-CM | POA: Diagnosis not present

## 2020-12-15 DIAGNOSIS — K219 Gastro-esophageal reflux disease without esophagitis: Secondary | ICD-10-CM | POA: Diagnosis not present

## 2020-12-15 DIAGNOSIS — J449 Chronic obstructive pulmonary disease, unspecified: Secondary | ICD-10-CM | POA: Diagnosis not present

## 2020-12-15 DIAGNOSIS — M8580 Other specified disorders of bone density and structure, unspecified site: Secondary | ICD-10-CM | POA: Diagnosis not present

## 2020-12-15 DIAGNOSIS — J441 Chronic obstructive pulmonary disease with (acute) exacerbation: Secondary | ICD-10-CM | POA: Diagnosis not present

## 2020-12-15 DIAGNOSIS — M159 Polyosteoarthritis, unspecified: Secondary | ICD-10-CM | POA: Diagnosis not present

## 2020-12-15 DIAGNOSIS — E782 Mixed hyperlipidemia: Secondary | ICD-10-CM | POA: Diagnosis not present

## 2020-12-16 DIAGNOSIS — M159 Polyosteoarthritis, unspecified: Secondary | ICD-10-CM | POA: Diagnosis not present

## 2020-12-16 DIAGNOSIS — L259 Unspecified contact dermatitis, unspecified cause: Secondary | ICD-10-CM | POA: Diagnosis not present

## 2020-12-16 DIAGNOSIS — M0579 Rheumatoid arthritis with rheumatoid factor of multiple sites without organ or systems involvement: Secondary | ICD-10-CM | POA: Diagnosis not present

## 2020-12-16 DIAGNOSIS — E782 Mixed hyperlipidemia: Secondary | ICD-10-CM | POA: Diagnosis not present

## 2020-12-16 DIAGNOSIS — M48061 Spinal stenosis, lumbar region without neurogenic claudication: Secondary | ICD-10-CM | POA: Diagnosis not present

## 2020-12-16 DIAGNOSIS — J449 Chronic obstructive pulmonary disease, unspecified: Secondary | ICD-10-CM | POA: Diagnosis not present

## 2020-12-16 DIAGNOSIS — R296 Repeated falls: Secondary | ICD-10-CM | POA: Diagnosis not present

## 2020-12-16 DIAGNOSIS — F3340 Major depressive disorder, recurrent, in remission, unspecified: Secondary | ICD-10-CM | POA: Diagnosis not present

## 2020-12-16 DIAGNOSIS — M85852 Other specified disorders of bone density and structure, left thigh: Secondary | ICD-10-CM | POA: Diagnosis not present

## 2020-12-16 DIAGNOSIS — E538 Deficiency of other specified B group vitamins: Secondary | ICD-10-CM | POA: Diagnosis not present

## 2020-12-16 DIAGNOSIS — K219 Gastro-esophageal reflux disease without esophagitis: Secondary | ICD-10-CM | POA: Diagnosis not present

## 2021-03-21 DIAGNOSIS — J449 Chronic obstructive pulmonary disease, unspecified: Secondary | ICD-10-CM | POA: Diagnosis not present

## 2021-03-21 DIAGNOSIS — Z23 Encounter for immunization: Secondary | ICD-10-CM | POA: Diagnosis not present

## 2021-03-21 DIAGNOSIS — M25561 Pain in right knee: Secondary | ICD-10-CM | POA: Diagnosis not present

## 2021-03-21 DIAGNOSIS — M0579 Rheumatoid arthritis with rheumatoid factor of multiple sites without organ or systems involvement: Secondary | ICD-10-CM | POA: Diagnosis not present

## 2021-03-21 DIAGNOSIS — M797 Fibromyalgia: Secondary | ICD-10-CM | POA: Diagnosis not present

## 2021-03-21 DIAGNOSIS — M25461 Effusion, right knee: Secondary | ICD-10-CM | POA: Diagnosis not present

## 2021-03-21 DIAGNOSIS — M5459 Other low back pain: Secondary | ICD-10-CM | POA: Diagnosis not present

## 2021-03-21 DIAGNOSIS — M150 Primary generalized (osteo)arthritis: Secondary | ICD-10-CM | POA: Diagnosis not present

## 2021-03-21 DIAGNOSIS — M5412 Radiculopathy, cervical region: Secondary | ICD-10-CM | POA: Diagnosis not present

## 2021-03-21 DIAGNOSIS — Z79899 Other long term (current) drug therapy: Secondary | ICD-10-CM | POA: Diagnosis not present

## 2021-03-21 DIAGNOSIS — M154 Erosive (osteo)arthritis: Secondary | ICD-10-CM | POA: Diagnosis not present

## 2021-05-11 ENCOUNTER — Other Ambulatory Visit: Payer: Self-pay

## 2021-05-11 ENCOUNTER — Emergency Department (HOSPITAL_COMMUNITY): Payer: Medicare Other

## 2021-05-11 ENCOUNTER — Encounter (HOSPITAL_COMMUNITY): Payer: Self-pay | Admitting: Emergency Medicine

## 2021-05-11 ENCOUNTER — Inpatient Hospital Stay (HOSPITAL_COMMUNITY)
Admission: EM | Admit: 2021-05-11 | Discharge: 2021-05-18 | DRG: 190 | Disposition: A | Discharge status: Home / Self Care | Payer: Medicare Other | Attending: Internal Medicine | Admitting: Internal Medicine

## 2021-05-11 DIAGNOSIS — H9191 Unspecified hearing loss, right ear: Secondary | ICD-10-CM | POA: Diagnosis present

## 2021-05-11 DIAGNOSIS — Z7952 Long term (current) use of systemic steroids: Secondary | ICD-10-CM | POA: Diagnosis not present

## 2021-05-11 DIAGNOSIS — F064 Anxiety disorder due to known physiological condition: Secondary | ICD-10-CM | POA: Diagnosis present

## 2021-05-11 DIAGNOSIS — R059 Cough, unspecified: Secondary | ICD-10-CM | POA: Diagnosis not present

## 2021-05-11 DIAGNOSIS — M8580 Other specified disorders of bone density and structure, unspecified site: Secondary | ICD-10-CM | POA: Diagnosis present

## 2021-05-11 DIAGNOSIS — Z20822 Contact with and (suspected) exposure to covid-19: Secondary | ICD-10-CM | POA: Diagnosis present

## 2021-05-11 DIAGNOSIS — M1990 Unspecified osteoarthritis, unspecified site: Secondary | ICD-10-CM | POA: Diagnosis present

## 2021-05-11 DIAGNOSIS — J9611 Chronic respiratory failure with hypoxia: Secondary | ICD-10-CM | POA: Diagnosis present

## 2021-05-11 DIAGNOSIS — Z825 Family history of asthma and other chronic lower respiratory diseases: Secondary | ICD-10-CM

## 2021-05-11 DIAGNOSIS — M069 Rheumatoid arthritis, unspecified: Secondary | ICD-10-CM | POA: Diagnosis present

## 2021-05-11 DIAGNOSIS — M797 Fibromyalgia: Secondary | ICD-10-CM | POA: Diagnosis present

## 2021-05-11 DIAGNOSIS — R0602 Shortness of breath: Secondary | ICD-10-CM | POA: Diagnosis not present

## 2021-05-11 DIAGNOSIS — Z9981 Dependence on supplemental oxygen: Secondary | ICD-10-CM

## 2021-05-11 DIAGNOSIS — Z90710 Acquired absence of both cervix and uterus: Secondary | ICD-10-CM

## 2021-05-11 DIAGNOSIS — Z803 Family history of malignant neoplasm of breast: Secondary | ICD-10-CM

## 2021-05-11 DIAGNOSIS — I2510 Atherosclerotic heart disease of native coronary artery without angina pectoris: Secondary | ICD-10-CM | POA: Diagnosis present

## 2021-05-11 DIAGNOSIS — Z888 Allergy status to other drugs, medicaments and biological substances status: Secondary | ICD-10-CM

## 2021-05-11 DIAGNOSIS — K219 Gastro-esophageal reflux disease without esophagitis: Secondary | ICD-10-CM | POA: Diagnosis present

## 2021-05-11 DIAGNOSIS — J440 Chronic obstructive pulmonary disease with acute lower respiratory infection: Secondary | ICD-10-CM | POA: Diagnosis present

## 2021-05-11 DIAGNOSIS — G609 Hereditary and idiopathic neuropathy, unspecified: Secondary | ICD-10-CM | POA: Diagnosis present

## 2021-05-11 DIAGNOSIS — J189 Pneumonia, unspecified organism: Secondary | ICD-10-CM | POA: Diagnosis not present

## 2021-05-11 DIAGNOSIS — J441 Chronic obstructive pulmonary disease with (acute) exacerbation: Principal | ICD-10-CM | POA: Diagnosis present

## 2021-05-11 DIAGNOSIS — E785 Hyperlipidemia, unspecified: Secondary | ICD-10-CM | POA: Diagnosis present

## 2021-05-11 DIAGNOSIS — F32A Depression, unspecified: Secondary | ICD-10-CM | POA: Diagnosis present

## 2021-05-11 DIAGNOSIS — J439 Emphysema, unspecified: Secondary | ICD-10-CM | POA: Diagnosis not present

## 2021-05-11 DIAGNOSIS — G4733 Obstructive sleep apnea (adult) (pediatric): Secondary | ICD-10-CM | POA: Diagnosis present

## 2021-05-11 DIAGNOSIS — R000 Tachycardia, unspecified: Secondary | ICD-10-CM | POA: Diagnosis not present

## 2021-05-11 DIAGNOSIS — Z79899 Other long term (current) drug therapy: Secondary | ICD-10-CM

## 2021-05-11 LAB — CBC WITH DIFFERENTIAL/PLATELET
Abs Immature Granulocytes: 0.05 10*3/uL (ref 0.00–0.07)
Basophils Absolute: 0 10*3/uL (ref 0.0–0.1)
Basophils Relative: 0 %
Eosinophils Absolute: 0 10*3/uL (ref 0.0–0.5)
Eosinophils Relative: 0 %
HCT: 42.8 % (ref 36.0–46.0)
Hemoglobin: 13.3 g/dL (ref 12.0–15.0)
Immature Granulocytes: 0 %
Lymphocytes Relative: 6 %
Lymphs Abs: 0.7 10*3/uL (ref 0.7–4.0)
MCH: 28.7 pg (ref 26.0–34.0)
MCHC: 31.1 g/dL (ref 30.0–36.0)
MCV: 92.2 fL (ref 80.0–100.0)
Monocytes Absolute: 0.3 10*3/uL (ref 0.1–1.0)
Monocytes Relative: 3 %
Neutro Abs: 10.9 10*3/uL — ABNORMAL HIGH (ref 1.7–7.7)
Neutrophils Relative %: 91 %
Platelets: 424 10*3/uL — ABNORMAL HIGH (ref 150–400)
RBC: 4.64 MIL/uL (ref 3.87–5.11)
RDW: 13.3 % (ref 11.5–15.5)
WBC: 12 10*3/uL — ABNORMAL HIGH (ref 4.0–10.5)
nRBC: 0 % (ref 0.0–0.2)

## 2021-05-11 LAB — RESP PANEL BY RT-PCR (FLU A&B, COVID) ARPGX2
Influenza A by PCR: NEGATIVE
Influenza B by PCR: NEGATIVE
SARS Coronavirus 2 by RT PCR: NEGATIVE

## 2021-05-11 LAB — CREATININE, SERUM
Creatinine, Ser: 0.77 mg/dL (ref 0.44–1.00)
GFR, Estimated: >60 mL/min (ref >60)

## 2021-05-11 LAB — CBC
HCT: 39.1 % (ref 36.0–46.0)
Hemoglobin: 12.5 g/dL (ref 12.0–15.0)
MCH: 29.5 pg (ref 26.0–34.0)
MCHC: 32 g/dL (ref 30.0–36.0)
MCV: 92.2 fL (ref 80.0–100.0)
Platelets: 371 10*3/uL (ref 150–400)
RBC: 4.24 MIL/uL (ref 3.87–5.11)
RDW: 13.2 % (ref 11.5–15.5)
WBC: 11.2 10*3/uL — ABNORMAL HIGH (ref 4.0–10.5)
nRBC: 0 % (ref 0.0–0.2)

## 2021-05-11 LAB — BASIC METABOLIC PANEL
Anion gap: 10 (ref 5–15)
BUN: 14 mg/dL (ref 8–23)
CO2: 27 mmol/L (ref 22–32)
Calcium: 9 mg/dL (ref 8.9–10.3)
Chloride: 100 mmol/L (ref 98–111)
Creatinine, Ser: 0.79 mg/dL (ref 0.44–1.00)
GFR, Estimated: >60 mL/min (ref >60)
Glucose, Bld: 145 mg/dL — ABNORMAL HIGH (ref 70–99)
Potassium: 3.9 mmol/L (ref 3.5–5.1)
Sodium: 137 mmol/L (ref 135–145)

## 2021-05-11 LAB — TROPONIN I (HIGH SENSITIVITY): Troponin I (High Sensitivity): 8 ng/L (ref <18)

## 2021-05-11 LAB — LACTIC ACID, PLASMA
Lactic Acid, Venous: 2.5 mmol/L (ref 0.5–1.9)
Lactic Acid, Venous: 1.9 mmol/L (ref 0.5–1.9)

## 2021-05-11 LAB — BRAIN NATRIURETIC PEPTIDE: B Natriuretic Peptide: 121.2 pg/mL — ABNORMAL HIGH (ref 0.0–100.0)

## 2021-05-11 MED ORDER — LACTATED RINGERS IV BOLUS (SEPSIS)
1000.0000 mL | Freq: Once | INTRAVENOUS | Status: AC
  Administered 2021-05-11: 1000 mL via INTRAVENOUS

## 2021-05-11 MED ORDER — ALBUTEROL SULFATE (2.5 MG/3ML) 0.083% IN NEBU
2.5000 mg | INHALATION_SOLUTION | Freq: Four times a day (QID) | RESPIRATORY_TRACT | Status: DC
  Administered 2021-05-11 – 2021-05-12 (×2): 2.5 mg via RESPIRATORY_TRACT
  Filled 2021-05-11 (×2): qty 3

## 2021-05-11 MED ORDER — SODIUM CHLORIDE 0.9 % IV SOLN
2.0000 g | INTRAVENOUS | Status: AC
  Administered 2021-05-11 – 2021-05-13 (×3): 2 g via INTRAVENOUS
  Filled 2021-05-11 (×3): qty 20

## 2021-05-11 MED ORDER — LACTATED RINGERS IV SOLN: INTRAVENOUS | Status: DC

## 2021-05-11 MED ORDER — LACTATED RINGERS IV SOLN: INTRAVENOUS | Status: AC

## 2021-05-11 MED ORDER — ENOXAPARIN SODIUM 40 MG/0.4ML IJ SOSY
40.0000 mg | PREFILLED_SYRINGE | INTRAMUSCULAR | Status: DC
  Administered 2021-05-11 – 2021-05-17 (×7): 40 mg via SUBCUTANEOUS
  Filled 2021-05-11 (×7): qty 0.4

## 2021-05-11 MED ORDER — SODIUM CHLORIDE 0.9 % IV SOLN
500.0000 mg | INTRAVENOUS | Status: AC
  Administered 2021-05-11 – 2021-05-13 (×3): 500 mg via INTRAVENOUS
  Filled 2021-05-11 (×3): qty 5

## 2021-05-11 MED ORDER — IPRATROPIUM-ALBUTEROL 0.5-2.5 (3) MG/3ML IN SOLN
3.0000 mL | Freq: Once | RESPIRATORY_TRACT | Status: AC
  Administered 2021-05-11: 3 mL via RESPIRATORY_TRACT
  Filled 2021-05-11: qty 3

## 2021-05-11 MED ORDER — LACTATED RINGERS IV BOLUS (SEPSIS)
500.0000 mL | Freq: Once | INTRAVENOUS | Status: AC
  Administered 2021-05-11: 500 mL via INTRAVENOUS

## 2021-05-11 MED ORDER — METHYLPREDNISOLONE SODIUM SUCC 125 MG IJ SOLR
125.0000 mg | Freq: Once | INTRAMUSCULAR | Status: AC
  Administered 2021-05-11: 125 mg via INTRAVENOUS
  Filled 2021-05-11: qty 2

## 2021-05-11 MED ORDER — ALBUTEROL SULFATE (2.5 MG/3ML) 0.083% IN NEBU
2.5000 mg | INHALATION_SOLUTION | RESPIRATORY_TRACT | Status: DC | PRN
  Administered 2021-05-16: 22:00:00 2.5 mg via RESPIRATORY_TRACT
  Filled 2021-05-11: qty 3

## 2021-05-11 MED ORDER — IPRATROPIUM BROMIDE 0.02 % IN SOLN
0.5000 mg | Freq: Four times a day (QID) | RESPIRATORY_TRACT | Status: DC
  Administered 2021-05-11 – 2021-05-12 (×2): 0.5 mg via RESPIRATORY_TRACT
  Filled 2021-05-11 (×2): qty 2.5

## 2021-05-11 NOTE — H&P (Signed)
History and Physical    [name] JSH:[id number] DOB: 1935-10-22 DOA: 05/11/2021  PCP: [name], MD  Patient coming from: Home.  Chief Complaint: Shortness of breath and cough.  HPI: [name] is a 86 y.o. female with history of COPD on home oxygen 2 L, rheumatoid arthritis, hyperlipidemia presents to the ER because of worsening shortness of breath with productive cough for the last 2 weeks.  Patient also states she had a fever 102 F at home.  Patient has been coughing up discolored sputum.  Some chest tightness.  ED Course: In the ER patient is found to be diffusely wheezing chest x-ray does not show any infiltrates but patient is having discolored sputum and coughing profusely.  Labs show elevated lactic acid and WBC count 12 BNP of 121.  Patient was started on empiric antibiotics admitted for COPD exacerbation with pneumonia.  COVID test negative.  Review of Systems: As per HPI, rest all negative.   Past Medical History:  Diagnosis Date   ACID REFLUX DISEASE 05/15/2007   Qualifier: Diagnosis of  By: [name] MD, Legrand Como B    Acute and chronic respiratory failure with hypoxia ([place])    Acute on chronic respiratory failure with hypoxia ([place]) 06/08/2018   Acute respiratory failure with hypercapnia (HCC)    Acute respiratory failure with hypoxia ([place]) 07/29/2017   Anemia 05/20/2016   Arthritis    Bladder neoplasm    Body mass index 34.0-34.9, adult    Bronchitis 07/06/2011   Chest pain 07/06/2011   Chronic cough    Chronic respiratory failure ([place])    Community acquired pneumonia    Constipation 07/03/2011   COPD (chronic obstructive pulmonary disease) ([place])    PULMOLOGIST-  DR [name]--  GOLD III W/  CHRONIC RESPIRATORY FAILURE--  O2 DEPENDENT   COPD with acute exacerbation (HCC) 06/29/2011   Coronary arteriosclerosis    Cough productive of purulent sputum    Depression    Dizziness    Dyslipidemia    Falls frequently    Fibromyalgia    GERD (gastroesophageal reflux  disease)    Hearing loss in right ear    Hematuria    History of nephritis    as child dx w/ Bright's disease (glomerulonephritis)   HLD (hyperlipidemia) 05/15/2007   Qualifier: Diagnosis of  By: [name] MD, Legrand Como B    Hyperglycemia 05/20/2016   Hyperlipidemia    Hypomagnesemia    Hypoxemia    Iron deficiency anemia, unspecified    Leukopenia    Low vitamin D level    Mild obstructive sleep apnea    no cpap recommendation   Neuropathy 07/29/2017   Neuropathy, peripheral, idiopathic    Osteopenia    Other allergic rhinitis    Pelvic pain in female    Polyarthralgia    PONV (postoperative nausea and vomiting)    Pre-diabetes    Rheumatoid arthritis ([place]) 07/29/2017   Shoulder impingement syndrome 03/25/2015   Sleep disorder, unspecified    SOB (shortness of breath)    Spinal stenosis    Stage 3 severe COPD by GOLD classification ([place]) 06/19/2007   Followed in Pulmonary clinic/ [facility]/ [place]  - hfa 90% 05/15/2012  - PFTs 05/15/2012 FEV1  0.61 ( 36%) ratio 57 and 21% better p B2 and DLCO 44%  106 corrected  - Referred to [facility] 05/15/12 - Rec pulmonary f/u prn 02/11/13     Supplemental oxygen dependent    2L  via Nasal Canula --  activity  and nighttime   Tachycardia    Urinary incontinence in female     Past Surgical History:  Procedure Laterality Date   [place]  05-08-2007  dr Marlou Porch   minor coronary plaquing w/ no significant CAD/  20% mLAD,  10% mRCA,  perserved LV, ef 60-65%   CATARACT EXTRACTION W/ INTRAOCULAR LENS  IMPLANT, BILATERAL  2001 approx   CHOLECYSTECTOMY  1988   CYSTOSCOPY W/ RETROGRADES Bilateral 09/24/2014   Procedure: CYSTOSCOPY WITH RETROGRADE PYELOGRAM;  Surgeon: Festus Aloe, MD;  Location: [facility];  Service: Urology;  Laterality: Bilateral;   CYSTOSCOPY WITH BIOPSY N/A 09/24/2014   Procedure: CYSTOSCOPY WITH BIOPSY;  Surgeon: Festus Aloe, MD;  Location: [facility];  Service:  Urology;  Laterality: N/A;   ESOPHAGOGASTRODUODENOSCOPY  last one 09-09-2008   FULGURATION OF BLADDER TUMOR N/A 09/24/2014   Procedure: FULGURATION OF BLADDER TUMOR;  Surgeon: Festus Aloe, MD;  Location: [facility];  Service: Urology;  Laterality: N/A;   [place] LUMBAR FUSION  10-07-2007   bilateral laminectomy L4 and bilateral L4-5 diskectomy w/ fusion   TONSILLECTOMY  as child   TOTAL ABDOMINAL HYSTERECTOMY W/ BILATERAL SALPINGOOPHORECTOMY  1973   TRANSTHORACIC ECHOCARDIOGRAM  07-07-2011   normal echo,  ef 65-70%     reports that she quit smoking about 9 years ago. Her smoking use included cigarettes. She has a 180.00 pack-year smoking history. She has never used smokeless tobacco. She reports that she does not drink alcohol and does not use drugs.  Allergies  Allergen Reactions   Kapidex [Dexlansoprazole] Other (See Comments)    Stomach pain, diarrhea   Zegerid [Omeprazole] Other (See Comments)    Burning in chest    Family History  Problem Relation Age of Onset   Asthma Maternal Grandmother    Breast cancer Maternal Grandmother     Prior to Admission medications   Medication Sig Start Date End Date Taking? Authorizing Provider  acetaminophen (TYLENOL) 325 MG tablet Take 2 tablets (650 mg total) by mouth every 6 (six) hours as needed for mild pain (or Fever >/= 101). Patient not taking: Reported on 09/16/2019 08/02/17   Roxan Hockey, MD  acetaminophen (TYLENOL) 650 MG CR tablet Take 1,300 mg by mouth every 8 (eight) hours as needed for pain.    [provider]  albuterol (PROVENTIL) (2.5 MG/3ML) 0.083% nebulizer solution Take 3 mLs (2.5 mg total) by nebulization every 4 (four) hours as needed for wheezing or shortness of breath. For shortness of breath. 07/28/17   Roxan Hockey, MD  aspirin EC 81 MG tablet Take 81 mg by mouth daily.    [provider]  atorvastatin (LIPITOR) 40 MG tablet Take 40 mg by mouth  every morning.     [provider]  azelastine (ASTELIN) 0.1 % nasal spray Place 1 spray into the nose 2 (two) times daily as needed for allergies.     [provider]  budesonide-formoterol (SYMBICORT) 160-4.5 MCG/ACT inhaler Inhale 2 puffs into the lungs 2 (two) times daily. Patient not taking: Reported on 08/13/2019 08/02/17   Roxan Hockey, MD  calcium-vitamin D (OSCAL WITH D) 500-200 MG-UNIT tablet Take 2 tablets by mouth daily with breakfast. 06/11/18   Georgette Shell, MD  carboxymethylcellulose (REFRESH PLUS) 0.5 % SOLN 2 drops 2 (two) times daily as needed (dry eye).    [provider]  cetirizine (ZYRTEC) 10 MG tablet Take 1 tablet (  10 mg total) by mouth every morning. 08/02/17   Roxan Hockey, MD  Cholecalciferol (VITAMIN D) 2000 UNITS tablet Take 4,000 Units by mouth daily.     [provider]  cyanocobalamin 1000 MCG tablet Take 1 tablet (1,000 mcg total) by mouth daily. 08/02/17   Roxan Hockey, MD  cyclobenzaprine (FLEXERIL) 5 MG tablet Take 1 tablet (5 mg total) by mouth 2 (two) times daily as needed for up to 10 doses for muscle spasms. 10/18/20   Curatolo, Adam, DO  esomeprazole (NEXIUM) 40 MG capsule Take 1 capsule (40 mg total) by mouth every morning. 08/02/17   Roxan Hockey, MD  folic acid (FOLVITE) 1 MG tablet Take 1 tablet (1 mg total) by mouth daily. 08/02/17   Roxan Hockey, MD  gabapentin (NEURONTIN) 100 MG capsule Take 2-3 capsules (200-300 mg total) by mouth 3 (three) times daily. 200 mg every AM and at noon and 300 mg QHS 08/02/17   Roxan Hockey, MD  hydroxychloroquine (PLAQUENIL) 200 MG tablet Take 200 mg by mouth daily.    [provider]  ipratropium-albuterol (DUONEB) 0.5-2.5 (3) MG/3ML SOLN Take 3 mLs by nebulization 2 (two) times daily.    [provider]  meloxicam (MOBIC) 15 MG tablet Take 15 mg by mouth daily. 04/24/18   [provider]  methylPREDNISolone (MEDROL DOSEPAK) 4 MG TBPK  tablet Follow package insert 10/18/20   Curatolo, Adam, DO  mupirocin ointment (BACTROBAN) 2 % Place 1 application into the nose 2 (two) times daily. 06/10/18   Georgette Shell, MD  polyethylene glycol [facility] / Floria Raveling) packet Take 17 g by mouth daily. Patient taking differently: Take 17 g by mouth daily as needed for moderate constipation.  08/03/17   Roxan Hockey, MD  polyvinyl alcohol (LIQUIFILM TEARS) 1.4 % ophthalmic solution Place 2 drops into both eyes as needed for dry eyes. 08/02/17   Roxan Hockey, MD  sertraline (ZOLOFT) 100 MG tablet Take 100 mg by mouth daily.    [provider]  tiotropium (SPIRIVA) 18 MCG inhalation capsule Place 1 capsule (18 mcg total) into inhaler and inhale every morning. Patient not taking: Reported on 09/16/2019 08/02/17   Roxan Hockey, MD  TRELEGY ELLIPTA 100-62.5-25 MCG/INH AEPB Take 1 puff by mouth daily. 06/30/19   [provider]    Physical Exam: Constitutional: Moderately built and nourished. Vitals:   05/11/21 1945 05/11/21 2020 05/11/21 2030 05/11/21 2100  BP: 98/81 (!) 122/52 (!) 131/113 120/60  Pulse: 87 76 81 77  Resp: (!) 26 (!) 24 (!) 26 18  Temp:      TempSrc:      SpO2: 100% 95% 97% 96%  Weight:      Height:       Eyes: Anicteric no pallor. ENMT: No discharge from the ears eyes nose and mouth. Neck: No mass felt.  No neck rigidity. Respiratory: Bilateral expiratory wheeze heard.  No crepitations. Cardiovascular: S1-S2 heard. Abdomen: Soft nontender bowel sound present. Musculoskeletal: No edema. Skin: No rash. Neurologic: Alert awake oriented time place and person.  Moves all extremities. Psychiatric: Appears normal.  Normal affect.   Labs on Admission: I have personally reviewed following labs and imaging studies  CBC: Recent Labs  Lab 05/11/21 1902  WBC 12.0*  NEUTROABS 10.9*  HGB 13.3  HCT 42.8  MCV 92.2  PLT 563*   Basic Metabolic Panel: Recent Labs  Lab 05/11/21 1902  NA 137   K 3.9  CL 100  CO2 27  GLUCOSE 145*  BUN  14  CREATININE 0.79  CALCIUM 9.0   GFR: Estimated Creatinine Clearance: 48.9 mL/min (by C-G formula based on SCr of 0.79 mg/dL). Liver Function Tests: No results for input(s): AST, ALT, ALKPHOS, BILITOT, PROT, ALBUMIN in the last 168 hours. No results for input(s): LIPASE, AMYLASE in the last 168 hours. No results for input(s): AMMONIA in the last 168 hours. Coagulation Profile: No results for input(s): INR, PROTIME in the last 168 hours. Cardiac Enzymes: No results for input(s): CKTOTAL, CKMB, CKMBINDEX, TROPONINI in the last 168 hours. BNP (last 3 results) No results for input(s): PROBNP in the last 8760 hours. HbA1C: No results for input(s): HGBA1C in the last 72 hours. CBG: No results for input(s): GLUCAP in the last 168 hours. Lipid Profile: No results for input(s): CHOL, HDL, LDLCALC, TRIG, CHOLHDL, LDLDIRECT in the last 72 hours. Thyroid Function Tests: No results for input(s): TSH, T4TOTAL, FREET4, T3FREE, THYROIDAB in the last 72 hours. Anemia Panel: No results for input(s): VITAMINB12, FOLATE, FERRITIN, TIBC, IRON, RETICCTPCT in the last 72 hours. Urine analysis:    Component Value Date/Time   COLORURINE YELLOW 07/16/2019 1701   APPEARANCEUR HAZY (A) 07/16/2019 1701   LABSPEC 1.013 07/16/2019 1701   PHURINE 5.0 07/16/2019 1701   GLUCOSEU NEGATIVE 07/16/2019 1701   HGBUR NEGATIVE 07/16/2019 1701   BILIRUBINUR NEGATIVE 07/16/2019 1701   BILIRUBINUR neg 08/17/2013 1446   KETONESUR NEGATIVE 07/16/2019 1701   PROTEINUR NEGATIVE 07/16/2019 1701   UROBILINOGEN 0.2 08/17/2013 1446   UROBILINOGEN 0.2 07/06/2011 1519   NITRITE NEGATIVE 07/16/2019 1701   LEUKOCYTESUR LARGE (A) 07/16/2019 1701   Sepsis Labs: @LABRCNTIP (procalcitonin:4,lacticidven:4) ) Recent Results (from the past 240 hour(s))  Resp Panel by RT-PCR (Flu A&B, Covid) Nasopharyngeal Swab     Status: None   Collection Time: 05/11/21  6:28 PM   Specimen:  Nasopharyngeal Swab; Nasopharyngeal(NP) swabs in vial transport medium  Result Value Ref Range Status   SARS Coronavirus 2 by RT PCR NEGATIVE NEGATIVE Final    Comment: (NOTE) SARS-CoV-2 target nucleic acids are NOT DETECTED.  The SARS-CoV-2 RNA is generally detectable in upper respiratory specimens during the acute phase of infection. The lowest concentration of SARS-CoV-2 viral copies this assay can detect is 138 copies/mL. A negative result does not preclude SARS-Cov-2 infection and should not be used as the sole basis for treatment or other patient management decisions. A negative result may occur with  improper specimen collection/handling, submission of specimen other than nasopharyngeal swab, presence of viral mutation(s) within the areas targeted by this assay, and inadequate number of viral copies(<138 copies/mL). A negative result must be combined with clinical observations, patient history, and epidemiological information. The expected result is Negative.  Fact Sheet for Patients:  EntrepreneurPulse.com.au  Fact Sheet for Healthcare Providers:  IncredibleEmployment.be  This test is no t yet approved or cleared by the [place] FDA and  has been authorized for detection and/or diagnosis of SARS-CoV-2 by FDA under an Emergency Use Authorization (EUA). This EUA will remain  in effect (meaning this test can be used) for the duration of the COVID-19 declaration under Section 564(b)(1) of the Act, 21 U.S.C.section 360bbb-3(b)(1), unless the authorization is terminated  or revoked sooner.       Influenza A by PCR NEGATIVE NEGATIVE Final   Influenza B by PCR NEGATIVE NEGATIVE Final    Comment: (NOTE) The Xpert Xpress SARS-CoV-2/FLU/RSV plus assay is intended as an aid in the diagnosis of influenza from Nasopharyngeal swab specimens and should not be used as a  sole basis for treatment. Nasal washings and aspirates are unacceptable for  Xpert Xpress SARS-CoV-2/FLU/RSV testing.  Fact Sheet for Patients: EntrepreneurPulse.com.au  Fact Sheet for Healthcare Providers: IncredibleEmployment.be  This test is not yet approved or cleared by the [place] FDA and has been authorized for detection and/or diagnosis of SARS-CoV-2 by FDA under an Emergency Use Authorization (EUA). This EUA will remain in effect (meaning this test can be used) for the duration of the COVID-19 declaration under Section 564(b)(1) of the Act, 21 U.S.C. section 360bbb-3(b)(1), unless the authorization is terminated or revoked.  Performed at [facility], [place] [address] [place], [place] [address]      Radiological Exams on Admission: DG Chest 2 View  Result Date: 05/11/2021 CLINICAL DATA:  Increasing shortness of breath for 2 weeks, productive cough, febrile EXAM: CHEST - 2 VIEW COMPARISON:  10/18/2020 FINDINGS: Frontal and lateral views of the chest demonstrate an unremarkable cardiac silhouette. Stable hyperinflation and background emphysema. No airspace disease, effusion, or pneumothorax. No acute bony abnormality. IMPRESSION: 1. Stable exam, no acute process. Electronically Signed   By: Randa Ngo M.D.   On: 05/11/2021 18:50    EKG: Independently reviewed.  Sinus rhythm.  Assessment/Plan Principal Problem:   CAP (community acquired pneumonia) Active Problems:   HLD (hyperlipidemia)   Rheumatoid arthritis ([place])    COPD exacerbation with possible pneumonia for which patient is on empiric antibiotics check sputum cultures and for COPD exacerbation we will keep patient on IV steroids nebulizer Pulmicort.  Closely monitor respiratory status.  Lactic acid has been elevated which we will trend after fluids. History of rheumatoid arthritis takes steroids every day.  Presently on IV Solu-Medrol.  We will hold Plaquenil for now. History of hyperlipidemia takes statins Home dose needs to be  verified.  Home medications needs to be verified   DVT prophylaxis: Lovenox. Code Status: Full code. Family Communication: Discussed with patient. Disposition Plan: Home. Consults called: None. Admission status: Observation.   Rise Patience MD [facility] Pager [phone].  If 7PM-7AM, please contact night-coverage [email or web address] Password [facility]  05/11/2021, 9:26 PM

## 2021-05-11 NOTE — ED Triage Notes (Signed)
Patient reports increased shortness of breath for the past two weeks. Productive cough, thick dark green sputum. Had a temp of 102 today. Has had diarrhea.

## 2021-05-11 NOTE — ED Notes (Signed)
Patients sister called for a call back with an update: Zella Ball [phone] cell                                                                                                 [phone] hm

## 2021-05-11 NOTE — ED Provider Triage Note (Signed)
Emergency Medicine Provider Triage Evaluation Note  [name] , a 86 y.o. female  was evaluated in triage.  Pt complains of worsening shortness of breath over the past week.  She has associated chest tightness.  Patient states she developed a cough and has gradually worsened.  She had a fever today up to 102.  Patient does not wear oxygen at home but does have a history of COPD.  She has not had any leg swelling.  Review of Systems  Positive: Shortness of breath, wheezing, fever, chest tightness Negative:   Physical Exam  BP (!) 153/77    Pulse 93    Temp 98.4 F (36.9 C) (Oral)    Resp (!) 22    Ht 5' (1.524 m)    Wt 77.1 kg    SpO2 91%    BMI 33.20 kg/m  Gen:   Awake, no distress  Resp:  Patient is dyspneic at rest.  Lungs sound wheezing bilaterally.  No focal areas of crackles or rhonchi. MSK:   Moves extremities without difficulty  Other:    Medical Decision Making  Medically screening exam initiated at 6:27 PM.  Appropriate orders placed.  [name] was informed that the remainder of the evaluation will be completed by another provider, this initial triage assessment does not replace that evaluation, and the importance of remaining in the ED until their evaluation is complete.  Shortness of breath labs ordered.  Chest x-ray will be obtained.  Likely COPD exasperation with possible pneumonia.  Will obtain lactate and blood cultures.  DuoNeb and Solu-Medrol ordered in triage.  Oxygen was applied to 2 L.  Patient currently stable.   Adolphus Birchwood, [place] 05/11/21 1829

## 2021-05-11 NOTE — Sepsis Progress Note (Signed)
Monitoring for the code sepsis protocol. °

## 2021-05-11 NOTE — ED Provider Notes (Signed)
[facility] DEPT Provider Note   CSN: [id number] Arrival date & time: 05/11/21  1812     History  Chief Complaint  Patient presents with   Shortness of Breath    [name] is a 86 y.o. female.  86 year old female who presents with increasing shortness of breath times several days.  History of COPD and does use oxygen at home.  Has been using her home nebulizers as well.  Continues to have increase dyspnea and tachypnea.  Fever today up to 102 degrees.  She is also had watery diarrhea but denies any emesis.  No abdominal or chest discomfort.  Does endorse increased weakness      Home Medications Prior to Admission medications   Medication Sig Start Date End Date Taking? Authorizing Provider  acetaminophen (TYLENOL) 325 MG tablet Take 2 tablets (650 mg total) by mouth every 6 (six) hours as needed for mild pain (or Fever >/= 101). Patient not taking: Reported on 09/16/2019 08/02/17   Roxan Hockey, MD  acetaminophen (TYLENOL) 650 MG CR tablet Take 1,300 mg by mouth every 8 (eight) hours as needed for pain.    [provider]  albuterol (PROVENTIL) (2.5 MG/3ML) 0.083% nebulizer solution Take 3 mLs (2.5 mg total) by nebulization every 4 (four) hours as needed for wheezing or shortness of breath. For shortness of breath. 07/28/17   Roxan Hockey, MD  aspirin EC 81 MG tablet Take 81 mg by mouth daily.    [provider]  atorvastatin (LIPITOR) 40 MG tablet Take 40 mg by mouth every morning.     [provider]  azelastine (ASTELIN) 0.1 % nasal spray Place 1 spray into the nose 2 (two) times daily as needed for allergies.     [provider]  budesonide-formoterol (SYMBICORT) 160-4.5 MCG/ACT inhaler Inhale 2 puffs into the lungs 2 (two) times daily. Patient not taking: Reported on 08/13/2019 08/02/17   Roxan Hockey, MD  calcium-vitamin D (OSCAL WITH D) 500-200 MG-UNIT tablet Take 2 tablets by mouth daily with  breakfast. 06/11/18   Georgette Shell, MD  carboxymethylcellulose (REFRESH PLUS) 0.5 % SOLN 2 drops 2 (two) times daily as needed (dry eye).    [provider]  cetirizine (ZYRTEC) 10 MG tablet Take 1 tablet (10 mg total) by mouth every morning. 08/02/17   Roxan Hockey, MD  Cholecalciferol (VITAMIN D) 2000 UNITS tablet Take 4,000 Units by mouth daily.     [provider]  cyanocobalamin 1000 MCG tablet Take 1 tablet (1,000 mcg total) by mouth daily. 08/02/17   Roxan Hockey, MD  cyclobenzaprine (FLEXERIL) 5 MG tablet Take 1 tablet (5 mg total) by mouth 2 (two) times daily as needed for up to 10 doses for muscle spasms. 10/18/20   Curatolo, Adam, DO  esomeprazole (NEXIUM) 40 MG capsule Take 1 capsule (40 mg total) by mouth every morning. 08/02/17   Roxan Hockey, MD  folic acid (FOLVITE) 1 MG tablet Take 1 tablet (1 mg total) by mouth daily. 08/02/17   Roxan Hockey, MD  gabapentin (NEURONTIN) 100 MG capsule Take 2-3 capsules (200-300 mg total) by mouth 3 (three) times daily. 200 mg every AM and at noon and 300 mg QHS 08/02/17   Roxan Hockey, MD  hydroxychloroquine (PLAQUENIL) 200 MG tablet Take 200 mg by mouth daily.    [provider]  ipratropium-albuterol (DUONEB) 0.5-2.5 (3) MG/3ML SOLN Take 3 mLs by nebulization 2 (two) times daily.    [provider]  meloxicam [facility])  15 MG tablet Take 15 mg by mouth daily. 04/24/18   [provider]  methylPREDNISolone (MEDROL DOSEPAK) 4 MG TBPK tablet Follow package insert 10/18/20   Curatolo, Adam, DO  mupirocin ointment (BACTROBAN) 2 % Place 1 application into the nose 2 (two) times daily. 06/10/18   Georgette Shell, MD  polyethylene glycol [facility] Floria Raveling) packet Take 17 g by mouth daily. Patient taking differently: Take 17 g by mouth daily as needed for moderate constipation.  08/03/17   Roxan Hockey, MD  polyvinyl alcohol (LIQUIFILM TEARS) 1.4 % ophthalmic solution Place 2 drops into  both eyes as needed for dry eyes. 08/02/17   Roxan Hockey, MD  sertraline (ZOLOFT) 100 MG tablet Take 100 mg by mouth daily.    [provider]  tiotropium (SPIRIVA) 18 MCG inhalation capsule Place 1 capsule (18 mcg total) into inhaler and inhale every morning. Patient not taking: Reported on 09/16/2019 08/02/17   Roxan Hockey, MD  TRELEGY ELLIPTA 100-62.5-25 MCG/INH AEPB Take 1 puff by mouth daily. 06/30/19   [provider]      Allergies    Kapidex [dexlansoprazole] and Zegerid [omeprazole]    Review of Systems   Review of Systems  All other systems reviewed and are negative.  Physical Exam Updated Vital Signs BP (!) 153/77    Pulse 93    Temp 98.4 F (36.9 C) (Oral)    Resp (!) 22    Ht 1.524 m (5')    Wt 77.1 kg    SpO2 91%    BMI 33.20 kg/m  Physical Exam Vitals and nursing note reviewed.  Constitutional:      General: She is not in acute distress.    Appearance: Normal appearance. She is well-developed. She is not toxic-appearing.  HENT:     Head: Normocephalic and atraumatic.  Eyes:     General: Lids are normal.     Conjunctiva/sclera: Conjunctivae normal.     Pupils: Pupils are equal, round, and reactive to light.  Neck:     Thyroid: No thyroid mass.     Trachea: No tracheal deviation.  Cardiovascular:     Rate and Rhythm: Normal rate and regular rhythm.     Heart sounds: Normal heart sounds. No murmur heard.   No gallop.  Pulmonary:     Effort: Pulmonary effort is normal. No respiratory distress.     Breath sounds: No stridor. Decreased breath sounds and wheezing present. No rhonchi or rales.  Abdominal:     General: There is no distension.     Palpations: Abdomen is soft.     Tenderness: There is no abdominal tenderness. There is no rebound.  Musculoskeletal:        General: No tenderness. Normal range of motion.     Cervical back: Normal range of motion and neck supple.  Skin:    General: Skin is warm and dry.     Findings: No abrasion  or rash.  Neurological:     Mental Status: She is alert and oriented to person, place, and time. Mental status is at baseline.     GCS: GCS eye subscore is 4. GCS verbal subscore is 5. GCS motor subscore is 6.     Cranial Nerves: No cranial nerve deficit.     Sensory: No sensory deficit.     Motor: Motor function is intact.  Psychiatric:        Attention and Perception: Attention normal.        Speech: Speech  normal.        Behavior: Behavior normal.    ED Results / Procedures / Treatments   Labs (all labs ordered are listed, but only abnormal results are displayed) Labs Reviewed  RESP PANEL BY RT-PCR (FLU A&B, COVID) [id number]  CULTURE, BLOOD (ROUTINE X 2)  CULTURE, BLOOD (ROUTINE X 2)  BASIC METABOLIC PANEL  BRAIN NATRIURETIC PEPTIDE  LACTIC ACID, PLASMA  LACTIC ACID, PLASMA  CBC WITH DIFFERENTIAL/PLATELET    EKG EKG Interpretation  Date/Time:  Thursday May 11 2021 18:27:43 EST Ventricular Rate:  95 PR Interval:  167 QRS Duration: 102 QT Interval:  312 QTC Calculation: 393 R Axis:   7 Text Interpretation: Sinus tachycardia Ventricular trigeminy Low voltage, precordial leads Probable anteroseptal infarct, old Confirmed by Lacretia Leigh ([id number]) on 05/11/2021 7:04:31 PM  Radiology DG Chest 2 View  Result Date: 05/11/2021 CLINICAL DATA:  Increasing shortness of breath for 2 weeks, productive cough, febrile EXAM: CHEST - 2 VIEW COMPARISON:  10/18/2020 FINDINGS: Frontal and lateral views of the chest demonstrate an unremarkable cardiac silhouette. Stable hyperinflation and background emphysema. No airspace disease, effusion, or pneumothorax. No acute bony abnormality. IMPRESSION: 1. Stable exam, no acute process. Electronically Signed   By: Randa Ngo M.D.   On: 05/11/2021 18:50    Procedures Procedures    Medications Ordered in ED Medications  ipratropium-albuterol (DUONEB) 0.5-2.5 (3) MG/3ML nebulizer solution 3 mL (has no administration in time range)   methylPREDNISolone sodium succinate (SOLU-MEDROL) 125 mg/2 mL injection 125 mg (has no administration in time range)    ED Course/ Medical Decision Making/ A&P                           Medical Decision Making Amount and/or Complexity of Data Reviewed Labs: ordered.  Risk Prescription drug management. Decision regarding hospitalization.  Critical Care Total time providing critical care: 30-74 minutes  Patient with clinical evidence of pneumonia here.  Has elevated lactate.  Chest x-ray did not show any infiltrates but patient still started on IV antibiotics and blood cultures obtained.  Blood pressure stable here.  No evidence of overwhelming sepsis.  Mild leukocytosis noted on CBC.  No evidence of CHF.  Will give patient dose of Solu-Medrol as patient could be having a concomitant COPD exacerbation.  COVID and flu test negative here.  Discussed with patient's relative that patient will require admission.  Discussed with hospitalist will admit the patient.  All records reviewed extensively.        Final Clinical Impression(s) / ED Diagnoses Final diagnoses:  None    Rx / DC Orders ED Discharge Orders     None         Lacretia Leigh, MD 05/11/21 2121

## 2021-05-12 DIAGNOSIS — E785 Hyperlipidemia, unspecified: Secondary | ICD-10-CM | POA: Diagnosis present

## 2021-05-12 DIAGNOSIS — I2510 Atherosclerotic heart disease of native coronary artery without angina pectoris: Secondary | ICD-10-CM | POA: Diagnosis present

## 2021-05-12 DIAGNOSIS — M797 Fibromyalgia: Secondary | ICD-10-CM | POA: Diagnosis present

## 2021-05-12 DIAGNOSIS — Z803 Family history of malignant neoplasm of breast: Secondary | ICD-10-CM | POA: Diagnosis not present

## 2021-05-12 DIAGNOSIS — J9611 Chronic respiratory failure with hypoxia: Secondary | ICD-10-CM | POA: Diagnosis present

## 2021-05-12 DIAGNOSIS — J441 Chronic obstructive pulmonary disease with (acute) exacerbation: Secondary | ICD-10-CM | POA: Diagnosis present

## 2021-05-12 DIAGNOSIS — Z888 Allergy status to other drugs, medicaments and biological substances status: Secondary | ICD-10-CM | POA: Diagnosis not present

## 2021-05-12 DIAGNOSIS — Z825 Family history of asthma and other chronic lower respiratory diseases: Secondary | ICD-10-CM | POA: Diagnosis not present

## 2021-05-12 DIAGNOSIS — Z90710 Acquired absence of both cervix and uterus: Secondary | ICD-10-CM | POA: Diagnosis not present

## 2021-05-12 DIAGNOSIS — Z20822 Contact with and (suspected) exposure to covid-19: Secondary | ICD-10-CM | POA: Diagnosis present

## 2021-05-12 DIAGNOSIS — F32A Depression, unspecified: Secondary | ICD-10-CM | POA: Diagnosis present

## 2021-05-12 DIAGNOSIS — Z79899 Other long term (current) drug therapy: Secondary | ICD-10-CM | POA: Diagnosis not present

## 2021-05-12 DIAGNOSIS — H9191 Unspecified hearing loss, right ear: Secondary | ICD-10-CM | POA: Diagnosis present

## 2021-05-12 DIAGNOSIS — J189 Pneumonia, unspecified organism: Secondary | ICD-10-CM | POA: Diagnosis present

## 2021-05-12 DIAGNOSIS — Z7952 Long term (current) use of systemic steroids: Secondary | ICD-10-CM | POA: Diagnosis not present

## 2021-05-12 DIAGNOSIS — G4733 Obstructive sleep apnea (adult) (pediatric): Secondary | ICD-10-CM | POA: Diagnosis present

## 2021-05-12 DIAGNOSIS — G609 Hereditary and idiopathic neuropathy, unspecified: Secondary | ICD-10-CM | POA: Diagnosis present

## 2021-05-12 DIAGNOSIS — J440 Chronic obstructive pulmonary disease with acute lower respiratory infection: Secondary | ICD-10-CM | POA: Diagnosis present

## 2021-05-12 DIAGNOSIS — Z9981 Dependence on supplemental oxygen: Secondary | ICD-10-CM | POA: Diagnosis not present

## 2021-05-12 DIAGNOSIS — M069 Rheumatoid arthritis, unspecified: Secondary | ICD-10-CM | POA: Diagnosis present

## 2021-05-12 DIAGNOSIS — M8580 Other specified disorders of bone density and structure, unspecified site: Secondary | ICD-10-CM | POA: Diagnosis present

## 2021-05-12 DIAGNOSIS — F064 Anxiety disorder due to known physiological condition: Secondary | ICD-10-CM | POA: Diagnosis present

## 2021-05-12 DIAGNOSIS — K219 Gastro-esophageal reflux disease without esophagitis: Secondary | ICD-10-CM | POA: Diagnosis present

## 2021-05-12 DIAGNOSIS — M1990 Unspecified osteoarthritis, unspecified site: Secondary | ICD-10-CM | POA: Diagnosis present

## 2021-05-12 LAB — CBC
HCT: 39.5 % (ref 36.0–46.0)
Hemoglobin: 12.4 g/dL (ref 12.0–15.0)
MCH: 29.2 pg (ref 26.0–34.0)
MCHC: 31.4 g/dL (ref 30.0–36.0)
MCV: 92.9 fL (ref 80.0–100.0)
Platelets: 382 10*3/uL (ref 150–400)
RBC: 4.25 MIL/uL (ref 3.87–5.11)
RDW: 13.4 % (ref 11.5–15.5)
WBC: 10.2 10*3/uL (ref 4.0–10.5)
nRBC: 0 % (ref 0.0–0.2)

## 2021-05-12 LAB — BASIC METABOLIC PANEL
Anion gap: 10 (ref 5–15)
BUN: 14 mg/dL (ref 8–23)
CO2: 27 mmol/L (ref 22–32)
Calcium: 8.3 mg/dL — ABNORMAL LOW (ref 8.9–10.3)
Chloride: 98 mmol/L (ref 98–111)
Creatinine, Ser: 0.91 mg/dL (ref 0.44–1.00)
GFR, Estimated: >60 mL/min (ref >60)
Glucose, Bld: 191 mg/dL — ABNORMAL HIGH (ref 70–99)
Potassium: 3.8 mmol/L (ref 3.5–5.1)
Sodium: 135 mmol/L (ref 135–145)

## 2021-05-12 LAB — TROPONIN I (HIGH SENSITIVITY): Troponin I (High Sensitivity): 7 ng/L (ref <18)

## 2021-05-12 LAB — LACTIC ACID, PLASMA
Lactic Acid, Venous: 2.8 mmol/L (ref 0.5–1.9)
Lactic Acid, Venous: 3.7 mmol/L (ref 0.5–1.9)
Lactic Acid, Venous: 4.8 mmol/L (ref 0.5–1.9)

## 2021-05-12 MED ORDER — IPRATROPIUM-ALBUTEROL 0.5-2.5 (3) MG/3ML IN SOLN
3.0000 mL | Freq: Four times a day (QID) | RESPIRATORY_TRACT | Status: DC
  Administered 2021-05-12 – 2021-05-14 (×9): 3 mL via RESPIRATORY_TRACT
  Filled 2021-05-12 (×9): qty 3

## 2021-05-12 MED ORDER — MELATONIN 5 MG PO TABS
5.0000 mg | ORAL_TABLET | Freq: Once | ORAL | Status: AC
  Administered 2021-05-12: 5 mg via ORAL
  Filled 2021-05-12: qty 1

## 2021-05-12 MED ORDER — LACTATED RINGERS IV BOLUS
1000.0000 mL | Freq: Once | INTRAVENOUS | Status: AC
  Administered 2021-05-12: 1000 mL via INTRAVENOUS

## 2021-05-12 MED ORDER — GUAIFENESIN-DM 100-10 MG/5ML PO SYRP
5.0000 mL | ORAL_SOLUTION | ORAL | Status: DC | PRN
  Administered 2021-05-12 – 2021-05-17 (×17): 5 mL via ORAL
  Filled 2021-05-12 (×17): qty 5

## 2021-05-12 MED ORDER — POLYVINYL ALCOHOL 1.4 % OP SOLN
1.0000 [drp] | Freq: Every day | OPHTHALMIC | Status: DC | PRN
  Administered 2021-05-12: 1 [drp] via OPHTHALMIC
  Filled 2021-05-12: qty 15

## 2021-05-12 MED ORDER — PANTOPRAZOLE SODIUM 40 MG PO TBEC
40.0000 mg | DELAYED_RELEASE_TABLET | Freq: Every day | ORAL | Status: DC
  Administered 2021-05-12 – 2021-05-18 (×7): 40 mg via ORAL
  Filled 2021-05-12 (×7): qty 1

## 2021-05-12 MED ORDER — BUDESONIDE 0.25 MG/2ML IN SUSP
0.2500 mg | Freq: Two times a day (BID) | RESPIRATORY_TRACT | Status: DC
  Administered 2021-05-12 – 2021-05-18 (×13): 0.25 mg via RESPIRATORY_TRACT
  Filled 2021-05-12 (×14): qty 2

## 2021-05-12 MED ORDER — OYSTER SHELL CALCIUM/D3 500-5 MG-MCG PO TABS
1.0000 | ORAL_TABLET | Freq: Every day | ORAL | Status: DC
  Administered 2021-05-12 – 2021-05-18 (×7): 1 via ORAL
  Filled 2021-05-12 (×7): qty 1

## 2021-05-12 MED ORDER — METHYLPREDNISOLONE SODIUM SUCC 40 MG IJ SOLR
40.0000 mg | Freq: Two times a day (BID) | INTRAMUSCULAR | Status: DC
  Administered 2021-05-12 – 2021-05-13 (×3): 40 mg via INTRAVENOUS
  Filled 2021-05-12 (×3): qty 1

## 2021-05-12 NOTE — Progress Notes (Signed)
Transition of Care [facility]) Screening Note  Patient Details  Name: ESTEE YOHE Date of Birth: 1935-06-25  Transition of Care [facility]) CM/SW Contact:    Sherie Don, LCSW Phone Number: 05/12/2021, 1:48 PM  Transition of Care Department [facility]) has reviewed patient and no TOC needs have been identified at this time. We will continue to monitor patient advancement through interdisciplinary progression rounds. If new patient transition needs arise, please place a TOC consult.

## 2021-05-12 NOTE — Progress Notes (Signed)
Pt is injury-free, afebrile, alert, and oriented X 4. Vital signs were within the baseline during this shift.Pt experiences SOB and dyspnea with exertion. Her oxygen saturation is stable on 2 L [place]. Critical lab L. Acid 4.8 and Lactate of 3.4 reported to the hospitalist. 1000 ml bolus of LR given. Pt denies chest pain, nausea, vomiting, dizziness, signs or symptoms of bleeding or acute changes during this shift. We will continue to monitor and work toward achieving the care plan goal

## 2021-05-12 NOTE — Progress Notes (Signed)
[name] Berke  XBJ:[id number] DOB: Feb 09, 1936 DOA: 05/11/2021 PCP: [name], MD    Brief Narrative:  [phone] with a history of COPD requiring 2 L nasal cannula oxygen support, RA, and HLD who presented to the ER with worsening shortness of breath and productive cough progressive over a 2-week timeframe.  A fever up to 102 had been noted at home.  Exam in the ER revealed diffuse wheezing.  There was no apparent infiltrate on CXR but the patient was producing significant discolored sputum.  WBC was elevated at 12.  Consultants:  None  Code Status: FULL CODE  Antimicrobials:  Azithromycin 1/19 > Rocephin 1/19 >  DVT prophylaxis: Lovenox  Interim Hx: Afebrile.  Mildly tachycardic.  Blood pressure stable.  Saturations 89% on baseline 2 L nasal cannula support.  Elevated lactic acid noted.  Feels that she is slightly better but still reports being short of breath.  Assessment & Plan:  COPD with chronic hypoxic respiratory failure with acute bronchospastic exacerbation Slowly improving -continue medical therapy -monitor trend  Community-acquired pneumonia Continue empiric antibiotic therapy  RA on chronic steroid therapy No evidence of acute flare at present  HLD Continue home medical therapy  Family Communication: No family present at time of exam Disposition: From home -we will need PT/OT evaluations to determine best disposition for discharge as she improves  Objective: Blood pressure 121/78, pulse 98, temperature 97.6 F (36.4 C), temperature source Oral, resp. rate 20, height 5' (1.524 m), weight 77.1 kg, SpO2 (!) 89 %.  Intake/Output Summary (Last 24 hours) at 05/12/2021 0946 Last data filed at 05/11/2021 2304 Gross per 24 hour  Intake 3850 ml  Output --  Net 3850 ml   Filed Weights   05/11/21 1825  Weight: 77.1 kg    Examination: General: No acute respiratory distress Lungs: Poor air movement diffusely with expiratory wheezing and no focal  crackles Cardiovascular: Regular rate and rhythm without murmur gallop or rub normal S1 and S2 Abdomen: Nontender, nondistended, soft, bowel sounds positive, no rebound, no ascites, no appreciable mass Extremities: No significant cyanosis, clubbing, or edema bilateral lower extremities  CBC: Recent Labs  Lab 05/11/21 1902 05/11/21 2240 05/12/21 0110  WBC 12.0* 11.2* 10.2  NEUTROABS 10.9*  --   --   HGB 13.3 12.5 12.4  HCT 42.8 39.1 39.5  MCV 92.2 92.2 92.9  PLT 424* 371 578   Basic Metabolic Panel: Recent Labs  Lab 05/11/21 1902 05/11/21 2240 05/12/21 0110  NA 137  --  135  K 3.9  --  3.8  CL 100  --  98  CO2 27  --  27  GLUCOSE 145*  --  191*  BUN 14  --  14  CREATININE 0.79 0.77 0.91  CALCIUM 9.0  --  8.3*   GFR: Estimated Creatinine Clearance: 43 mL/min (by C-G formula based on SCr of 0.91 mg/dL).  Liver Function Tests: No results for input(s): AST, ALT, ALKPHOS, BILITOT, PROT, ALBUMIN in the last 168 hours. No results for input(s): LIPASE, AMYLASE in the last 168 hours. No results for input(s): AMMONIA in the last 168 hours.   HbA1C: Hgb A1c MFr Bld  Date/Time Value Ref Range Status  05/17/2019 03:40 AM 5.9 (H) 4.8 - 5.6 % Final    Comment:    (NOTE) Pre diabetes:          5.7%-6.4% Diabetes:              >6.4% Glycemic control for   <7.0%  adults with diabetes   05/21/2016 05:06 AM 6.3 (H) 4.8 - 5.6 % Final    Comment:    (NOTE)         Pre-diabetes: 5.7 - 6.4         Diabetes: >6.4         Glycemic control for adults with diabetes: <7.0     Recent Results (from the past 240 hour(s))  Resp Panel by RT-PCR (Flu A&B, Covid) Nasopharyngeal Swab     Status: None   Collection Time: 05/11/21  6:28 PM   Specimen: Nasopharyngeal Swab; Nasopharyngeal(NP) swabs in vial transport medium  Result Value Ref Range Status   SARS Coronavirus 2 by RT PCR NEGATIVE NEGATIVE Final    Comment: (NOTE) SARS-CoV-2 target nucleic acids are NOT DETECTED.  The  SARS-CoV-2 RNA is generally detectable in upper respiratory specimens during the acute phase of infection. The lowest concentration of SARS-CoV-2 viral copies this assay can detect is 138 copies/mL. A negative result does not preclude SARS-Cov-2 infection and should not be used as the sole basis for treatment or other patient management decisions. A negative result may occur with  improper specimen collection/handling, submission of specimen other than nasopharyngeal swab, presence of viral mutation(s) within the areas targeted by this assay, and inadequate number of viral copies(<138 copies/mL). A negative result must be combined with clinical observations, patient history, and epidemiological information. The expected result is Negative.  Fact Sheet for Patients:  EntrepreneurPulse.com.au  Fact Sheet for Healthcare Providers:  IncredibleEmployment.be  This test is no t yet approved or cleared by the [place] FDA and  has been authorized for detection and/or diagnosis of SARS-CoV-2 by FDA under an Emergency Use Authorization (EUA). This EUA will remain  in effect (meaning this test can be used) for the duration of the COVID-19 declaration under Section 564(b)(1) of the Act, 21 U.S.C.section 360bbb-3(b)(1), unless the authorization is terminated  or revoked sooner.       Influenza A by PCR NEGATIVE NEGATIVE Final   Influenza B by PCR NEGATIVE NEGATIVE Final    Comment: (NOTE) The Xpert Xpress SARS-CoV-2/FLU/RSV plus assay is intended as an aid in the diagnosis of influenza from Nasopharyngeal swab specimens and should not be used as a sole basis for treatment. Nasal washings and aspirates are unacceptable for Xpert Xpress SARS-CoV-2/FLU/RSV testing.  Fact Sheet for Patients: EntrepreneurPulse.com.au  Fact Sheet for Healthcare Providers: IncredibleEmployment.be  This test is not yet approved or  cleared by the [place] FDA and has been authorized for detection and/or diagnosis of SARS-CoV-2 by FDA under an Emergency Use Authorization (EUA). This EUA will remain in effect (meaning this test can be used) for the duration of the COVID-19 declaration under Section 564(b)(1) of the Act, 21 U.S.C. section 360bbb-3(b)(1), unless the authorization is terminated or revoked.  Performed at [facility], [place] [address], [place], [place] [address]      Scheduled Meds:  budesonide (PULMICORT) nebulizer solution  0.25 mg Nebulization BID   enoxaparin (LOVENOX) injection  40 mg Subcutaneous Q24H   ipratropium-albuterol  3 mL Nebulization Q6H   methylPREDNISolone (SOLU-MEDROL) injection  40 mg Intravenous Q12H   Continuous Infusions:  azithromycin Stopped (05/11/21 2304)   cefTRIAXone (ROCEPHIN)  IV Stopped (05/11/21 2304)   lactated ringers 100 mL/hr at 05/12/21 0626     LOS: 0 days   Cherene Altes, MD [facility] Office  [phone] Pager - Text Page per Shea Evans  If 7PM-7AM, please contact night-coverage per  Amion 05/12/2021, 9:46 AM

## 2021-05-13 LAB — BASIC METABOLIC PANEL
Anion gap: 7 (ref 5–15)
BUN: 18 mg/dL (ref 8–23)
CO2: 30 mmol/L (ref 22–32)
Calcium: 8.5 mg/dL — ABNORMAL LOW (ref 8.9–10.3)
Chloride: 100 mmol/L (ref 98–111)
Creatinine, Ser: 0.75 mg/dL (ref 0.44–1.00)
GFR, Estimated: >60 mL/min (ref >60)
Glucose, Bld: 134 mg/dL — ABNORMAL HIGH (ref 70–99)
Potassium: 4.4 mmol/L (ref 3.5–5.1)
Sodium: 137 mmol/L (ref 135–145)

## 2021-05-13 LAB — MAGNESIUM: Magnesium: 1.7 mg/dL (ref 1.7–2.4)

## 2021-05-13 LAB — CBC
HCT: 38.7 % (ref 36.0–46.0)
Hemoglobin: 12.2 g/dL (ref 12.0–15.0)
MCH: 29.5 pg (ref 26.0–34.0)
MCHC: 31.5 g/dL (ref 30.0–36.0)
MCV: 93.5 fL (ref 80.0–100.0)
Platelets: 390 10*3/uL (ref 150–400)
RBC: 4.14 MIL/uL (ref 3.87–5.11)
RDW: 13.6 % (ref 11.5–15.5)
WBC: 13.7 10*3/uL — ABNORMAL HIGH (ref 4.0–10.5)
nRBC: 0 % (ref 0.0–0.2)

## 2021-05-13 LAB — LACTIC ACID, PLASMA: Lactic Acid, Venous: 2 mmol/L (ref 0.5–1.9)

## 2021-05-13 MED ORDER — PREDNISONE 20 MG PO TABS: 40.0000 mg | ORAL_TABLET | Freq: Every day | ORAL | Status: DC

## 2021-05-13 MED ORDER — CEFDINIR 300 MG PO CAPS
300.0000 mg | ORAL_CAPSULE | Freq: Two times a day (BID) | ORAL | Status: AC
  Administered 2021-05-14 – 2021-05-15 (×4): 300 mg via ORAL
  Filled 2021-05-13 (×4): qty 1

## 2021-05-13 MED ORDER — CLONAZEPAM 0.125 MG PO TBDP
0.2500 mg | ORAL_TABLET | Freq: Three times a day (TID) | ORAL | Status: DC | PRN
  Administered 2021-05-13 – 2021-05-15 (×3): 0.25 mg via ORAL
  Filled 2021-05-13 (×3): qty 2

## 2021-05-13 MED ORDER — METHYLPREDNISOLONE SODIUM SUCC 125 MG IJ SOLR
60.0000 mg | Freq: Two times a day (BID) | INTRAMUSCULAR | Status: AC
  Administered 2021-05-13 – 2021-05-14 (×3): 60 mg via INTRAVENOUS
  Filled 2021-05-13 (×3): qty 2

## 2021-05-13 MED ORDER — AZITHROMYCIN 250 MG PO TABS
250.0000 mg | ORAL_TABLET | Freq: Every day | ORAL | Status: AC
  Administered 2021-05-14 – 2021-05-15 (×2): 250 mg via ORAL
  Filled 2021-05-13 (×2): qty 1

## 2021-05-13 MED ORDER — ORAL CARE MOUTH RINSE
15.0000 mL | Freq: Two times a day (BID) | OROMUCOSAL | Status: DC
  Administered 2021-05-13 – 2021-05-18 (×11): 15 mL via OROMUCOSAL

## 2021-05-13 NOTE — Progress Notes (Signed)
[name]  MPN:[id number] DOB: [birth year] DOA: 05/11/2021 PCP: [name], MD    Brief Narrative:  [phone] with a history of COPD requiring 2 L nasal cannula oxygen support, RA, and HLD who presented to the ER with worsening shortness of breath and productive cough progressive over a 2-week timeframe.  A fever up to 102 had been noted at home.  Exam in the ER revealed diffuse wheezing.  There was no apparent infiltrate on CXR but the patient was producing significant discolored sputum.  WBC was elevated at 12.  Consultants:  None  Code Status: FULL CODE  Antimicrobials:  Azithromycin 1/19 > Rocephin 1/19 >  DVT prophylaxis: Lovenox  Interim Hx: Patient experienced some anxiety last night associated with coughing spells.  She is afebrile.  Vital signs are stable.  Saturations are 95% on 3 L nasal cannula.  Still appears to be in mild respiratory distress at visit with obvious pursed lip breathing and wheezing.  Not in extremitas.  Able to carry on a conversation without difficulty.  Reports feeling "about the same" as yesterday.  Denies chest pain nausea vomiting or abdominal pain.  Assessment & Plan:  COPD with chronic hypoxic respiratory failure with acute bronchospastic exacerbation Slow to improve -increase steroid dose -continue inhaled bronchodilators - monitor trend - baseline home oxygen dose is 2 L -manage anxiety  Situational anxiety versus generalized anxiety disorder It is certainly understandable the patient will be anxious given her persisting dyspnea -carefully titrate medical therapy to attempt to maximize respiratory mechanics while avoiding respiratory suppression  Community-acquired pneumonia Continue empiric antibiotic therapy   RA on chronic steroid therapy No evidence of acute flare at present  HLD Continue home medical therapy  Family Communication: No family present at time of exam Disposition: From home -we will need PT/OT evaluations to  determine best disposition for discharge as she improves  Objective: Blood pressure (!) 145/62, pulse 82, temperature 98 F (36.7 C), temperature source Oral, resp. rate 17, height 5' (1.524 m), weight 77.1 kg, SpO2 95 %.  Intake/Output Summary (Last 24 hours) at 05/13/2021 1047 Last data filed at 05/13/2021 0539 Gross per 24 hour  Intake 1790.64 ml  Output 1250 ml  Net 540.64 ml    Filed Weights   05/11/21 1825  Weight: 77.1 kg    Examination: General: Exhibiting pursed lip breathing, alert and oriented, able to carry on a conversation Lungs: Diffuse expiratory wheezing, poor air movement diffusely, no focal crackles, breath sounds equal bilaterally Cardiovascular: Regular rate and rhythm without murmur gallop or rub normal S1 and S2 Abdomen: NT/ND, soft, BS positive, no rebound Extremities: Trace edema bilateral lower extremities  CBC: Recent Labs  Lab 05/11/21 1902 05/11/21 2240 05/12/21 0110 05/13/21 0528  WBC 12.0* 11.2* 10.2 13.7*  NEUTROABS 10.9*  --   --   --   HGB 13.3 12.5 12.4 12.2  HCT 42.8 39.1 39.5 38.7  MCV 92.2 92.2 92.9 93.5  PLT 424* 371 382 761    Basic Metabolic Panel: Recent Labs  Lab 05/11/21 1902 05/11/21 2240 05/12/21 0110 05/13/21 0528  NA 137  --  135 137  K 3.9  --  3.8 4.4  CL 100  --  98 100  CO2 27  --  27 30  GLUCOSE 145*  --  191* 134*  BUN 14  --  14 18  CREATININE 0.79 0.77 0.91 0.75  CALCIUM 9.0  --  8.3* 8.5*  MG  --   --   --  1.7    GFR: Estimated Creatinine Clearance: 48.9 mL/min (by C-G formula based on SCr of 0.75 mg/dL).   HbA1C: Hgb A1c MFr Bld  Date/Time Value Ref Range Status  05/17/2019 03:40 AM 5.9 (H) 4.8 - 5.6 % Final    Comment:    (NOTE) Pre diabetes:          5.7%-6.4% Diabetes:              >6.4% Glycemic control for   <7.0% adults with diabetes   05/21/2016 05:06 AM 6.3 (H) 4.8 - 5.6 % Final    Comment:    (NOTE)         Pre-diabetes: 5.7 - 6.4         Diabetes: >6.4         Glycemic  control for adults with diabetes: <7.0     Recent Results (from the past 240 hour(s))  Resp Panel by RT-PCR (Flu A&B, Covid) Nasopharyngeal Swab     Status: None   Collection Time: 05/11/21  6:28 PM   Specimen: Nasopharyngeal Swab; Nasopharyngeal(NP) swabs in vial transport medium  Result Value Ref Range Status   SARS Coronavirus 2 by RT PCR NEGATIVE NEGATIVE Final    Comment: (NOTE) SARS-CoV-2 target nucleic acids are NOT DETECTED.  The SARS-CoV-2 RNA is generally detectable in upper respiratory specimens during the acute phase of infection. The lowest concentration of SARS-CoV-2 viral copies this assay can detect is 138 copies/mL. A negative result does not preclude SARS-Cov-2 infection and should not be used as the sole basis for treatment or other patient management decisions. A negative result may occur with  improper specimen collection/handling, submission of specimen other than nasopharyngeal swab, presence of viral mutation(s) within the areas targeted by this assay, and inadequate number of viral copies(<138 copies/mL). A negative result must be combined with clinical observations, patient history, and epidemiological information. The expected result is Negative.  Fact Sheet for Patients:  EntrepreneurPulse.com.au  Fact Sheet for Healthcare Providers:  IncredibleEmployment.be  This test is no t yet approved or cleared by the [place] FDA and  has been authorized for detection and/or diagnosis of SARS-CoV-2 by FDA under an Emergency Use Authorization (EUA). This EUA will remain  in effect (meaning this test can be used) for the duration of the COVID-19 declaration under Section 564(b)(1) of the Act, 21 U.S.C.section 360bbb-3(b)(1), unless the authorization is terminated  or revoked sooner.       Influenza A by PCR NEGATIVE NEGATIVE Final   Influenza B by PCR NEGATIVE NEGATIVE Final    Comment: (NOTE) The Xpert Xpress  SARS-CoV-2/FLU/RSV plus assay is intended as an aid in the diagnosis of influenza from Nasopharyngeal swab specimens and should not be used as a sole basis for treatment. Nasal washings and aspirates are unacceptable for Xpert Xpress SARS-CoV-2/FLU/RSV testing.  Fact Sheet for Patients: EntrepreneurPulse.com.au  Fact Sheet for Healthcare Providers: IncredibleEmployment.be  This test is not yet approved or cleared by the [place] FDA and has been authorized for detection and/or diagnosis of SARS-CoV-2 by FDA under an Emergency Use Authorization (EUA). This EUA will remain in effect (meaning this test can be used) for the duration of the COVID-19 declaration under Section 564(b)(1) of the Act, 21 U.S.C. section 360bbb-3(b)(1), unless the authorization is terminated or revoked.  Performed at [facility], [place] [address] [place], [place] [address]   Culture, blood (routine x 2)     Status: None (Preliminary result)   Collection Time:  05/11/21  7:02 PM   Specimen: Right Antecubital; Blood  Result Value Ref Range Status   Specimen Description   Final    RIGHT ANTECUBITAL Performed at [facility], [place] [address], [place], [place] [address]    Special Requests   Final    BOTTLES DRAWN AEROBIC AND ANAEROBIC Blood Culture results may not be optimal due to an excessive volume of blood received in culture bottles Performed at [place] [address], [place], [place] [address]    Culture   Final    NO GROWTH 2 DAYS Performed at [place] [address], [place], [place] [address]    Report Status PENDING  Incomplete  Culture, blood (routine x 2)     Status: None (Preliminary result)   Collection Time: 05/11/21  7:02 PM   Specimen: Left Antecubital; Blood  Result Value Ref Range Status   Specimen Description   Final    LEFT ANTECUBITAL Performed at [place] [address], [place], [place] [address]    Special Requests   Final    BOTTLES DRAWN AEROBIC AND ANAEROBIC Blood Culture results may not be optimal due to an excessive volume of blood received in culture bottles Performed at [place] [address], [place], [place] [address]    Culture   Final    NO GROWTH 2 DAYS Performed at [place] [address], [place], [place] [address]    Report Status PENDING  Incomplete     Scheduled Meds:  budesonide (PULMICORT) nebulizer solution  0.25 mg Nebulization BID   calcium-vitamin D  1 tablet Oral Daily   enoxaparin (LOVENOX) injection  40 mg Subcutaneous Q24H   ipratropium-albuterol  3 mL Nebulization Q6H   mouth rinse  15 mL Mouth Rinse BID   methylPREDNISolone (SOLU-MEDROL) injection  40 mg Intravenous Q12H   pantoprazole  40 mg Oral Daily   Continuous Infusions:  azithromycin 500 mg (05/12/21 2150)   cefTRIAXone (ROCEPHIN)  IV 2 g (05/12/21 2019)     LOS: 1 day   Cherene Altes, MD [facility] Office  [phone] Pager - Text Page per Shea Evans  If 7PM-7AM, please contact night-coverage per Amion 05/13/2021, 10:47 AM

## 2021-05-13 NOTE — Plan of Care (Signed)
Pt experiencing anxiety during a coughing spell at the beginning of the shift. Pt states she would like some medication to help. Hospitalist ordered melatonin to help with sleep.  Problem: Activity: Goal: Ability to tolerate increased activity will improve Outcome: Progressing   Problem: Clinical Measurements: Goal: Ability to maintain a body temperature in the normal range will improve Outcome: Progressing   Problem: Respiratory: Goal: Ability to maintain adequate ventilation will improve Outcome: Progressing Goal: Ability to maintain a clear airway will improve Outcome: Progressing   Problem: Education: Goal: Knowledge of General Education information will improve Description: Including pain rating scale, medication(s)/side effects and non-pharmacologic comfort measures Outcome: Progressing   Problem: Health Behavior/Discharge Planning: Goal: Ability to manage health-related needs will improve Outcome: Progressing   Problem: Clinical Measurements: Goal: Ability to maintain clinical measurements within normal limits will improve Outcome: Progressing Goal: Will remain free from infection Outcome: Progressing Goal: Diagnostic test results will improve Outcome: Progressing Goal: Respiratory complications will improve Outcome: Progressing Goal: Cardiovascular complication will be avoided Outcome: Progressing   Problem: Activity: Goal: Risk for activity intolerance will decrease Outcome: Progressing   Problem: Nutrition: Goal: Adequate nutrition will be maintained Outcome: Progressing   Problem: Coping: Goal: Level of anxiety will decrease Outcome: Progressing   Problem: Elimination: Goal: Will not experience complications related to bowel motility Outcome: Progressing Goal: Will not experience complications related to urinary retention Outcome: Progressing   Problem: Pain Managment: Goal: General experience of comfort will improve Outcome: Progressing   Problem:  Safety: Goal: Ability to remain free from injury will improve Outcome: Progressing   Problem: Skin Integrity: Goal: Risk for impaired skin integrity will decrease Outcome: Progressing

## 2021-05-14 MED ORDER — PREDNISONE 50 MG PO TABS
60.0000 mg | ORAL_TABLET | Freq: Every day | ORAL | Status: DC
  Administered 2021-05-15 – 2021-05-18 (×4): 60 mg via ORAL
  Filled 2021-05-14 (×4): qty 1

## 2021-05-14 MED ORDER — IPRATROPIUM-ALBUTEROL 0.5-2.5 (3) MG/3ML IN SOLN
3.0000 mL | Freq: Two times a day (BID) | RESPIRATORY_TRACT | Status: DC
  Administered 2021-05-14 – 2021-05-16 (×4): 3 mL via RESPIRATORY_TRACT
  Filled 2021-05-14 (×4): qty 3

## 2021-05-14 NOTE — Plan of Care (Signed)
No acute events overnight. Pt does not report any anxiety this shift.  Problem: Activity: Goal: Ability to tolerate increased activity will improve Outcome: Progressing   Problem: Clinical Measurements: Goal: Ability to maintain a body temperature in the normal range will improve Outcome: Progressing   Problem: Respiratory: Goal: Ability to maintain adequate ventilation will improve Outcome: Progressing Goal: Ability to maintain a clear airway will improve Outcome: Progressing   Problem: Education: Goal: Knowledge of General Education information will improve Description: Including pain rating scale, medication(s)/side effects and non-pharmacologic comfort measures Outcome: Progressing   Problem: Health Behavior/Discharge Planning: Goal: Ability to manage health-related needs will improve Outcome: Progressing   Problem: Clinical Measurements: Goal: Ability to maintain clinical measurements within normal limits will improve Outcome: Progressing Goal: Will remain free from infection Outcome: Progressing Goal: Diagnostic test results will improve Outcome: Progressing Goal: Respiratory complications will improve Outcome: Progressing Goal: Cardiovascular complication will be avoided Outcome: Progressing   Problem: Activity: Goal: Risk for activity intolerance will decrease Outcome: Progressing   Problem: Nutrition: Goal: Adequate nutrition will be maintained Outcome: Progressing   Problem: Coping: Goal: Level of anxiety will decrease Outcome: Progressing   Problem: Elimination: Goal: Will not experience complications related to bowel motility Outcome: Progressing Goal: Will not experience complications related to urinary retention Outcome: Progressing   Problem: Pain Managment: Goal: General experience of comfort will improve Outcome: Progressing   Problem: Safety: Goal: Ability to remain free from injury will improve Outcome: Progressing   Problem: Skin  Integrity: Goal: Risk for impaired skin integrity will decrease Outcome: Progressing

## 2021-05-14 NOTE — Progress Notes (Signed)
[name]  ZMO:[id number] DOB: 12/27/1935 DOA: 05/11/2021 PCP: [name], MD    Brief Narrative:  [phone] with a history of COPD requiring 2 L nasal cannula oxygen support, RA, and HLD who presented to the ER with worsening shortness of breath and productive cough progressive over a 2-week timeframe.  A fever up to 102 had been noted at home.  Exam in the ER revealed diffuse wheezing.  There was no apparent infiltrate on CXR but the patient was producing significant discolored sputum.  WBC was elevated at 12.  Consultants:  None  Code Status: FULL CODE  Antimicrobials:  Azithromycin 1/19 > Rocephin 1/19 >  DVT prophylaxis: Lovenox  Interim Hx: No acute events reported overnight.  Afebrile.  Continues to require 3 L nasal cannula oxygen support to keep sats in the upper 90s.  No new laboratory data today.  Getting up some with mobility team but is quite slow/intentional when doing so.  Shortness of breath slowly but consistently improving.  Denies chest pain.  Assessment & Plan:  COPD with chronic hypoxic respiratory failure with acute bronchospastic exacerbation Slow to improve -increased steroid dose 1/21 - continue inhaled bronchodilators - monitor trend - baseline home oxygen dose is 2 L - manage anxiety -respiratory status not yet stable enough to allow discharge home  Situational anxiety versus generalized anxiety disorder It is certainly understandable the patient would be anxious given her persisting dyspnea - carefully titrate medical therapy to attempt to maximize respiratory mechanics while avoiding respiratory suppression  Community- acquired pneumonia Continue empiric antibiotic therapy for 5 days of tx   RA on chronic steroid therapy No evidence of acute flare at present  HLD Continue home medical therapy  Family Communication: No family present at time of exam Disposition: From home -we will need PT/OT evaluations to determine best disposition for  discharge as she improves  Objective: Blood pressure (!) 148/76, pulse 90, temperature 97.8 F (36.6 C), temperature source Oral, resp. rate 14, height 5' (1.524 m), weight 77.1 kg, SpO2 97 %.  Intake/Output Summary (Last 24 hours) at 05/14/2021 1025 Last data filed at 05/14/2021 0310 Gross per 24 hour  Intake --  Output 650 ml  Net -650 ml    Filed Weights   05/11/21 1825  Weight: 77.1 kg    Examination: General: Not in extremis Lungs: Mild expiratory wheezing with improved air movement without focal crackles Cardiovascular: Regular rate and rhythm without murmur gallop or rub normal S1 and S2 Abdomen: NT/ND, soft, BS positive, no rebound Extremities: Trace edema bilateral lower extremities  CBC: Recent Labs  Lab 05/11/21 1902 05/11/21 2240 05/12/21 0110 05/13/21 0528  WBC 12.0* 11.2* 10.2 13.7*  NEUTROABS 10.9*  --   --   --   HGB 13.3 12.5 12.4 12.2  HCT 42.8 39.1 39.5 38.7  MCV 92.2 92.2 92.9 93.5  PLT 424* 371 382 656    Basic Metabolic Panel: Recent Labs  Lab 05/11/21 1902 05/11/21 2240 05/12/21 0110 05/13/21 0528  NA 137  --  135 137  K 3.9  --  3.8 4.4  CL 100  --  98 100  CO2 27  --  27 30  GLUCOSE 145*  --  191* 134*  BUN 14  --  14 18  CREATININE 0.79 0.77 0.91 0.75  CALCIUM 9.0  --  8.3* 8.5*  MG  --   --   --  1.7    GFR: Estimated Creatinine Clearance: 48.9 mL/min (by C-G formula  based on SCr of 0.75 mg/dL).   HbA1C: Hgb A1c MFr Bld  Date/Time Value Ref Range Status  05/17/2019 03:40 AM 5.9 (H) 4.8 - 5.6 % Final    Comment:    (NOTE) Pre diabetes:          5.7%-6.4% Diabetes:              >6.4% Glycemic control for   <7.0% adults with diabetes   05/21/2016 05:06 AM 6.3 (H) 4.8 - 5.6 % Final    Comment:    (NOTE)         Pre-diabetes: 5.7 - 6.4         Diabetes: >6.4         Glycemic control for adults with diabetes: <7.0     Blood Culture    Component Value Date/Time   SDES  05/11/2021 1902    RIGHT  ANTECUBITAL Performed at [facility], [place] [address] [place], [place] [address]    SDES  05/11/2021 1902    LEFT ANTECUBITAL Performed at [facility], [place] [address] [place], [place] [address]    [place]  05/11/2021 1902    BOTTLES DRAWN AEROBIC AND ANAEROBIC Blood Culture results may not be optimal due to an excessive volume of blood received in culture bottles Performed at [facility], [place] [address], [place], [place] [address]    [place]  05/11/2021 1902    BOTTLES DRAWN AEROBIC AND ANAEROBIC Blood Culture results may not be optimal due to an excessive volume of blood received in culture bottles Performed at [facility], [place] [address], [place], [place] [address]    CULT  05/11/2021 1902    NO GROWTH 3 DAYS Performed at [place] [address] [place], [place] [address]    CULT  05/11/2021 1902    NO GROWTH 3 DAYS Performed at [facility], [place] [address] [place], [place] [address]    REPTSTATUS PENDING 05/11/2021 1902   REPTSTATUS PENDING 05/11/2021 1902      Scheduled Meds:  azithromycin  250 mg Oral Daily   budesonide (PULMICORT) nebulizer solution  0.25 mg Nebulization BID   calcium-vitamin D  1 tablet Oral Daily   cefdinir  300 mg Oral Q12H   enoxaparin (LOVENOX) injection  40 mg Subcutaneous Q24H   ipratropium-albuterol  3 mL Nebulization BID   mouth rinse  15 mL Mouth Rinse BID   methylPREDNISolone (SOLU-MEDROL) injection  60 mg Intravenous Q12H   pantoprazole  40 mg Oral Daily     LOS: 2 days   Cherene Altes, MD [facility] Office  [phone] Pager - Text Page per Shea Evans  If 7PM-7AM, please contact night-coverage per Amion 05/14/2021, 10:25 AM

## 2021-05-15 MED ORDER — LIP MEDEX EX OINT
TOPICAL_OINTMENT | CUTANEOUS | Status: DC | PRN
  Administered 2021-05-15: 75 via TOPICAL
  Filled 2021-05-15: qty 7

## 2021-05-15 NOTE — Evaluation (Signed)
Occupational Therapy Evaluation Patient Details Name: [name] MRN: [id number] DOB: 1936/02/13 Today's Date: 05/15/2021   History of Present Illness 86yo with a history of COPD requiring 2 L nasal cannula oxygen support, RA, and HLD who presented to the ER with worsening shortness of breath and productive cough progressive over a 2-week timeframe.  A fever up to 102 had been noted at home. Dx of COPD with chronic hypoxic respiratory failure with acute bronchospastic exacerbation.   Clinical Impression   Chart reviewed, pt greeted in bed agreeable to session. Prior to mobilization pt reporting chest pain, reporting it feels heavy; unable to provide a number. RN notified and respiratory present to provide breathing treatment. Pt vital signs stable. Pt then cleared for mobility, performed supine>sit with MOD I, functional ambulation to bedside commode approx 5 feet from bed (due to urgency of need for the bathroom, O2 extender not in room at the time). Pt able to perform ADL tasks with SET UP-CGA with the exception of LB dressing in which she requires MOD A. Pt endorses this is more help than she typically requires. Pt educated on role of OT, role of rehab, home safety, EC techniques; will continue to benefit from further education. Carry over noted for energy conservation techniques during ADL completion and pt states she has learned techniques in past rehab stays. Pt is left in bedside chair, NAD, all needs met. Pt will benefit from skilled OT while in the hospital to address functional deficits and to implement ADL completion with EC techniques. No further OT recommended following discharge.      Recommendations for follow up therapy are one component of a multi-disciplinary discharge planning process, led by the attending physician.  Recommendations may be updated based on patient status, additional functional criteria and insurance authorization.   Follow Up Recommendations  No OT follow up     Assistance Recommended at Discharge Intermittent Supervision/Assistance  Patient can return home with the following A little help with walking and/or transfers;Assistance with cooking/housework;Help with stairs or ramp for entrance;A little help with bathing/dressing/bathroom    Functional Status Assessment  Patient has had a recent decline in their functional status and demonstrates the ability to make significant improvements in function in a reasonable and predictable amount of time.  Equipment Recommendations  Other (comment);None recommended by OT (pt has recommended equipment at home already)    Recommendations for Other Services       Precautions / Restrictions Precautions Precautions: Fall Precaution Comments: "I fall all the time." Restrictions Weight Bearing Restrictions: No      Mobility Bed Mobility Overal bed mobility: Modified Independent             General bed mobility comments: HOB raised    Transfers Overall transfer level: Needs assistance Equipment used: Rolling walker (2 wheels) Transfers: Sit to/from Stand Sit to Stand: Min guard                  Balance Overall balance assessment: History of Falls, Needs assistance   Sitting balance-Leahy Scale: Normal     Standing balance support: Bilateral upper extremity supported, During functional activity Standing balance-Leahy Scale: Good                             ADL either performed or assessed with clinical judgement   ADL Overall ADL's : Needs assistance/impaired Eating/Feeding: Set up   Grooming: Wash/dry hands;Wash/dry face;Oral care;Supervision/safety;Standing Grooming Details (indicate cue  type and reason): sink level, vital signs stable throughout     Lower Body Bathing: Minimal assistance;Sitting/lateral leans   Upper Body Dressing : Sitting;Set up Upper Body Dressing Details (indicate cue type and reason): robe in sitting Lower Body Dressing: Moderate  assistance;Sit to/from stand Lower Body Dressing Details (indicate cue type and reason): for underwear and pj pants Toilet Transfer: Min guard;Rolling walker (2 wheels);Ambulation;BSC/3in1   Toileting- Water quality scientist and Hygiene: Min guard;Sit to/from stand       Functional mobility during ADLs: Min guard;Rolling walker (2 wheels)       Vision Patient Visual Report: No change from baseline       Perception     Praxis      Pertinent Vitals/Pain Pain Assessment Pain Assessment: Faces Faces Pain Scale: Hurts a little bit Pain Location: chest Pain Descriptors / Indicators: Headache Pain Intervention(s): Other (comment) (RN notified, respiratory present to provide breathing treatment prior to mobilization. [place] for mobilization per [employer]. VSS throughout.)     Hand Dominance     Extremity/Trunk Assessment Upper Extremity Assessment Upper Extremity Assessment: Generalized weakness   Lower Extremity Assessment Lower Extremity Assessment: Generalized weakness   Cervical / Trunk Assessment Cervical / Trunk Assessment: Normal   Communication Communication Communication: HOH   Cognition Arousal/Alertness: Awake/alert   Overall Cognitive Status: Within Functional Limits for tasks assessed                                 General Comments: alert and oriented x4     General Comments  prior to mobility vital signs are as follows: 95% spo2, HR 86, BP 136/63; Pt with HR up to 128 with mobility/BM, spo2 97%; HR recovered with rest    Exercises Other Exercises Other Exercises: educated on role of OT, role of rehab, home safety, EC techniques; will continue to benefit from education   Shoulder Instructions      Home Living Family/patient expects to be discharged to:: Private residence Living Arrangements: Other (Comment) (lives with son in law and grand daugther) Available Help at Discharge: Family;Available PRN/intermittently Type of Home: House Home  Access: Stairs to enter Entrance Stairs-Number of Steps: 1   Home Layout: Two level;Bed/bath upstairs Alternate Level Stairs-Number of Steps: pt has chair lift to upstairs   Bathroom Shower/Tub: Walk-in shower         Home Equipment: Rollator (4 wheels);Shower seat;Electric scooter;BSC/3in1   Additional Comments: has lift chair, chair lift on stairs; lives with 36 year old granddaughter and son in law who is not able to physically assist      Prior Functioning/Environment Prior Level of Function : Driving;Independent/Modified Independent             Mobility Comments: rollator in the home, scooter for community distances, cane for front door to car ADLs Comments: pt generally MOD I in ADLs, intermittent assist when needed        OT Problem List: Decreased strength;Decreased activity tolerance;Cardiopulmonary status limiting activity      OT Treatment/Interventions: Self-care/ADL training;DME and/or AE instruction;Therapeutic activities;Therapeutic exercise;Energy conservation;Patient/family education    OT Goals(Current goals can be found in the care plan section) Acute Rehab OT Goals Patient Stated Goal: to go home OT Goal Formulation: With patient Time For Goal Achievement: 05/29/21 Potential to Achieve Goals: Good ADL Goals Pt Will Perform Grooming: with modified independence;standing;sitting Pt Will Perform Lower Body Dressing: with modified independence;sitting/lateral leans;sit to/from stand Pt Will Transfer  to Toilet: with modified independence Pt Will Perform Toileting - Clothing Manipulation and hygiene: with modified independence  OT Frequency: Min 2X/week    Co-evaluation              AM-PAC OT "6 Clicks" Daily Activity     Outcome Measure Help from another person eating meals?: None Help from another person taking care of personal grooming?: None Help from another person toileting, which includes using toliet, bedpan, or urinal?: A Little Help  from another person bathing (including washing, rinsing, drying)?: A Little Help from another person to put on and taking off regular upper body clothing?: None Help from another person to put on and taking off regular lower body clothing?: A Lot 6 Click Score: 20   End of Session Equipment Utilized During Treatment: Gait belt;Rolling walker (2 wheels) Nurse Communication: Mobility status  Activity Tolerance: Patient tolerated treatment well Patient left: in chair;with call bell/phone within reach;with chair alarm set  OT Visit Diagnosis: Unsteadiness on feet (R26.81);History of falling (Z91.81)                Time: 6838-7065 OT Time Calculation (min): 43 min Charges:  OT General Charges $OT Visit: 1 Visit OT Evaluation $OT Eval Moderate Complexity: 1 Mod OT Treatments $Self Care/Home Management : 8-22 mins Shanon Payor, OTD OTR/L  05/15/21, 12:07 PM

## 2021-05-15 NOTE — Progress Notes (Signed)
[name]  HYQ:[id number] DOB: 1935-05-09 DOA: 05/11/2021 PCP: [name], MD    Brief Narrative:  [phone] with a history of COPD requiring 2 L nasal cannula oxygen support, RA, and HLD who presented to the ER with worsening shortness of breath and productive cough progressive over a 2-week timeframe.  A fever up to 102 had been noted at home.  Exam in the ER revealed diffuse wheezing.  There was no apparent infiltrate on CXR but the patient was producing significant discolored sputum.  WBC was elevated at 12.  Consultants:  None  Code Status: FULL CODE  Antimicrobials:  Azithromycin 1/19 > Rocephin 1/19 >  DVT prophylaxis: Lovenox  Interim Hx: Afebrile.  Vital stable.  Saturation 96-100% on 3 L conventional nasal cannula.  Patient was able to walk in the hallway with assistance yesterday and tolerated this well.  Slowly improving overall.  Reports ongoing episodes during the day of severe dyspnea.  Denies chest pain.  Feels that she has improved since admission but not yet back to her baseline.  Assessment & Plan:  COPD with chronic hypoxic respiratory failure with acute bronchospastic exacerbation Slow to improve -increased steroid dose 1/21 - continue inhaled bronchodilators - monitor trend - baseline home oxygen dose is 2 L - manage anxiety -respiratory status not yet stable enough to allow discharge home  Situational anxiety versus generalized anxiety disorder It is certainly understandable the patient would be anxious given her persisting dyspnea - carefully titrate medical therapy to attempt to maximize respiratory mechanics while avoiding respiratory suppression  Community- acquired pneumonia Continue empiric antibiotic therapy for 5 days of tx   RA on chronic steroid therapy No evidence of acute flare at present  HLD Continue home medical therapy  Family Communication: Spoke with patient and her sister at bedside Disposition: From home - we will need PT/OT  evaluations to determine best disposition for discharge as she improves  Objective: Blood pressure (!) 154/87, pulse 85, temperature 97.7 F (36.5 C), temperature source Oral, resp. rate 16, height 5' (1.524 m), weight 77.1 kg, SpO2 96 %.  Intake/Output Summary (Last 24 hours) at 05/15/2021 0932 Last data filed at 05/15/2021 4132 Gross per 24 hour  Intake 600 ml  Output 950 ml  Net -350 ml    Filed Weights   05/11/21 1825  Weight: 77.1 kg    Examination: General: Not in extremis -able to complete full sentences Lungs: Distant breath sounds in all fields with no active wheezing and no focal crackles Cardiovascular: Regular rate and rhythm without murmur gallop or rub normal S1 and S2 Abdomen: NT/ND, soft, BS positive, no rebound Extremities: Trace edema B LE without change  CBC: Recent Labs  Lab 05/11/21 1902 05/11/21 2240 05/12/21 0110 05/13/21 0528  WBC 12.0* 11.2* 10.2 13.7*  NEUTROABS 10.9*  --   --   --   HGB 13.3 12.5 12.4 12.2  HCT 42.8 39.1 39.5 38.7  MCV 92.2 92.2 92.9 93.5  PLT 424* 371 382 440    Basic Metabolic Panel: Recent Labs  Lab 05/11/21 1902 05/11/21 2240 05/12/21 0110 05/13/21 0528  NA 137  --  135 137  K 3.9  --  3.8 4.4  CL 100  --  98 100  CO2 27  --  27 30  GLUCOSE 145*  --  191* 134*  BUN 14  --  14 18  CREATININE 0.79 0.77 0.91 0.75  CALCIUM 9.0  --  8.3* 8.5*  MG  --   --   --  1.7    GFR: Estimated Creatinine Clearance: 48.9 mL/min (by C-G formula based on SCr of 0.75 mg/dL).   HbA1C: Hgb A1c MFr Bld  Date/Time Value Ref Range Status  05/17/2019 03:40 AM 5.9 (H) 4.8 - 5.6 % Final    Comment:    (NOTE) Pre diabetes:          5.7%-6.4% Diabetes:              >6.4% Glycemic control for   <7.0% adults with diabetes   05/21/2016 05:06 AM 6.3 (H) 4.8 - 5.6 % Final    Comment:    (NOTE)         Pre-diabetes: 5.7 - 6.4         Diabetes: >6.4         Glycemic control for adults with diabetes: <7.0     Blood Culture     Component Value Date/Time   SDES  05/11/2021 1902    RIGHT ANTECUBITAL Performed at [facility], [place] [address], [place], [place] [address]    SDES  05/11/2021 1902    LEFT ANTECUBITAL Performed at [facility], [place] [address] [place], [place] [address]    [place]  05/11/2021 1902    BOTTLES DRAWN AEROBIC AND ANAEROBIC Blood Culture results may not be optimal due to an excessive volume of blood received in culture bottles Performed at [facility], [place] [address] [place], [place] [address]    [place]  05/11/2021 1902    BOTTLES DRAWN AEROBIC AND ANAEROBIC Blood Culture results may not be optimal due to an excessive volume of blood received in culture bottles Performed at [facility], [place] [address] [place], [place] [address]    CULT  05/11/2021 1902    NO GROWTH 4 DAYS Performed at [place] [address], [place], [place] [address]    CULT  05/11/2021 1902    NO GROWTH 4 DAYS Performed at [place] [address], [place], [place] [address]    REPTSTATUS PENDING 05/11/2021 1902   REPTSTATUS PENDING 05/11/2021 1902      Scheduled Meds:  azithromycin  250 mg Oral Daily   budesonide (PULMICORT) nebulizer solution  0.25 mg Nebulization BID   calcium-vitamin D  1 tablet Oral Daily   cefdinir  300 mg Oral Q12H   enoxaparin (LOVENOX) injection  40 mg Subcutaneous Q24H   ipratropium-albuterol  3 mL Nebulization BID   mouth rinse  15 mL Mouth Rinse BID   pantoprazole  40 mg Oral Daily   predniSONE  60 mg Oral Q breakfast     LOS: 3 days   Cherene Altes, MD [facility] Office  [phone] Pager - Text Page per Shea Evans  If 7PM-7AM, please contact night-coverage per Amion 05/15/2021, 9:32 AM

## 2021-05-15 NOTE — Evaluation (Signed)
Physical Therapy Evaluation Patient Details Name: [name] MRN: [id number] DOB: April 13, 1936 Today's Date: 05/15/2021  History of Present Illness  86yo with a history of COPD requiring 2 L nasal cannula oxygen support, RA, and HLD who presented to the ER with worsening shortness of breath and productive cough progressive over a 2-week timeframe.  A fever up to 102 had been noted at home. Dx of COPD with chronic hypoxic respiratory failure with acute bronchospastic exacerbation.   Clinical Impression  Pt admitted with above diagnosis. Pt ambulated 100' with RW without loss of balance, SaO2 88% on 3L O2 walking, 95% on 3L O2 at rest. Verbal cues for pursed lip breathing as pt tends to inhale through mouth. Pt stated she's been to rehab multiple times for her frequent falls and that it "hasn't helped", she declined HHPT.  Pt currently with functional limitations due to the deficits listed below (see PT Problem List). Pt will benefit from skilled PT to increase their independence and safety with mobility to allow discharge to the venue listed below.          Recommendations for follow up therapy are one component of a multi-disciplinary discharge planning process, led by the attending physician.  Recommendations may be updated based on patient status, additional functional criteria and insurance authorization.  Follow Up Recommendations No PT follow up (pt declined HHPT)    Assistance Recommended at Discharge Set up Supervision/Assistance  Patient can return home with the following  Assist for transportation;A little help with bathing/dressing/bathroom    Equipment Recommendations None recommended by PT  Recommendations for Other Services       Functional Status Assessment Patient has had a recent decline in their functional status and demonstrates the ability to make significant improvements in function in a reasonable and predictable amount of time.     Precautions / Restrictions  Precautions Precautions: Fall Precaution Comments: "I fall all the time." Restrictions Weight Bearing Restrictions: No      Mobility  Bed Mobility Overal bed mobility: Modified Independent             General bed mobility comments: HOB up, used rail    Transfers Overall transfer level: Needs assistance Equipment used: Rolling walker (2 wheels) Transfers: Sit to/from Stand Sit to Stand: Supervision           General transfer comment: supervision 2* h/o falls, no assist needed    Ambulation/Gait Ambulation/Gait assistance: Supervision Gait Distance (Feet): 100 Feet Assistive device: Rolling walker (2 wheels) Gait Pattern/deviations: Step-through pattern, Decreased stride length Gait velocity: decr     General Gait Details: distance limited by 3/4 dyspnea, SaO2 95% on 3L at rest, 88% on 3L with walking, VCs for pursed lip breathing as pt tends to inhale through mouth  Stairs            Wheelchair Mobility    Modified Rankin (Stroke Patients Only)       Balance Overall balance assessment: Modified Independent, History of Falls                                           Pertinent Vitals/Pain Pain Assessment Pain Assessment: No/denies pain    Home Living Family/patient expects to be discharged to:: Private residence Living Arrangements: Other (Comment) Available Help at Discharge: Family;Available PRN/intermittently Type of Home: House Home Access: Stairs to enter   [employer] of  Steps: 1   Home Layout: Two level;Bed/bath upstairs Home Equipment: Wheelchair - power;Rollator (4 wheels);Shower seat Additional Comments: has lift chair, chair lift on stairs; lives with 84 year old granddaughter and son in law who is not able to physically assist    Prior Function Prior Level of Function : Driving;Independent/Modified Independent             Mobility Comments: walks with rollator, on 2L home O2 (3L "when it gets  bad"); has just been walking short distances PTA ADLs Comments: able to bathe independently, uses shower seat, granddaughter assists with dressing.     Hand Dominance        Extremity/Trunk Assessment   Upper Extremity Assessment Upper Extremity Assessment: Defer to OT evaluation    Lower Extremity Assessment Lower Extremity Assessment: Generalized weakness (B knee ext -4/5)    Cervical / Trunk Assessment Cervical / Trunk Assessment: Normal  Communication   Communication: HOH  Cognition Arousal/Alertness: Awake/alert Behavior During Therapy: WFL for tasks assessed/performed Overall Cognitive Status: Within Functional Limits for tasks assessed                                          General Comments      Exercises     Assessment/Plan    PT Assessment Patient needs continued PT services  PT Problem List Decreased strength;Decreased mobility;Decreased activity tolerance;Decreased range of motion;Pain;Decreased balance       PT Treatment Interventions Gait training;Therapeutic exercise;Patient/family education;Therapeutic activities;Balance training    PT Goals (Current goals can be found in the Care Plan section)  Acute Rehab PT Goals Patient Stated Goal: DC home PT Goal Formulation: With patient Time For Goal Achievement: 05/29/21 Potential to Achieve Goals: Good    Frequency Min 3X/week     Co-evaluation               AM-PAC PT "6 Clicks" Mobility  Outcome Measure Help needed turning from your back to your side while in a flat bed without using bedrails?: None Help needed moving from lying on your back to sitting on the side of a flat bed without using bedrails?: None Help needed moving to and from a bed to a chair (including a wheelchair)?: A Little Help needed standing up from a chair using your arms (e.g., wheelchair or bedside chair)?: A Little Help needed to walk in hospital room?: A Little Help needed climbing 3-5 steps with  a railing? : A Little 6 Click Score: 20    End of Session Equipment Utilized During Treatment: Gait belt Activity Tolerance: Patient limited by fatigue Patient left: in bed;with call bell/phone within reach;with bed alarm set Nurse Communication: Mobility status PT Visit Diagnosis: Difficulty in walking, not elsewhere classified (R26.2)    Time: 6962-9528 PT Time Calculation (min) (ACUTE ONLY): 29 min   Charges:   PT Evaluation $PT Eval Moderate Complexity: 1 Mod PT Treatments $Gait Training: 8-22 mins       Blondell Reveal Kistler PT 05/15/2021  Acute Rehabilitation Services Pager [phone] Office [phone]

## 2021-05-15 NOTE — Care Management Important Message (Signed)
Important Message  Patient Details IM Letter given to the Patient. Name: [name] MRN: [id number] Date of Birth: 03-26-1936   Medicare Important Message Given:  Yes     Kerin Salen 05/15/2021, 11:13 AM

## 2021-05-16 LAB — CULTURE, BLOOD (ROUTINE X 2)
Culture: NO GROWTH
Culture: NO GROWTH

## 2021-05-16 MED ORDER — FUROSEMIDE 10 MG/ML IJ SOLN
20.0000 mg | Freq: Once | INTRAMUSCULAR | Status: AC
  Administered 2021-05-16: 17:00:00 20 mg via INTRAVENOUS
  Filled 2021-05-16: qty 2

## 2021-05-16 MED ORDER — IPRATROPIUM-ALBUTEROL 0.5-2.5 (3) MG/3ML IN SOLN
3.0000 mL | Freq: Four times a day (QID) | RESPIRATORY_TRACT | Status: DC
  Administered 2021-05-16: 21:00:00 3 mL via RESPIRATORY_TRACT
  Filled 2021-05-16: qty 3

## 2021-05-16 MED ORDER — LORATADINE 10 MG PO TABS
10.0000 mg | ORAL_TABLET | Freq: Every day | ORAL | Status: DC
  Administered 2021-05-17 – 2021-05-18 (×2): 10 mg via ORAL
  Filled 2021-05-16 (×2): qty 1

## 2021-05-16 MED ORDER — HYDROXYCHLOROQUINE SULFATE 200 MG PO TABS
200.0000 mg | ORAL_TABLET | Freq: Every day | ORAL | Status: DC
  Administered 2021-05-17 – 2021-05-18 (×2): 200 mg via ORAL
  Filled 2021-05-16 (×2): qty 1

## 2021-05-16 NOTE — Progress Notes (Signed)
[name]  BHA:[id number] DOB: 09/30/1935 DOA: 05/11/2021 PCP: [name], MD    Brief Narrative:  [phone] with a history of COPD requiring 2 L nasal cannula oxygen support, RA, and HLD who presented to the ER with worsening shortness of breath and productive cough progressive over a 2-week timeframe.  A fever up to 102 had been noted at home.  Exam in the ER revealed diffuse wheezing.  There was no apparent infiltrate on CXR but the patient was producing significant discolored sputum.  WBC was elevated at 12.  Consultants:  None  Code Status: FULL CODE  Antimicrobials:  Azithromycin 1/19 > 1/23 Rocephin 1/19 > 1/21 Cefdinir 1/22 > 1/23  DVT prophylaxis: Lovenox  Interim Hx: Afebrile.  Blood pressure mildly elevated at 329 systolic.  Saturation 97% 3 L.  No new labs ordered for today.  Sitting up in a chair but feels quite poorly today.  States she feels short of breath again.  Feels very weak in general.  Poor appetite but no nausea or vomiting.  No chest pain.  Assessment & Plan:  COPD with chronic hypoxic respiratory failure with acute bronchospastic exacerbation Slow to improve but making progress - continue inhaled bronchodilators - slow steroid taper - baseline home oxygen dose is 2-3 L - manage anxiety - +~5L since admit so will give a short course of lasix to see if this helps with her breathing (TTE 2019 w/o evidence of signif CHF)  Situational anxiety versus generalized anxiety disorder It is certainly understandable the patient would be anxious given her persisting dyspnea - carefully titrate medical therapy to attempt to maximize respiratory mechanics while avoiding respiratory suppression -tolerating current intervention well  Community-acquired pneumonia Has completed 5 days of empiric antibiotic therapy  RA on chronic steroid therapy No evidence of acute flare at present  HLD Continue home medical therapy  Deconditioning  Her exertional tolerance is  quite poor and this is likely being compounded by her anxiety  Family Communication: No family present at time of exam today Disposition: From home -plan for discharge home - HHPT suggested but patient politely declined  Objective: Blood pressure (!) 153/78, pulse 83, temperature 97.7 F (36.5 C), temperature source Oral, resp. rate 18, height 5' (1.524 m), weight 77.1 kg, SpO2 97 %.  Intake/Output Summary (Last 24 hours) at 05/16/2021 0905 Last data filed at 05/15/2021 1700 Gross per 24 hour  Intake 660 ml  Output 5 ml  Net 655 ml    Filed Weights   05/11/21 1825  Weight: 77.1 kg    Examination: General: Not in extremis -able to complete full sentences -alert but appears weak Lungs: Breath sounds distant in all fields but no focal crackles or wheezing Cardiovascular: Regular rate and rhythm without murmur  Abdomen: NT/ND, soft, BS positive, no rebound Extremities: Trace edema B LE without change  CBC: Recent Labs  Lab 05/11/21 1902 05/11/21 2240 05/12/21 0110 05/13/21 0528  WBC 12.0* 11.2* 10.2 13.7*  NEUTROABS 10.9*  --   --   --   HGB 13.3 12.5 12.4 12.2  HCT 42.8 39.1 39.5 38.7  MCV 92.2 92.2 92.9 93.5  PLT 424* 371 382 924    Basic Metabolic Panel: Recent Labs  Lab 05/11/21 1902 05/11/21 2240 05/12/21 0110 05/13/21 0528  NA 137  --  135 137  K 3.9  --  3.8 4.4  CL 100  --  98 100  CO2 27  --  27 30  GLUCOSE 145*  --  191* 134*  BUN 14  --  14 18  CREATININE 0.79 0.77 0.91 0.75  CALCIUM 9.0  --  8.3* 8.5*  MG  --   --   --  1.7    GFR: Estimated Creatinine Clearance: 48.9 mL/min (by C-G formula based on SCr of 0.75 mg/dL).   HbA1C: Hgb A1c MFr Bld  Date/Time Value Ref Range Status  05/17/2019 03:40 AM 5.9 (H) 4.8 - 5.6 % Final    Comment:    (NOTE) Pre diabetes:          5.7%-6.4% Diabetes:              >6.4% Glycemic control for   <7.0% adults with diabetes   05/21/2016 05:06 AM 6.3 (H) 4.8 - 5.6 % Final    Comment:    (NOTE)          Pre-diabetes: 5.7 - 6.4         Diabetes: >6.4         Glycemic control for adults with diabetes: <7.0     Blood Culture    Component Value Date/Time   SDES  05/11/2021 1902    RIGHT ANTECUBITAL Performed at [facility], [place] [address] [place], [place] [address]    SDES  05/11/2021 1902    LEFT ANTECUBITAL Performed at [facility], [place] [address], [place], [place] [address]    [place]  05/11/2021 1902    BOTTLES DRAWN AEROBIC AND ANAEROBIC Blood Culture results may not be optimal due to an excessive volume of blood received in culture bottles Performed at [facility], [place] [address], [place], [place] [address]    [place]  05/11/2021 1902    BOTTLES DRAWN AEROBIC AND ANAEROBIC Blood Culture results may not be optimal due to an excessive volume of blood received in culture bottles Performed at [facility], [place] [address], [place], [place] [address]    CULT  05/11/2021 1902    NO GROWTH 5 DAYS Performed at [place] [address] [place], [place] [address]    CULT  05/11/2021 1902    NO GROWTH 5 DAYS Performed at [place] [address], [place], [place] [address]    REPTSTATUS 05/16/2021 FINAL 05/11/2021 1902   REPTSTATUS 05/16/2021 FINAL 05/11/2021 1902      Scheduled Meds:  budesonide (PULMICORT) nebulizer solution  0.25 mg Nebulization BID   calcium-vitamin D  1 tablet Oral Daily   enoxaparin (LOVENOX) injection  40 mg Subcutaneous Q24H   ipratropium-albuterol  3 mL Nebulization BID   mouth rinse  15 mL Mouth Rinse BID   pantoprazole  40 mg Oral Daily   predniSONE  60 mg Oral Q breakfast     LOS: 4 days   Cherene Altes, MD [facility] Office  [phone] Pager - Text Page per Shea Evans  If 7PM-7AM, please contact night-coverage per Amion 05/16/2021, 9:05 AM

## 2021-05-17 LAB — COMPREHENSIVE METABOLIC PANEL
ALT: 22 U/L (ref 0–44)
AST: 23 U/L (ref 15–41)
Albumin: 3.2 g/dL — ABNORMAL LOW (ref 3.5–5.0)
Alkaline Phosphatase: 49 U/L (ref 38–126)
Anion gap: 9 (ref 5–15)
BUN: 25 mg/dL — ABNORMAL HIGH (ref 8–23)
CO2: 31 mmol/L (ref 22–32)
Calcium: 8.5 mg/dL — ABNORMAL LOW (ref 8.9–10.3)
Chloride: 93 mmol/L — ABNORMAL LOW (ref 98–111)
Creatinine, Ser: 0.8 mg/dL (ref 0.44–1.00)
GFR, Estimated: >60 mL/min (ref >60)
Glucose, Bld: 196 mg/dL — ABNORMAL HIGH (ref 70–99)
Potassium: 4.6 mmol/L (ref 3.5–5.1)
Sodium: 133 mmol/L — ABNORMAL LOW (ref 135–145)
Total Bilirubin: 0.7 mg/dL (ref 0.3–1.2)
Total Protein: 6.5 g/dL (ref 6.5–8.1)

## 2021-05-17 LAB — CBC
HCT: 41.6 % (ref 36.0–46.0)
Hemoglobin: 13.1 g/dL (ref 12.0–15.0)
MCH: 29.5 pg (ref 26.0–34.0)
MCHC: 31.5 g/dL (ref 30.0–36.0)
MCV: 93.7 fL (ref 80.0–100.0)
Platelets: 355 10*3/uL (ref 150–400)
RBC: 4.44 MIL/uL (ref 3.87–5.11)
RDW: 13.4 % (ref 11.5–15.5)
WBC: 15.7 10*3/uL — ABNORMAL HIGH (ref 4.0–10.5)
nRBC: 0 % (ref 0.0–0.2)

## 2021-05-17 MED ORDER — ACETAMINOPHEN 325 MG PO TABS
650.0000 mg | ORAL_TABLET | Freq: Four times a day (QID) | ORAL | Status: DC | PRN
  Administered 2021-05-17 (×2): 650 mg via ORAL
  Filled 2021-05-17 (×2): qty 2

## 2021-05-17 MED ORDER — IPRATROPIUM-ALBUTEROL 0.5-2.5 (3) MG/3ML IN SOLN
3.0000 mL | Freq: Three times a day (TID) | RESPIRATORY_TRACT | Status: DC
  Administered 2021-05-17 – 2021-05-18 (×4): 3 mL via RESPIRATORY_TRACT
  Filled 2021-05-17 (×4): qty 3

## 2021-05-17 MED ORDER — SALINE SPRAY 0.65 % NA SOLN
1.0000 | NASAL | Status: DC | PRN
  Administered 2021-05-17: 14:00:00 1 via NASAL
  Filled 2021-05-17: qty 44

## 2021-05-17 NOTE — Progress Notes (Addendum)
Physical Therapy Treatment Patient Details Name: AVERYANA PILLARS MRN: [id number] DOB: [birth year] Today's Date: 05/17/2021   History of Present Illness 86yo with a history of COPD requiring 2 L nasal cannula oxygen support, RA, and HLD who presented to the ER with worsening shortness of breath and productive cough progressive over a 2-week timeframe.  A fever up to 102 had been noted at home. Dx of COPD with chronic hypoxic respiratory failure with acute bronchospastic exacerbation.    PT Comments    Pt having a coughing spell at start of session. Once that eased off, pt took a short walk in the hallway. No LOB with RW use. O2 93% on 3L.    Recommendations for follow up therapy are one component of a multi-disciplinary discharge planning process, led by the attending physician.  Recommendations may be updated based on patient status, additional functional criteria and insurance authorization.  Follow Up Recommendations  No PT follow up (pt declines PT f/u)     Assistance Recommended at Discharge    Patient can return home with the following Assist for transportation;A little help with bathing/dressing/bathroom   Equipment Recommendations  None recommended by PT    Recommendations for Other Services       Precautions / Restrictions Precautions Precautions: Fall Precaution Comments: "I fall all the time." Also, pt refuses to wear a mask. RN stated it was okay for her to walk in hallway without one.  Restrictions Weight Bearing Restrictions: No     Mobility  Bed Mobility               General bed mobility comments: oob in recliner    Transfers Overall transfer level: Needs assistance Equipment used: Rolling walker (2 wheels) Transfers: Sit to/from Stand Sit to Stand: Supervision           General transfer comment: Supv for safety.    Ambulation/Gait Ambulation/Gait assistance: Supervision Gait Distance (Feet): 75 Feet Assistive device: Rolling walker (2  wheels) Gait Pattern/deviations: Step-through pattern, Decreased stride length       General Gait Details: Slow but steady with RW. O2 93% on 3L   Stairs             Wheelchair Mobility    Modified Rankin (Stroke Patients Only)       Balance Overall balance assessment: History of Falls, Needs assistance         Standing balance support: Single extremity supported, Bilateral upper extremity supported, During functional activity Standing balance-Leahy Scale: Fair                              Cognition Arousal/Alertness: Awake/alert Behavior During Therapy: WFL for tasks assessed/performed Overall Cognitive Status: Within Functional Limits for tasks assessed                                          Exercises      General Comments       Pertinent Vitals/Pain Pain Assessment Pain Assessment: Faces Faces Pain Scale: Hurts a little bit Pain Location: chest from coughing Pain Intervention(s): Monitored during session    Home Living                          Prior Function  PT Goals (current goals can now be found in the care plan section) Progress towards PT goals: Progressing toward goals    Frequency    Min 3X/week      PT Plan Current plan remains appropriate    Co-evaluation              AM-PAC PT "6 Clicks" Mobility   Outcome Measure  Help needed turning from your back to your side while in a flat bed without using bedrails?: None Help needed moving from lying on your back to sitting on the side of a flat bed without using bedrails?: None Help needed moving to and from a bed to a chair (including a wheelchair)?: None Help needed standing up from a chair using your arms (e.g., wheelchair or bedside chair)?: None Help needed to walk in hospital room?: A Little Help needed climbing 3-5 steps with a railing? : A Little 6 Click Score: 22    End of Session   Activity Tolerance:  Patient tolerated treatment well Patient left: in chair;with call bell/phone within reach;with family/visitor present   PT Visit Diagnosis: Difficulty in walking, not elsewhere classified (R26.2)     Time: 1100-1133 PT Time Calculation (min) (ACUTE ONLY): 33 min  Charges:  $Gait Training: 8-22 mins                        Doreatha Massed, PT Acute Rehabilitation  Office: [phone] Pager: [phone]

## 2021-05-17 NOTE — TOC Progression Note (Signed)
Transition of Care [facility]) - Progression Note    Patient Details  Name: [name] MRN: [id number] Date of Birth: [birth year]  [facility]) CM/SW Contact  Leeroy Cha, RN Phone Number: 05/17/2021, 8:07 AM  Clinical Narrative:    Following for toc needs      Barriers to Discharge: No Barriers Identified  Expected Discharge Plan and Services                                                 Social Determinants of Health (SDOH) Interventions    Readmission Risk Interventions No flowsheet data found.

## 2021-05-17 NOTE — Progress Notes (Signed)
Occupational Therapy Treatment Patient Details Name: [name] MRN: [id number] DOB: [birth year] Today's Date: 05/17/2021   History of present illness 86yo with a history of COPD requiring 2 L nasal cannula oxygen support, RA, and HLD who presented to the ER with worsening shortness of breath and productive cough progressive over a 2-week timeframe.  A fever up to 102 had been noted at home. Dx of COPD with chronic hypoxic respiratory failure with acute bronchospastic exacerbation.   OT comments  Patient min G assist with x1 minor loss of balance standing sink side for grooming/hygiene tasks. Patient min G to ambulate in her room with rolling walker, able to navigate obstacles safely. Patient on 2-3L O2 at baseline.   Recommendations for follow up therapy are one component of a multi-disciplinary discharge planning process, led by the attending physician.  Recommendations may be updated based on patient status, additional functional criteria and insurance authorization.    Follow Up Recommendations  No OT follow up    Assistance Recommended at Discharge Intermittent Supervision/Assistance  Patient can return home with the following  A little help with walking and/or transfers;Assistance with cooking/housework;Help with stairs or ramp for entrance;A little help with bathing/dressing/bathroom   Equipment Recommendations  None recommended by OT       Precautions / Restrictions Precautions Precautions: Fall Precaution Comments: "I fall all the time." Restrictions Weight Bearing Restrictions: No       Mobility Bed Mobility Overal bed mobility: Modified Independent                     Balance Overall balance assessment: History of Falls, Needs assistance Sitting-balance support: Feet supported Sitting balance-Leahy Scale: Normal     Standing balance support: Single extremity supported, Bilateral upper extremity supported, During functional activity Standing  balance-Leahy Scale: Fair Standing balance comment: static stand without UE support                           ADL either performed or assessed with clinical judgement   ADL Overall ADL's : Needs assistance/impaired     Grooming: Wash/dry hands;Wash/dry face;Oral care;Min guard;Standing Grooming Details (indicate cue type and reason): x1 minor posterior loss of balance at sink, able to self correct holding onto sink and min G for safety                 Toilet Transfer: Min guard;Ambulation;Rolling walker (2 wheels) Toilet Transfer Details (indicate cue type and reason): Simulated with functional ambulation in room then transfer to recliner chair. Patient overall min G for safety, able to navigate obstacles in her room with walker.         Functional mobility during ADLs: Min guard;Rolling walker (2 wheels)        Cognition Arousal/Alertness: Awake/alert Behavior During Therapy: WFL for tasks assessed/performed Overall Cognitive Status: Within Functional Limits for tasks assessed                                 General Comments: Patient becomes upset when told she has to wear a mask in the hallway, states others have let her ambulate without one.              General Comments Attempted to take vitals while on RA standing sink side however unable to get vitals machine to work. No obvious signs of increased work of breathing and patient deny SOB  Pertinent Vitals/ Pain       Pain Assessment Pain Assessment: No/denies pain         Frequency  Min 2X/week        Progress Toward Goals  OT Goals(current goals can now be found in the care plan section)  Progress towards OT goals: Progressing toward goals  Acute Rehab OT Goals Patient Stated Goal: Feel better OT Goal Formulation: With patient Time For Goal Achievement: 05/29/21 Potential to Achieve Goals: Good ADL Goals Pt Will Perform Grooming: with modified  independence;standing;sitting Pt Will Perform Lower Body Dressing: with modified independence;sitting/lateral leans;sit to/from stand Pt Will Transfer to Toilet: with modified independence Pt Will Perform Toileting - Clothing Manipulation and hygiene: with modified independence  Plan Discharge plan remains appropriate       AM-PAC OT "6 Clicks" Daily Activity     Outcome Measure   Help from another person eating meals?: None Help from another person taking care of personal grooming?: A Little Help from another person toileting, which includes using toliet, bedpan, or urinal?: A Little Help from another person bathing (including washing, rinsing, drying)?: A Little Help from another person to put on and taking off regular upper body clothing?: None Help from another person to put on and taking off regular lower body clothing?: A Little 6 Click Score: 20    End of Session Equipment Utilized During Treatment: Rolling walker (2 wheels)  OT Visit Diagnosis: Unsteadiness on feet (R26.81);History of falling (Z91.81)   Activity Tolerance Patient tolerated treatment well   Patient Left in chair;with call bell/phone within reach   Nurse Communication Mobility status        Time: 9833-8250 OT Time Calculation (min): 23 min  Charges: OT General Charges $OT Visit: 1 Visit OT Treatments $Self Care/Home Management : 23-37 mins  Delbert Phenix OT OT pager: [phone]   Rosemary Holms 05/17/2021, 10:29 AM

## 2021-05-17 NOTE — Progress Notes (Signed)
PROGRESS NOTE    [name]  VVZ:[id number] DOB: [birth year] DOA: 05/11/2021 PCP: [name], MD  Brief Narrative: [phone] with a history of COPD requiring 2 L nasal cannula oxygen support, RA, and HLD who presented to the ER with worsening shortness of breath and productive cough progressive over a 2-week timeframe.  A fever up to 102 had been noted at home.  Exam in the ER revealed diffuse wheezing.  There was no apparent infiltrate on CXR but the patient was producing significant discolored sputum.  WBC was elevated at 12.  Assessment & Plan:   Principal Problem:   CAP (community acquired pneumonia) Active Problems:   HLD (hyperlipidemia)   COPD with acute exacerbation ([place])   Rheumatoid arthritis ([place])   #1 acute copd exacerbation secondary to CAP-patient admitted with fever increasing dyspnea and shortness of breath.  Has oxygen at home 24/7 between 2 to 3 L.  She feels very weak today and does not feel she is ready to go home. Continue slow taper of steroid  Continue nebulizer treatments Out of bed ambulate Patient on a short course of Lasix as she is starting to retain fluid. On prednisone 60 mg daily started 05/15/2021 Out of bed [facility] to discharge her 05/18/2021  #2 CAP she finished 5 days course of antibiotics.  #3 severe deconditioning-she does not want to go to rehab as suggested by PT and OT.  #4 anxiety due to dyspnea on Klonopin started in hospital  #5 rheumatoid arthritis on Plaquenil and prednisone  #6 hyperlipidemia on Lipitor prior to admission  #7 depression restart Zoloft she is on 100 mg daily at home    Estimated body mass index is 33.2 kg/m as calculated from the following:   Height as of this encounter: 5' (1.524 m).   Weight as of this encounter: 77.1 kg.  DVT prophylaxis: Lovenox  code Status: Full code Family Communication: None at bedside  disposition Plan:  Status is: Inpatient  Remains inpatient appropriate because: Ongoing  breathing treatments and nebulizers        Consultants:  None  Procedures: None Antimicrobials: None  Subjective: Patient resting in bed appears in mild distress due to anxiety from being short of breath  Objective: Appears chronically ill looking Vitals:   05/16/21 1543 05/16/21 2055 05/17/21 0518 05/17/21 0836  BP:  132/76 (!) 147/87   Pulse: (!) 107 (!) 101 87   Resp:  20 18   Temp: (!) 97.4 F (36.3 C) 98 F (36.7 C) 98 F (36.7 C)   TempSrc: Oral Oral Oral   SpO2: (!) 89% 96% 98% 100%  Weight:      Height:        Intake/Output Summary (Last 24 hours) at 05/17/2021 1334 Last data filed at 05/16/2021 1900 Gross per 24 hour  Intake --  Output 402 ml  Net -402 ml   Filed Weights   05/11/21 1825  Weight: 77.1 kg    Examination:  General exam: Appears calm and comfortable  Respiratory system: Wheezing to auscultation. Respiratory effort normal. Cardiovascular system: S1 & S2 heard, RRR. No JVD, murmurs, rubs, gallops or clicks. No pedal edema. Gastrointestinal system: Abdomen is nondistended, soft and nontender. No organomegaly or masses felt. Normal bowel sounds heard. Central nervous system: Alert and oriented. No focal neurological deficits. Extremities: Symmetric 5 x 5 power. Skin: No rashes, lesions or ulcers Psychiatry: Judgement and insight appear normal. Mood & affect appropriate.     Data Reviewed: I have personally  reviewed following labs and imaging studies  CBC: Recent Labs  Lab 05/11/21 1902 05/11/21 2240 05/12/21 0110 05/13/21 0528  WBC 12.0* 11.2* 10.2 13.7*  NEUTROABS 10.9*  --   --   --   HGB 13.3 12.5 12.4 12.2  HCT 42.8 39.1 39.5 38.7  MCV 92.2 92.2 92.9 93.5  PLT 424* 371 382 381   Basic Metabolic Panel: Recent Labs  Lab 05/11/21 1902 05/11/21 2240 05/12/21 0110 05/13/21 0528  NA 137  --  135 137  K 3.9  --  3.8 4.4  CL 100  --  98 100  CO2 27  --  27 30  GLUCOSE 145*  --  191* 134*  BUN 14  --  14 18  CREATININE  0.79 0.77 0.91 0.75  CALCIUM 9.0  --  8.3* 8.5*  MG  --   --   --  1.7   GFR: Estimated Creatinine Clearance: 48.9 mL/min (by C-G formula based on SCr of 0.75 mg/dL). Liver Function Tests: No results for input(s): AST, ALT, ALKPHOS, BILITOT, PROT, ALBUMIN in the last 168 hours. No results for input(s): LIPASE, AMYLASE in the last 168 hours. No results for input(s): AMMONIA in the last 168 hours. Coagulation Profile: No results for input(s): INR, PROTIME in the last 168 hours. Cardiac Enzymes: No results for input(s): CKTOTAL, CKMB, CKMBINDEX, TROPONINI in the last 168 hours. BNP (last 3 results) No results for input(s): PROBNP in the last 8760 hours. HbA1C: No results for input(s): HGBA1C in the last 72 hours. CBG: No results for input(s): GLUCAP in the last 168 hours. Lipid Profile: No results for input(s): CHOL, HDL, LDLCALC, TRIG, CHOLHDL, LDLDIRECT in the last 72 hours. Thyroid Function Tests: No results for input(s): TSH, T4TOTAL, FREET4, T3FREE, THYROIDAB in the last 72 hours. Anemia Panel: No results for input(s): VITAMINB12, FOLATE, FERRITIN, TIBC, IRON, RETICCTPCT in the last 72 hours. Sepsis Labs: Recent Labs  Lab 05/12/21 0110 05/12/21 0405 05/12/21 0539 05/13/21 0542  LATICACIDVEN 2.8* 3.7* 4.8* 2.0*    Recent Results (from the past 240 hour(s))  Resp Panel by RT-PCR (Flu A&B, Covid) Nasopharyngeal Swab     Status: None   Collection Time: 05/11/21  6:28 PM   Specimen: Nasopharyngeal Swab; Nasopharyngeal(NP) swabs in vial transport medium  Result Value Ref Range Status   SARS Coronavirus 2 by RT PCR NEGATIVE NEGATIVE Final    Comment: (NOTE) SARS-CoV-2 target nucleic acids are NOT DETECTED.  The SARS-CoV-2 RNA is generally detectable in upper respiratory specimens during the acute phase of infection. The lowest concentration of SARS-CoV-2 viral copies this assay can detect is 138 copies/mL. A negative result does not preclude SARS-Cov-2 infection and should  not be used as the sole basis for treatment or other patient management decisions. A negative result may occur with  improper specimen collection/handling, submission of specimen other than nasopharyngeal swab, presence of viral mutation(s) within the areas targeted by this assay, and inadequate number of viral copies(<138 copies/mL). A negative result must be combined with clinical observations, patient history, and epidemiological information. The expected result is Negative.  Fact Sheet for Patients:  EntrepreneurPulse.com.au  Fact Sheet for Healthcare Providers:  IncredibleEmployment.be  This test is no t yet approved or cleared by the [place] FDA and  has been authorized for detection and/or diagnosis of SARS-CoV-2 by FDA under an Emergency Use Authorization (EUA). This EUA will remain  in effect (meaning this test can be used) for the duration of the COVID-19 declaration under Section  564(b)(1) of the Act, 21 U.S.C.section 360bbb-3(b)(1), unless the authorization is terminated  or revoked sooner.       Influenza A by PCR NEGATIVE NEGATIVE Final   Influenza B by PCR NEGATIVE NEGATIVE Final    Comment: (NOTE) The Xpert Xpress SARS-CoV-2/FLU/RSV plus assay is intended as an aid in the diagnosis of influenza from Nasopharyngeal swab specimens and should not be used as a sole basis for treatment. Nasal washings and aspirates are unacceptable for Xpert Xpress SARS-CoV-2/FLU/RSV testing.  Fact Sheet for Patients: EntrepreneurPulse.com.au  Fact Sheet for Healthcare Providers: IncredibleEmployment.be  This test is not yet approved or cleared by the [place] FDA and has been authorized for detection and/or diagnosis of SARS-CoV-2 by FDA under an Emergency Use Authorization (EUA). This EUA will remain in effect (meaning this test can be used) for the duration of the COVID-19 declaration under  Section 564(b)(1) of the Act, 21 U.S.C. section 360bbb-3(b)(1), unless the authorization is terminated or revoked.  Performed at [facility], [place] [address] [place], [place] [address]   Culture, blood (routine x 2)     Status: None   Collection Time: 05/11/21  7:02 PM   Specimen: Right Antecubital; Blood  Result Value Ref Range Status   Specimen Description   Final    RIGHT ANTECUBITAL Performed at [facility] [address], [place], [place] [address]    Special Requests   Final    BOTTLES DRAWN AEROBIC AND ANAEROBIC Blood Culture results may not be optimal due to an excessive volume of blood received in culture bottles Performed at [facility] [address] [place], [place] [address]    Culture   Final    NO GROWTH 5 DAYS Performed at [facility], [place] [address], [place], [place] [address]    Report Status 05/16/2021 FINAL  Final  Culture, blood (routine x 2)     Status: None   Collection Time: 05/11/21  7:02 PM   Specimen: Left Antecubital; Blood  Result Value Ref Range Status   Specimen Description   Final    LEFT ANTECUBITAL Performed at [facility] [address], [place], [place] [address]    Special Requests   Final    BOTTLES DRAWN AEROBIC AND ANAEROBIC Blood Culture results may not be optimal due to an excessive volume of blood received in culture bottles Performed at [facility] [address] [place], [place] [address]    Culture   Final    NO GROWTH 5 DAYS Performed at [facility], [place] [address], [place], [place] [address]    Report Status 05/16/2021 FINAL  Final         Radiology Studies: No results found.      Scheduled Meds:  budesonide (PULMICORT) nebulizer solution  0.25 mg Nebulization BID   calcium-vitamin D  1 tablet Oral Daily   enoxaparin (LOVENOX) injection  40 mg Subcutaneous Q24H   hydroxychloroquine  200 mg Oral  Daily   ipratropium-albuterol  3 mL Nebulization TID   loratadine  10 mg Oral Daily   mouth rinse  15 mL Mouth Rinse BID   pantoprazole  40 mg Oral Daily   predniSONE  60 mg Oral Q breakfast   Continuous Infusions:   LOS: 5 days   Georgette Shell, MD 05/17/2021, 1:34 PM

## 2021-05-18 MED ORDER — IPRATROPIUM-ALBUTEROL 0.5-2.5 (3) MG/3ML IN SOLN: 3.0000 mL | Freq: Two times a day (BID) | RESPIRATORY_TRACT | Status: DC

## 2021-05-18 MED ORDER — FLUTICASONE-UMECLIDIN-VILANT 100-62.5-25 MCG/ACT IN AEPB: 1.0000 | INHALATION_SPRAY | Freq: Every day | RESPIRATORY_TRACT | 2 refills | Status: AC

## 2021-05-18 MED ORDER — ALBUTEROL SULFATE (2.5 MG/3ML) 0.083% IN NEBU: 2.5000 mg | INHALATION_SOLUTION | RESPIRATORY_TRACT | 4 refills | Status: AC | PRN

## 2021-05-18 NOTE — TOC Transition Note (Signed)
Transition of Care [facility]) - CM/SW Discharge Note   Patient Details  Name: [name] MRN: [id number] Date of Birth: 1936/03/30  Transition of [facility]) CM/SW Contact:  Trish Mage, LCSW Phone Number: 05/18/2021, 10:12 AM   Clinical Narrative:   Patient who is stable for d/c is in need of O2 travel cannister.  Ms Elwell states her O2 company is [employer], and that her pastor will give her a ride home.  Spoke with Jeneen Rinks with [employer] who will arrange for delivery of travel cannister to patient's room by 1PM at latest. No further needs identified. TOC sign off.    Final next level of care: Home/Self Care Barriers to Discharge: No Barriers Identified   Patient Goals and CMS Choice        Discharge Placement                       Discharge Plan and Services                                     Social Determinants of Health (SDOH) Interventions     Readmission Risk Interventions No flowsheet data found.

## 2021-05-18 NOTE — Discharge Summary (Signed)
Physician Discharge Summary  [name] JXB:[id number] DOB: Dec 30, 1935 DOA: 05/11/2021  PCP: [name], MD  Admit date: 05/11/2021 Discharge date: 05/18/2021  Admitted From: Home Disposition: Home  Recommendations for Outpatient Follow-up:  Follow up with PCP in 1-2 weeks Please obtain BMP/CBC in one week  Home Health:yes Equipment/Devices:none Discharge Condition:stable CODE STATUS:full Diet recommendation: cardiac  Brief/Interim Summary:85yo with a history of COPD requiring 2 L nasal cannula oxygen support, RA, and HLD who presented to the ER with worsening shortness of breath and productive cough progressive over a 2-week timeframe.  A fever up to 102 had been noted at home.  Exam in the ER revealed diffuse wheezing.  There was no apparent infiltrate on CXR but the patient was producing significant discolored sputum.  WBC was elevated at 12.   Discharge Diagnoses:  Principal Problem:   CAP (community acquired pneumonia) Active Problems:   HLD (hyperlipidemia)   COPD with acute exacerbation ([place])   Rheumatoid arthritis ([place])       #1 acute copd exacerbation secondary to CAP-patient admitted with fever increasing dyspnea and shortness of breath.  Has oxygen at home 24/7 between 2 to 3 L.  Continue  prednisone as prior to admission.  PT outpatient on discharge.  Home health on discharge.   #2 CAP she finished 5 days course of antibiotics.   #3 severe deconditioning-she does not want to go to rehab as suggested by PT and OT.   #4 anxiety due to dyspnea on Klonopin started in hospital   #5 rheumatoid arthritis on Plaquenil and prednisone   #6 hyperlipidemia on Lipitor prior to admission   #7 depression restart Zoloft she is on 100 mg daily at home  Estimated body mass index is 33.2 kg/m as calculated from the following:   Height as of this encounter: 5' (1.524 m).   Weight as of this encounter: 77.1 kg.  Discharge Instructions  Discharge Instructions      Diet - low sodium heart healthy   Complete by: As directed    Increase activity slowly   Complete by: As directed       Allergies as of 05/18/2021       Reactions   Kapidex [dexlansoprazole] Other (See Comments)   Stomach pain, diarrhea   Zegerid [omeprazole] Other (See Comments)   Burning in chest        Medication List     TAKE these medications    acetaminophen 650 MG CR tablet Commonly known as: TYLENOL Take 1,300 mg by mouth every 8 (eight) hours as needed for pain.   albuterol (2.5 MG/3ML) 0.083% nebulizer solution Commonly known as: PROVENTIL Take 3 mLs (2.5 mg total) by nebulization every 4 (four) hours as needed for wheezing or shortness of breath. For shortness of breath.   aspirin EC 81 MG tablet Take 81 mg by mouth daily.   atorvastatin 40 MG tablet Commonly known as: LIPITOR Take 40 mg by mouth every morning.   azelastine 0.1 % nasal spray Commonly known as: ASTELIN Place 1 spray into the nose 2 (two) times daily as needed for allergies.   Calcium-Vitamin D 500-3.125 MG-MCG Tabs Take 1 tablet by mouth daily.   carboxymethylcellulose 0.5 % Soln Commonly known as: REFRESH PLUS 2 drops 2 (two) times daily as needed (dry eye).   cetirizine 10 MG tablet Commonly known as: ZYRTEC Take 1 tablet (10 mg total) by mouth every morning.   esomeprazole 20 MG capsule Commonly known as: NEXIUM Take 20 mg by  mouth daily at 12 noon.   Fluticasone-Umeclidin-Vilant 100-62.5-25 MCG/ACT Aepb Commonly known as: Trelegy Ellipta Take 1 puff by mouth daily. What changed: medication strength   folic acid 1 MG tablet Commonly known as: FOLVITE Take 1 tablet (1 mg total) by mouth daily.   gabapentin 100 MG capsule Commonly known as: NEURONTIN Take 2-3 capsules (200-300 mg total) by mouth 3 (three) times daily. 200 mg every AM and at noon and 300 mg QHS   hydroxychloroquine 200 MG tablet Commonly known as: PLAQUENIL Take 200 mg by mouth daily.   polyethylene  glycol 17 g packet Commonly known as: MIRALAX / GLYCOLAX Take 17 g by mouth daily. What changed:  when to take this reasons to take this   predniSONE 5 MG tablet Commonly known as: DELTASONE Take 5 mg by mouth daily with breakfast.   sertraline 100 MG tablet Commonly known as: ZOLOFT Take 100 mg by mouth daily.   Systane 0.4-0.3 % Soln Generic drug: Polyethyl Glycol-Propyl Glycol Apply 1 drop to eye daily as needed (dry eyes).   tiotropium 18 MCG inhalation capsule Commonly known as: SPIRIVA Place 1 capsule (18 mcg total) into inhaler and inhale every morning.   Vitamin D 50 MCG (2000 UT) tablet Take 4,000 Units by mouth daily.        Follow-up Information     [name], MD Follow up.   Specialty: Family Medicine Contact information: [place] [address] [phone]         Jerline Pain, MD .   Specialty: Cardiology Contact information: [phone] [place] [address] [phone]         [facility] Follow up.   Why: Your oxygen company will deliver a travel cannister to the room today Contact information: [address] [place] [address] [phone]                Allergies  Allergen Reactions   Kapidex [Dexlansoprazole] Other (See Comments)    Stomach pain, diarrhea   Zegerid [Omeprazole] Other (See Comments)    Burning in chest    Consultations: none   Procedures/Studies: DG Chest 2 View  Result Date: 05/11/2021 CLINICAL DATA:  Increasing shortness of breath for 2 weeks, productive cough, febrile EXAM: CHEST - 2 VIEW COMPARISON:  10/18/2020 FINDINGS: Frontal and lateral views of the chest demonstrate an unremarkable cardiac silhouette. Stable hyperinflation and background emphysema. No airspace disease, effusion, or pneumothorax. No acute bony abnormality. IMPRESSION: 1. Stable exam, no acute process. Electronically Signed   By: Randa Ngo M.D.   On:  05/11/2021 18:50   (Echo, Carotid, EGD, Colonoscopy, ERCP)    Subjective:  She is resting in bed in no acute distress she sat up yesterday her breathing is better she lives with family at home Discharge Exam: Vitals:   05/18/21 0543 05/18/21 0842  BP: (!) 162/84 (!) 147/69  Pulse: 87 (!) 56  Resp: 20 15  Temp: 97.8 F (36.6 C) 97.6 F (36.4 C)  SpO2: 96% 97%   Vitals:   05/17/21 1825 05/17/21 1945 05/18/21 0543 05/18/21 0842  BP:  (!) 144/63 (!) 162/84 (!) 147/69  Pulse:  (!) 101 87 (!) 56  Resp:  20 20 15   Temp:  98.1 F (36.7 C) 97.8 F (36.6 C) 97.6 F (36.4 C)  TempSrc:  Oral Oral Oral  SpO2: 94% 96% 96% 97%  Weight:      Height:  General: Pt is alert, awake, not in acute distress Cardiovascular: RRR, S1/S2 +, no rubs, no gallops Respiratory: few rhonchi bilaterally, no wheezing, no rhonchi Abdominal: Soft, NT, ND, bowel sounds + Extremities: no edema, no cyanosis    The results of significant diagnostics from this hospitalization (including imaging, microbiology, ancillary and laboratory) are listed below for reference.     Microbiology: Recent Results (from the past 240 hour(s))  Resp Panel by RT-PCR (Flu A&B, Covid) Nasopharyngeal Swab     Status: None   Collection Time: 05/11/21  6:28 PM   Specimen: Nasopharyngeal Swab; Nasopharyngeal(NP) swabs in vial transport medium  Result Value Ref Range Status   SARS Coronavirus 2 by RT PCR NEGATIVE NEGATIVE Final    Comment: (NOTE) SARS-CoV-2 target nucleic acids are NOT DETECTED.  The SARS-CoV-2 RNA is generally detectable in upper respiratory specimens during the acute phase of infection. The lowest concentration of SARS-CoV-2 viral copies this assay can detect is 138 copies/mL. A negative result does not preclude SARS-Cov-2 infection and should not be used as the sole basis for treatment or other patient management decisions. A negative result may occur with  improper specimen collection/handling,  submission of specimen other than nasopharyngeal swab, presence of viral mutation(s) within the areas targeted by this assay, and inadequate number of viral copies(<138 copies/mL). A negative result must be combined with clinical observations, patient history, and epidemiological information. The expected result is Negative.  Fact Sheet for Patients:  EntrepreneurPulse.com.au  Fact Sheet for Healthcare Providers:  IncredibleEmployment.be  This test is no t yet approved or cleared by the [place] FDA and  has been authorized for detection and/or diagnosis of SARS-CoV-2 by FDA under an Emergency Use Authorization (EUA). This EUA will remain  in effect (meaning this test can be used) for the duration of the COVID-19 declaration under Section 564(b)(1) of the Act, 21 U.S.C.section 360bbb-3(b)(1), unless the authorization is terminated  or revoked sooner.       Influenza A by PCR NEGATIVE NEGATIVE Final   Influenza B by PCR NEGATIVE NEGATIVE Final    Comment: (NOTE) The Xpert Xpress SARS-CoV-2/FLU/RSV plus assay is intended as an aid in the diagnosis of influenza from Nasopharyngeal swab specimens and should not be used as a sole basis for treatment. Nasal washings and aspirates are unacceptable for Xpert Xpress SARS-CoV-2/FLU/RSV testing.  Fact Sheet for Patients: EntrepreneurPulse.com.au  Fact Sheet for Healthcare Providers: IncredibleEmployment.be  This test is not yet approved or cleared by the [place] FDA and has been authorized for detection and/or diagnosis of SARS-CoV-2 by FDA under an Emergency Use Authorization (EUA). This EUA will remain in effect (meaning this test can be used) for the duration of the COVID-19 declaration under Section 564(b)(1) of the Act, 21 U.S.C. section 360bbb-3(b)(1), unless the authorization is terminated or revoked.  Performed at [facility], [place] [address] [place], [place] [address]   Culture, blood (routine x 2)     Status: None   Collection Time: 05/11/21  7:02 PM   Specimen: Right Antecubital; Blood  Result Value Ref Range Status   Specimen Description   Final    RIGHT ANTECUBITAL Performed at [facility] [address] [place], [place] [address]    Special Requests   Final    BOTTLES DRAWN AEROBIC AND ANAEROBIC Blood Culture results may not be optimal due to an excessive volume of blood received in culture bottles Performed at [facility] [address] [place],  [place] [address]    Culture   Final    NO GROWTH 5 DAYS Performed at [facility], [place] [address] [place], [place] [address]    Report Status 05/16/2021 FINAL  Final  Culture, blood (routine x 2)     Status: None   Collection Time: 05/11/21  7:02 PM   Specimen: Left Antecubital; Blood  Result Value Ref Range Status   Specimen Description   Final    LEFT ANTECUBITAL Performed at [facility] [address], [place], [place] [address]    Special Requests   Final    BOTTLES DRAWN AEROBIC AND ANAEROBIC Blood Culture results may not be optimal due to an excessive volume of blood received in culture bottles Performed at [facility] [address] [place], [place] [address]    Culture   Final    NO GROWTH 5 DAYS Performed at [facility], [place] [address] [place],  [address]    Report Status 05/16/2021 FINAL  Final     Labs: BNP (last 3 results) Recent Labs    05/11/21 1902  BNP 893.7*   Basic Metabolic Panel: Recent Labs  Lab 05/11/21 1902 05/11/21 2240 05/12/21 0110 05/13/21 0528 05/17/21 1414  NA 137  --  135 137 133*  K 3.9  --  3.8 4.4 4.6  CL 100  --  98 100 93*  CO2 27  --  27 30 31   GLUCOSE 145*  --  191* 134* 196*  BUN 14  --  14 18 25*  CREATININE 0.79 0.77 0.91 0.75 0.80  CALCIUM 9.0  --  8.3* 8.5* 8.5*  MG  --    --   --  1.7  --    Liver Function Tests: Recent Labs  Lab 05/17/21 1414  AST 23  ALT 22  ALKPHOS 49  BILITOT 0.7  PROT 6.5  ALBUMIN 3.2*   No results for input(s): LIPASE, AMYLASE in the last 168 hours. No results for input(s): AMMONIA in the last 168 hours. CBC: Recent Labs  Lab 05/11/21 1902 05/11/21 2240 05/12/21 0110 05/13/21 0528 05/17/21 1414  WBC 12.0* 11.2* 10.2 13.7* 15.7*  NEUTROABS 10.9*  --   --   --   --   HGB 13.3 12.5 12.4 12.2 13.1  HCT 42.8 39.1 39.5 38.7 41.6  MCV 92.2 92.2 92.9 93.5 93.7  PLT 424* 371 382 390 355   Cardiac Enzymes: No results for input(s): CKTOTAL, CKMB, CKMBINDEX, TROPONINI in the last 168 hours. BNP: Invalid input(s): POCBNP CBG: No results for input(s): GLUCAP in the last 168 hours. D-Dimer No results for input(s): DDIMER in the last 72 hours. Hgb A1c No results for input(s): HGBA1C in the last 72 hours. Lipid Profile No results for input(s): CHOL, HDL, LDLCALC, TRIG, CHOLHDL, LDLDIRECT in the last 72 hours. Thyroid function studies No results for input(s): TSH, T4TOTAL, T3FREE, THYROIDAB in the last 72 hours.  Invalid input(s): FREET3 Anemia work up No results for input(s): VITAMINB12, FOLATE, FERRITIN, TIBC, IRON, RETICCTPCT in the last 72 hours. Urinalysis    Component Value Date/Time   COLORURINE YELLOW 07/16/2019 1701   APPEARANCEUR HAZY (A) 07/16/2019 1701   LABSPEC 1.013 07/16/2019 1701   PHURINE 5.0 07/16/2019 1701   GLUCOSEU NEGATIVE 07/16/2019 1701   HGBUR NEGATIVE 07/16/2019 1701   BILIRUBINUR NEGATIVE 07/16/2019 1701   BILIRUBINUR neg 08/17/2013 1446   KETONESUR NEGATIVE 07/16/2019 1701   PROTEINUR NEGATIVE 07/16/2019 1701   UROBILINOGEN  0.2 08/17/2013 1446   UROBILINOGEN 0.2 07/06/2011 1519   NITRITE NEGATIVE 07/16/2019 1701   LEUKOCYTESUR LARGE (A) 07/16/2019 1701   Sepsis Labs Invalid input(s): PROCALCITONIN,  WBC,  LACTICIDVEN Microbiology Recent Results (from the past 240 hour(s))  Resp  Panel by RT-PCR (Flu A&B, Covid) Nasopharyngeal Swab     Status: None   Collection Time: 05/11/21  6:28 PM   Specimen: Nasopharyngeal Swab; Nasopharyngeal(NP) swabs in vial transport medium  Result Value Ref Range Status   SARS Coronavirus 2 by RT PCR NEGATIVE NEGATIVE Final    Comment: (NOTE) SARS-CoV-2 target nucleic acids are NOT DETECTED.  The SARS-CoV-2 RNA is generally detectable in upper respiratory specimens during the acute phase of infection. The lowest concentration of SARS-CoV-2 viral copies this assay can detect is 138 copies/mL. A negative result does not preclude SARS-Cov-2 infection and should not be used as the sole basis for treatment or other patient management decisions. A negative result may occur with  improper specimen collection/handling, submission of specimen other than nasopharyngeal swab, presence of viral mutation(s) within the areas targeted by this assay, and inadequate number of viral copies(<138 copies/mL). A negative result must be combined with clinical observations, patient history, and epidemiological information. The expected result is Negative.  Fact Sheet for Patients:  EntrepreneurPulse.com.au  Fact Sheet for Healthcare Providers:  IncredibleEmployment.be  This test is no t yet approved or cleared by the [place] FDA and  has been authorized for detection and/or diagnosis of SARS-CoV-2 by FDA under an Emergency Use Authorization (EUA). This EUA will remain  in effect (meaning this test can be used) for the duration of the COVID-19 declaration under Section 564(b)(1) of the Act, 21 U.S.C.section 360bbb-3(b)(1), unless the authorization is terminated  or revoked sooner.       Influenza A by PCR NEGATIVE NEGATIVE Final   Influenza B by PCR NEGATIVE NEGATIVE Final    Comment: (NOTE) The Xpert Xpress SARS-CoV-2/FLU/RSV plus assay is intended as an aid in the diagnosis of influenza from Nasopharyngeal  swab specimens and should not be used as a sole basis for treatment. Nasal washings and aspirates are unacceptable for Xpert Xpress SARS-CoV-2/FLU/RSV testing.  Fact Sheet for Patients: EntrepreneurPulse.com.au  Fact Sheet for Healthcare Providers: IncredibleEmployment.be  This test is not yet approved or cleared by the [place] FDA and has been authorized for detection and/or diagnosis of SARS-CoV-2 by FDA under an Emergency Use Authorization (EUA). This EUA will remain in effect (meaning this test can be used) for the duration of the COVID-19 declaration under Section 564(b)(1) of the Act, 21 U.S.C. section 360bbb-3(b)(1), unless the authorization is terminated or revoked.  Performed at [facility], [place] [address], [place], [place] [address]   Culture, blood (routine x 2)     Status: None   Collection Time: 05/11/21  7:02 PM   Specimen: Right Antecubital; Blood  Result Value Ref Range Status   Specimen Description   Final    RIGHT ANTECUBITAL Performed at [facility] [address], [place], [place] [address]    Special Requests   Final    BOTTLES DRAWN AEROBIC AND ANAEROBIC Blood Culture results may not be optimal due to an excessive volume of blood received in culture bottles Performed at [facility] [address] [place], [place] [address]    Culture   Final    NO GROWTH 5 DAYS Performed at [facility], [place] [address], [place], [place] [address]  Report Status 05/16/2021 FINAL  Final  Culture, blood (routine x 2)     Status: None   Collection Time: 05/11/21  7:02 PM   Specimen: Left Antecubital; Blood  Result Value Ref Range Status   Specimen Description   Final    LEFT ANTECUBITAL Performed at [facility] [address], [place], [place] [address]    Special Requests   Final    BOTTLES DRAWN AEROBIC AND ANAEROBIC Blood Culture  results may not be optimal due to an excessive volume of blood received in culture bottles Performed at [facility] [address], [place], [place] [address]    Culture   Final    NO GROWTH 5 DAYS Performed at [facility], [place] [address], [place], [place] [address]    Report Status 05/16/2021 FINAL  Final     Time coordinating discharge: 39 minutes  SIGNED  Georgette Shell, MD  [facility] 05/18/2021, 2:05 PM

## 2021-05-18 NOTE — Progress Notes (Signed)
Pt discharged home. Home O2 delivered at bedside. PIV removed. VSS. AVS printed and educational teaching completed with teach back method. Pt has all belongings. No further questions at this time.

## 2021-05-22 DIAGNOSIS — J9811 Atelectasis: Secondary | ICD-10-CM | POA: Diagnosis not present

## 2021-05-22 DIAGNOSIS — R5381 Other malaise: Secondary | ICD-10-CM | POA: Diagnosis not present

## 2021-05-22 DIAGNOSIS — K219 Gastro-esophageal reflux disease without esophagitis: Secondary | ICD-10-CM | POA: Diagnosis not present

## 2021-05-22 DIAGNOSIS — J9621 Acute and chronic respiratory failure with hypoxia: Secondary | ICD-10-CM | POA: Diagnosis not present

## 2021-05-22 DIAGNOSIS — J449 Chronic obstructive pulmonary disease, unspecified: Secondary | ICD-10-CM | POA: Diagnosis not present

## 2021-05-22 DIAGNOSIS — J159 Unspecified bacterial pneumonia: Secondary | ICD-10-CM | POA: Diagnosis not present

## 2021-05-22 DIAGNOSIS — F332 Major depressive disorder, recurrent severe without psychotic features: Secondary | ICD-10-CM | POA: Diagnosis not present

## 2021-05-22 DIAGNOSIS — E871 Hypo-osmolality and hyponatremia: Secondary | ICD-10-CM | POA: Diagnosis not present

## 2021-05-22 DIAGNOSIS — E782 Mixed hyperlipidemia: Secondary | ICD-10-CM | POA: Diagnosis not present

## 2021-05-25 DIAGNOSIS — Z20822 Contact with and (suspected) exposure to covid-19: Secondary | ICD-10-CM | POA: Diagnosis not present

## 2021-06-14 DIAGNOSIS — E782 Mixed hyperlipidemia: Secondary | ICD-10-CM | POA: Diagnosis not present

## 2021-06-14 DIAGNOSIS — E538 Deficiency of other specified B group vitamins: Secondary | ICD-10-CM | POA: Diagnosis not present

## 2021-07-05 DIAGNOSIS — Z20822 Contact with and (suspected) exposure to covid-19: Secondary | ICD-10-CM | POA: Diagnosis not present

## 2021-07-19 ENCOUNTER — Inpatient Hospital Stay (HOSPITAL_COMMUNITY)
Admission: EM | Admit: 2021-07-19 | Discharge: 2021-07-25 | DRG: 191 | Disposition: A | Discharge status: Home / Self Care | Payer: Medicare Other | Attending: Internal Medicine | Admitting: Internal Medicine

## 2021-07-19 ENCOUNTER — Encounter (HOSPITAL_COMMUNITY): Payer: Self-pay | Admitting: Emergency Medicine

## 2021-07-19 ENCOUNTER — Emergency Department (HOSPITAL_COMMUNITY): Payer: Medicare Other

## 2021-07-19 ENCOUNTER — Other Ambulatory Visit: Payer: Self-pay

## 2021-07-19 DIAGNOSIS — I2510 Atherosclerotic heart disease of native coronary artery without angina pectoris: Secondary | ICD-10-CM | POA: Diagnosis present

## 2021-07-19 DIAGNOSIS — E559 Vitamin D deficiency, unspecified: Secondary | ICD-10-CM | POA: Diagnosis present

## 2021-07-19 DIAGNOSIS — H9191 Unspecified hearing loss, right ear: Secondary | ICD-10-CM | POA: Diagnosis present

## 2021-07-19 DIAGNOSIS — E871 Hypo-osmolality and hyponatremia: Secondary | ICD-10-CM | POA: Diagnosis not present

## 2021-07-19 DIAGNOSIS — I10 Essential (primary) hypertension: Secondary | ICD-10-CM | POA: Diagnosis present

## 2021-07-19 DIAGNOSIS — R590 Localized enlarged lymph nodes: Secondary | ICD-10-CM | POA: Diagnosis present

## 2021-07-19 DIAGNOSIS — J441 Chronic obstructive pulmonary disease with (acute) exacerbation: Secondary | ICD-10-CM | POA: Diagnosis not present

## 2021-07-19 DIAGNOSIS — E875 Hyperkalemia: Secondary | ICD-10-CM | POA: Diagnosis not present

## 2021-07-19 DIAGNOSIS — R918 Other nonspecific abnormal finding of lung field: Secondary | ICD-10-CM | POA: Diagnosis not present

## 2021-07-19 DIAGNOSIS — E440 Moderate protein-calorie malnutrition: Secondary | ICD-10-CM | POA: Diagnosis present

## 2021-07-19 DIAGNOSIS — J189 Pneumonia, unspecified organism: Secondary | ICD-10-CM | POA: Diagnosis not present

## 2021-07-19 DIAGNOSIS — Z888 Allergy status to other drugs, medicaments and biological substances status: Secondary | ICD-10-CM

## 2021-07-19 DIAGNOSIS — Z9981 Dependence on supplemental oxygen: Secondary | ICD-10-CM | POA: Diagnosis not present

## 2021-07-19 DIAGNOSIS — J9611 Chronic respiratory failure with hypoxia: Secondary | ICD-10-CM | POA: Diagnosis present

## 2021-07-19 DIAGNOSIS — J302 Other seasonal allergic rhinitis: Secondary | ICD-10-CM | POA: Diagnosis present

## 2021-07-19 DIAGNOSIS — F419 Anxiety disorder, unspecified: Secondary | ICD-10-CM | POA: Diagnosis present

## 2021-07-19 DIAGNOSIS — J439 Emphysema, unspecified: Secondary | ICD-10-CM | POA: Diagnosis not present

## 2021-07-19 DIAGNOSIS — Z20822 Contact with and (suspected) exposure to covid-19: Secondary | ICD-10-CM | POA: Diagnosis present

## 2021-07-19 DIAGNOSIS — R739 Hyperglycemia, unspecified: Secondary | ICD-10-CM | POA: Diagnosis not present

## 2021-07-19 DIAGNOSIS — G629 Polyneuropathy, unspecified: Secondary | ICD-10-CM | POA: Diagnosis not present

## 2021-07-19 DIAGNOSIS — Z79899 Other long term (current) drug therapy: Secondary | ICD-10-CM

## 2021-07-19 DIAGNOSIS — M797 Fibromyalgia: Secondary | ICD-10-CM | POA: Diagnosis present

## 2021-07-19 DIAGNOSIS — Z7982 Long term (current) use of aspirin: Secondary | ICD-10-CM

## 2021-07-19 DIAGNOSIS — M069 Rheumatoid arthritis, unspecified: Secondary | ICD-10-CM | POA: Diagnosis not present

## 2021-07-19 DIAGNOSIS — E669 Obesity, unspecified: Secondary | ICD-10-CM | POA: Diagnosis present

## 2021-07-19 DIAGNOSIS — G4733 Obstructive sleep apnea (adult) (pediatric): Secondary | ICD-10-CM | POA: Diagnosis present

## 2021-07-19 DIAGNOSIS — E876 Hypokalemia: Secondary | ICD-10-CM | POA: Diagnosis present

## 2021-07-19 DIAGNOSIS — Z803 Family history of malignant neoplasm of breast: Secondary | ICD-10-CM

## 2021-07-19 DIAGNOSIS — Z6836 Body mass index (BMI) 36.0-36.9, adult: Secondary | ICD-10-CM

## 2021-07-19 DIAGNOSIS — Z87891 Personal history of nicotine dependence: Secondary | ICD-10-CM

## 2021-07-19 DIAGNOSIS — E785 Hyperlipidemia, unspecified: Secondary | ICD-10-CM | POA: Diagnosis present

## 2021-07-19 DIAGNOSIS — H9190 Unspecified hearing loss, unspecified ear: Secondary | ICD-10-CM | POA: Diagnosis present

## 2021-07-19 DIAGNOSIS — Z825 Family history of asthma and other chronic lower respiratory diseases: Secondary | ICD-10-CM

## 2021-07-19 DIAGNOSIS — Z8701 Personal history of pneumonia (recurrent): Secondary | ICD-10-CM

## 2021-07-19 DIAGNOSIS — M8580 Other specified disorders of bone density and structure, unspecified site: Secondary | ICD-10-CM | POA: Diagnosis present

## 2021-07-19 DIAGNOSIS — J9621 Acute and chronic respiratory failure with hypoxia: Secondary | ICD-10-CM | POA: Diagnosis present

## 2021-07-19 DIAGNOSIS — T380X5A Adverse effect of glucocorticoids and synthetic analogues, initial encounter: Secondary | ICD-10-CM | POA: Diagnosis not present

## 2021-07-19 DIAGNOSIS — R059 Cough, unspecified: Secondary | ICD-10-CM | POA: Diagnosis not present

## 2021-07-19 DIAGNOSIS — Z981 Arthrodesis status: Secondary | ICD-10-CM

## 2021-07-19 DIAGNOSIS — K219 Gastro-esophageal reflux disease without esophagitis: Secondary | ICD-10-CM | POA: Diagnosis present

## 2021-07-19 DIAGNOSIS — F32A Depression, unspecified: Secondary | ICD-10-CM | POA: Diagnosis present

## 2021-07-19 DIAGNOSIS — Z7952 Long term (current) use of systemic steroids: Secondary | ICD-10-CM

## 2021-07-19 DIAGNOSIS — K5900 Constipation, unspecified: Secondary | ICD-10-CM | POA: Diagnosis present

## 2021-07-19 DIAGNOSIS — J9610 Chronic respiratory failure, unspecified whether with hypoxia or hypercapnia: Secondary | ICD-10-CM | POA: Diagnosis present

## 2021-07-19 DIAGNOSIS — Z7951 Long term (current) use of inhaled steroids: Secondary | ICD-10-CM

## 2021-07-19 DIAGNOSIS — E66812 Obesity, class 2: Secondary | ICD-10-CM | POA: Diagnosis present

## 2021-07-19 DIAGNOSIS — Z9049 Acquired absence of other specified parts of digestive tract: Secondary | ICD-10-CM

## 2021-07-19 LAB — COMPREHENSIVE METABOLIC PANEL
ALT: 13 U/L (ref 0–44)
AST: 16 U/L (ref 15–41)
Albumin: 3.2 g/dL — ABNORMAL LOW (ref 3.5–5.0)
Alkaline Phosphatase: 56 U/L (ref 38–126)
Anion gap: 5 (ref 5–15)
BUN: 12 mg/dL (ref 8–23)
CO2: 28 mmol/L (ref 22–32)
Calcium: 7.8 mg/dL — ABNORMAL LOW (ref 8.9–10.3)
Chloride: 106 mmol/L (ref 98–111)
Creatinine, Ser: 0.78 mg/dL (ref 0.44–1.00)
GFR, Estimated: >60 mL/min (ref >60)
Glucose, Bld: 126 mg/dL — ABNORMAL HIGH (ref 70–99)
Potassium: 3.1 mmol/L — ABNORMAL LOW (ref 3.5–5.1)
Sodium: 139 mmol/L (ref 135–145)
Total Bilirubin: 0.6 mg/dL (ref 0.3–1.2)
Total Protein: 6.2 g/dL — ABNORMAL LOW (ref 6.5–8.1)

## 2021-07-19 LAB — CBC WITH DIFFERENTIAL/PLATELET
Abs Immature Granulocytes: 0.03 10*3/uL (ref 0.00–0.07)
Basophils Absolute: 0 10*3/uL (ref 0.0–0.1)
Basophils Relative: 0 %
Eosinophils Absolute: 0.1 10*3/uL (ref 0.0–0.5)
Eosinophils Relative: 1 %
HCT: 43.8 % (ref 36.0–46.0)
Hemoglobin: 13.8 g/dL (ref 12.0–15.0)
Immature Granulocytes: 0 %
Lymphocytes Relative: 17 %
Lymphs Abs: 1.5 10*3/uL (ref 0.7–4.0)
MCH: 27.9 pg (ref 26.0–34.0)
MCHC: 31.5 g/dL (ref 30.0–36.0)
MCV: 88.7 fL (ref 80.0–100.0)
Monocytes Absolute: 0.6 10*3/uL (ref 0.1–1.0)
Monocytes Relative: 7 %
Neutro Abs: 6.9 10*3/uL (ref 1.7–7.7)
Neutrophils Relative %: 75 %
Platelets: 419 10*3/uL — ABNORMAL HIGH (ref 150–400)
RBC: 4.94 MIL/uL (ref 3.87–5.11)
RDW: 13 % (ref 11.5–15.5)
WBC: 9.2 10*3/uL (ref 4.0–10.5)
nRBC: 0 % (ref 0.0–0.2)

## 2021-07-19 LAB — PROCALCITONIN: Procalcitonin: <0.1 ng/mL

## 2021-07-19 LAB — CBC
HCT: 44.2 % (ref 36.0–46.0)
Hemoglobin: 14.1 g/dL (ref 12.0–15.0)
MCH: 28.1 pg (ref 26.0–34.0)
MCHC: 31.9 g/dL (ref 30.0–36.0)
MCV: 88.2 fL (ref 80.0–100.0)
Platelets: 433 10*3/uL — ABNORMAL HIGH (ref 150–400)
RBC: 5.01 MIL/uL (ref 3.87–5.11)
RDW: 13 % (ref 11.5–15.5)
WBC: 9.1 10*3/uL (ref 4.0–10.5)
nRBC: 0 % (ref 0.0–0.2)

## 2021-07-19 LAB — MAGNESIUM: Magnesium: 1.7 mg/dL (ref 1.7–2.4)

## 2021-07-19 LAB — RESP PANEL BY RT-PCR (FLU A&B, COVID) ARPGX2
Influenza A by PCR: NEGATIVE
Influenza B by PCR: NEGATIVE
SARS Coronavirus 2 by RT PCR: NEGATIVE

## 2021-07-19 LAB — CREATININE, SERUM
Creatinine, Ser: 0.87 mg/dL (ref 0.44–1.00)
GFR, Estimated: >60 mL/min (ref >60)

## 2021-07-19 LAB — BRAIN NATRIURETIC PEPTIDE: B Natriuretic Peptide: 41.8 pg/mL (ref 0.0–100.0)

## 2021-07-19 MED ORDER — NAPHAZOLINE-PHENIRAMINE 0.025-0.3 % OP SOLN: 1.0000 [drp] | Freq: Four times a day (QID) | OPHTHALMIC | Status: DC | PRN

## 2021-07-19 MED ORDER — GUAIFENESIN 100 MG/5ML PO LIQD: 5.0000 mL | ORAL | Status: DC | PRN

## 2021-07-19 MED ORDER — ACETAMINOPHEN 650 MG RE SUPP: 650.0000 mg | Freq: Four times a day (QID) | RECTAL | Status: DC | PRN

## 2021-07-19 MED ORDER — ACETAMINOPHEN 325 MG PO TABS
650.0000 mg | ORAL_TABLET | Freq: Four times a day (QID) | ORAL | Status: DC | PRN
  Administered 2021-07-22: 650 mg via ORAL
  Filled 2021-07-19: qty 2

## 2021-07-19 MED ORDER — METHYLPREDNISOLONE SODIUM SUCC 40 MG IJ SOLR: 40.0000 mg | Freq: Every day | INTRAMUSCULAR | Status: DC

## 2021-07-19 MED ORDER — ALBUTEROL SULFATE (2.5 MG/3ML) 0.083% IN NEBU
2.5000 mg | INHALATION_SOLUTION | RESPIRATORY_TRACT | Status: DC | PRN
  Administered 2021-07-19: 2.5 mg via RESPIRATORY_TRACT
  Filled 2021-07-19: qty 3

## 2021-07-19 MED ORDER — AZITHROMYCIN 250 MG PO TABS
500.0000 mg | ORAL_TABLET | Freq: Every day | ORAL | Status: DC
  Administered 2021-07-20: 500 mg via ORAL
  Filled 2021-07-19: qty 2

## 2021-07-19 MED ORDER — DIPHENHYDRAMINE HCL 25 MG PO CAPS: 25.0000 mg | ORAL_CAPSULE | Freq: Four times a day (QID) | ORAL | Status: DC | PRN

## 2021-07-19 MED ORDER — ALBUTEROL SULFATE HFA 108 (90 BASE) MCG/ACT IN AERS: 2.0000 | INHALATION_SPRAY | RESPIRATORY_TRACT | Status: DC | PRN

## 2021-07-19 MED ORDER — SODIUM CHLORIDE 0.9 % IV SOLN: 500.0000 mg | INTRAVENOUS | Status: DC

## 2021-07-19 MED ORDER — POTASSIUM CHLORIDE CRYS ER 20 MEQ PO TBCR
40.0000 meq | EXTENDED_RELEASE_TABLET | Freq: Two times a day (BID) | ORAL | Status: AC
Start: 2021-07-19 — End: 2021-07-20
  Administered 2021-07-19 – 2021-07-20 (×2): 40 meq via ORAL
  Filled 2021-07-19 (×2): qty 2

## 2021-07-19 MED ORDER — ENOXAPARIN SODIUM 30 MG/0.3ML IJ SOSY
30.0000 mg | PREFILLED_SYRINGE | INTRAMUSCULAR | Status: DC
  Administered 2021-07-19 – 2021-07-20 (×2): 30 mg via SUBCUTANEOUS
  Filled 2021-07-19 (×2): qty 0.3

## 2021-07-19 MED ORDER — DEXAMETHASONE SODIUM PHOSPHATE 10 MG/ML IJ SOLN
10.0000 mg | Freq: Once | INTRAMUSCULAR | Status: AC
Start: 2021-07-19 — End: 2021-07-19
  Administered 2021-07-19: 10 mg via INTRAVENOUS
  Filled 2021-07-19: qty 1

## 2021-07-19 MED ORDER — SODIUM CHLORIDE 0.9 % IV SOLN
500.0000 mg | Freq: Once | INTRAVENOUS | Status: AC
  Administered 2021-07-19: 500 mg via INTRAVENOUS
  Filled 2021-07-19: qty 5

## 2021-07-19 MED ORDER — AZITHROMYCIN 250 MG PO TABS: 500.0000 mg | ORAL_TABLET | Freq: Every day | ORAL | Status: DC

## 2021-07-19 MED ORDER — GUAIFENESIN ER 600 MG PO TB12
600.0000 mg | ORAL_TABLET | Freq: Two times a day (BID) | ORAL | Status: DC
  Administered 2021-07-19 – 2021-07-23 (×8): 600 mg via ORAL
  Filled 2021-07-19 (×8): qty 1

## 2021-07-19 MED ORDER — IPRATROPIUM-ALBUTEROL 0.5-2.5 (3) MG/3ML IN SOLN
3.0000 mL | Freq: Four times a day (QID) | RESPIRATORY_TRACT | Status: DC
  Administered 2021-07-19 – 2021-07-20 (×3): 3 mL via RESPIRATORY_TRACT
  Filled 2021-07-19 (×4): qty 3

## 2021-07-19 MED ORDER — ALBUTEROL SULFATE (2.5 MG/3ML) 0.083% IN NEBU
5.0000 mg | INHALATION_SOLUTION | Freq: Once | RESPIRATORY_TRACT | Status: AC
  Administered 2021-07-19: 5 mg via RESPIRATORY_TRACT
  Filled 2021-07-19: qty 6

## 2021-07-19 NOTE — ED Triage Notes (Signed)
Per pt, states she has not felt well in 5 days-states cough, congestion and B/L eye irritation and itching- ?

## 2021-07-19 NOTE — ED Notes (Signed)
Patient transported to X-ray 

## 2021-07-19 NOTE — ED Triage Notes (Signed)
Patient place on 2L -patient was not on O2 during her transport via friend to ED ?

## 2021-07-19 NOTE — ED Notes (Signed)
When walking by pt's room, pt seen at edge of doorway, nasal canula and most monitoring removed, stating she needed to walk to the bathroom. Sats 87% on RA, O2 reapplied and sats improving. BSC set up for pt to use in room, and all monitors reapplied. ?

## 2021-07-19 NOTE — H&P (Signed)
?History and Physical  ? ? ?Patient: [name] HYW:[id number] DOB: [birth year] ?DOA: 07/19/2021 ?DOS: the patient was seen and examined on 07/19/2021 ?PCP: [name], MD  ?Patient coming from: Home ? ?Chief Complaint:  ?Chief Complaint  ?Patient presents with  ? Cough  ? ?HPI: [name] is a 86 y.o. female with medical history significant of HLD, RA, COPD on home O2 (2L). Presenting with cough She reports that for the last 5 days she has had increased cough and weakness. She tried her regular inhalers, but they didn't help. She denies any sick contacts. She believes she had fever at home, however, she is unable to tell me any temps. When her symptoms did not improve today, her family insisted that she come to the ED. She denies any other aggravating or alleviating factors.  ? ?Review of Systems: As mentioned in the history of present illness. All other systems reviewed and are negative. ?Past Medical History:  ?Diagnosis Date  ? ACID REFLUX DISEASE 05/15/2007  ? Qualifier: Diagnosis of  By: [name] MD, Christena Deem   ? Acute and chronic respiratory failure with hypoxia (HCC)   ? Acute on chronic respiratory failure with hypoxia ([place]) 06/08/2018  ? Acute respiratory failure with hypercapnia (HCC)   ? Acute respiratory failure with hypoxia ([place]) 07/29/2017  ? Anemia 05/20/2016  ? Arthritis   ? Bladder neoplasm   ? Body mass index 34.0-34.9, adult   ? Bronchitis 07/06/2011  ? Chest pain 07/06/2011  ? Chronic cough   ? Chronic respiratory failure ([place])   ? Community acquired pneumonia   ? Constipation 07/03/2011  ? COPD (chronic obstructive pulmonary disease) ([place])   ? PULMOLOGIST-  DR [name]--  GOLD III W/  CHRONIC RESPIRATORY FAILURE--  O2 DEPENDENT  ? COPD with acute exacerbation ([place]) 06/29/2011  ? Coronary arteriosclerosis   ? Cough productive of purulent sputum   ? Depression   ? Dizziness   ? Dyslipidemia   ? Falls frequently   ? Fibromyalgia   ? GERD (gastroesophageal reflux disease)   ? Hearing loss in right ear    ? Hematuria   ? History of nephritis   ? as child dx w/ Bright's disease (glomerulonephritis)  ? HLD (hyperlipidemia) 05/15/2007  ? Qualifier: Diagnosis of  By: [name] MD, Christena Deem   ? Hyperglycemia 05/20/2016  ? Hyperlipidemia   ? Hypomagnesemia   ? Hypoxemia   ? Iron deficiency anemia, unspecified   ? Leukopenia   ? Low vitamin D level   ? Mild obstructive sleep apnea   ? no cpap recommendation  ? Neuropathy 07/29/2017  ? Neuropathy, peripheral, idiopathic   ? Osteopenia   ? Other allergic rhinitis   ? Pelvic pain in female   ? Polyarthralgia   ? PONV (postoperative nausea and vomiting)   ? Pre-diabetes   ? Rheumatoid arthritis ([place]) 07/29/2017  ? Shoulder impingement syndrome 03/25/2015  ? Sleep disorder, unspecified   ? SOB (shortness of breath)   ? Spinal stenosis   ? Stage 3 severe COPD by GOLD classification ([place]) 06/19/2007  ? Followed in Pulmonary clinic/ [facility]/ [place]  - hfa 90% 05/15/2012  - PFTs 05/15/2012 FEV1  0.61 ( 36%) ratio 57 and 21% better p B2 and DLCO 44%  106 corrected  - Referred to [facility] 05/15/12 - Rec pulmonary f/u prn 02/11/13    ? Supplemental oxygen dependent   ? 2L  via Nasal Canula --  activity and nighttime  ? Tachycardia   ?  Urinary incontinence in female   ? ?Past Surgical History:  ?Procedure Laterality Date  ? APPENDECTOMY  1971  ? CARDIAC CATHETERIZATION  05-08-2007  dr Marlou Porch  ? minor coronary plaquing w/ no significant CAD/  20% mLAD,  10% mRCA,  perserved LV, ef 60-65%  ? CATARACT EXTRACTION W/ INTRAOCULAR LENS  IMPLANT, BILATERAL  2001 approx  ? CHOLECYSTECTOMY  1988  ? CYSTOSCOPY W/ RETROGRADES Bilateral 09/24/2014  ? Procedure: CYSTOSCOPY WITH RETROGRADE PYELOGRAM;  Surgeon: Festus Aloe, MD;  Location: [facility];  Service: Urology;  Laterality: Bilateral;  ? CYSTOSCOPY WITH BIOPSY N/A 09/24/2014  ? Procedure: CYSTOSCOPY WITH BIOPSY;  Surgeon: Festus Aloe, MD;  Location: [facility];  Service: Urology;  Laterality: N/A;  ?  ESOPHAGOGASTRODUODENOSCOPY  last one 09-09-2008  ? FULGURATION OF BLADDER TUMOR N/A 09/24/2014  ? Procedure: FULGURATION OF BLADDER TUMOR;  Surgeon: Festus Aloe, MD;  Location: [facility];  Service: Urology;  Laterality: N/A;  ? [place]  ? POSTERIOR LUMBAR FUSION  10-07-2007  ? bilateral laminectomy L4 and bilateral L4-5 diskectomy w/ fusion  ? TONSILLECTOMY  as child  ? TOTAL ABDOMINAL HYSTERECTOMY W/ BILATERAL SALPINGOOPHORECTOMY  1973  ? TRANSTHORACIC ECHOCARDIOGRAM  07-07-2011  ? normal echo,  ef 65-70%  ? ?Social History:  reports that she quit smoking about 10 years ago. Her smoking use included cigarettes. She has a 180.00 pack-year smoking history. She has never used smokeless tobacco. She reports that she does not drink alcohol and does not use drugs. ? ?Allergies  ?Allergen Reactions  ? Kapidex [Dexlansoprazole] Other (See Comments)  ?  Stomach pain, diarrhea  ? Zegerid [Omeprazole] Other (See Comments)  ?  Burning in chest  ? ? ?Family History  ?Problem Relation Age of Onset  ? Asthma Maternal Grandmother   ? Breast cancer Maternal Grandmother   ? ? ?Prior to Admission medications   ?Medication Sig Start Date End Date Taking? Authorizing Provider  ?acetaminophen (TYLENOL) 650 MG CR tablet Take 1,300 mg by mouth every 8 (eight) hours as needed for pain.    [provider]  ?albuterol (PROVENTIL) (2.5 MG/3ML) 0.083% nebulizer solution Take 3 mLs (2.5 mg total) by nebulization every 4 (four) hours as needed for wheezing or shortness of breath. For shortness of breath. 05/18/21   Georgette Shell, MD  ?aspirin EC 81 MG tablet Take 81 mg by mouth daily.    [provider]  ?atorvastatin (LIPITOR) 40 MG tablet Take 40 mg by mouth every morning.     [provider]  ?azelastine (ASTELIN) 0.1 % nasal spray Place 1 spray into the nose 2 (two) times daily as needed for allergies.     [provider]  ?Calcium-Vitamin D 500-3.125 MG-MCG TABS  Take 1 tablet by mouth daily.    [provider]  ?carboxymethylcellulose (REFRESH PLUS) 0.5 % SOLN 2 drops 2 (two) times daily as needed (dry eye).    [provider]  ?cetirizine (ZYRTEC) 10 MG tablet Take 1 tablet (10 mg total) by mouth every morning. 08/02/17   Roxan Hockey, MD  ?Cholecalciferol (VITAMIN D) 2000 UNITS tablet Take 4,000 Units by mouth daily.     [provider]  ?esomeprazole (NEXIUM) 20 MG capsule Take 20 mg by mouth daily at 12 noon.    [provider]  ?Fluticasone-Umeclidin-Vilant (TRELEGY ELLIPTA) 100-62.5-25 MCG/ACT AEPB Take 1 puff by mouth daily. 05/18/21   Georgette Shell, MD  ?folic acid (FOLVITE) 1 MG  tablet Take 1 tablet (1 mg total) by mouth daily. 08/02/17   Roxan Hockey, MD  ?gabapentin (NEURONTIN) 100 MG capsule Take 2-3 capsules (200-300 mg total) by mouth 3 (three) times daily. 200 mg every AM and at noon and 300 mg QHS 08/02/17   Roxan Hockey, MD  ?hydroxychloroquine (PLAQUENIL) 200 MG tablet Take 200 mg by mouth daily.    [provider]  ?Polyethyl Glycol-Propyl Glycol (SYSTANE) 0.4-0.3 % SOLN Apply 1 drop to eye daily as needed (dry eyes).    [provider]  ?polyethylene glycol (MIRALAX / GLYCOLAX) packet Take 17 g by mouth daily. ?Patient taking differently: Take 17 g by mouth daily as needed for moderate constipation. 08/03/17   Roxan Hockey, MD  ?predniSONE (DELTASONE) 5 MG tablet Take 5 mg by mouth daily with breakfast. 04/17/21   [provider]  ?sertraline (ZOLOFT) 100 MG tablet Take 100 mg by mouth daily.    [provider]  ?tiotropium (SPIRIVA) 18 MCG inhalation capsule Place 1 capsule (18 mcg total) into inhaler and inhale every morning. 08/02/17   Roxan Hockey, MD  ? ? ?Physical Exam: ?Vitals:  ? 07/19/21 1336 07/19/21 1415 07/19/21 1445  ?BP: 123/64 130/71 94/80  ?Pulse: 96 85 91  ?Resp: '17 18 17  '$ ?Temp: 97.9 ?F (36.6 ?C)    ?TempSrc: Oral    ?SpO2: (!) 88% 97% 99%   ? ?General: 86 y.o. female resting in bed in NAD ?Eyes: PERRL, normal sclera ?ENMT: Nares patent w/o discharge, orophaynx clear, dentition normal, ears w/o discharge/lesions/ulcers ?Neck: Supple, trachea midline ?Card

## 2021-07-19 NOTE — ED Provider Notes (Signed)
?[facility] DEPT ?Provider Note ? ? ?CSN: [id number] ?Arrival date & time: 07/19/21  1326 ? ?  ? ?History ? ?Chief Complaint  ?Patient presents with  ? Cough  ? ? ?[name] is a 86 y.o. female. ? ?HPI ?Patient presents with her sister assists with the history. ?Patient has multiple medical issues, and was recently hospitalized for pneumonia.  She presents today with worsening dyspnea, fatigue.  Though she seemingly has had fatigue since discharge, the dyspnea is possibly new.  No recent change in medication, diet.  She notes that she is essentially bedbound at this point. ?She is a former smoker, though she quit almost 1 decade ago. ? ?  ? ?Home Medications ?Prior to Admission medications   ?Medication Sig Start Date End Date Taking? Authorizing Provider  ?acetaminophen (TYLENOL) 650 MG CR tablet Take 1,300 mg by mouth every 8 (eight) hours as needed for pain.    [provider]  ?albuterol (PROVENTIL) (2.5 MG/3ML) 0.083% nebulizer solution Take 3 mLs (2.5 mg total) by nebulization every 4 (four) hours as needed for wheezing or shortness of breath. For shortness of breath. 05/18/21   Georgette Shell, MD  ?aspirin EC 81 MG tablet Take 81 mg by mouth daily.    [provider]  ?atorvastatin (LIPITOR) 40 MG tablet Take 40 mg by mouth every morning.     [provider]  ?azelastine (ASTELIN) 0.1 % nasal spray Place 1 spray into the nose 2 (two) times daily as needed for allergies.     [provider]  ?Calcium-Vitamin D 500-3.125 MG-MCG TABS Take 1 tablet by mouth daily.    [provider]  ?carboxymethylcellulose (REFRESH PLUS) 0.5 % SOLN 2 drops 2 (two) times daily as needed (dry eye).    [provider]  ?cetirizine (ZYRTEC) 10 MG tablet Take 1 tablet (10 mg total) by mouth every morning. 08/02/17   Roxan Hockey, MD  ?Cholecalciferol (VITAMIN D) 2000 UNITS tablet Take 4,000 Units by mouth daily.     [provider]  ?esomeprazole (NEXIUM) 20 MG capsule Take 20 mg by mouth daily at 12 noon.    [provider]  ?Fluticasone-Umeclidin-Vilant (TRELEGY ELLIPTA) 100-62.5-25 MCG/ACT AEPB Take 1 puff by mouth daily. 05/18/21   Georgette Shell, MD  ?folic acid (FOLVITE) 1 MG tablet Take 1 tablet (1 mg total) by mouth daily. 08/02/17   Roxan Hockey, MD  ?gabapentin (NEURONTIN) 100 MG capsule Take 2-3 capsules (200-300 mg total) by mouth 3 (three) times daily. 200 mg every AM and at noon and 300 mg QHS 08/02/17   Roxan Hockey, MD  ?hydroxychloroquine (PLAQUENIL) 200 MG tablet Take 200 mg by mouth daily.    [provider]  ?Polyethyl Glycol-Propyl Glycol (SYSTANE) 0.4-0.3 % SOLN Apply 1 drop to eye daily as needed (dry eyes).    [provider]  ?polyethylene glycol (MIRALAX / GLYCOLAX) packet Take 17 g by mouth daily. ?Patient taking differently: Take 17 g by mouth daily as needed for moderate constipation. 08/03/17   Roxan Hockey, MD  ?predniSONE (DELTASONE) 5 MG tablet Take 5 mg by mouth daily with breakfast. 04/17/21   [provider]  ?sertraline (ZOLOFT) 100 MG tablet Take 100 mg by mouth daily.    [provider]  ?tiotropium (SPIRIVA) 18 MCG inhalation capsule Place 1 capsule (18 mcg total) into inhaler and inhale every morning. 08/02/17   Roxan Hockey, MD  ?   ? ?Allergies    ?Kapidex [  dexlansoprazole] and Zegerid [omeprazole]   ? ?Review of Systems   ?Review of Systems  ?Constitutional:   ?     Per HPI, otherwise negative  ?HENT:    ?     Per HPI, otherwise negative  ?Respiratory:    ?     Per HPI, otherwise negative  ?Cardiovascular:   ?     Per HPI, otherwise negative  ?Gastrointestinal:  Negative for vomiting.  ?Endocrine:  ?     Negative aside from HPI  ?Genitourinary:   ?     Neg aside from HPI   ?Musculoskeletal:   ?     Per HPI, otherwise negative  ?Skin: Negative.   ?Neurological:  Positive for weakness. Negative for syncope.  ? ?Physical Exam ?Updated  Vital Signs ?BP 94/80   Pulse 91   Temp 97.9 ?F (36.6 ?C) (Oral)   Resp 17   SpO2 99%  ?Physical Exam ?Vitals and nursing note reviewed.  ?Constitutional:   ?   Appearance: She is well-developed. She is ill-appearing.  ?HENT:  ?   Head: Normocephalic and atraumatic.  ?Eyes:  ?   Conjunctiva/sclera: Conjunctivae normal.  ? ?Cardiovascular:  ?   Rate and Rhythm: Normal rate and regular rhythm.  ?Pulmonary:  ?   Breath sounds: Decreased air movement present. Wheezing present.  ?Abdominal:  ?   General: There is no distension.  ?Skin: ?   General: Skin is warm and dry.  ?Neurological:  ?   Mental Status: She is alert and oriented to person, place, and time.  ?   Cranial Nerves: No cranial nerve deficit.  ?   Motor: Atrophy present.  ?Psychiatric:     ?   Mood and Affect: Mood normal.  ? ? ?ED Results / Procedures / Treatments   ?Labs ?(all labs ordered are listed, but only abnormal results are displayed) ?Labs Reviewed  ?COMPREHENSIVE METABOLIC PANEL - Abnormal; Notable for the following components:  ?    Result Value  ? Potassium 3.1 (*)   ? Glucose, Bld 126 (*)   ? Calcium 7.8 (*)   ? Total Protein 6.2 (*)   ? Albumin 3.2 (*)   ? All other components within normal limits  ?CBC WITH DIFFERENTIAL/PLATELET - Abnormal; Notable for the following components:  ? Platelets 419 (*)   ? All other components within normal limits  ?RESP PANEL BY RT-PCR (FLU A&B, COVID) [id number]  ?BRAIN NATRIURETIC PEPTIDE  ? ? ?EKG ?None ? ?Radiology ?DG Chest 2 View ? ?Result Date: 07/19/2021 ?CLINICAL DATA:  Cough EXAM: CHEST - 2 VIEW COMPARISON:  05/11/2021. FINDINGS: The heart size and mediastinal contours are within normal limits. Redemonstrated hyperinflation and emphysema. Mild peribronchial thickening. No focal pulmonary opacity. No pleural effusion or pneumothorax. No acute osseous abnormality. IMPRESSION: Peribronchial thickening, which can be seen in the setting of small airways disease (viral versus reactive). No focal pulmonary  opacity to suggest pneumonia. Electronically Signed   By: Merilyn Baba M.D.   On: 07/19/2021 14:04  ? ?CT Chest Wo Contrast ? ?Result Date: 07/19/2021 ?CLINICAL DATA:  Pneumonia, cough EXAM: CT CHEST WITHOUT CONTRAST TECHNIQUE: Multidetector CT imaging of the chest was performed following the standard protocol without IV contrast. RADIATION DOSE REDUCTION: This exam was performed according to the departmental dose-optimization program which includes automated exposure control, adjustment of the mA and/or kV according to patient size and/or use of iterative reconstruction technique. COMPARISON:  Previous studies including chest radiograph done earlier today and CT done on  05/16/2019 FINDINGS: Cardiovascular: There are scattered coronary artery calcifications. There are scattered calcifications in the thoracic aorta. Ascending thoracic aorta measures 3.7 cm in diameter. Mediastinum/Nodes: There are few slightly enlarged lymph nodes in the mediastinum largest in the subcarinal region measuring 15 mm in short axis. There is increase in size of mediastinal lymph nodes. Lungs/Pleura: There is no focal pulmonary consolidation. There are linear densities in both apices suggesting scarring. Small linear density in the posterior right lower lung fields may suggest scarring. Small linear densities seen in the lingula. No new discrete lung nodules are seen. There is no pleural effusion or pneumothorax. Upper Abdomen: Surgical clips are seen in gallbladder fossa. Musculoskeletal: Degenerative changes are noted with encroachment of neural foramina in the lower cervical spine at C6-C7 and C7-T1 levels. IMPRESSION: There is no focal pulmonary consolidation. There is no pleural effusion or pneumothorax. Small linear densities in both apices and lower lung fields may suggest scarring. There are slightly enlarged lymph nodes in the mediastinum largest measuring 1.5 cm in short axis in the subcarinal region. There is interval increase  in size of mediastinal lymph nodes. Findings may suggest reactive hyperplasia or active inflammatory or neoplastic process. Short-term follow-up CT in 3 months may be considered. Other findings as described in t

## 2021-07-20 DIAGNOSIS — M069 Rheumatoid arthritis, unspecified: Secondary | ICD-10-CM

## 2021-07-20 DIAGNOSIS — E876 Hypokalemia: Secondary | ICD-10-CM | POA: Diagnosis not present

## 2021-07-20 DIAGNOSIS — J441 Chronic obstructive pulmonary disease with (acute) exacerbation: Secondary | ICD-10-CM | POA: Diagnosis not present

## 2021-07-20 LAB — COMPREHENSIVE METABOLIC PANEL
ALT: 13 U/L (ref 0–44)
AST: 16 U/L (ref 15–41)
Albumin: 3.5 g/dL (ref 3.5–5.0)
Alkaline Phosphatase: 55 U/L (ref 38–126)
Anion gap: 8 (ref 5–15)
BUN: 20 mg/dL (ref 8–23)
CO2: 24 mmol/L (ref 22–32)
Calcium: 8.9 mg/dL (ref 8.9–10.3)
Chloride: 103 mmol/L (ref 98–111)
Creatinine, Ser: 0.73 mg/dL (ref 0.44–1.00)
GFR, Estimated: >60 mL/min (ref >60)
Glucose, Bld: 139 mg/dL — ABNORMAL HIGH (ref 70–99)
Potassium: 4.5 mmol/L (ref 3.5–5.1)
Sodium: 135 mmol/L (ref 135–145)
Total Bilirubin: 0.3 mg/dL (ref 0.3–1.2)
Total Protein: 6.5 g/dL (ref 6.5–8.1)

## 2021-07-20 LAB — RESPIRATORY PANEL BY PCR

## 2021-07-20 LAB — CBC
HCT: 40.8 % (ref 36.0–46.0)
Hemoglobin: 12.7 g/dL (ref 12.0–15.0)
MCH: 27.4 pg (ref 26.0–34.0)
MCHC: 31.1 g/dL (ref 30.0–36.0)
MCV: 88.1 fL (ref 80.0–100.0)
Platelets: 393 10*3/uL (ref 150–400)
RBC: 4.63 MIL/uL (ref 3.87–5.11)
RDW: 12.9 % (ref 11.5–15.5)
WBC: 6.9 10*3/uL (ref 4.0–10.5)
nRBC: 0 % (ref 0.0–0.2)

## 2021-07-20 LAB — GLUCOSE, CAPILLARY
Glucose-Capillary: 137 mg/dL — ABNORMAL HIGH (ref 70–99)
Glucose-Capillary: 151 mg/dL — ABNORMAL HIGH (ref 70–99)

## 2021-07-20 MED ORDER — AZITHROMYCIN 250 MG PO TABS
250.0000 mg | ORAL_TABLET | Freq: Every day | ORAL | Status: AC
  Administered 2021-07-21 – 2021-07-23 (×3): 250 mg via ORAL
  Filled 2021-07-20 (×4): qty 1

## 2021-07-20 MED ORDER — INSULIN ASPART 100 UNIT/ML IJ SOLN
0.0000 [IU] | Freq: Three times a day (TID) | INTRAMUSCULAR | Status: DC
  Administered 2021-07-20 – 2021-07-22 (×3): 1 [IU] via SUBCUTANEOUS
  Administered 2021-07-22: 2 [IU] via SUBCUTANEOUS
  Administered 2021-07-23 – 2021-07-24 (×3): 1 [IU] via SUBCUTANEOUS

## 2021-07-20 MED ORDER — POLYETHYLENE GLYCOL 3350 17 G PO PACK: 17.0000 g | PACK | Freq: Every day | ORAL | Status: DC | PRN

## 2021-07-20 MED ORDER — IPRATROPIUM-ALBUTEROL 0.5-2.5 (3) MG/3ML IN SOLN: 3.0000 mL | Freq: Two times a day (BID) | RESPIRATORY_TRACT | Status: DC

## 2021-07-20 MED ORDER — METHYLPREDNISOLONE SODIUM SUCC 125 MG IJ SOLR
60.0000 mg | Freq: Two times a day (BID) | INTRAMUSCULAR | Status: DC
Start: 2021-07-20 — End: 2021-07-21
  Administered 2021-07-20 – 2021-07-21 (×3): 60 mg via INTRAVENOUS
  Filled 2021-07-20 (×3): qty 2

## 2021-07-20 MED ORDER — HYDRALAZINE HCL 20 MG/ML IJ SOLN: 10.0000 mg | INTRAMUSCULAR | Status: DC | PRN

## 2021-07-20 MED ORDER — PANTOPRAZOLE SODIUM 40 MG PO TBEC
40.0000 mg | DELAYED_RELEASE_TABLET | Freq: Every day | ORAL | Status: DC
Start: 2021-07-20 — End: 2021-07-25
  Administered 2021-07-20 – 2021-07-25 (×6): 40 mg via ORAL
  Filled 2021-07-20 (×3): qty 1

## 2021-07-20 MED ORDER — TRAZODONE HCL 50 MG PO TABS
50.0000 mg | ORAL_TABLET | Freq: Every evening | ORAL | Status: DC | PRN
  Administered 2021-07-22: 50 mg via ORAL
  Filled 2021-07-20: qty 1

## 2021-07-20 MED ORDER — LORATADINE 10 MG PO TABS
10.0000 mg | ORAL_TABLET | Freq: Every day | ORAL | Status: DC
  Administered 2021-07-20 – 2021-07-25 (×6): 10 mg via ORAL
  Filled 2021-07-20 (×6): qty 1

## 2021-07-20 MED ORDER — GABAPENTIN 100 MG PO CAPS
200.0000 mg | ORAL_CAPSULE | ORAL | Status: DC
  Administered 2021-07-20 – 2021-07-25 (×11): 200 mg via ORAL
  Filled 2021-07-20 (×11): qty 2

## 2021-07-20 MED ORDER — ASPIRIN EC 81 MG PO TBEC
81.0000 mg | DELAYED_RELEASE_TABLET | Freq: Every day | ORAL | Status: DC
  Administered 2021-07-20 – 2021-07-25 (×6): 81 mg via ORAL
  Filled 2021-07-20 (×6): qty 1

## 2021-07-20 MED ORDER — OXYCODONE HCL 5 MG PO TABS: 5.0000 mg | ORAL_TABLET | ORAL | Status: DC | PRN

## 2021-07-20 MED ORDER — FOLIC ACID 1 MG PO TABS
1.0000 mg | ORAL_TABLET | Freq: Every day | ORAL | Status: DC
  Administered 2021-07-20 – 2021-07-25 (×6): 1 mg via ORAL
  Filled 2021-07-20 (×6): qty 1

## 2021-07-20 MED ORDER — ATORVASTATIN CALCIUM 40 MG PO TABS
40.0000 mg | ORAL_TABLET | Freq: Every morning | ORAL | Status: DC
  Administered 2021-07-20 – 2021-07-25 (×6): 40 mg via ORAL
  Filled 2021-07-20 (×6): qty 1

## 2021-07-20 MED ORDER — ADULT MULTIVITAMIN W/MINERALS CH
1.0000 | ORAL_TABLET | Freq: Every day | ORAL | Status: DC
  Administered 2021-07-20 – 2021-07-25 (×6): 1 via ORAL
  Filled 2021-07-20 (×6): qty 1

## 2021-07-20 MED ORDER — SERTRALINE HCL 50 MG PO TABS
150.0000 mg | ORAL_TABLET | Freq: Every day | ORAL | Status: DC
  Administered 2021-07-20 – 2021-07-25 (×6): 150 mg via ORAL
  Filled 2021-07-20 (×6): qty 1

## 2021-07-20 MED ORDER — GABAPENTIN 100 MG PO CAPS: 200.0000 mg | ORAL_CAPSULE | Freq: Three times a day (TID) | ORAL | Status: DC

## 2021-07-20 MED ORDER — IPRATROPIUM-ALBUTEROL 0.5-2.5 (3) MG/3ML IN SOLN
3.0000 mL | Freq: Three times a day (TID) | RESPIRATORY_TRACT | Status: DC
  Administered 2021-07-20 – 2021-07-21 (×4): 3 mL via RESPIRATORY_TRACT
  Filled 2021-07-20 (×3): qty 3

## 2021-07-20 MED ORDER — IPRATROPIUM-ALBUTEROL 0.5-2.5 (3) MG/3ML IN SOLN: 3.0000 mL | RESPIRATORY_TRACT | Status: DC | PRN

## 2021-07-20 MED ORDER — GABAPENTIN 300 MG PO CAPS
300.0000 mg | ORAL_CAPSULE | Freq: Every day | ORAL | Status: DC
  Administered 2021-07-20 – 2021-07-24 (×5): 300 mg via ORAL
  Filled 2021-07-20 (×5): qty 1

## 2021-07-20 MED ORDER — METOPROLOL TARTRATE 5 MG/5ML IV SOLN
5.0000 mg | INTRAVENOUS | Status: DC | PRN
Start: 2021-07-20 — End: 2021-07-21

## 2021-07-20 MED ORDER — SENNOSIDES-DOCUSATE SODIUM 8.6-50 MG PO TABS: 1.0000 | ORAL_TABLET | Freq: Every evening | ORAL | Status: DC | PRN

## 2021-07-20 MED ORDER — ENSURE ENLIVE PO LIQD
237.0000 mL | Freq: Two times a day (BID) | ORAL | Status: DC
  Administered 2021-07-20 – 2021-07-25 (×10): 237 mL via ORAL

## 2021-07-20 NOTE — Progress Notes (Signed)
?PROGRESS NOTE ? ? ? [name]  BWG:[id number] DOB: 07-Jun-1935 DOA: 07/19/2021 ?PCP: [name], MD  ? ?Brief Narrative:  ?86 year old with history of HTN, RA, COPD on 2 L nasal cannula at home comes to the hospital with shortness of breath ongoing for 5 days at home prior to admission.  She was found to be in COPD exacerbation.  Her procalcitonin levels were negative. ? ? ?Assessment & Plan: ? Active Problems: ?  Chronic respiratory failure ([place]) ?  COPD exacerbation ([place]) ?  Rheumatoid arthritis ([place]) ?  Hypokalemia ?  ? ?Acute on chronic hypoxic respiratory distress ?Acute COPD exacerbation ?- Patient chronically on 2 L nasal cannula at home.  She has significant amount of bilateral diminished breath sounds.  Continue Solu-Medrol IV, aggressive bronchodilators, I-S/flutter valve.  Procalcitonin negative but we will place her on empiric azithromycin.  Supplemental oxygen as necessary. ? ?Mediastinal lymphadenopathy ?- Suspect reactive but will need repeat CT scan in about 3 months. ? ?Seasonal allergies ?- We will add loratadine.  We can add Singulair if necessary ? ?Rheumatoid arthritis ?- Home Plaquenil on hold ? ?Hyperlipidemia ?- Statin ? ?Anxiety ?- Continue home medications ? ? ?DVT prophylaxis: Lovenox ?Code Status: Full code ?Family Communication:   ? ?Status is: Inpatient ?Remains inpatient appropriate because: Still significantly short of breath with very abnormal breath sounds.  Maintain hospital stay on IV steroids and aggressive bronchodilators ? ? ? ? ? ?Subjective: ?Feels slightly better compared to yesterday after receiving breathing treatment but still very much exertional dyspnea. ? ? ? ?Examination: ?Constitutional: Not in acute distress ?Respiratory: Bilateral diminished breath sounds ?Cardiovascular: Normal sinus rhythm, no rubs ?Abdomen: Nontender nondistended good bowel sounds ?Musculoskeletal: No edema noted ?Skin: No rashes seen ?Neurologic: CN 2-12 grossly intact.  And  nonfocal ?Psychiatric: Normal judgment and insight. Alert and oriented x 3. Normal mood. ? ?Objective: ?Vitals:  ? 07/19/21 2316 07/20/21 0112 07/20/21 0415 07/20/21 0730  ?BP: (!) 131/57  115/83   ?Pulse: (!) 110  (!) 102   ?Resp: 18  18   ?Temp: 98.3 ?F (36.8 ?C)  97.6 ?F (36.4 ?C)   ?TempSrc: Oral  Oral   ?SpO2: 96% 94% 97% 98%  ? ?No intake or output data in the 24 hours ending 07/20/21 1007 ?There were no vitals filed for this visit. ? ? ?Data Reviewed:  ? ?CBC: ?Recent Labs  ?Lab 07/19/21 ?1412 07/19/21 ?1818 07/20/21 ?0533  ?WBC 9.2 9.1 6.9  ?NEUTROABS 6.9  --   --   ?HGB 13.8 14.1 12.7  ?HCT 43.8 44.2 40.8  ?MCV 88.7 88.2 88.1  ?PLT 419* 433* 393  ? ?Basic Metabolic Panel: ?Recent Labs  ?Lab 07/19/21 ?1412 07/19/21 ?1818 07/20/21 ?0533  ?NA 139  --  135  ?K 3.1*  --  4.5  ?CL 106  --  103  ?CO2 28  --  24  ?GLUCOSE 126*  --  139*  ?BUN 12  --  20  ?CREATININE 0.78 0.87 0.73  ?CALCIUM 7.8*  --  8.9  ?MG  --  1.7  --   ? ?GFR: ?CrCl cannot be calculated (Unknown ideal weight.). ?Liver Function Tests: ?Recent Labs  ?Lab 07/19/21 ?1412 07/20/21 ?0533  ?AST 16 16  ?ALT 13 13  ?ALKPHOS 56 55  ?BILITOT 0.6 0.3  ?PROT 6.2* 6.5  ?ALBUMIN 3.2* 3.5  ? ?No results for input(s): LIPASE, AMYLASE in the last 168 hours. ?No results for input(s): AMMONIA in the last 168 hours. ?Coagulation Profile: ?No results  for input(s): INR, PROTIME in the last 168 hours. ?Cardiac Enzymes: ?No results for input(s): CKTOTAL, CKMB, CKMBINDEX, TROPONINI in the last 168 hours. ?BNP (last 3 results) ?No results for input(s): PROBNP in the last 8760 hours. ?HbA1C: ?No results for input(s): HGBA1C in the last 72 hours. ?CBG: ?No results for input(s): GLUCAP in the last 168 hours. ?Lipid Profile: ?No results for input(s): CHOL, HDL, LDLCALC, TRIG, CHOLHDL, LDLDIRECT in the last 72 hours. ?Thyroid Function Tests: ?No results for input(s): TSH, T4TOTAL, FREET4, T3FREE, THYROIDAB in the last 72 hours. ?Anemia Panel: ?No results for input(s):  VITAMINB12, FOLATE, FERRITIN, TIBC, IRON, RETICCTPCT in the last 72 hours. ?Sepsis Labs: ?Recent Labs  ?Lab 07/19/21 ?1818  ?PROCALCITON <0.10  ? ? ?Recent Results (from the past 240 hour(s))  ?Resp Panel by RT-PCR (Flu A&B, Covid) Nasopharyngeal Swab     Status: None  ? Collection Time: 07/19/21  2:13 PM  ? Specimen: Nasopharyngeal Swab; Nasopharyngeal(NP) swabs in vial transport medium  ?Result Value Ref Range Status  ? SARS Coronavirus 2 by RT PCR NEGATIVE NEGATIVE Final  ?  Comment: (NOTE) ?SARS-CoV-2 target nucleic acids are NOT DETECTED. ? ?The SARS-CoV-2 RNA is generally detectable in upper respiratory ?specimens during the acute phase of infection. The lowest ?concentration of SARS-CoV-2 viral copies this assay can detect is ?138 copies/mL. A negative result does not preclude SARS-Cov-2 ?infection and should not be used as the sole basis for treatment or ?other patient management decisions. A negative result may occur with  ?improper specimen collection/handling, submission of specimen other ?than nasopharyngeal swab, presence of viral mutation(s) within the ?areas targeted by this assay, and inadequate number of viral ?copies(<138 copies/mL). A negative result must be combined with ?clinical observations, patient history, and epidemiological ?information. The expected result is Negative. ? ?Fact Sheet for Patients:  ?EntrepreneurPulse.com.au ? ?Fact Sheet for Healthcare Providers:  ?IncredibleEmployment.be ? ?This test is no t yet approved or cleared by the [place] FDA and  ?has been authorized for detection and/or diagnosis of SARS-CoV-2 by ?FDA under an Emergency Use Authorization (EUA). This EUA will remain  ?in effect (meaning this test can be used) for the duration of the ?COVID-19 declaration under Section 564(b)(1) of the Act, 21 ?U.S.C.section 360bbb-3(b)(1), unless the authorization is terminated  ?or revoked sooner.  ? ? ?  ? Influenza A by PCR NEGATIVE  NEGATIVE Final  ? Influenza B by PCR NEGATIVE NEGATIVE Final  ?  Comment: (NOTE) ?The Xpert Xpress SARS-CoV-2/FLU/RSV plus assay is intended as an aid ?in the diagnosis of influenza from Nasopharyngeal swab specimens and ?should not be used as a sole basis for treatment. Nasal washings and ?aspirates are unacceptable for Xpert Xpress SARS-CoV-2/FLU/RSV ?testing. ? ?Fact Sheet for Patients: ?EntrepreneurPulse.com.au ? ?Fact Sheet for Healthcare Providers: ?IncredibleEmployment.be ? ?This test is not yet approved or cleared by the [place] FDA and ?has been authorized for detection and/or diagnosis of SARS-CoV-2 by ?FDA under an Emergency Use Authorization (EUA). This EUA will remain ?in effect (meaning this test can be used) for the duration of the ?COVID-19 declaration under Section 564(b)(1) of the Act, 21 U.S.C. ?section 360bbb-3(b)(1), unless the authorization is terminated or ?revoked. ? ?Performed at [facility], [address] ?[place], [place] [address] ?  ?Respiratory (~20 pathogens) panel by PCR     Status: None  ? Collection Time: 07/19/21  6:24 PM  ? Specimen: Nasopharyngeal Swab; Respiratory  ?Result Value Ref Range Status  ? Adenovirus NOT DETECTED NOT DETECTED Final  ?  Coronavirus 229E NOT DETECTED NOT DETECTED Final  ?  Comment: (NOTE) ?The Coronavirus on the Respiratory Panel, DOES NOT test for the novel  ?Coronavirus (2019 nCoV) ?  ? Coronavirus HKU1 NOT DETECTED NOT DETECTED Final  ? Coronavirus NL63 NOT DETECTED NOT DETECTED Final  ? Coronavirus OC43 NOT DETECTED NOT DETECTED Final  ? Metapneumovirus NOT DETECTED NOT DETECTED Final  ? Rhinovirus / Enterovirus NOT DETECTED NOT DETECTED Final  ? Influenza A NOT DETECTED NOT DETECTED Final  ? Influenza B NOT DETECTED NOT DETECTED Final  ? Parainfluenza Virus 1 NOT DETECTED NOT DETECTED Final  ? Parainfluenza Virus 2 NOT DETECTED NOT DETECTED Final  ? Parainfluenza Virus 3 NOT DETECTED NOT  DETECTED Final  ? Parainfluenza Virus 4 NOT DETECTED NOT DETECTED Final  ? Respiratory Syncytial Virus NOT DETECTED NOT DETECTED Final  ? Bordetella pertussis NOT DETECTED NOT DETECTED Final  ? Bordetella Parapertussis

## 2021-07-20 NOTE — Progress Notes (Signed)
Initial Nutrition Assessment ? ?DOCUMENTATION CODES:  ? ?Non-severe (moderate) malnutrition in context of chronic illness ? ?INTERVENTION:  ? ?-Ensure Plus High Protein po BID, each supplement provides 350 kcal and 20 grams of protein.  ? ?-Multivitamin with minerals daily ? ?-Weigh patient  (last recorded 05/11/21) ? ?NUTRITION DIAGNOSIS:  ? ?Moderate Malnutrition related to chronic illness (COPD) as evidenced by mild fat depletion, mild muscle depletion, energy intake < or equal to 75% for > or equal to 1 month. ? ?GOAL:  ? ?Patient will meet greater than or equal to 90% of their needs ? ?MONITOR:  ? ?PO intake, Supplement acceptance, Weight trends, I & O's, Labs ? ?REASON FOR ASSESSMENT:  ? ?Malnutrition Screening Tool ?  ? ?ASSESSMENT:  ? ?86 y.o. female with medical history significant of HLD, RA, COPD on home O2 (2L). Presenting with cough She reports that for the last 5 days she has had increased cough and weakness. ? ?Patient in room, sitting in chair. Pt consumed 100% of her breakfast of cereal with milk, banana and juice. Pt states she eats well at the hospital but at home she is not eating well d/t poor appetite. "I'm not a big eater".  ?Reports family members will cook for her sometimes.  ?Pt denies any issues with chewing or swallowing. Pt likes Ensure, strawberry flavor. Will order.  ? ?Per weight records, last recorded weight from January 2023. Will order for weight to be obtained for this admission. ? ?Medications: Folic acid, Multivitamin with minerals daily, KLOR-CON ? ?Labs reviewed: ? CBGs: 137 ? ?NUTRITION - FOCUSED PHYSICAL EXAM: ? ?Flowsheet Row Most Recent Value  ?Orbital Region Mild depletion  ?Upper Arm Region Mild depletion  ?Thoracic and Lumbar Region No depletion  ?Buccal Region Mild depletion  ?Temple Region Mild depletion  ?Clavicle Bone Region No depletion  ?Clavicle and Acromion Bone Region No depletion  ?Scapular Bone Region No depletion  ?Dorsal Hand Moderate depletion  ?Patellar  Region Unable to assess  ?Anterior Thigh Region Unable to assess  ?Posterior Calf Region Unable to assess  ?Edema (RD Assessment) None  ?Hair Reviewed  ?Eyes Reviewed  ?Mouth Reviewed  ?Skin Reviewed  ?Nails Unable to assess  [fake nails]  ? ?  ? ? ?Diet Order:   ?Diet Order   ? ?       ?  Diet Heart Room service appropriate? Yes; Fluid consistency: Thin  Diet effective now       ?  ? ?  ?  ? ?  ? ? ?EDUCATION NEEDS:  ? ?No education needs have been identified at this time ? ?Skin:  Skin Assessment: Reviewed RN Assessment ? ?Last BM:  3/28 ? ?Height:  ? ?Ht Readings from Last 1 Encounters:  ?05/11/21 5' (1.524 m)  ? ? ?Weight:  ? ?Wt Readings from Last 1 Encounters:  ?05/11/21 77.1 kg  ? ? ?BMI:  There is no height or weight on file to calculate BMI. ? ?Estimated Nutritional Needs:  ? ?Kcal:  1400-1600 ? ?Protein:  65-80g ? ?Fluid:  1.6L/day ? ? ?Clayton Bibles, MS, RD, LDN ?Inpatient Clinical Dietitian ?Contact information available via Amion ? ?

## 2021-07-21 DIAGNOSIS — R739 Hyperglycemia, unspecified: Secondary | ICD-10-CM | POA: Diagnosis not present

## 2021-07-21 DIAGNOSIS — E440 Moderate protein-calorie malnutrition: Secondary | ICD-10-CM

## 2021-07-21 DIAGNOSIS — T380X5A Adverse effect of glucocorticoids and synthetic analogues, initial encounter: Secondary | ICD-10-CM

## 2021-07-21 DIAGNOSIS — E876 Hypokalemia: Secondary | ICD-10-CM | POA: Diagnosis not present

## 2021-07-21 DIAGNOSIS — M069 Rheumatoid arthritis, unspecified: Secondary | ICD-10-CM | POA: Diagnosis not present

## 2021-07-21 DIAGNOSIS — E785 Hyperlipidemia, unspecified: Secondary | ICD-10-CM | POA: Diagnosis present

## 2021-07-21 DIAGNOSIS — J441 Chronic obstructive pulmonary disease with (acute) exacerbation: Secondary | ICD-10-CM | POA: Diagnosis not present

## 2021-07-21 LAB — BASIC METABOLIC PANEL
Anion gap: 6 (ref 5–15)
BUN: 21 mg/dL (ref 8–23)
CO2: 26 mmol/L (ref 22–32)
Calcium: 8.7 mg/dL — ABNORMAL LOW (ref 8.9–10.3)
Chloride: 102 mmol/L (ref 98–111)
Creatinine, Ser: 0.87 mg/dL (ref 0.44–1.00)
GFR, Estimated: >60 mL/min (ref >60)
Glucose, Bld: 148 mg/dL — ABNORMAL HIGH (ref 70–99)
Potassium: 5.4 mmol/L — ABNORMAL HIGH (ref 3.5–5.1)
Sodium: 134 mmol/L — ABNORMAL LOW (ref 135–145)

## 2021-07-21 LAB — GLUCOSE, CAPILLARY
Glucose-Capillary: 131 mg/dL — ABNORMAL HIGH (ref 70–99)
Glucose-Capillary: 173 mg/dL — ABNORMAL HIGH (ref 70–99)
Glucose-Capillary: 135 mg/dL — ABNORMAL HIGH (ref 70–99)
Glucose-Capillary: 127 mg/dL — ABNORMAL HIGH (ref 70–99)

## 2021-07-21 LAB — MAGNESIUM: Magnesium: 1.8 mg/dL (ref 1.7–2.4)

## 2021-07-21 MED ORDER — IPRATROPIUM-ALBUTEROL 0.5-2.5 (3) MG/3ML IN SOLN: 3.0000 mL | RESPIRATORY_TRACT | Status: DC | PRN

## 2021-07-21 MED ORDER — IPRATROPIUM-ALBUTEROL 0.5-2.5 (3) MG/3ML IN SOLN
3.0000 mL | Freq: Four times a day (QID) | RESPIRATORY_TRACT | Status: DC
  Administered 2021-07-21 – 2021-07-22 (×4): 3 mL via RESPIRATORY_TRACT
  Filled 2021-07-21 (×4): qty 3

## 2021-07-21 MED ORDER — MOMETASONE FURO-FORMOTEROL FUM 200-5 MCG/ACT IN AERO
2.0000 | INHALATION_SPRAY | Freq: Two times a day (BID) | RESPIRATORY_TRACT | Status: DC
  Administered 2021-07-22 – 2021-07-25 (×7): 2 via RESPIRATORY_TRACT
  Filled 2021-07-21: qty 8.8

## 2021-07-21 MED ORDER — ENOXAPARIN SODIUM 40 MG/0.4ML IJ SOSY: 40.0000 mg | PREFILLED_SYRINGE | INTRAMUSCULAR | Status: DC

## 2021-07-21 MED ORDER — METHYLPREDNISOLONE SODIUM SUCC 40 MG IJ SOLR
40.0000 mg | Freq: Two times a day (BID) | INTRAMUSCULAR | Status: DC
  Administered 2021-07-21 – 2021-07-23 (×4): 40 mg via INTRAVENOUS
  Filled 2021-07-21 (×4): qty 1

## 2021-07-21 NOTE — Assessment & Plan Note (Addendum)
Acute on chronic hypoxemic respiratory failure.  ?Oxymetry is 95% on 3 L/min per [place] ? ?Slowly improving but not yet back to her baseline.  ? ?Continue with aggressive bronchodilator therapy with duoneb q 6 hrs and as needed q 2 hrs. ?Decrease methylprednisolone to 40 mg daily. ?Azithromycin for 5 days for airway inflammation.  ?Continue with antitussive agents and airway clearing techniques with flutter valve and incentive spirometer.  ?Out of bed to chair tid with meals ?PT and OT.  ? ?

## 2021-07-21 NOTE — Assessment & Plan Note (Signed)
Continue with nutritional supplements.  

## 2021-07-21 NOTE — Assessment & Plan Note (Addendum)
Continue tapering steroids. ?Fasting glucose today is 132. ?Continue glucose cover and monitoring with insulin sliding scale.  ?

## 2021-07-21 NOTE — Assessment & Plan Note (Signed)
Stable, positive deformities in bilateral hands.  ?

## 2021-07-21 NOTE — Progress Notes (Signed)
?  Progress Note ? ? ?Patient: [name] TMA:[id number] DOB: July 16, 1935 DOA: 07/19/2021     2 ?DOS: the patient was seen and examined on 07/21/2021 ?  ?Brief hospital course: ?Mrs. Anglin was admitted to the hospital with the working diagnosis of COPD exacerbation.  ? ?86 yo female with the past medical history of COPD, RA, dyslipidemia, who presented with cough. Reported 5 days of worsening dyspnea and weakness, refractive to inhalers at home. On her initial physical examination her blood pressure was 123/64, HR 96, RR 17 and 02 saturation 88%, lungs with decreased breath sounds bilaterally, positive wheezing and increased work or breathing, heart with S1 and S2 present and rhythmic, abdomen soft and no lower extremity edema.  ? ?Na 139, K 3,1 Cl 106, bicarbonate 28 glucose 128 bun 12 cr 0,78 ?Wbc 9,2 hgb 13,8 hct 43,8 plt 419  ?Sars covid 19 negative  ? ?Chest radiograph with hyperinflation, no infiltrates.  ? ?EKG 95 bpm, normal axis and normal intervals, sinus rhythm with poor R wave progression, no significant ST segment or T wave changes. ? ?Patient was placed on systemic steroids and aggressive bronchodilator therapy. ? ? ? ? ?Assessment and Plan: ?* COPD exacerbation ([place]) ?Acute on chronic hypoxemic respiratory failure.  ?Oxymetry is 95% on 3 L/min per [place] ?Patient continue to have poor ventilation on lung examination.  ? ?Plan to continue with aggressive bronchodilator therapy with duoneb q 6 hrs and as needed q 2 hrs. ?Continue with methylprednisolone 40 mg IV q12 and inhaled steroids. ?Azithromycin for 5 days for airway inflammation.  ?Antitussive agents and airway clearing techniques with flutter valve and incentive spirometer.  ?Out of bed to chair tid with meals ?PT and OT.  ? ? ?Hypokalemia ?Hyponatremia.  ?Renal function stable with serum cr at 0,87, K is 5,4 and serum bicarbonate at 26. ?Continue close follow up on renal function and electrolytes, avoid hypotension or nephrotoxic medications.   ? ?Rheumatoid arthritis ([place]) ?Stable, positive deformities in bilateral hands.  ? ?Malnutrition of moderate degree ?Continue with nutritional supplements.  ? ?Steroid-induced hyperglycemia ?Continue with insulin sliding scale for glucose cover and monitoring. ?Taper steroids.  ? ?Dyslipidemia ?Continue with statin therapy.  ? ? ? ? ?  ? ?Subjective: patient with mild improvement in dyspnea but not back to baseline  ? ?Physical Exam: ?Vitals:  ? 07/20/21 2115 07/21/21 0427 07/21/21 0927 07/21/21 0942  ?BP: (!) 139/57 137/81    ?Pulse: 83 95    ?Resp: '18 18 20   '$ ?Temp: 98.2 ?F (36.8 ?C) 98.1 ?F (36.7 ?C)    ?TempSrc: Oral Oral    ?SpO2: 93% 95% 95%   ?Weight:    85.7 kg  ? ?Neurology awake and alert ?ENT with no pallor ?Cardiovascular with S1 and S2 present and rhythmic, with no gallops or murmurs ?Respiratory with scattered rales but no wheezing, decreased air movement and prolonged expiratory phase. ?Abdomen not distended ?No lower extremity edema  ?Data Reviewed: ? ? ? ?Family Communication: no family at the bedside  ? ?Disposition: ?Status is: Inpatient ?Remains inpatient appropriate because: COPD exacerbation  ? Planned Discharge Destination: Home ? ? ? ?Author: ?Tawni Millers, MD ?07/21/2021 2:07 PM ? ?For on call review [email or web address].  ?

## 2021-07-21 NOTE — Assessment & Plan Note (Signed)
Continue with statin therapy.  ?

## 2021-07-21 NOTE — Hospital Course (Addendum)
86 yo female with past medical history of COPD, RA, dyslipidemia  presented to the hospital with cough, worsening dyspnea and weakness, refractive to inhalers at home. On her initial physical examination, oxygen saturation 88%, lungs with decreased breath sounds bilaterally, positive wheezing and increased work or breathing. Initial potassium was 3.0.  Covid 19 was negative. Chest radiograph with hyperinflation, no infiltrates.  EKG was unremarkable.  Patient was started on steroids and bronchodilators and was admitted to the hospital. ? ?

## 2021-07-21 NOTE — Assessment & Plan Note (Addendum)
Hyponatremia, hypokalemia/ hyperkalemia. ? ?Electrolytes have been corrected, with K at 4.8 and serum bicarbonate at 32, with Na 135. Renal function with serum cr at 0,80.  ? ?Patient tolerating po well.  ?

## 2021-07-22 DIAGNOSIS — R739 Hyperglycemia, unspecified: Secondary | ICD-10-CM | POA: Diagnosis not present

## 2021-07-22 DIAGNOSIS — M069 Rheumatoid arthritis, unspecified: Secondary | ICD-10-CM | POA: Diagnosis not present

## 2021-07-22 DIAGNOSIS — J441 Chronic obstructive pulmonary disease with (acute) exacerbation: Secondary | ICD-10-CM | POA: Diagnosis not present

## 2021-07-22 DIAGNOSIS — E876 Hypokalemia: Secondary | ICD-10-CM | POA: Diagnosis not present

## 2021-07-22 LAB — BASIC METABOLIC PANEL
Anion gap: 5 (ref 5–15)
BUN: 25 mg/dL — ABNORMAL HIGH (ref 8–23)
CO2: 31 mmol/L (ref 22–32)
Calcium: 8.9 mg/dL (ref 8.9–10.3)
Chloride: 100 mmol/L (ref 98–111)
Creatinine, Ser: 0.86 mg/dL (ref 0.44–1.00)
GFR, Estimated: >60 mL/min (ref >60)
Glucose, Bld: 154 mg/dL — ABNORMAL HIGH (ref 70–99)
Potassium: 5.5 mmol/L — ABNORMAL HIGH (ref 3.5–5.1)
Sodium: 136 mmol/L (ref 135–145)

## 2021-07-22 LAB — GLUCOSE, CAPILLARY
Glucose-Capillary: 141 mg/dL — ABNORMAL HIGH (ref 70–99)
Glucose-Capillary: 191 mg/dL — ABNORMAL HIGH (ref 70–99)
Glucose-Capillary: 216 mg/dL — ABNORMAL HIGH (ref 70–99)
Glucose-Capillary: 125 mg/dL — ABNORMAL HIGH (ref 70–99)

## 2021-07-22 MED ORDER — IPRATROPIUM-ALBUTEROL 0.5-2.5 (3) MG/3ML IN SOLN
3.0000 mL | Freq: Three times a day (TID) | RESPIRATORY_TRACT | Status: DC
  Administered 2021-07-23 – 2021-07-25 (×7): 3 mL via RESPIRATORY_TRACT
  Filled 2021-07-22 (×7): qty 3

## 2021-07-22 MED ORDER — SODIUM ZIRCONIUM CYCLOSILICATE 10 G PO PACK
10.0000 g | PACK | Freq: Once | ORAL | Status: AC
  Administered 2021-07-22: 10 g via ORAL
  Filled 2021-07-22: qty 1

## 2021-07-22 NOTE — TOC Initial Note (Signed)
Transition of Care (TOC) - Initial/Assessment Note  ? ? ?Patient Details  ?Name: [name] ?MRN: [id number] ?Date of Birth: 1936-01-19 ? ?Transition of Care (TOC) CM/SW Contact:    ?Tawanna Cooler, RN ?Phone Number: ?07/22/2021, 1:57 PM ? ?Clinical Narrative:                 ? ?Per notes, patient has home O2, but was not wearing it on arrival to hospital.  [facility] CM following for discharge needs.  ? ?Expected Discharge Plan: [place] ?Barriers to Discharge: Continued Medical Work up ? ? ?Expected Discharge Plan and Services ?Expected Discharge Plan: [place] ?  ?  ?  ?Living arrangements for the past 2 months: Single Family Home ?                ?  ? ?Prior Living Arrangements/Services ?Living arrangements for the past 2 months: [place] ?Lives with:: Relatives ?Patient language and need for interpreter reviewed:: Yes ?       ?Need for Family Participation in Patient Care: Yes (Comment) ?Care giver support system in place?: Yes (comment) ?  ?Criminal Activity/Legal Involvement Pertinent to Current Situation/Hospitalization: No - Comment as needed ? ?Activities of Daily Living ?Home Assistive Devices/Equipment: Oxygen, Walker (specify type) ?ADL Screening (condition at time of admission) ?Patient's cognitive ability adequate to safely complete daily activities?: Yes ?Is the patient deaf or have difficulty hearing?: Yes ?Does the patient have difficulty seeing, even when wearing glasses/contacts?: Yes (blurry vision - has had for 3 weeks) ?Does the patient have difficulty concentrating, remembering, or making decisions?: Yes (some memory problems) ?Patient able to express need for assistance with ADLs?: Yes ?Does the patient have difficulty dressing or bathing?: Yes ?Independently performs ADLs?: Yes (appropriate for developmental age) ?Does the patient have difficulty walking or climbing stairs?: Yes ?Weakness of Legs: Both ?Weakness of Arms/Hands: Both ? ?  ?Alcohol  / Substance Use: Not Applicable ?Psych Involvement: No (comment) ? ?Admission diagnosis:  COPD exacerbation ([place]) [J44.1] ?Patient Active Problem List  ? Diagnosis Date Noted  ? Malnutrition of moderate degree 07/21/2021  ? Steroid-induced hyperglycemia 07/21/2021  ? Dyslipidemia 07/21/2021  ? Hypokalemia 07/19/2021  ? CAP (community acquired pneumonia) 05/11/2021  ? Diarrhea 07/16/2019  ? AKI (acute kidney injury) ([place]) 07/16/2019  ? Falls frequently 07/16/2019  ? COVID-19 virus infection 05/16/2019  ? Community acquired pneumonia   ? Acute on chronic respiratory failure with hypoxia ([place]) 06/08/2018  ? Acute respiratory failure with hypercapnia ([facility])   ? COPD exacerbation ([place]) 07/29/2017  ? Rheumatoid arthritis ([place]) 07/29/2017  ? Neuropathy 07/29/2017  ? Acute respiratory failure with hypoxia ([place]) 07/29/2017  ? Anemia 05/20/2016  ? Hyperglycemia 05/20/2016  ? Shoulder impingement syndrome 03/25/2015  ? Constipation 07/03/2011  ? COPD with acute exacerbation ([place]) 06/29/2011  ? Stage 3 severe COPD by GOLD classification ([place]) 06/19/2007  ? HLD (hyperlipidemia) 05/15/2007  ? ACID REFLUX DISEASE 05/15/2007  ? ?PCP:  [name], MD ?Pharmacy:   ?[id number] - [place], [place] - [address] ?[address] ?[place] [address] ?Phone: [phone] Fax: [phone] ? ? ? ?

## 2021-07-22 NOTE — Progress Notes (Signed)
Telemetry called to report a 5 beat run of Vtach... pt is asymptomatic, on phone talking to family. Vitals had just been taken. MD notified. No further orders at this time.  ?

## 2021-07-22 NOTE — Progress Notes (Signed)
?  Progress Note ? ? ?Patient: [name] DOB: 06/11/1935 DOA: 07/19/2021     3 ?DOS: the patient was seen and examined on 07/22/2021 ?  ?Brief hospital course: ?[name] was admitted to the hospital with the working diagnosis of COPD exacerbation.  ? ?86 yo female with the past medical history of COPD, RA, dyslipidemia, who presented with cough. Reported 5 days of worsening dyspnea and weakness, refractive to inhalers at home. On her initial physical examination her blood pressure was 123/64, HR 96, RR 17 and 02 saturation 88%, lungs with decreased breath sounds bilaterally, positive wheezing and increased work or breathing, heart with S1 and S2 present and rhythmic, abdomen soft and no lower extremity edema.  ? ?Na 139, K 3,1 Cl 106, bicarbonate 28 glucose 128 bun 12 cr 0,78 ?Wbc 9,2 hgb 13,8 hct 43,8 plt 419  ?Sars covid 19 negative  ? ?Chest radiograph with hyperinflation, no infiltrates.  ? ?EKG 95 bpm, normal axis and normal intervals, sinus rhythm with poor R wave progression, no significant ST segment or T wave changes. ? ?Patient was placed on systemic steroids and aggressive bronchodilator therapy. ?Slowly improving her symptoms.  ? ?Assessment and Plan: ?* COPD exacerbation ([place]) ?Acute on chronic hypoxemic respiratory failure.  ?Oxymetry is 95% on 3 L/min per [place] ? ?Improved lung examination with better air movement, continue with mild wheezing and prolonged expiratory phase. ? ?On aggressive bronchodilator therapy with duoneb q 6 hrs and as needed q 2 hrs. ?On methylprednisolone 40 mg IV q12 and inhaled steroids. ?Continue with Azithromycin for 5 days for airway inflammation.  ?Antitussive agents and airway clearing techniques with flutter valve and incentive spirometer.  ?Out of bed to chair tid with meals ?PT and OT.  ? ? ?Hypokalemia ?Hyponatremia, hypokalemia/ hyperkalemia. ? ?Renal function with serum cr at 0,86, K is 5,5 and serum bicarbonate at 31.  Na 136. ? ?Plan to add sodium  zirconium 10 mg x 1 dose and follow up renal function and electrolytes in am.  ?Avoid hypotension and nephrotoxic medications.  ? ? ?Rheumatoid arthritis ([place]) ?Stable, positive deformities in bilateral hands.  ? ?Malnutrition of moderate degree ?Continue with nutritional supplements.  ? ?Steroid-induced hyperglycemia ?Continue with insulin sliding scale for glucose cover and monitoring. ?Taper steroids.  ?Fasting glucose this am 154 mg/dl ? ? ?Dyslipidemia ?Continue with statin therapy.  ? ? ? ? ?  ? ?Subjective: patient is feeling better but not yet back to her baseline, continue to have dyspnea.  ? ?Physical Exam: ?Vitals:  ? 07/21/21 2128 07/22/21 0607 07/22/21 0757 07/22/21 0800  ?BP:  127/67  124/72  ?Pulse:  92  98  ?Resp:    20  ?Temp:  98 ?F (36.7 ?C)  97.8 ?F (36.6 ?C)  ?TempSrc:  Oral  Oral  ?SpO2: 95% 99% 94% 95%  ?Weight:      ? ?Neurology awake and alert ?ENT with mild pallor ?Cardiovascular with S1 and S2 present and rhythmic with no gallops or murmurs ?Respiratory with prolonged expiratory phase, bilateral rales and faint expiratory wheezing ?Abdomen soft  ?No lower extremity edema  ?Data Reviewed: ? ? ? ?Family Communication: no family at the bedside  ? ?Disposition: ?Status is: Inpatient ?Remains inpatient appropriate because: COPD exacerbation, possible dc in 48 hours.  ? Planned Discharge Destination: Home ? ? ? ?Author: ?Tawni Millers, MD ?07/22/2021 2:23 PM ? ?For on call review [email or web address].  ?

## 2021-07-22 NOTE — Evaluation (Signed)
Physical Therapy Evaluation ?Patient Details ?Name: [name] ?MRN: [id number] ?DOB: 1935/05/03 ?Today's Date: 07/22/2021 ? ?History of Present Illness ? Pt. is a 86 y.o. F presenting to [facility] on 3/29 with a cough and increasing weakness over the past 5 days. She was found to be in COPD exacerberation. PMH significant HLD, RA, COPD on home O2 (2L).  ?Clinical Impression ?  ?Pt presents with generalized weakness, chronic R knee pain, impaired understanding and application of proper breathing technique, impaired balance, and decreased activity tolerance vs baseline. Pt to benefit from acute PT to address deficits. Pt ambulated hallway distance with use of RW and intermittent standing rest breaks, on 3LO2 via [place] maintaining SpO2 90% and greater. PT recommending HHPT for energy conservation techniques/home set up, strengthening, and balance intervention as well as support of family. PT to progress mobility as tolerated, and will continue to follow acutely.  ?   ?   ? ?Recommendations for follow up therapy are one component of a multi-disciplinary discharge planning process, led by the attending physician.  Recommendations may be updated based on patient status, additional functional criteria and insurance authorization. ? ?Follow Up Recommendations Home health PT ? ?  ?Assistance Recommended at Discharge Set up Supervision/Assistance  ?Patient can return home with the following ? A little help with walking and/or transfers;A little help with bathing/dressing/bathroom ? ?  ?Equipment Recommendations None recommended by PT  ?Recommendations for Other Services ?    ?  ?Functional Status Assessment Patient has had a recent decline in their functional status and demonstrates the ability to make significant improvements in function in a reasonable and predictable amount of time.  ? ?  ?Precautions / Restrictions Precautions ?Precautions: Fall ?Precaution Comments: monitor HR and O2 ?Restrictions ?Weight Bearing Restrictions:  No  ? ?  ? ?Mobility ? Bed Mobility ?Overal bed mobility: Needs Assistance ?Bed Mobility: Supine to Sit ?  ?  ?Supine to sit: Supervision, HOB elevated ?  ?  ?General bed mobility comments: for safety, increased time and use of bedrails to perform ?  ? ?Transfers ?Overall transfer level: Needs assistance ?Equipment used: None ?Transfers: Sit to/from Stand ?Sit to Stand: Supervision ?  ?  ?  ?  ?  ?General transfer comment: for safety, unsafe hand placement when rising/sitting ?  ? ?Ambulation/Gait ?Ambulation/Gait assistance: Supervision ?Gait Distance (Feet): 250 Feet ?Assistive device: Rolling walker (2 wheels) ?Gait Pattern/deviations: Step-through pattern, Decreased stride length, Trunk flexed ?Gait velocity: decr ?  ?  ?General Gait Details: cues for upright posture, pursed lip breathing technique. SPO2 90% and greater on 3LO2, HRmax during mobility 135 bpm ? ?Stairs ?  ?  ?  ?  ?  ? ?Wheelchair Mobility ?  ? ?Modified Rankin (Stroke Patients Only) ?  ? ?  ? ?Balance Overall balance assessment: Needs assistance ?Sitting-balance support: No upper extremity supported, Feet supported ?Sitting balance-Leahy Scale: Good ?  ?  ?Standing balance support: Bilateral upper extremity supported, During functional activity ?Standing balance-Leahy Scale: Poor ?Standing balance comment: reliant on external support ?  ?  ?  ?  ?  ?  ?  ?  ?  ?  ?  ?   ? ? ? ?Pertinent Vitals/Pain Pain Assessment ?Pain Assessment: Faces ?Faces Pain Scale: Hurts little more ?Pain Location: R knee ?Pain Descriptors / Indicators: Sore, Discomfort ?Pain Intervention(s): Limited activity within patient's tolerance, Monitored during session, Repositioned  ? ? ?Home Living Family/patient expects to be discharged to:: Private residence ?Living Arrangements: Other relatives (Granddaughter  and son-in-law) ?Available Help at Discharge: Family;Available PRN/intermittently ?Type of Home: House ?Home Access: Stairs to enter ?Entrance Stairs-Rails:  None ?Entrance Stairs-Number of Steps: 1 ?Alternate Level Stairs-Number of Steps: pt has chair lift to upstairs ?Home Layout: Two level;Bed/bath upstairs ?Home Equipment: Rollator (4 wheels);Shower seat;Electric scooter;BSC/3in1 ?Additional Comments: has lift chair, chair lift on stairs; lives with 5 year old granddaughter and son in law who is not able to physically assist  ?  ?Prior Function Prior Level of Function : Driving;Independent/Modified Independent ?  ?  ?  ?  ?  ?  ?Mobility Comments: rollator in the home, scooter for community distances, cane for front door to car ?ADLs Comments: pt generally MOD I in ADLs, intermittent assist when needed ?  ? ? ?Hand Dominance  ? Dominant Hand: Right ? ?  ?Extremity/Trunk Assessment  ? Upper Extremity Assessment ?Upper Extremity Assessment: Defer to OT evaluation ?  ? ?Lower Extremity Assessment ?Lower Extremity Assessment: Generalized weakness (3+/5 throughout, limited by bilat knee R>L discomfort) ?  ? ?Cervical / Trunk Assessment ?Cervical / Trunk Assessment: Normal  ?Communication  ? Communication: HOH  ?Cognition Arousal/Alertness: Awake/alert ?Behavior During Therapy: [facility] for tasks assessed/performed ?Overall Cognitive Status: No family/caregiver present to determine baseline cognitive functioning ?  ?  ?  ?  ?  ?  ?  ?  ?  ?  ?  ?  ?  ?  ?  ?  ?General Comments: min repeated cuing needed during session to follow commands, has a good understanding of deficits but at times demonstrates safety issues (for example improper hand placement when rising/sitting with RW) ?  ?  ? ?  ?General Comments General comments (skin integrity, edema, etc.): 3LO2 via [place], SpO2 90% and greater ? ?  ?Exercises    ? ?Assessment/Plan  ?  ?PT Assessment Patient needs continued PT services  ?PT Problem List Decreased strength;Decreased mobility;Decreased activity tolerance;Decreased knowledge of precautions;Decreased balance;Decreased knowledge of use of DME;Pain ? ?   ?  ?PT Treatment  Interventions DME instruction;Therapeutic activities;Gait training;Therapeutic exercise;Patient/family education;Balance training;Stair training;Functional mobility training;Neuromuscular re-education   ? ?PT Goals (Current goals can be found in the Care Plan section)  ?Acute Rehab PT Goals ?Patient Stated Goal: home, breathe better ?PT Goal Formulation: With patient ?Time For Goal Achievement: 08/05/21 ?Potential to Achieve Goals: Good ? ?  ?Frequency Min 3X/week ?  ? ? ?Co-evaluation   ?  ?  ?  ?  ? ? ?  ?AM-PAC PT "6 Clicks" Mobility  ?Outcome Measure Help needed turning from your back to your side while in a flat bed without using bedrails?: A Little ?Help needed moving from lying on your back to sitting on the side of a flat bed without using bedrails?: A Little ?Help needed moving to and from a bed to a chair (including a wheelchair)?: A Little ?Help needed standing up from a chair using your arms (e.g., wheelchair or bedside chair)?: A Little ?Help needed to walk in hospital room?: A Little ?Help needed climbing 3-5 steps with a railing? : A Little ?6 Click Score: 18 ? ?  ?End of Session Equipment Utilized During Treatment: Oxygen ?Activity Tolerance: Patient tolerated treatment well ?Patient left: in bed;with call bell/phone within reach;Other (comment) (sitting EOB, OT arrived to room) ?Nurse Communication: Mobility status ?PT Visit Diagnosis: Other abnormalities of gait and mobility (R26.89);Muscle weakness (generalized) (M62.81) ?  ? ?Time: 2094-7096 ?PT Time Calculation (min) (ACUTE ONLY): 21 min ? ? ?Charges:   PT Evaluation ?$PT  Eval Low Complexity: 1 Low ?  ?  ?   ? ?Stacie Glaze, PT DPT ?Acute Rehabilitation Services ?Pager [phone]  ?Office [phone] ? ? ?[name] ?07/22/2021, 11:35 AM ? ?

## 2021-07-22 NOTE — Evaluation (Signed)
Occupational Therapy Evaluation ?Patient Details ?Name: [name] ?MRN: [id number] ?DOB: October 05, 1935 ?Today's Date: 07/22/2021 ? ? ?History of Present Illness Pt. is a 86 y.o. F presenting to [facility] on 3/29 with a cough and increasing weakness over the past 5 days. She was found to be in COPD exacerberation. PMH significant HLD, RA, COPD on home O2 (2L).  ? ?Clinical Impression ?  ?Patient is a 86 year old female who was admitted for above. Patient as noted to have a drop in O2 saturation after toileting with O2 dropping to 85% on 3L/min with Hr noted to be 135 bpm. Patient was able to deep breath O2 saturation back up to 92% on 3L/min within 30 seconds with HR decreased to 100bpm. Patient was noted to have decreased functional activity tolerance, decreased cardiopulmomary endurance, and decreased safety awareness impacting participation in ADLs. Patient would continue to benefit from skilled OT services at this time while admitted and after d/c to address noted deficits in order to improve overall safety and independence in ADLs.  ? ?   ? ?Recommendations for follow up therapy are one component of a multi-disciplinary discharge planning process, led by the attending physician.  Recommendations may be updated based on patient status, additional functional criteria and insurance authorization.  ? ?Follow Up Recommendations ? Home health OT  ?  ?Assistance Recommended at Discharge Frequent or constant Supervision/Assistance  ?Patient can return home with the following A little help with walking and/or transfers;Direct supervision/assist for medications management;Direct supervision/assist for financial management;Help with stairs or ramp for entrance;A little help with bathing/dressing/bathroom ? ?  ?Functional Status Assessment ? Patient has had a recent decline in their functional status and demonstrates the ability to make significant improvements in function in a reasonable and predictable amount of time.   ?Equipment Recommendations ? None recommended by OT  ?  ?Recommendations for Other Services   ? ? ?  ?Precautions / Restrictions Precautions ?Precautions: Fall ?Precaution Comments: monitor HR and O2 ?Restrictions ?Weight Bearing Restrictions: No  ? ?  ? ?Mobility Bed Mobility ?  ?  ?  ?  ?  ?  ?  ?General bed mobility comments: patient was finishing up a walk with PT at start of session ?  ? ?Transfers ?  ?  ?  ?  ?  ?  ?  ?  ?  ?  ?  ? ?  ?Balance Overall balance assessment: Needs assistance ?Sitting-balance support: No upper extremity supported, Feet supported ?Sitting balance-Leahy Scale: Good ?  ?  ?Standing balance support: During functional activity, Single extremity supported ?Standing balance-Leahy Scale: Poor ?Standing balance comment: reliant on external support ?  ?  ?  ?  ?  ?  ?  ?  ?  ?  ?  ?   ? ?ADL either performed or assessed with clinical judgement  ? ?ADL Overall ADL's : Needs assistance/impaired ?Eating/Feeding: Set up;Sitting ?  ?Grooming: Dance movement psychotherapist;Set up;Sitting ?  ?Upper Body Bathing: Set up;Sitting ?  ?Lower Body Bathing: Minimal assistance;Sit to/from stand;Sitting/lateral leans ?  ?Upper Body Dressing : Set up;Sitting ?  ?Lower Body Dressing: Min guard;Sit to/from stand;Sitting/lateral leans ?  ?Toilet Transfer: Min guard;Rolling walker (2 wheels);Regular Toilet;Ambulation ?Toilet Transfer Details (indicate cue type and reason): with RW with patient noted to get tangled in O2 cord each transfer despite education on turning away from wire. HR noted to increase to 130s brefily during mobility. ?  ?  ?  ?  ?  ?   ? ? ? ?  Vision Patient Visual Report: No change from baseline ?   ?   ?Perception   ?  ?Praxis   ?  ? ?Pertinent Vitals/Pain Pain Assessment ?Pain Assessment: No/denies pain ?Faces Pain Scale: Hurts little more ?Pain Location: R knee ?Pain Descriptors / Indicators: Sore, Discomfort ?Pain Intervention(s): Limited activity within patient's tolerance, Monitored during session   ? ? ? ?Hand Dominance Right ?  ?Extremity/Trunk Assessment Upper Extremity Assessment ?Upper Extremity Assessment: Defer to OT evaluation ?  ?Lower Extremity Assessment ?Lower Extremity Assessment: Generalized weakness (3+/5 throughout, limited by bilat knee R>L discomfort) ?  ?Cervical / Trunk Assessment ?Cervical / Trunk Assessment: Normal ?  ?Communication Communication ?Communication: HOH ?  ?Cognition Arousal/Alertness: Awake/alert ?Behavior During Therapy: [facility] for tasks assessed/performed ?Overall Cognitive Status: Within Functional Limits for tasks assessed ?  ?  ?  ?  ?  ?  ?  ?  ?  ?  ?  ?  ?  ?  ?  ?  ?General Comments: noted to have some short term memory deficits and safety awareness issues during session. patient was tearful for a few moments reguarding life change of getting old and potentially needing more assistance. nurse made aware. ?  ?  ?General Comments  3LO2 via [place], SpO2 90% and greater ? ?  ?Exercises   ?  ?Shoulder Instructions    ? ? ?Home Living Family/patient expects to be discharged to:: Private residence ?Living Arrangements: Other relatives (Granddaughter and son-in-law) ?Available Help at Discharge: Family;Available PRN/intermittently ?Type of Home: House ?Home Access: Stairs to enter ?Entrance Stairs-Number of Steps: 1 ?Entrance Stairs-Rails: None ?Home Layout: Two level;Bed/bath upstairs ?Alternate Level Stairs-Number of Steps: pt has chair lift to upstairs ?  ?Bathroom Shower/Tub: Walk-in shower ?  ?Bathroom Toilet: Handicapped height ?Bathroom Accessibility: Yes ?  ?Home Equipment: Rollator (4 wheels);Shower seat;Electric scooter;BSC/3in1 ?  ?Additional Comments: has lift chair, chair lift on stairs; lives with 16 year old granddaughter and son in law who is not able to physically assist ?  ? ?  ?Prior Functioning/Environment Prior Level of Function : Driving;Independent/Modified Independent ?  ?  ?  ?  ?  ?  ?Mobility Comments: rollator in the home, scooter for community  distances, cane for front door to car ?ADLs Comments: pt generally MOD I in ADLs, intermittent assist when needed ?  ? ?  ?  ?OT Problem List: Decreased activity tolerance;Decreased knowledge of use of DME or AE;Decreased safety awareness;Decreased knowledge of precautions;Pain ?  ?   ?OT Treatment/Interventions: Self-care/ADL training;DME and/or AE instruction;Therapeutic activities;Balance training;Energy conservation;Patient/family education;Therapeutic exercise  ?  ?OT Goals(Current goals can be found in the care plan section) Acute Rehab OT Goals ?Patient Stated Goal: to get back home with granddaughter ?OT Goal Formulation: With patient ?Time For Goal Achievement: 08/05/21 ?Potential to Achieve Goals: Good  ?OT Frequency: Min 2X/week ?  ? ?Co-evaluation   ?  ?  ?  ?  ? ?  ?AM-PAC OT "6 Clicks" Daily Activity     ?Outcome Measure Help from another person eating meals?: None ?Help from another person taking care of personal grooming?: A Little ?Help from another person toileting, which includes using toliet, bedpan, or urinal?: A Little ?Help from another person bathing (including washing, rinsing, drying)?: A Little ?Help from another person to put on and taking off regular upper body clothing?: A Little ?Help from another person to put on and taking off regular lower body clothing?: A Little ?6 Click Score: 19 ?  ?End of Session  Equipment Utilized During Treatment: Rolling walker (2 wheels) ?Nurse Communication: Other (comment) (patients tearfulness during session and HR/O2) ? ?Activity Tolerance: Patient tolerated treatment well ?Patient left: in chair;with call bell/phone within reach;with chair alarm set ? ?OT Visit Diagnosis: Unsteadiness on feet (R26.81);Pain  ?              ?Time: 0459-1368 ?OT Time Calculation (min): 22 min ?Charges:  OT General Charges ?$OT Visit: 1 Visit ?OT Evaluation ?$OT Eval Low Complexity: 1 Low ? ?Sherril Shipman OTR/L, MS ?Acute Rehabilitation Department ?Office#  [phone] ?Pager# [phone] ? ? ?[place] ?07/22/2021, 12:58 PM ?

## 2021-07-23 DIAGNOSIS — E669 Obesity, unspecified: Secondary | ICD-10-CM | POA: Diagnosis present

## 2021-07-23 DIAGNOSIS — J441 Chronic obstructive pulmonary disease with (acute) exacerbation: Secondary | ICD-10-CM | POA: Diagnosis not present

## 2021-07-23 DIAGNOSIS — R739 Hyperglycemia, unspecified: Secondary | ICD-10-CM | POA: Diagnosis not present

## 2021-07-23 DIAGNOSIS — M069 Rheumatoid arthritis, unspecified: Secondary | ICD-10-CM | POA: Diagnosis not present

## 2021-07-23 DIAGNOSIS — E876 Hypokalemia: Secondary | ICD-10-CM | POA: Diagnosis not present

## 2021-07-23 DIAGNOSIS — F32A Depression, unspecified: Secondary | ICD-10-CM | POA: Diagnosis present

## 2021-07-23 LAB — BASIC METABOLIC PANEL
Anion gap: 7 (ref 5–15)
BUN: 29 mg/dL — ABNORMAL HIGH (ref 8–23)
CO2: 32 mmol/L (ref 22–32)
Calcium: 8.9 mg/dL (ref 8.9–10.3)
Chloride: 96 mmol/L — ABNORMAL LOW (ref 98–111)
Creatinine, Ser: 0.8 mg/dL (ref 0.44–1.00)
GFR, Estimated: >60 mL/min (ref >60)
Glucose, Bld: 132 mg/dL — ABNORMAL HIGH (ref 70–99)
Potassium: 4.8 mmol/L (ref 3.5–5.1)
Sodium: 135 mmol/L (ref 135–145)

## 2021-07-23 LAB — GLUCOSE, CAPILLARY
Glucose-Capillary: 118 mg/dL — ABNORMAL HIGH (ref 70–99)
Glucose-Capillary: 155 mg/dL — ABNORMAL HIGH (ref 70–99)
Glucose-Capillary: 157 mg/dL — ABNORMAL HIGH (ref 70–99)
Glucose-Capillary: 140 mg/dL — ABNORMAL HIGH (ref 70–99)

## 2021-07-23 MED ORDER — METHYLPREDNISOLONE SODIUM SUCC 40 MG IJ SOLR
40.0000 mg | Freq: Every day | INTRAMUSCULAR | Status: DC
Start: 2021-07-24 — End: 2021-07-25
  Administered 2021-07-24 – 2021-07-25 (×2): 40 mg via INTRAVENOUS
  Filled 2021-07-23 (×2): qty 1

## 2021-07-23 MED ORDER — GUAIFENESIN-DM 100-10 MG/5ML PO SYRP: 5.0000 mL | ORAL_SOLUTION | ORAL | Status: DC | PRN

## 2021-07-23 NOTE — Assessment & Plan Note (Signed)
Continue with sertraline  

## 2021-07-23 NOTE — Assessment & Plan Note (Signed)
Calculated BMI is 36.9  ?

## 2021-07-23 NOTE — Progress Notes (Addendum)
?  Progress Note ? ? ?Patient: [name] QXI:[id number] DOB: 21-Oct-1935 DOA: 07/19/2021     4 ?DOS: the patient was seen and examined on 07/23/2021 ?  ?Brief hospital course: ?Mrs. Carr was admitted to the hospital with the working diagnosis of COPD exacerbation.  ? ?86 yo female with the past medical history of COPD, RA, dyslipidemia, who presented with cough. Reported 5 days of worsening dyspnea and weakness, refractive to inhalers at home. On her initial physical examination her blood pressure was 123/64, HR 96, RR 17 and 02 saturation 88%, lungs with decreased breath sounds bilaterally, positive wheezing and increased work or breathing, heart with S1 and S2 present and rhythmic, abdomen soft and no lower extremity edema.  ? ?Na 139, K 3,1 Cl 106, bicarbonate 28 glucose 128 bun 12 cr 0,78 ?Wbc 9,2 hgb 13,8 hct 43,8 plt 419  ?Sars covid 19 negative  ? ?Chest radiograph with hyperinflation, no infiltrates.  ? ?EKG 95 bpm, normal axis and normal intervals, sinus rhythm with poor R wave progression, no significant ST segment or T wave changes. ? ?Patient was placed on systemic steroids and aggressive bronchodilator therapy. ?Slowly improving her symptoms.  ? ?Assessment and Plan: ?* COPD exacerbation ([place]) ?Acute on chronic hypoxemic respiratory failure.  ?Oxymetry is 95% on 3 L/min per [place] ? ?Slowly improving but not yet back to her baseline.  ? ?Continue with aggressive bronchodilator therapy with duoneb q 6 hrs and as needed q 2 hrs. ?Decrease methylprednisolone to 40 mg daily. ?Azithromycin for 5 days for airway inflammation.  ?Continue with antitussive agents and airway clearing techniques with flutter valve and incentive spirometer.  ?Out of bed to chair tid with meals ?PT and OT.  ? ? ?Hypokalemia ?Hyponatremia, hypokalemia/ hyperkalemia. ? ?Electrolytes have been corrected, with K at 4.8 and serum bicarbonate at 32, with Na 135. Renal function with serum cr at 0,80.  ? ?Patient tolerating po well.   ? ? ?Rheumatoid arthritis ([place]) ?Stable, positive deformities in bilateral hands.  ? ?Malnutrition of moderate degree ?Continue with nutritional supplements.  ? ?Steroid-induced hyperglycemia ?Continue tapering steroids. ?Fasting glucose today is 132. ?Continue glucose cover and monitoring with insulin sliding scale.  ? ?Dyslipidemia ?Continue with statin therapy.  ? ?Depression ?Continue with sertraline  ? ? ? ? ?  ? ?Subjective: patient is feeling better but not back at her baseline, she continuet to have dyspnea and wheezing, positive dry cough  ? ?Physical Exam: ?Vitals:  ? 07/22/21 2056 07/22/21 2131 07/23/21 0553 07/23/21 0349  ?BP:   (!) 131/59   ?Pulse:   82   ?Resp:   18   ?Temp:   98.2 ?F (36.8 ?C)   ?TempSrc:   Oral   ?SpO2: 97%  97% 93%  ?Weight:  85.7 kg    ?Height:  5' (1.524 m)    ? ?Neurology awake and alert ?ENT with no pallor ?Cardiovascular with S1 and S2 present and rhythmic with no gallops or murmurs ?Respiratory with prolonged expiratory phase with scattered rales and rhonchi, no wheezing. ?Abdomen soft and not distended ?No lower extremity edema  ?Data Reviewed: ? ? ? ?Family Communication: no family at the bedside  ? ?Disposition: ?Status is: Inpatient ?Remains inpatient appropriate because: respiratory failure  ? Planned Discharge Destination: Home ? ?Author: ?Tawni Millers, MD ?07/23/2021 1:35 PM ? ?For on call review [email or web address].  ?

## 2021-07-24 DIAGNOSIS — J441 Chronic obstructive pulmonary disease with (acute) exacerbation: Secondary | ICD-10-CM | POA: Diagnosis not present

## 2021-07-24 DIAGNOSIS — F32A Depression, unspecified: Secondary | ICD-10-CM | POA: Diagnosis not present

## 2021-07-24 DIAGNOSIS — J9621 Acute and chronic respiratory failure with hypoxia: Secondary | ICD-10-CM | POA: Diagnosis not present

## 2021-07-24 DIAGNOSIS — E669 Obesity, unspecified: Secondary | ICD-10-CM | POA: Diagnosis not present

## 2021-07-24 LAB — GLUCOSE, CAPILLARY
Glucose-Capillary: 86 mg/dL (ref 70–99)
Glucose-Capillary: 150 mg/dL — ABNORMAL HIGH (ref 70–99)
Glucose-Capillary: 179 mg/dL — ABNORMAL HIGH (ref 70–99)
Glucose-Capillary: 120 mg/dL — ABNORMAL HIGH (ref 70–99)

## 2021-07-24 MED ORDER — ENOXAPARIN SODIUM 40 MG/0.4ML IJ SOSY
40.0000 mg | PREFILLED_SYRINGE | INTRAMUSCULAR | Status: DC
Start: 2021-07-24 — End: 2021-07-25
  Administered 2021-07-24: 40 mg via SUBCUTANEOUS
  Filled 2021-07-24: qty 0.4

## 2021-07-24 NOTE — Progress Notes (Signed)
Occupational Therapy Treatment ?Patient Details ?Name: [name] ?MRN: [id number] ?DOB: Nov 13, 1935 ?Today's Date: 07/24/2021 ? ? ?History of present illness Pt. is a 86 y.o. F presenting [facility] on 3/29 with a cough and increasing weakness over the past 5 days. She was found to be in COPD exacerberation. PMH significant HLD, RA, COPD on home O2 (2L). ?  ?OT comments ? Patient was noted to require less physical assistance for tasks with increased SOB noted during session with increased eduction provided to slow down with tasks and deep breathing strategies. Patient noted to have decreased safety awareness with increased cues to keep RW with her during ADLs rather than leave it behind. Nurse and MD educated on increased SOB on this date compared to previous session. Nurse and MD verbalized understanding. Patient would continue to benefit from skilled OT services at this time while admitted and after d/c to address noted deficits in order to improve overall safety and independence in ADLs.  ?  ? ?Recommendations for follow up therapy are one component of a multi-disciplinary discharge planning process, led by the attending physician.  Recommendations may be updated based on patient status, additional functional criteria and insurance authorization. ?   ?Follow Up Recommendations ? Home health OT  ?  ?Assistance Recommended at Discharge Frequent or constant Supervision/Assistance  ?Patient can return home with the following ? A little help with walking and/or transfers;Direct supervision/assist for medications management;Direct supervision/assist for financial management;Help with stairs or ramp for entrance;A little help with bathing/dressing/bathroom ?  ?Equipment Recommendations ? None recommended by OT  ?  ?Recommendations for Other Services   ? ?  ?Precautions / Restrictions Precautions ?Precautions: Fall ?Precaution Comments: monitor HR and O2 ?Restrictions ?Weight Bearing Restrictions: No  ? ? ?  ? ?Mobility  Bed Mobility ?Overal bed mobility: Needs Assistance ?Bed Mobility: Supine to Sit ?  ?  ?Supine to sit: Supervision, HOB elevated ?  ?  ?  ?  ? ?Transfers ?  ?  ?  ?  ?  ?  ?  ?  ?  ?  ?  ?  ?Balance   ?  ?  ?  ?  ?  ?  ?  ?  ?  ?  ?  ?  ?  ?  ?  ?  ?  ?  ?   ? ?ADL either performed or assessed with clinical judgement  ? ?ADL Overall ADL's : Needs assistance/impaired ?  ?  ?Grooming: Dance movement psychotherapist;Set up;Oral care;Standing ?Grooming Details (indicate cue type and reason): with increased SOB noted. patient was educated on stopping tasks and deep breathing multiple times during session. ?Upper Body Bathing: Set up;Sitting ?  ?  ?  ?  ?  ?Lower Body Dressing: Min guard;Sitting/lateral leans ?Lower Body Dressing Details (indicate cue type and reason): adjust socks sitting in recliner. ?Toilet Transfer: Min guard;Rolling walker (2 wheels);Regular Toilet;Ambulation ?Toilet Transfer Details (indicate cue type and reason): patient was noted to push walker away and appear to have no awareness of O2 cord location during movement with education on safety provided. ?Toileting- Clothing Manipulation and Hygiene: Supervision/safety;Sitting/lateral lean ?Toileting - Clothing Manipulation Details (indicate cue type and reason): patient completed hygiene seated with patient standing to pull up pants without using RW with patient reporting feeling like she is going to fall no LOB or instability noted but patient did have SOB with cues for deep breathing and taking it slow. patient was educated on using RW to give support for balance. ?  ?  ?  ?  ?  ? ?  Extremity/Trunk Assessment   ?  ?  ?  ?  ?  ? ?Vision   ?  ?  ?Perception   ?  ?Praxis   ?  ? ?Cognition Arousal/Alertness: Awake/alert ?Behavior During Therapy: [facility] for tasks assessed/performed ?Overall Cognitive Status: No family/caregiver present to determine baseline cognitive functioning ?  ?  ?  ?  ?  ?  ?  ?  ?  ?  ?  ?  ?  ?  ?  ?  ?General Comments: patient needed min cues for  safety during ADLs with patient noted to push walker away multiple times despite education provided ?  ?  ?   ?Exercises   ? ?  ?Shoulder Instructions   ? ? ?  ?General Comments    ? ? ?Pertinent Vitals/ Pain       Pain Assessment ?Pain Assessment: Faces ?Faces Pain Scale: No hurt ? ?Home Living   ?  ?  ?  ?  ?  ?  ?  ?  ?  ?  ?  ?  ?  ?  ?  ?  ?  ?  ? ?  ?Prior Functioning/Environment    ?  ?  ?  ?   ? ?Frequency ? Min 2X/week  ? ? ? ? ?  ?Progress Toward Goals ? ?OT Goals(current goals can now be found in the care plan section) ? Progress towards OT goals: Progressing toward goals ? ?   ?Plan Discharge plan remains appropriate   ? ?Co-evaluation ? ? ?   ?  ?  ?  ?  ? ?  ?AM-PAC OT "6 Clicks" Daily Activity     ?Outcome Measure ? ? Help from another person eating meals?: None ?Help from another person taking care of personal grooming?: A Little ?Help from another person toileting, which includes using toliet, bedpan, or urinal?: A Little ?Help from another person bathing (including washing, rinsing, drying)?: A Little ?Help from another person to put on and taking off regular upper body clothing?: A Little ?Help from another person to put on and taking off regular lower body clothing?: A Little ?6 Click Score: 19 ? ?  ?End of Session Equipment Utilized During Treatment: Rolling walker (2 wheels);Oxygen ? ?OT Visit Diagnosis: Unsteadiness on feet (R26.81);Pain ?  ?Activity Tolerance Patient tolerated treatment well ?  ?Patient Left in chair;with call bell/phone within reach;with chair alarm set ?  ?Nurse Communication Other (comment) (increased SOB during session) ?  ? ?   ? ?Time: 2426-8341 ?OT Time Calculation (min): 14 min ? ?Charges: OT General Charges ?$OT Visit: 1 Visit ?OT Treatments ?$Self Care/Home Management : 8-22 mins ? ?Emeril Stille OTR/L, MS ?Acute Rehabilitation Department ?Office# [phone] ?Pager# [phone] ? ? ?Feliz Beam [place] ?07/24/2021, 9:08 AM ?

## 2021-07-24 NOTE — Progress Notes (Signed)
Physical Therapy Treatment ?Patient Details ?Name: [name] ?MRN: [id number] ?DOB: Dec 23, 1935 ?Today's Date: 07/24/2021 ? ? ?History of Present Illness Pt. is a 86 y.o. F presenting to [facility] on 3/29 with a cough and increasing weakness over the past 5 days. She was found to be in COPD exacerberation. PMH significant HLD, RA, COPD on home O2 (2L). ? ?  ?PT Comments  ? ? Pt agreeable to working with therapy. O2 87% on 3L, HR 135 bpm, dyspnea 3.5/4 with ambulation. 2 seated rest breaks required during session.Will continue to follow and progress activity as tolerated.    ?Recommendations for follow up therapy are one component of a multi-disciplinary discharge planning process, led by the attending physician.  Recommendations may be updated based on patient status, additional functional criteria and insurance authorization. ? ?Follow Up Recommendations ? Home health PT ?  ?  ?Assistance Recommended at Discharge Intermittent Supervision/Assistance  ?Patient can return home with the following A little help with walking and/or transfers;A little help with bathing/dressing/bathroom;Help with stairs or ramp for entrance ?  ?Equipment Recommendations ? None recommended by PT  ?  ?Recommendations for Other Services   ? ? ?  ?Precautions / Restrictions Precautions ?Precautions: Fall ?Precaution Comments: monitor HR and O2 ?Restrictions ?Weight Bearing Restrictions: No  ?  ? ?Mobility ? Bed Mobility ?Overal bed mobility: Needs Assistance ?Bed Mobility: Supine to Sit, Sit to Supine ?  ?  ?Supine to sit: Modified independent (Device/Increase time) ?Sit to supine: Modified independent (Device/Increase time) ?  ?  ?  ? ?Transfers ?Overall transfer level: Needs assistance ?  ?Transfers: Sit to/from Stand ?Sit to Stand: Supervision ?  ?  ?  ?  ?  ?General transfer comment: cues for safety, hand placement, proper operation of rollator (pt uses at baseline) ?  ? ?Ambulation/Gait ?Ambulation/Gait assistance: Supervision ?Gait Distance  (Feet): 80 Feet (x2) ?Assistive device: Rollator (4 wheels) ?Gait Pattern/deviations: Step-through pattern, Decreased stride length, Trunk flexed ?  ?  ?  ?General Gait Details: Pt walked ~80 feet before requiring a seated rest break. O2 87% on 3L O2, HR 135 bp, dypsnea 3.5/4. ? ? ?Stairs ?  ?  ?  ?  ?  ? ? ?Wheelchair Mobility ?  ? ?Modified Rankin (Stroke Patients Only) ?  ? ? ?  ?Balance Overall balance assessment: Needs assistance ?  ?  ?  ?  ?Standing balance support: During functional activity, Bilateral upper extremity supported ?Standing balance-Leahy Scale: Fair ?  ?  ?  ?  ?  ?  ?  ?  ?  ?  ?  ?  ?  ? ?  ?Cognition Arousal/Alertness: Awake/alert ?Behavior During Therapy: [facility] for tasks assessed/performed ?Overall Cognitive Status: No family/caregiver present to determine baseline cognitive functioning ?Area of Impairment: Safety/judgement ?  ?  ?  ?  ?  ?  ?  ?  ?  ?  ?  ?  ?  ?  ?  ?General Comments: patient needed min cues for safety ?  ?  ? ?  ?Exercises   ? ?  ?General Comments   ?  ?  ? ?Pertinent Vitals/Pain Pain Assessment ?Pain Assessment: Faces ?Faces Pain Scale: Hurts a little bit ?Pain Location: R knee ?Pain Descriptors / Indicators: Sore, Discomfort ?Pain Intervention(s): Limited activity within patient's tolerance, Monitored during session, Repositioned  ? ? ?Home Living   ?  ?  ?  ?  ?  ?  ?  ?  ?  ?   ?  ?  Prior Function    ?  ?  ?   ? ?PT Goals (current goals can now be found in the care plan section) Progress towards PT goals: Progressing toward goals ? ?  ?Frequency ? ? ? Min 3X/week ? ? ? ?  ?PT Plan Current plan remains appropriate  ? ? ?Co-evaluation   ?  ?  ?  ?  ? ?  ?AM-PAC PT "6 Clicks" Mobility   ?Outcome Measure ? Help needed turning from your back to your side while in a flat bed without using bedrails?: None ?Help needed moving from lying on your back to sitting on the side of a flat bed without using bedrails?: None ?Help needed moving to and from a bed to a chair (including a  wheelchair)?: A Little ?Help needed standing up from a chair using your arms (e.g., wheelchair or bedside chair)?: A Little ?Help needed to walk in hospital room?: A Little ?Help needed climbing 3-5 steps with a railing? : A Little ?6 Click Score: 20 ? ?  ?End of Session Equipment Utilized During Treatment: Oxygen ?Activity Tolerance: Patient limited by fatigue (limited by dyspnea, increased HR, drop in O2 sats) ?Patient left: in bed;with call bell/phone within reach ?  ?PT Visit Diagnosis: Difficulty in walking, not elsewhere classified (R26.2) ?  ? ? ?Time: 2376-2831 ?PT Time Calculation (min) (ACUTE ONLY): 20 min ? ?Charges:  $Gait Training: 8-22 mins          ?          ? ? ? ? ? ?Doreatha Massed, PT ?Acute Rehabilitation  ?Office: [phone] ?   ? ?

## 2021-07-24 NOTE — Progress Notes (Signed)
?PROGRESS NOTE ? ? ? [name]  KNL:[id number] DOB: March 17, 1936 DOA: 07/19/2021 ?PCP: [name], MD  ? ? ?Brief Narrative:  ?86 yo female with past medical history of COPD, RA, dyslipidemia  presented to the hospital with cough, worsening dyspnea and weakness, refractive to inhalers at home. On her initial physical examination, oxygen saturation 88%, lungs with decreased breath sounds bilaterally, positive wheezing and increased work or breathing. Initial potassium was 3.0.  Covid 19 was negative. Chest radiograph with hyperinflation, no infiltrates.  EKG was unremarkable.  Patient was started on steroids and bronchodilators and was admitted to the hospital. ?   ?Assessment and Plan: ? ?* COPD exacerbation ([place]) ?Continue oxygen nebulizers and steroids incentive spirometry flutter valve.  Still has significant wheezing and is symptomatic continue Zithromax empirically.  Recently had chest CT on 07/19/2021 which showed no evidence of pulmonary consolidation.  Chest x-ray from 07/19/2021 showed peribronchial thickening ? ?Acute on chronic hypoxemic respiratory failure.  ?Patient uses 3 L of oxygen by nasal cannula at baseline.  Continue oxygen supplementation.  Continue treatment for COPD. ? ?Hypokalemia ?Improved.  Latest potassium of 4.8. ? ?Rheumatoid arthritis ([place]) ?Stable, no acute flare at this time. ? ?Malnutrition of moderate degree ?Present on admission.  Continue Ensure. ? ?Steroid-induced hyperglycemia ?We will continue sliding scale insulin while on steroids. ? ?Dyslipidemia ?Continue Lipitor. ? ?Depression ?Continue with sertraline  ? ?Class 2 obesity ?Calculated BMI is 36.9  ? ? DVT prophylaxis:   Add Lovenox subcu ? ? ?Code Status:   ?  Code Status: Full Code ? ?Disposition: Home health PT OT likely in 1 to 2 days  ? ?Status is: Inpatient ? ?Remains inpatient appropriate because: COPD exacerbation ? ? Family Communication:  ?Spoke with the patient at bedside. ? ?Consultants:   ?None ? ?Procedures:  ?None ? ?Antimicrobials:  ?Azithromycin ? ?Anti-infectives (From admission, onward)  ? ? Start     Dose/Rate Route Frequency Ordered Stop  ? 07/21/21 1000  azithromycin (ZITHROMAX) tablet 500 mg  Status:  Discontinued       ?See Hyperspace for full Linked Orders Report.  ? 500 mg Oral Daily 07/19/21 1825 07/19/21 1828  ? 07/20/21 1100  azithromycin (ZITHROMAX) tablet 250 mg       ? 250 mg Oral Daily 07/20/21 1011 07/24/21 0959  ? 07/20/21 1000  azithromycin (ZITHROMAX) 500 mg in sodium chloride 0.9 % 250 mL IVPB  Status:  Discontinued       ?See Hyperspace for full Linked Orders Report.  ? 500 mg ?250 mL/hr over 60 Minutes Intravenous Every 24 hours 07/19/21 1825 07/19/21 1828  ? 07/20/21 1000  azithromycin (ZITHROMAX) tablet 500 mg  Status:  Discontinued       ? 500 mg Oral Daily 07/19/21 1829 07/20/21 1011  ? 07/19/21 1545  azithromycin (ZITHROMAX) 500 mg in sodium chloride 0.9 % 250 mL IVPB       ? 500 mg ?250 mL/hr over 60 Minutes Intravenous  Once 07/19/21 1542 07/19/21 1659  ? ?  ? ? ?Subjective: ?Today, patient was seen and examined at bedside.  Patient still complains of cough with greenish phlegm production and wheezing.  Shortness of breath little better but he still has dyspnea and desaturates easily. ? ?Objective: ?Vitals:  ? 07/23/21 2142 07/24/21 0510 07/24/21 0800 07/24/21 0857  ?BP:  (!) 137/117  (!) 120/57  ?Pulse:  80  80  ?Resp:  18    ?Temp:  98.2 ?F (36.8 ?C)    ?TempSrc:  Oral    ?SpO2: 97% 93% 96%   ?Weight:      ?Height:      ? ? ?Intake/Output Summary (Last 24 hours) at 07/24/2021 1235 ?Last data filed at 07/24/2021 1003 ?Gross per 24 hour  ?Intake 1560 ml  ?Output 400 ml  ?Net 1160 ml  ? ?Filed Weights  ? 07/21/21 0258 07/22/21 2131  ?Weight: 85.7 kg 85.7 kg  ? ? ?Physical Examination: ? ?General:  Average built, not in obvious distress, hard of hearing, on nasal cannula oxygen ?HENT:   No scleral pallor or icterus noted. Oral mucosa is moist.  ?Chest:    Diminished  breath sounds bilaterally.  Bilateral diffuse wheezing noted. ?CVS: S1 &S2 heard. No murmur.  Regular rate and rhythm. ?Abdomen: Soft, nontender, nondistended.  Bowel sounds are heard.   ?Extremities: No cyanosis, clubbing or edema.  Peripheral pulses are palpable. ?Psych: Alert, awake and oriented, normal mood ?CNS:  No cranial nerve deficits.  Power equal in all extremities.  Hard of hearing. ?Skin: Warm and dry.  No rashes noted. ? ?Data Reviewed:  ? ?CBC: ?Recent Labs  ?Lab 07/19/21 ?1412 07/19/21 ?1818 07/20/21 ?0533  ?WBC 9.2 9.1 6.9  ?NEUTROABS 6.9  --   --   ?HGB 13.8 14.1 12.7  ?HCT 43.8 44.2 40.8  ?MCV 88.7 88.2 88.1  ?PLT 419* 433* 393  ? ? ?Basic Metabolic Panel: ?Recent Labs  ?Lab 07/19/21 ?1412 07/19/21 ?1818 07/20/21 ?5277 07/21/21 ?8242 07/22/21 ?3536 07/23/21 ?1443  ?NA 139  --  135 134* 136 135  ?K 3.1*  --  4.5 5.4* 5.5* 4.8  ?CL 106  --  103 102 100 96*  ?CO2 28  --  '24 26 31 '$ 32  ?GLUCOSE 126*  --  139* 148* 154* 132*  ?BUN 12  --  20 21 25* 29*  ?CREATININE 0.78 0.87 0.73 0.87 0.86 0.80  ?CALCIUM 7.8*  --  8.9 8.7* 8.9 8.9  ?MG  --  1.7  --  1.8  --   --   ? ? ?Liver Function Tests: ?Recent Labs  ?Lab 07/19/21 ?1412 07/20/21 ?0533  ?AST 16 16  ?ALT 13 13  ?ALKPHOS 56 55  ?BILITOT 0.6 0.3  ?PROT 6.2* 6.5  ?ALBUMIN 3.2* 3.5  ? ? ? ?Radiology Studies: ?No results found. ? ? ? LOS: 5 days  ? ? ?Flora Lipps, MD ?[facility] ?07/24/2021, 12:35 PM  ? ? ?

## 2021-07-24 NOTE — Progress Notes (Incomplete Revision)
?PROGRESS NOTE ? ? ? [name]  FYB:[id number] DOB: October 24, 1935 DOA: 07/19/2021 ?PCP: [name], MD  ? ? ?Brief Narrative:  ?86 yo female with past medical history of COPD, RA, dyslipidemia  presented to the hospital with cough, worsening dyspnea and weakness, refractive to inhalers at home. On her initial physical examination, oxygen saturation 88%, lungs with decreased breath sounds bilaterally, positive wheezing and increased work or breathing. Initial potassium was 3.0.  Covid 19 was negative. Chest radiograph with hyperinflation, no infiltrates.  EKG was unremarkable.  Patient was started on steroids and bronchodilators and was admitted to the hospital. ?   ?Assessment and Plan: ? ?* COPD exacerbation ([place]) ?Continue oxygen nebulizers and steroids incentive spirometry flutter valve.  Still has significant wheezing and is symptomatic continue Zithromax empirically.  Recently had chest CT on 07/19/2021 which showed no evidence of pulmonary consolidation.  Chest x-ray from 07/19/2021 showed peribronchial thickening ? ?Acute on chronic hypoxemic respiratory failure.  ?Patient uses 3 L of oxygen by nasal cannula at baseline.  Continue oxygen supplementation.  Continue treatment for COPD. ? ?Hypokalemia ?Improved.  Latest potassium of 4.8. ? ?Rheumatoid arthritis ([place]) ?Stable, no acute flare at this time. ? ?Malnutrition of moderate degree ?Present on admission.  Continue Ensure. ? ?Steroid-induced hyperglycemia ?We will continue sliding scale insulin while on steroids. ? ?Dyslipidemia ?Continue Lipitor. ? ?Depression ?Continue with sertraline  ? ?Class 2 obesity ?Calculated BMI is 36.9  ? ? DVT prophylaxis:   Add Lovenox subcu ? ?Code Status:   ?  Code Status: Full Code ? ?Disposition: Home health PT, OT likely in 1 to 2 days,   ? ?Status is: Inpatient ? ?Remains inpatient appropriate because: COPD exacerbation ? ? Family Communication:  ?Spoke with the patient at bedside. I also spoke with the patient's  sister on the phone and updated her about the clinical condition of the patient. Patient lives by herself.  ? ?Consultants:  ?None ? ?Procedures:  ?None ? ?Antimicrobials:  ?Azithromycin ? ?Anti-infectives (From admission, onward)  ? ? Start     Dose/Rate Route Frequency Ordered Stop  ? 07/21/21 1000  azithromycin (ZITHROMAX) tablet 500 mg  Status:  Discontinued       ?See Hyperspace for full Linked Orders Report.  ? 500 mg Oral Daily 07/19/21 1825 07/19/21 1828  ? 07/20/21 1100  azithromycin (ZITHROMAX) tablet 250 mg       ? 250 mg Oral Daily 07/20/21 1011 07/24/21 0959  ? 07/20/21 1000  azithromycin (ZITHROMAX) 500 mg in sodium chloride 0.9 % 250 mL IVPB  Status:  Discontinued       ?See Hyperspace for full Linked Orders Report.  ? 500 mg ?250 mL/hr over 60 Minutes Intravenous Every 24 hours 07/19/21 1825 07/19/21 1828  ? 07/20/21 1000  azithromycin (ZITHROMAX) tablet 500 mg  Status:  Discontinued       ? 500 mg Oral Daily 07/19/21 1829 07/20/21 1011  ? 07/19/21 1545  azithromycin (ZITHROMAX) 500 mg in sodium chloride 0.9 % 250 mL IVPB       ? 500 mg ?250 mL/hr over 60 Minutes Intravenous  Once 07/19/21 1542 07/19/21 1659  ? ?  ? ? ?Subjective: ?Today, patient was seen and examined at bedside.  Patient still complains of cough with greenish phlegm production and wheezing.  Shortness of breath little better but he still has dyspnea and desaturates easily. ? ?Objective: ?Vitals:  ? 07/23/21 2142 07/24/21 0510 07/24/21 0800 07/24/21 0857  ?BP:  (!) 137/117  Marland Kitchen)  120/57  ?Pulse:  80  80  ?Resp:  18    ?Temp:  98.2 ?F (36.8 ?C)    ?TempSrc:  Oral    ?SpO2: 97% 93% 96%   ?Weight:      ?Height:      ? ? ?Intake/Output Summary (Last 24 hours) at 07/24/2021 1235 ?Last data filed at 07/24/2021 1003 ?Gross per 24 hour  ?Intake 1560 ml  ?Output 400 ml  ?Net 1160 ml  ? ?Filed Weights  ? 07/21/21 2924 07/22/21 2131  ?Weight: 85.7 kg 85.7 kg  ? ? ?Physical Examination: ? ?General:  Average built, not in obvious distress, hard of  hearing, on nasal cannula oxygen ?HENT:   No scleral pallor or icterus noted. Oral mucosa is moist.  ?Chest:    Diminished breath sounds bilaterally.  Bilateral diffuse wheezing noted. ?CVS: S1 &S2 heard. No murmur.  Regular rate and rhythm. ?Abdomen: Soft, nontender, nondistended.  Bowel sounds are heard.   ?Extremities: No cyanosis, clubbing or edema.  Peripheral pulses are palpable. ?Psych: Alert, awake and oriented, normal mood ?CNS:  No cranial nerve deficits.  Power equal in all extremities.  Hard of hearing. ?Skin: Warm and dry.  No rashes noted. ? ?Data Reviewed:  ? ?CBC: ?Recent Labs  ?Lab 07/19/21 ?1412 07/19/21 ?1818 07/20/21 ?0533  ?WBC 9.2 9.1 6.9  ?NEUTROABS 6.9  --   --   ?HGB 13.8 14.1 12.7  ?HCT 43.8 44.2 40.8  ?MCV 88.7 88.2 88.1  ?PLT 419* 433* 393  ? ? ?Basic Metabolic Panel: ?Recent Labs  ?Lab 07/19/21 ?1412 07/19/21 ?1818 07/20/21 ?4628 07/21/21 ?6381 07/22/21 ?7711 07/23/21 ?6579  ?NA 139  --  135 134* 136 135  ?K 3.1*  --  4.5 5.4* 5.5* 4.8  ?CL 106  --  103 102 100 96*  ?CO2 28  --  '24 26 31 '$ 32  ?GLUCOSE 126*  --  139* 148* 154* 132*  ?BUN 12  --  20 21 25* 29*  ?CREATININE 0.78 0.87 0.73 0.87 0.86 0.80  ?CALCIUM 7.8*  --  8.9 8.7* 8.9 8.9  ?MG  --  1.7  --  1.8  --   --   ? ? ?Liver Function Tests: ?Recent Labs  ?Lab 07/19/21 ?1412 07/20/21 ?0533  ?AST 16 16  ?ALT 13 13  ?ALKPHOS 56 55  ?BILITOT 0.6 0.3  ?PROT 6.2* 6.5  ?ALBUMIN 3.2* 3.5  ? ? ? ?Radiology Studies: ?No results found. ? ? ? LOS: 5 days  ? ? ?Flora Lipps, MD ?[facility] ?07/24/2021, 12:35 PM  ? ? ?

## 2021-07-25 DIAGNOSIS — J9621 Acute and chronic respiratory failure with hypoxia: Secondary | ICD-10-CM | POA: Diagnosis not present

## 2021-07-25 DIAGNOSIS — F32A Depression, unspecified: Secondary | ICD-10-CM | POA: Diagnosis not present

## 2021-07-25 DIAGNOSIS — J441 Chronic obstructive pulmonary disease with (acute) exacerbation: Secondary | ICD-10-CM | POA: Diagnosis not present

## 2021-07-25 DIAGNOSIS — E669 Obesity, unspecified: Secondary | ICD-10-CM | POA: Diagnosis not present

## 2021-07-25 LAB — GLUCOSE, CAPILLARY
Glucose-Capillary: 82 mg/dL (ref 70–99)
Glucose-Capillary: 141 mg/dL — ABNORMAL HIGH (ref 70–99)

## 2021-07-25 MED ORDER — GUAIFENESIN-DM 100-10 MG/5ML PO SYRP: 5.0000 mL | ORAL_SOLUTION | ORAL | 0 refills | Status: AC | PRN

## 2021-07-25 NOTE — TOC Transition Note (Signed)
Transition of Care (TOC) - CM/SW Discharge Note ? ? ?Patient Details  ?Name: [name] ?MRN: [id number] ?Date of Birth: 08-Jan-1936 ? ?Transition of Care (TOC) CM/SW Contact:  ?Haadi Santellan, Marjie Skiff, RN ?Phone Number: ?07/25/2021, 12:55 PM ? ? ?Clinical Narrative:    ?Spoke with pt for DC planning. She politely declines home health services. She states she has a cane, walker and lift chair at home currently. She states that she does not have a travel 02 to take home with her. She is active with [employer] for home 02. [employer] contacted to have travel tank delivered to the hospital room. ? ? ?  ?Barriers to Discharge: Continued Medical Work up ? ? ?Readmission Risk Interventions ?   ? View : No data to display.  ?  ?  ?  ? ? ? ? ? ?

## 2021-07-25 NOTE — Progress Notes (Signed)
PIV removed. Discharge instructions completed. Patient and sister verbalized understanding of medication regimen, follow up appointments and discharge instructions. Patient belongings gathered and packed to discharge. Will take to DC area as sister pulls car around. ? ? ?

## 2021-07-25 NOTE — Discharge Summary (Addendum)
?Physician Discharge Summary ?  ?Patient: [name] MRN: [id number] DOB: 14-Mar-1936  ?Admit date:     07/19/2021  ?Discharge date: 07/27/21  ?Discharge Physician: Corrie Mckusick Evonda Enge  ? ?PCP: [name], MD  ? ?Recommendations at discharge:  ? ?Follow-up with your primary care physician in 1 week.   ?Check CBC BMP magnesium in the next visit. ? ?Discharge Diagnoses: ?Active Problems: ?  Rheumatoid arthritis ([place]) ?  Malnutrition of moderate degree ?  Steroid-induced hyperglycemia ?  Dyslipidemia ?  Depression ?  Class 2 obesity ?  Chronic respiratory failure with hypoxia ([facility]) ? ?Principal Problem (Resolved): ?  COPD exacerbation ([place]) ?Resolved Problems: ?  Hypokalemia ? ?Hospital Course: ?86 yo female with past medical history of COPD, RA, dyslipidemia  presented to the hospital with cough, worsening dyspnea and weakness, refractive to inhalers at home. On her initial physical examination, oxygen saturation 88%, lungs with decreased breath sounds bilaterally, positive wheezing and increased work or breathing. Initial potassium was 3.0.  Covid 19 was negative. Chest radiograph with hyperinflation, no infiltrates.  EKG was unremarkable.  Patient was started on steroids and bronchodilators and was admitted to the hospital.   ?Following conditions were addressed during hospitalization, ? ?Assessment and Plan: ?COPD exacerbation ([place]) ?Patient received oxygen nebulizers and steroids incentive spirometry flutter valve during hospitalization and had a very slow improvement process..  Also received Zithromax empirically.  Recently had chest CT on 07/19/2021 which showed no evidence of pulmonary consolidation.  Chest x-ray from 07/19/2021 showed peribronchial thickening.  At this time patient was advised to not overexert and continue bronchodilators at home. ?  ?Chronic hypoxemic respiratory failure.  ?Patient uses 3 L of oxygen by nasal cannula at baseline.  Will be resumed on discharge.   ? ?Hypokalemia ?Improved.   Latest potassium of 4.8. ?  ?Rheumatoid arthritis ([place]) ?Stable, no acute flare at this time.  On low-dose of prednisone at home. ?  ?Malnutrition of moderate degree ?Present on admission.  Received Ensure supplements during hospitalization but ?  ?Steroid-induced hyperglycemia ?Received sliding scale while on steroids..  On daily steroids at home. ?  ?Dyslipidemia ?Continue Lipitor. ?  ?Depression ?Continue with sertraline  ?  ?Class 2 obesity ?Calculated BMI is 36.9  ? ?Deconditioning, debility.  Patient lives by herself at home.  Spoke with the patient about potential rehabilitation.  She wishes to go home with home health services.  I also spoke with the patient's sister about it. ? ?Consultants: None ? ?Procedures performed: None ? ?Disposition: Home health I spoke with the patient's sister on the phone on 07/24/2021 ? ?Diet recommendation:  ?Discharge Diet Orders (From admission, onward)  ? ?  Start     Ordered  ? 07/25/21 0000  Diet general       ? 07/25/21 0954  ? ?  ?  ? ?  ? ?Regular diet ?DISCHARGE MEDICATION: ?Allergies as of 07/25/2021   ? ?   Reactions  ? Kapidex [dexlansoprazole] Other (See Comments)  ? Stomach pain, diarrhea  ? Zegerid [omeprazole] Other (See Comments)  ? Burning in chest  ? ?  ? ?  ?Medication List  ?  ? ?TAKE these medications   ? ?acetaminophen 650 MG CR tablet ?Commonly known as: TYLENOL ?Take 1,300 mg by mouth every 8 (eight) hours as needed for pain. ?  ?albuterol (2.5 MG/3ML) 0.083% nebulizer solution ?Commonly known as: PROVENTIL ?Take 3 mLs (2.5 mg total) by nebulization every 4 (four) hours as needed for wheezing or shortness of  breath. For shortness of breath. ?What changed: additional instructions ?  ?aspirin EC 81 MG tablet ?Take 81 mg by mouth daily. ?  ?atorvastatin 40 MG tablet ?Commonly known as: LIPITOR ?Take 40 mg by mouth every morning. ?  ?azelastine 0.1 % nasal spray ?Commonly known as: ASTELIN ?Place 1 spray into the nose 2 (two) times daily as needed for  allergies. ?  ?Calcium-Vitamin D 500-3.125 MG-MCG Tabs ?Take 1 tablet by mouth daily. ?  ?carboxymethylcellulose 0.5 % Soln ?Commonly known as: REFRESH PLUS ?Place 2 drops into both eyes 2 (two) times daily as needed (dry eye). ?  ?cetirizine 10 MG tablet ?Commonly known as: ZYRTEC ?Take 1 tablet (10 mg total) by mouth every morning. ?  ?esomeprazole 20 MG capsule ?Commonly known as: [place] ?Take 20 mg by mouth daily at 12 noon. ?  ?Fluticasone-Umeclidin-Vilant 100-62.5-25 MCG/ACT Aepb ?Commonly known as: Trelegy Ellipta ?Take 1 puff by mouth daily. ?  ?folic acid 1 MG tablet ?Commonly known as: FOLVITE ?Take 1 tablet (1 mg total) by mouth daily. ?  ?gabapentin 100 MG capsule ?Commonly known as: NEURONTIN ?Take 2-3 capsules (200-300 mg total) by mouth 3 (three) times daily. 200 mg every AM and at noon and 300 mg QHS ?What changed: additional instructions ?  ?guaiFENesin-dextromethorphan 100-10 MG/5ML syrup ?Commonly known as: ROBITUSSIN DM ?Take 5 mLs by mouth every 4 (four) hours as needed for cough. ?  ?hydroxychloroquine 200 MG tablet ?Commonly known as: PLAQUENIL ?Take 200 mg by mouth daily. ?  ?OXYGEN ?Inhale 1-6 L into the lungs continuous. ?  ?polyethylene glycol 17 g packet ?Commonly known as: MIRALAX / GLYCOLAX ?Take 17 g by mouth daily. ?What changed:  ?when to take this ?reasons to take this ?  ?predniSONE 5 MG tablet ?Commonly known as: DELTASONE ?Take 5 mg by mouth daily with breakfast. ?  ?sertraline 100 MG tablet ?Commonly known as: ZOLOFT ?Take 150 mg by mouth daily. ?  ?Systane 0.4-0.3 % Soln ?Generic drug: Polyethyl Glycol-Propyl Glycol ?Place 1 drop into both eyes daily as needed (dry eyes). ?  ? ?  ? ?Subjective: ?Today, patient was seen and examined at bedside.  Denies interval complaints, pain, nausea, vomiting. ? Follow-up Information   ? ? [name], MD Follow up.   ?Specialty: Family Medicine ?Contact information: ?[address] ?[place] [address] ?[phone] ? ? ?  ?  ? ?  Jerline Pain, MD .   ?Specialty: Cardiology ?Contact information: ?[address] ?[address] ?[place] [address] ?[phone] ? ? ?  ?  ? ?  ?  ? ?  ? ?Discharge Exam: ?Filed Weights  ? 07/21/21 3154 07/22/21 2131  ?Weight: 85.7 kg 85.7 kg  ? ? ?  07/25/2021  ?  5:25 AM 07/24/2021  ?  9:59 PM 07/24/2021  ?  1:46 PM  ?Vitals with BMI  ?Systolic 008 676 195  ?Diastolic 67 89 75  ?Pulse 78 77 95  ?  ?Body mass index is 36.9 kg/m?.  ? ?General: Obese built, not in obvious distress, on nasal cannula oxygen, hard of hearing ?HENT:   No scleral pallor or icterus noted. Oral mucosa is moist.  ?Chest:    Diminished breath sounds bilaterally. No crackles or wheezes.  ?CVS: S1 &S2 heard. No murmur.  Regular rate and rhythm. ?Abdomen: Soft, nontender, nondistended.  Bowel sounds are heard.   ?Extremities: No cyanosis, clubbing or edema.  Peripheral pulses are palpable. ?Psych: Alert, awake and oriented, normal mood ?CNS:  No cranial  nerve deficits.  Power equal in all extremities.  Hard of hearing. ?Skin: Warm and dry.  No rashes noted. ? ?Condition at discharge: good ? ?The results of significant diagnostics from this hospitalization (including imaging, microbiology, ancillary and laboratory) are listed below for reference.  ? ?Imaging Studies: ?DG Chest 2 View ? ?Result Date: 07/19/2021 ?CLINICAL DATA:  Cough EXAM: CHEST - 2 VIEW COMPARISON:  05/11/2021. FINDINGS: The heart size and mediastinal contours are within normal limits. Redemonstrated hyperinflation and emphysema. Mild peribronchial thickening. No focal pulmonary opacity. No pleural effusion or pneumothorax. No acute osseous abnormality. IMPRESSION: Peribronchial thickening, which can be seen in the setting of small airways disease (viral versus reactive). No focal pulmonary opacity to suggest pneumonia. Electronically Signed   By: Merilyn Baba M.D.   On: 07/19/2021 14:04  ? ?CT Chest Wo Contrast ? ?Result Date: 07/19/2021 ?CLINICAL DATA:  Pneumonia, cough EXAM:  CT CHEST WITHOUT CONTRAST TECHNIQUE: Multidetector CT imaging of the chest was performed following the standard protocol without IV contrast. RADIATION DOSE REDUCTION: This exam was performed according to the departm

## 2021-07-26 DIAGNOSIS — Z20822 Contact with and (suspected) exposure to covid-19: Secondary | ICD-10-CM | POA: Diagnosis not present

## 2021-07-28 DIAGNOSIS — Z20822 Contact with and (suspected) exposure to covid-19: Secondary | ICD-10-CM | POA: Diagnosis not present

## 2021-08-03 DIAGNOSIS — J441 Chronic obstructive pulmonary disease with (acute) exacerbation: Secondary | ICD-10-CM | POA: Diagnosis not present

## 2021-08-03 DIAGNOSIS — J449 Chronic obstructive pulmonary disease, unspecified: Secondary | ICD-10-CM | POA: Diagnosis not present

## 2021-08-03 DIAGNOSIS — F332 Major depressive disorder, recurrent severe without psychotic features: Secondary | ICD-10-CM | POA: Diagnosis not present

## 2021-08-03 DIAGNOSIS — K219 Gastro-esophageal reflux disease without esophagitis: Secondary | ICD-10-CM | POA: Diagnosis not present

## 2021-08-03 DIAGNOSIS — M0579 Rheumatoid arthritis with rheumatoid factor of multiple sites without organ or systems involvement: Secondary | ICD-10-CM | POA: Diagnosis not present

## 2021-08-03 DIAGNOSIS — E782 Mixed hyperlipidemia: Secondary | ICD-10-CM | POA: Diagnosis not present

## 2021-08-29 DIAGNOSIS — M154 Erosive (osteo)arthritis: Secondary | ICD-10-CM | POA: Diagnosis not present

## 2021-08-29 DIAGNOSIS — Z79899 Other long term (current) drug therapy: Secondary | ICD-10-CM | POA: Diagnosis not present

## 2021-08-29 DIAGNOSIS — M797 Fibromyalgia: Secondary | ICD-10-CM | POA: Diagnosis not present

## 2021-08-29 DIAGNOSIS — M5412 Radiculopathy, cervical region: Secondary | ICD-10-CM | POA: Diagnosis not present

## 2021-08-29 DIAGNOSIS — M0579 Rheumatoid arthritis with rheumatoid factor of multiple sites without organ or systems involvement: Secondary | ICD-10-CM | POA: Diagnosis not present

## 2021-08-29 DIAGNOSIS — J449 Chronic obstructive pulmonary disease, unspecified: Secondary | ICD-10-CM | POA: Diagnosis not present

## 2021-08-29 DIAGNOSIS — M5459 Other low back pain: Secondary | ICD-10-CM | POA: Diagnosis not present

## 2021-08-29 DIAGNOSIS — M1990 Unspecified osteoarthritis, unspecified site: Secondary | ICD-10-CM | POA: Diagnosis not present

## 2021-10-19 DIAGNOSIS — M0579 Rheumatoid arthritis with rheumatoid factor of multiple sites without organ or systems involvement: Secondary | ICD-10-CM | POA: Diagnosis not present

## 2021-11-02 DIAGNOSIS — F332 Major depressive disorder, recurrent severe without psychotic features: Secondary | ICD-10-CM | POA: Diagnosis not present

## 2021-11-02 DIAGNOSIS — E782 Mixed hyperlipidemia: Secondary | ICD-10-CM | POA: Diagnosis not present

## 2021-11-02 DIAGNOSIS — Z9981 Dependence on supplemental oxygen: Secondary | ICD-10-CM | POA: Diagnosis not present

## 2021-11-02 DIAGNOSIS — M179 Osteoarthritis of knee, unspecified: Secondary | ICD-10-CM | POA: Diagnosis not present

## 2021-11-02 DIAGNOSIS — R7303 Prediabetes: Secondary | ICD-10-CM | POA: Diagnosis not present

## 2021-11-02 DIAGNOSIS — Z79899 Other long term (current) drug therapy: Secondary | ICD-10-CM | POA: Diagnosis not present

## 2021-11-02 DIAGNOSIS — K219 Gastro-esophageal reflux disease without esophagitis: Secondary | ICD-10-CM | POA: Diagnosis not present

## 2021-11-02 DIAGNOSIS — M0579 Rheumatoid arthritis with rheumatoid factor of multiple sites without organ or systems involvement: Secondary | ICD-10-CM | POA: Diagnosis not present

## 2021-11-02 DIAGNOSIS — J3089 Other allergic rhinitis: Secondary | ICD-10-CM | POA: Diagnosis not present

## 2021-11-02 DIAGNOSIS — J449 Chronic obstructive pulmonary disease, unspecified: Secondary | ICD-10-CM | POA: Diagnosis not present

## 2021-11-30 DIAGNOSIS — M154 Erosive (osteo)arthritis: Secondary | ICD-10-CM | POA: Diagnosis not present

## 2021-11-30 DIAGNOSIS — M5459 Other low back pain: Secondary | ICD-10-CM | POA: Diagnosis not present

## 2021-11-30 DIAGNOSIS — R079 Chest pain, unspecified: Secondary | ICD-10-CM | POA: Diagnosis not present

## 2021-11-30 DIAGNOSIS — M1990 Unspecified osteoarthritis, unspecified site: Secondary | ICD-10-CM | POA: Diagnosis not present

## 2021-11-30 DIAGNOSIS — M797 Fibromyalgia: Secondary | ICD-10-CM | POA: Diagnosis not present

## 2021-11-30 DIAGNOSIS — M0579 Rheumatoid arthritis with rheumatoid factor of multiple sites without organ or systems involvement: Secondary | ICD-10-CM | POA: Diagnosis not present

## 2021-11-30 DIAGNOSIS — J449 Chronic obstructive pulmonary disease, unspecified: Secondary | ICD-10-CM | POA: Diagnosis not present

## 2021-11-30 DIAGNOSIS — M5412 Radiculopathy, cervical region: Secondary | ICD-10-CM | POA: Diagnosis not present

## 2021-11-30 DIAGNOSIS — Z79899 Other long term (current) drug therapy: Secondary | ICD-10-CM | POA: Diagnosis not present

## 2021-12-02 ENCOUNTER — Emergency Department (HOSPITAL_COMMUNITY): Payer: Medicare HMO

## 2021-12-02 ENCOUNTER — Emergency Department (HOSPITAL_COMMUNITY)
Admission: EM | Admit: 2021-12-02 | Discharge: 2021-12-02 | Disposition: A | Discharge status: Home / Self Care | Payer: Medicare HMO | Attending: Emergency Medicine | Admitting: Emergency Medicine

## 2021-12-02 ENCOUNTER — Encounter (HOSPITAL_COMMUNITY): Payer: Self-pay

## 2021-12-02 ENCOUNTER — Other Ambulatory Visit: Payer: Self-pay

## 2021-12-02 DIAGNOSIS — C761 Malignant neoplasm of thorax: Secondary | ICD-10-CM | POA: Diagnosis not present

## 2021-12-02 DIAGNOSIS — R079 Chest pain, unspecified: Secondary | ICD-10-CM | POA: Diagnosis not present

## 2021-12-02 DIAGNOSIS — R109 Unspecified abdominal pain: Secondary | ICD-10-CM | POA: Diagnosis not present

## 2021-12-02 DIAGNOSIS — R0789 Other chest pain: Secondary | ICD-10-CM | POA: Insufficient documentation

## 2021-12-02 DIAGNOSIS — R059 Cough, unspecified: Secondary | ICD-10-CM | POA: Diagnosis not present

## 2021-12-02 DIAGNOSIS — Z20822 Contact with and (suspected) exposure to covid-19: Secondary | ICD-10-CM | POA: Diagnosis not present

## 2021-12-02 DIAGNOSIS — F17210 Nicotine dependence, cigarettes, uncomplicated: Secondary | ICD-10-CM | POA: Insufficient documentation

## 2021-12-02 DIAGNOSIS — R112 Nausea with vomiting, unspecified: Secondary | ICD-10-CM | POA: Insufficient documentation

## 2021-12-02 DIAGNOSIS — Z859 Personal history of malignant neoplasm, unspecified: Secondary | ICD-10-CM | POA: Diagnosis not present

## 2021-12-02 DIAGNOSIS — R110 Nausea: Secondary | ICD-10-CM

## 2021-12-02 DIAGNOSIS — R0602 Shortness of breath: Secondary | ICD-10-CM | POA: Diagnosis not present

## 2021-12-02 DIAGNOSIS — R69 Illness, unspecified: Secondary | ICD-10-CM | POA: Diagnosis not present

## 2021-12-02 DIAGNOSIS — C801 Malignant (primary) neoplasm, unspecified: Secondary | ICD-10-CM

## 2021-12-02 DIAGNOSIS — C762 Malignant neoplasm of abdomen: Secondary | ICD-10-CM | POA: Diagnosis not present

## 2021-12-02 LAB — HEPATIC FUNCTION PANEL
ALT: 42 U/L (ref 0–44)
AST: 86 U/L — ABNORMAL HIGH (ref 15–41)
Albumin: 3.3 g/dL — ABNORMAL LOW (ref 3.5–5.0)
Alkaline Phosphatase: 203 U/L — ABNORMAL HIGH (ref 38–126)
Bilirubin, Direct: 0.2 mg/dL (ref 0.0–0.2)
Indirect Bilirubin: 0.3 mg/dL (ref 0.3–0.9)
Total Bilirubin: 0.5 mg/dL (ref 0.3–1.2)
Total Protein: 6.8 g/dL (ref 6.5–8.1)

## 2021-12-02 LAB — CBC WITH DIFFERENTIAL/PLATELET
Abs Immature Granulocytes: 0.07 10*3/uL (ref 0.00–0.07)
Basophils Absolute: 0.1 10*3/uL (ref 0.0–0.1)
Basophils Relative: 0 %
Eosinophils Absolute: 0 10*3/uL (ref 0.0–0.5)
Eosinophils Relative: 0 %
HCT: 35.4 % — ABNORMAL LOW (ref 36.0–46.0)
Hemoglobin: 11.5 g/dL — ABNORMAL LOW (ref 12.0–15.0)
Immature Granulocytes: 1 %
Lymphocytes Relative: 11 %
Lymphs Abs: 1.2 10*3/uL (ref 0.7–4.0)
MCH: 28.8 pg (ref 26.0–34.0)
MCHC: 32.5 g/dL (ref 30.0–36.0)
MCV: 88.7 fL (ref 80.0–100.0)
Monocytes Absolute: 1 10*3/uL (ref 0.1–1.0)
Monocytes Relative: 9 %
Neutro Abs: 9.1 10*3/uL — ABNORMAL HIGH (ref 1.7–7.7)
Neutrophils Relative %: 79 %
Platelets: 425 10*3/uL — ABNORMAL HIGH (ref 150–400)
RBC: 3.99 MIL/uL (ref 3.87–5.11)
RDW: 15.8 % — ABNORMAL HIGH (ref 11.5–15.5)
WBC: 11.5 10*3/uL — ABNORMAL HIGH (ref 4.0–10.5)
nRBC: 0 % (ref 0.0–0.2)

## 2021-12-02 LAB — BRAIN NATRIURETIC PEPTIDE: B Natriuretic Peptide: 109.6 pg/mL — ABNORMAL HIGH (ref 0.0–100.0)

## 2021-12-02 LAB — BASIC METABOLIC PANEL WITH GFR
Anion gap: 11 (ref 5–15)
BUN: 25 mg/dL — ABNORMAL HIGH (ref 8–23)
CO2: 26 mmol/L (ref 22–32)
Calcium: 9.1 mg/dL (ref 8.9–10.3)
Chloride: 101 mmol/L (ref 98–111)
Creatinine, Ser: 1.08 mg/dL — ABNORMAL HIGH (ref 0.44–1.00)
GFR, Estimated: 51 mL/min — ABNORMAL LOW
Glucose, Bld: 118 mg/dL — ABNORMAL HIGH (ref 70–99)
Potassium: 4.8 mmol/L (ref 3.5–5.1)
Sodium: 138 mmol/L (ref 135–145)

## 2021-12-02 LAB — TROPONIN I (HIGH SENSITIVITY)
Troponin I (High Sensitivity): 6 ng/L
Troponin I (High Sensitivity): 7 ng/L

## 2021-12-02 LAB — PROTIME-INR
INR: 1 (ref 0.8–1.2)
Prothrombin Time: 13.3 seconds (ref 11.4–15.2)

## 2021-12-02 LAB — MAGNESIUM: Magnesium: 1.8 mg/dL (ref 1.7–2.4)

## 2021-12-02 LAB — SARS CORONAVIRUS 2 BY RT PCR: SARS Coronavirus 2 by RT PCR: NEGATIVE

## 2021-12-02 LAB — LACTIC ACID, PLASMA: Lactic Acid, Venous: 1.5 mmol/L (ref 0.5–1.9)

## 2021-12-02 MED ORDER — ONDANSETRON HCL 4 MG PO TABS: 4.0000 mg | ORAL_TABLET | Freq: Once | ORAL | 0 refills | Status: AC

## 2021-12-02 MED ORDER — IOHEXOL 300 MG/ML  SOLN
100.0000 mL | Freq: Once | INTRAMUSCULAR | Status: AC | PRN
Start: 2021-12-02 — End: 2021-12-02
  Administered 2021-12-02: 100 mL via INTRAVENOUS

## 2021-12-02 MED ORDER — LORAZEPAM 2 MG/ML IJ SOLN
1.0000 mg | Freq: Once | INTRAMUSCULAR | Status: AC
  Administered 2021-12-02: 1 mg via INTRAVENOUS
  Filled 2021-12-02: qty 1

## 2021-12-02 MED ORDER — SODIUM CHLORIDE (PF) 0.9 % IJ SOLN
INTRAMUSCULAR | Status: AC
  Filled 2021-12-02: qty 50

## 2021-12-02 NOTE — ED Triage Notes (Signed)
Pt arrives with c/o increasing SOB and chest pain. Per pt, chest pain started about 3-4 days ago. Pt endorse some n/v and rectal bleeding. Pt has COPD and wears 3L of O2 at home.

## 2021-12-02 NOTE — ED Provider Notes (Signed)
[facility] DEPT Provider Note   CSN: [id number] Arrival date & time: 12/02/21  1611     History Chief Complaint  Patient presents with   Shortness of Breath    HPI [name] is a 86 y.o. female presenting for multiple complaints.  She endorses episodic shortness of breath mild chest pain and nausea with vomiting over the past 2 weeks episodically.  Today she felt weaker than normal and had worsening chest pain with cough.  She has a 40-pack-year smoking history. Nonbilious nonbloody vomiting x1 today. She endorses developing weight loss over the past few weeks. Patient's recorded medical, surgical, social, medication list and allergies were reviewed in the Snapshot window as part of the initial history.   Review of Systems   Review of Systems  Constitutional:  Positive for fatigue and unexpected weight change. Negative for chills and fever.  HENT:  Negative for ear pain and sore throat.   Eyes:  Negative for pain and visual disturbance.  Respiratory:  Negative for cough and shortness of breath.   Cardiovascular:  Negative for chest pain and palpitations.  Gastrointestinal:  Positive for nausea and vomiting. Negative for abdominal pain.  Genitourinary:  Negative for dysuria and hematuria.  Musculoskeletal:  Negative for arthralgias and back pain.  Skin:  Negative for color change and rash.  Neurological:  Negative for seizures and syncope.  All other systems reviewed and are negative.   Physical Exam Updated Vital Signs BP 123/66   Pulse 95   Temp 97.9 F (36.6 C) (Oral)   Resp 18   Ht 5' (1.524 m)   Wt 83.9 kg   SpO2 96%   BMI 36.13 kg/m  Physical Exam Vitals and nursing note reviewed.  Constitutional:      General: She is not in acute distress.    Appearance: She is well-developed.  HENT:     Head: Normocephalic and atraumatic.  Eyes:     Conjunctiva/sclera: Conjunctivae normal.  Cardiovascular:     Rate and Rhythm:  Normal rate and regular rhythm.     Heart sounds: No murmur heard. Pulmonary:     Effort: Pulmonary effort is normal. No respiratory distress.     Breath sounds: Normal breath sounds.  Abdominal:     General: There is no distension.     Palpations: Abdomen is soft.     Tenderness: There is abdominal tenderness. There is no right CVA tenderness, left CVA tenderness or guarding.  Musculoskeletal:        General: No swelling or tenderness. Normal range of motion.     Cervical back: Neck supple.  Skin:    General: Skin is warm and dry.  Neurological:     General: No focal deficit present.     Mental Status: She is alert and oriented to person, place, and time. Mental status is at baseline.     Cranial Nerves: No cranial nerve deficit.      ED Course/ Medical Decision Making/ A&P    Procedures Procedures   Medications Ordered in ED Medications  sodium chloride (PF) 0.9 % injection (has no administration in time range)  LORazepam (ATIVAN) injection 1 mg (1 mg Intravenous Given 12/02/21 1900)  iohexol (OMNIPAQUE) 300 MG/ML solution 100 mL (100 mLs Intravenous Contrast Given 12/02/21 1920)    Medical Decision Making:    SHALAY CARDER is a 86 y.o. female who presented to the ED today with multiple concerns detailed above.     Additional history  discussed with patient's family/caregivers.  Patient's presentation is complicated by their history of multiple comorbid medical problems including COPD on 3 L at baseline.  Patient placed on continuous vitals and telemetry monitoring while in ED which was reviewed periodically.   Complete initial physical exam performed, notably the patient  was hemodynamically stable in no acute distress.  Lung sounds clear to auscultation bilaterally.  Patient satting 100% on her baseline 3 L.  Patient with mild abdominal tenderness with no focal guarding appreciated.      Reviewed and confirmed nursing documentation for past medical history, family  history, social history.    Initial Assessment:   With the patient's presentation of chest pain, fatigue, nausea, most likely diagnosis is nonspecific gastroenteritis. Other diagnoses were considered including (but not limited to) ACS, pulmonary embolism, appendicitis, cholecystitis, small bowel obstruction. These are considered less likely due to history of present illness and physical exam findings.   This is most consistent with an acute life/limb threatening illness complicated by underlying chronic conditions.  Initial Plan:  CTA PE study to evaluate for thoracic vascular etiology for patient's chest pain and shortness of breath COVID screening to evaluate for infectious etiology of patient's symptoms CT abdomen pelvis to evaluate for structural intra-abdominal etiology of patient's symptoms Screening labs including CBC and Metabolic panel to evaluate for infectious or metabolic etiology of disease.  Considered urine testing, however patient has advanced disease and denies any urinary discomfort at this time EKG and troponin and BNP to evaluate for cardiac pathology. Objective evaluation as below reviewed with plan for close reassessment  Initial Study Results:   Laboratory  All laboratory results reviewed without evidence of clinically relevant pathology.   Exceptions include: Mild AKI, appears to be prerenal with a BUN to creatinine of 25.  Slight BNP elevation, white blood cell count elevation.  Slight BNP elevation.  Electrolytes within normal limits.  EKG EKG was reviewed independently. Rate, rhythm, axis, intervals all examined and without medically relevant abnormality. ST segments without concerns for elevations.    Radiology  All images reviewed independently. Agree with radiology report at this time.   CT ABDOMEN PELVIS W CONTRAST  Result Date: 12/02/2021 CLINICAL DATA:  Abdominal pain, chest pain, back pain EXAM: CT ABDOMEN AND PELVIS WITH CONTRAST TECHNIQUE: Multidetector  CT imaging of the abdomen and pelvis was performed using the standard protocol following bolus administration of intravenous contrast. RADIATION DOSE REDUCTION: This exam was performed according to the departmental dose-optimization program which includes automated exposure control, adjustment of the mA and/or kV according to patient size and/or use of iterative reconstruction technique. CONTRAST:  157m OMNIPAQUE IOHEXOL 300 MG/ML  SOLN COMPARISON:  None Available. FINDINGS: Lower chest: Enlarged lymph nodes are seen in mediastinum and right hilum. Hepatobiliary: There are numerous space-occupying lesions of varying sizes scattered throughout the liver consistent with extensive hepatic metastatic disease. Largest of the lesions measures approximately 3.6 x 2.7 cm. There is no dilation of bile ducts. Surgical clips are seen in gallbladder fossa. Pancreas: No focal abnormalities are seen. Spleen: Unremarkable. Adrenals/Urinary Tract: Adrenals are unremarkable. There is no hydronephrosis. There are no renal or ureteral stones. Urinary bladder is not distended. Small pocket of air is seen in the lumen of the bladder. There is no wall thickening in the bladder. There are few low-density lesions in renal cortex in both kidneys largest measuring 1.3 cm in size in the posterior margin of right kidney. Density measurements in the largest of the lesions is less than 20  Hounsfield units suggesting possible simple cysts. Stomach/Bowel: Stomach is not distended. Small bowel loops are not dilated. Appendix is not seen. There is no significant wall thickening in colon. Multiple diverticula are seen in colon without signs of focal acute diverticulitis. Vascular/Lymphatic: Scattered arterial calcifications are seen. No significant lymphadenopathy is seen in abdomen pelvis. Reproductive: Uterus is not seen.  There are no adnexal masses. Other: There is no ascites or pneumoperitoneum. Musculoskeletal: There is previous posterior  fusion at the L4-L5 level. Severe degenerative changes are noted at the L2-L3 level. No definite focal lytic or sclerotic lesions are seen. IMPRESSION: There are numerous space-occupying lesions of varying sizes scattered throughout the liver consistent with extensive hepatic metastatic disease. PET-CT and tissue sampling should be considered. There is no evidence of intestinal obstruction or pneumoperitoneum. There is no hydronephrosis. Diverticulosis of colon without signs of diverticulitis. Lumbar spondylosis. Small pocket of air seen in the urinary bladder may be due to recent instrumentation. Less likely possibility would be cystitis. There are possible small renal cysts. Electronically Signed   By: Elmer Picker M.D.   On: 12/02/2021 20:00   CT Angio Chest PE W and/or Wo Contrast  Result Date: 12/02/2021 CLINICAL DATA:  Chest pain, back pain, shortness of breath EXAM: CT ANGIOGRAPHY CHEST WITH CONTRAST TECHNIQUE: Multidetector CT imaging of the chest was performed using the standard protocol during bolus administration of intravenous contrast. Multiplanar CT image reconstructions and MIPs were obtained to evaluate the vascular anatomy. RADIATION DOSE REDUCTION: This exam was performed according to the departmental dose-optimization program which includes automated exposure control, adjustment of the mA and/or kV according to patient size and/or use of iterative reconstruction technique. CONTRAST:  13m OMNIPAQUE IOHEXOL 300 MG/ML  SOLN COMPARISON:  CT chest done on 07/19/2021, chest radiograph none earlier today FINDINGS: Cardiovascular: There is homogeneous enhancement in thoracic aorta. There are no intraluminal filling defects in pulmonary artery branches. Mediastinum/Nodes: There are abnormally enlarged lymph nodes in mediastinum and right hilum. Largest of the nodes in the mediastinum is noted in subcarinal region measuring 2.7 cm in short axis. This node measured 1.5 cm in short axis in the  previous study. There is enlarged lymph node in the right hilum measuring 1.8 cm in short axis. Lungs/Pleura: In image 52 of series 4, there is an oval shaped nodule measuring 1.7 x 1.1 cm in the inferior right hilum. This may suggest a lymph node or a lung nodule. In image 79 of series 10, there is faint 6 mm nodular density adjacent to the dome of right hemidiaphragm and right lower lobe. Small linear densities are seen in lower lung fields suggesting scarring or subsegmental atelectasis. There is no focal pulmonary consolidation. Upper Abdomen: There is fatty infiltration in liver. There are faint low-attenuation lesions in visualized portions of liver measuring up to 2.1 cm in diameter. Scattered diverticula are seen colon and splenic flexure. Musculoskeletal: Severe degenerative changes are noted in lower cervical spine. Review of the MIP images confirms the above findings. IMPRESSION: There is no evidence of pulmonary artery embolism. There is no evidence of thoracic aortic dissection. There are abnormally enlarged lymph nodes in mediastinum and right hilum suggesting possible malignant neoplastic process such as metastatic disease. There is a 1.7 x 1.1 cm smoothly marginated nodular density in the inferior right hilum which may suggest enlarged lymph node or a parenchymal nodule in the lung fields. There is a faint 6 mm nodular density in right lower lobe adjacent to the dome of right lobe.  There are multiple space-occupying lesions in liver suggesting possible hepatic metastatic disease. Follow-up PET-CT and tissue sampling as warranted should be considered. Electronically Signed   By: Elmer Picker M.D.   On: 12/02/2021 19:52   DG Chest 2 View  Result Date: 12/02/2021 CLINICAL DATA:  Chest pain, shortness of breath EXAM: CHEST - 2 VIEW COMPARISON:  07/19/2021 FINDINGS: There is poor inspiration. Cardiac size is within normal limits. Lung fields are clear of any infiltrates or pulmonary edema.  There is no significant pleural effusion or pneumothorax. Degenerative changes are noted in right shoulder. IMPRESSION: There are no signs of pulmonary edema or new focal infiltrates. Electronically Signed   By: Elmer Picker M.D.   On: 12/02/2021 17:33      Final Assessment and Plan:   Patient's history of present illness and physical exam findings are not consistent with any acute intra-abdominal or intrathoracic etiology.  On the generally, patient does have substantial abnormalities including likely metastatic cancer in the abdomen and chest.  Patient was directly informed of the concern for malignant process and message was sent to patient's PCP to assist in coordinating follow-up.  Phone number to arrange oncologic care given to patient's sister in discharge paperwork.  Additionally, patient feels symptomatically mildly improved after administration of IV fluids.  Will prescribe patient antiemetics due to likely mild hepatitis in the setting of multiple malignant lesions to assist with the patient's ongoing p.o. tolerance.  She is currently tolerating p.o. fluids in no acute distress.  Patient stable for continued outpatient care management with no evidence of emergent pathology requiring admission at this time. Disposition:  Based on the above findings, I believe patient is stable for discharge.    Patient/family educated about specific return precautions for given chief complaint and symptoms.  Patient/family educated about follow-up with PCP and Oncology.     Patient explicitly told about concern for malignancy and importance of PCP/Oncologic  follow up.   Patient/family expressed understanding of return precautions and need for follow-up. Patient spoken to regarding all imaging and laboratory results and appropriate follow up for these results. All education provided in verbal form with additional information in written form. Time was allowed for answering of patient questions. Patient  discharged.    Emergency Department Medication Summary:   Medications  sodium chloride (PF) 0.9 % injection (has no administration in time range)  LORazepam (ATIVAN) injection 1 mg (1 mg Intravenous Given 12/02/21 1900)  iohexol (OMNIPAQUE) 300 MG/ML solution 100 mL (100 mLs Intravenous Contrast Given 12/02/21 1920)          Clinical Impression:  1. Nausea   2. Cancer [facility])      Discharge   Final Clinical Impression(s) / ED Diagnoses Final diagnoses:  Cancer ([place])  Nausea    Rx / DC Orders ED Discharge Orders          Ordered    ondansetron (ZOFRAN) 4 MG tablet   Once        12/02/21 2034              Tretha Sciara, MD 12/02/21 2041

## 2021-12-02 NOTE — Discharge Instructions (Signed)
You were seen today for nausea and fatigue.  Unfortunately your evaluation is most consistent with developing likely malignant process including liver cancer. It is critical that you follow-up with an oncologist, you may call the attached phone number to schedule this appointment.  Additionally I have sent a message to your primary care provider to assist you in obtaining this follow-up.  Furthermore, we have prescribed you Zofran to help with your developing nausea.  Thank you for the opportunity to participate in your care, please return with any substantial chest pains, nausea vomiting, dehydration or any new symptoms. Tretha Sciara MD

## 2021-12-02 NOTE — ED Provider Triage Note (Signed)
Emergency Medicine Provider Triage Evaluation Note  [name] , a 86 y.o. female  was evaluated in triage.  Pt complains of chest pain and shortness of breath and 1 episode of vomiting, pain radiates to back and neck.  She is also having rectal bleeding, denies any mopped assist.  Not on blood thinners.  3 L of oxygen chronically, no increase in dose.  Review of Systems  Per HPI  Physical Exam  BP (!) 113/58   Pulse (!) 101   Temp 98 F (36.7 C) (Oral)   Resp (!) 22   Ht 5' (1.524 m)   Wt 83.9 kg   SpO2 99%   BMI 36.13 kg/m  Gen:   Awake, no distress   Resp:  Decreased air movement MSK:   Moves extremities without difficulty  Other:  Abdomen soft without focality, upper and lower extremity pulses symmetric  Medical Decision Making  Medically screening exam initiated at 4:43 PM.  Appropriate orders placed.  [name] was informed that the remainder of the evaluation will be completed by another provider, this initial triage assessment does not replace that evaluation, and the importance of remaining in the ED until their evaluation is complete.     Sherrill Raring, PA-C 12/02/21 1644

## 2021-12-04 ENCOUNTER — Other Ambulatory Visit: Payer: Self-pay | Admitting: Family Medicine

## 2021-12-04 ENCOUNTER — Other Ambulatory Visit (HOSPITAL_COMMUNITY): Payer: Self-pay | Admitting: Family Medicine

## 2021-12-04 DIAGNOSIS — C799 Secondary malignant neoplasm of unspecified site: Secondary | ICD-10-CM

## 2021-12-04 DIAGNOSIS — D849 Immunodeficiency, unspecified: Secondary | ICD-10-CM | POA: Diagnosis not present

## 2021-12-04 DIAGNOSIS — R69 Illness, unspecified: Secondary | ICD-10-CM | POA: Diagnosis not present

## 2021-12-04 DIAGNOSIS — Z9981 Dependence on supplemental oxygen: Secondary | ICD-10-CM | POA: Diagnosis not present

## 2021-12-04 DIAGNOSIS — J449 Chronic obstructive pulmonary disease, unspecified: Secondary | ICD-10-CM | POA: Diagnosis not present

## 2021-12-04 DIAGNOSIS — R110 Nausea: Secondary | ICD-10-CM | POA: Diagnosis not present

## 2021-12-08 ENCOUNTER — Encounter (HOSPITAL_COMMUNITY)
Admission: RE | Admit: 2021-12-08 | Discharge: 2021-12-08 | Disposition: A | Discharge status: Home / Self Care | Payer: Medicare HMO | Source: Ambulatory Visit | Attending: Family Medicine | Admitting: Family Medicine

## 2021-12-08 DIAGNOSIS — C799 Secondary malignant neoplasm of unspecified site: Secondary | ICD-10-CM | POA: Insufficient documentation

## 2021-12-08 LAB — GLUCOSE, CAPILLARY: Glucose-Capillary: 103 mg/dL — ABNORMAL HIGH (ref 70–99)

## 2021-12-08 MED ORDER — FLUDEOXYGLUCOSE F - 18 (FDG) INJECTION
9.2700 | Freq: Once | INTRAVENOUS | Status: AC | PRN
  Administered 2021-12-08: 9.27 via INTRAVENOUS

## 2021-12-14 ENCOUNTER — Inpatient Hospital Stay: Payer: Medicare Other

## 2021-12-14 ENCOUNTER — Inpatient Hospital Stay: Payer: Medicare Other | Attending: Hematology & Oncology

## 2021-12-14 ENCOUNTER — Encounter: Payer: Self-pay | Admitting: Hematology & Oncology

## 2021-12-14 ENCOUNTER — Other Ambulatory Visit: Payer: Self-pay | Admitting: *Deleted

## 2021-12-14 ENCOUNTER — Other Ambulatory Visit: Payer: Self-pay

## 2021-12-14 ENCOUNTER — Encounter: Payer: Self-pay | Admitting: *Deleted

## 2021-12-14 ENCOUNTER — Inpatient Hospital Stay (HOSPITAL_BASED_OUTPATIENT_CLINIC_OR_DEPARTMENT_OTHER): Payer: Medicare Other | Admitting: Hematology & Oncology

## 2021-12-14 VITALS — BP 94/39 | HR 95 | Temp 97.7°F | Resp 20 | Ht 59.84 in | Wt 185.2 lb

## 2021-12-14 DIAGNOSIS — Z87891 Personal history of nicotine dependence: Secondary | ICD-10-CM | POA: Diagnosis not present

## 2021-12-14 DIAGNOSIS — R918 Other nonspecific abnormal finding of lung field: Secondary | ICD-10-CM | POA: Diagnosis not present

## 2021-12-14 DIAGNOSIS — R971 Elevated cancer antigen 125 [CA 125]: Secondary | ICD-10-CM | POA: Insufficient documentation

## 2021-12-14 DIAGNOSIS — R978 Other abnormal tumor markers: Secondary | ICD-10-CM | POA: Insufficient documentation

## 2021-12-14 DIAGNOSIS — Z803 Family history of malignant neoplasm of breast: Secondary | ICD-10-CM | POA: Insufficient documentation

## 2021-12-14 DIAGNOSIS — R599 Enlarged lymph nodes, unspecified: Secondary | ICD-10-CM | POA: Insufficient documentation

## 2021-12-14 DIAGNOSIS — Z90710 Acquired absence of both cervix and uterus: Secondary | ICD-10-CM | POA: Insufficient documentation

## 2021-12-14 DIAGNOSIS — R160 Hepatomegaly, not elsewhere classified: Secondary | ICD-10-CM | POA: Diagnosis not present

## 2021-12-14 DIAGNOSIS — R970 Elevated carcinoembryonic antigen [CEA]: Secondary | ICD-10-CM | POA: Insufficient documentation

## 2021-12-14 DIAGNOSIS — C787 Secondary malignant neoplasm of liver and intrahepatic bile duct: Secondary | ICD-10-CM | POA: Insufficient documentation

## 2021-12-14 DIAGNOSIS — R112 Nausea with vomiting, unspecified: Secondary | ICD-10-CM

## 2021-12-14 DIAGNOSIS — J449 Chronic obstructive pulmonary disease, unspecified: Secondary | ICD-10-CM | POA: Insufficient documentation

## 2021-12-14 LAB — IRON AND IRON BINDING CAPACITY (CC-WL,HP ONLY)
Iron: 37 ug/dL (ref 28–170)
Saturation Ratios: 12 % (ref 10.4–31.8)
TIBC: 319 ug/dL (ref 250–450)
UIBC: 282 ug/dL (ref 148–442)

## 2021-12-14 LAB — CBC WITH DIFFERENTIAL (CANCER CENTER ONLY)
Abs Immature Granulocytes: 0.43 10*3/uL — ABNORMAL HIGH (ref 0.00–0.07)
Basophils Absolute: 0.1 10*3/uL (ref 0.0–0.1)
Basophils Relative: 1 %
Eosinophils Absolute: 0 10*3/uL (ref 0.0–0.5)
Eosinophils Relative: 0 %
HCT: 39.2 % (ref 36.0–46.0)
Hemoglobin: 12.1 g/dL (ref 12.0–15.0)
Immature Granulocytes: 3 %
Lymphocytes Relative: 14 %
Lymphs Abs: 1.8 10*3/uL (ref 0.7–4.0)
MCH: 28.1 pg (ref 26.0–34.0)
MCHC: 30.9 g/dL (ref 30.0–36.0)
MCV: 91 fL (ref 80.0–100.0)
Monocytes Absolute: 1 10*3/uL (ref 0.1–1.0)
Monocytes Relative: 8 %
Neutro Abs: 9.8 10*3/uL — ABNORMAL HIGH (ref 1.7–7.7)
Neutrophils Relative %: 74 %
Platelet Count: 397 10*3/uL (ref 150–400)
RBC: 4.31 MIL/uL (ref 3.87–5.11)
RDW: 15.8 % — ABNORMAL HIGH (ref 11.5–15.5)
WBC Count: 13.2 10*3/uL — ABNORMAL HIGH (ref 4.0–10.5)
nRBC: 0 % (ref 0.0–0.2)

## 2021-12-14 LAB — RETICULOCYTES
Immature Retic Fract: 17.1 % — ABNORMAL HIGH (ref 2.3–15.9)
RBC.: 4.25 MIL/uL (ref 3.87–5.11)
Retic Count, Absolute: 67.6 10*3/uL (ref 19.0–186.0)
Retic Ct Pct: 1.6 % (ref 0.4–3.1)

## 2021-12-14 LAB — CEA (IN HOUSE-CHCC): CEA (CHCC-In House): 1715.37 ng/mL — ABNORMAL HIGH (ref 0.00–5.00)

## 2021-12-14 LAB — CMP (CANCER CENTER ONLY)
ALT: 35 U/L (ref 0–44)
AST: 91 U/L — ABNORMAL HIGH (ref 15–41)
Albumin: 3.8 g/dL (ref 3.5–5.0)
Alkaline Phosphatase: 289 U/L — ABNORMAL HIGH (ref 38–126)
Anion gap: 11 (ref 5–15)
BUN: 28 mg/dL — ABNORMAL HIGH (ref 8–23)
CO2: 29 mmol/L (ref 22–32)
Calcium: 9.7 mg/dL (ref 8.9–10.3)
Chloride: 95 mmol/L — ABNORMAL LOW (ref 98–111)
Creatinine: 1.46 mg/dL — ABNORMAL HIGH (ref 0.44–1.00)
GFR, Estimated: 35 mL/min — ABNORMAL LOW (ref >60)
Glucose, Bld: 161 mg/dL — ABNORMAL HIGH (ref 70–99)
Potassium: 4.5 mmol/L (ref 3.5–5.1)
Sodium: 135 mmol/L (ref 135–145)
Total Bilirubin: 0.7 mg/dL (ref 0.3–1.2)
Total Protein: 6.9 g/dL (ref 6.5–8.1)

## 2021-12-14 LAB — PREALBUMIN: Prealbumin: 10 mg/dL — ABNORMAL LOW (ref 18–38)

## 2021-12-14 LAB — LACTATE DEHYDROGENASE: LDH: 1204 U/L — ABNORMAL HIGH (ref 98–192)

## 2021-12-14 LAB — FERRITIN: Ferritin: 274 ng/mL (ref 11–307)

## 2021-12-14 MED ORDER — SODIUM CHLORIDE 0.9 % IV SOLN: 8.0000 mg | Freq: Once | INTRAVENOUS | Status: DC

## 2021-12-14 MED ORDER — SODIUM CHLORIDE 0.9 % IV SOLN
10.0000 mg | Freq: Once | INTRAVENOUS | Status: AC
  Administered 2021-12-14: 10 mg via INTRAVENOUS
  Filled 2021-12-14: qty 10

## 2021-12-14 MED ORDER — LORAZEPAM 1 MG PO TABS
0.5000 mg | ORAL_TABLET | Freq: Once | ORAL | Status: AC
  Administered 2021-12-14: 0.5 mg via ORAL

## 2021-12-14 MED ORDER — SODIUM CHLORIDE 0.9 % IV SOLN: INTRAVENOUS | Status: AC

## 2021-12-14 MED ORDER — ONDANSETRON HCL 4 MG/2ML IJ SOLN
8.0000 mg | Freq: Once | INTRAMUSCULAR | Status: DC
  Filled 2021-12-14: qty 4

## 2021-12-14 MED ORDER — HYDROMORPHONE HCL 1 MG/ML IJ SOLN
0.5000 mg | Freq: Once | INTRAMUSCULAR | Status: DC
  Filled 2021-12-14: qty 1

## 2021-12-14 MED ORDER — LORAZEPAM 0.5 MG PO TABS: 0.5000 mg | ORAL_TABLET | Freq: Three times a day (TID) | ORAL | 0 refills | Status: AC

## 2021-12-14 MED ORDER — HYDROMORPHONE HCL 1 MG/ML IJ SOLN
0.5000 mg | Freq: Once | INTRAMUSCULAR | Status: AC
  Administered 2021-12-14: 0.5 mg via INTRAVENOUS
  Filled 2021-12-14: qty 1

## 2021-12-14 MED ORDER — ONDANSETRON HCL 4 MG/2ML IJ SOLN
8.0000 mg | Freq: Once | INTRAMUSCULAR | Status: AC
  Administered 2021-12-14: 8 mg via INTRAVENOUS
  Filled 2021-12-14: qty 4

## 2021-12-14 MED ORDER — HYDROMORPHONE HCL 1 MG/ML IJ SOLN
0.5000 mg | Freq: Once | INTRAMUSCULAR | Status: AC
  Administered 2021-12-14: 0.5 mg via INTRAVENOUS

## 2021-12-14 MED ORDER — SODIUM CHLORIDE 0.9 % IV SOLN
40.0000 mg | Freq: Once | INTRAVENOUS | Status: AC
  Administered 2021-12-14: 40 mg via INTRAVENOUS
  Filled 2021-12-14: qty 4

## 2021-12-14 NOTE — Progress Notes (Signed)
Initial RN Navigator Patient Visit  Name: [name] Date of Referral : 12/13/2021 Diagnosis: Metastatic Cancer  Met with patient prior to their visit with MD. Hanley Seamen patient "Your Patient Navigator" handout which explains my role, areas in which I am able to help, and all the contact information for myself and the office. Also gave patient MD and Navigator business card. Reviewed with patient the general overview of expected course after initial diagnosis and time frame for all steps to be completed.  New patient packet given to patient which includes: orientation to office and staff; campus directory; education on My Chart and Advance Directives; and patient centered education on Lung and generalized cancer diagnosis.   She comes with her sister [name], and her daughter,   Patient completed visit with Dr. Marin Olp.   Due to patient's age, performance status and extent of disease, the patient and family wish for hospice. Hospice RN's came to the office to assess patient and deemed her appropriate for [facility]. She was discharged from the office, to [facility]. Since patient is now in residential hospice, she will not need navigation.   Oncology Nurse Navigator Documentation     12/14/2021    8:00 AM  Oncology Nurse Navigator Flowsheets  Abnormal Finding Date 12/02/2021  Diagnosis Status Additional Work Up  Navigator Follow Up Date: 12/14/2021  Navigator Follow Up Reason: New Patient Appointment  Navigation Complete Date: 12/14/2021  Post Navigation: Continue to Follow Patient? No  Reason Not Navigating Patient: Airline pilot Encounter Type Introductory Phone Call  Patient Visit Type MedOnc  Treatment Phase Abnormal Scans  Barriers/Navigation Needs Coordination of Care;Education  Education Other  Interventions Coordination of Care;Education  Acuity Level 2-Minimal Needs (1-2 Barriers Identified)  Coordination of Care Appts  Education  Method Verbal  Support Groups/Services Friends and Family  Time Spent with Patient 30

## 2021-12-14 NOTE — Progress Notes (Signed)
Called patient twice with no response. Called her sister, Zella Ball, who is listed as her emergency contact. She is very concerned for patient as she feels like she is very ill and needs to be seen ASAP. She also mentions that the patient's two daughters have flown in from out of town and they won't be here much longer. Offered to bring patient in today. They state it'll be difficult, but she wants her seen today.

## 2021-12-14 NOTE — Progress Notes (Signed)
Referral MD  Reason for Referral: Excessive hepatic metastasis-likely bronchogenic carcinoma  Chief Complaint  Patient presents with   New Patient (Initial Visit)  : I just do not feel well.  HPI: Ms. Marcus is a very nice 86 year old white female.  She comes in with her daughter and one of her sisters.  She has been history of underlying COPD.  She has been followed by Pulmonary Medicine.  She has been having problems with decreased appetite.  She is lost some weight.  She ultimately ended up in the emergency room.  She has had some shortness of breath.  She had nausea and vomiting.  This been going on for quite a while.  She does have an extensive history of tobacco use.  She stopped several years ago.  I would have said that she probably has close to a 80-pack-year history of tobacco use.  She did not have any problems with diarrhea.  She had no rashes.  She had some wheezing.  She ultimately had a CT angiogram of the chest.  This showed enlarged lymph nodes in the mediastinum and right hilum.  Also noted was lesion in the liver.  She then underwent a CT of the abdomen pelvis.  This, surprising, showed that she had extensive hepatic metastasis.  She is incredibly weak.  She comes in a wheelchair.  She has a very poor appetite.  She has been having episodes of nausea and vomiting.  She has very poor living situation right now.  She apparently is living with some church members.  She has a strong history of cancer in the family.  She has had no obvious bleeding.  There is been no fever.  She has had no rashes.  Overall, I would have said that her performance status is probably ECOG 3, at best.    Past Medical History:  Diagnosis Date   ACID REFLUX DISEASE 05/15/2007   Qualifier: Diagnosis of  By: [name] MD, Legrand Como B    Acute and chronic respiratory failure with hypoxia ([place])    Acute on chronic respiratory failure with hypoxia ([place]) 06/08/2018   Acute respiratory failure with  hypercapnia ([facility])    Acute respiratory failure with hypoxia ([place]) 07/29/2017   Anemia 05/20/2016   Arthritis    Bladder neoplasm    Body mass index 34.0-34.9, adult    Bronchitis 07/06/2011   Chest pain 07/06/2011   Chronic cough    Chronic respiratory failure ([place])    Community acquired pneumonia    Constipation 07/03/2011   COPD (chronic obstructive pulmonary disease) ([place])    PULMOLOGIST-  DR [name]--  GOLD III W/  CHRONIC RESPIRATORY FAILURE--  O2 DEPENDENT   COPD with acute exacerbation ([place]) 06/29/2011   Coronary arteriosclerosis    Cough productive of purulent sputum    Depression    Dizziness    Dyslipidemia    Falls frequently    Fibromyalgia    GERD (gastroesophageal reflux disease)    Hearing loss in right ear    Hematuria    History of nephritis    as child dx w/ Bright's disease (glomerulonephritis)   HLD (hyperlipidemia) 05/15/2007   Qualifier: Diagnosis of  By: [name] MD, Legrand Como B    Hyperglycemia 05/20/2016   Hyperlipidemia    Hypomagnesemia    Hypoxemia    Iron deficiency anemia, unspecified    Leukopenia    Low vitamin D level    Mild obstructive sleep apnea    no cpap recommendation   Neuropathy  07/29/2017   Neuropathy, peripheral, idiopathic    Osteopenia    Other allergic rhinitis    Pelvic pain in female    Polyarthralgia    PONV (postoperative nausea and vomiting)    Pre-diabetes    Rheumatoid arthritis ([place]) 07/29/2017   Shoulder impingement syndrome 03/25/2015   Sleep disorder, unspecified    SOB (shortness of breath)    Spinal stenosis    Stage 3 severe COPD by GOLD classification ([place]) 06/19/2007   Followed in Pulmonary clinic/ [facility]/ [place]  - hfa 90% 05/15/2012  - PFTs 05/15/2012 FEV1  0.61 ( 36%) ratio 57 and 21% better p B2 and DLCO 44%  106 corrected  - Referred to [facility] 05/15/12 - Rec pulmonary f/u prn 02/11/13     Supplemental oxygen dependent    2L  via Nasal Canula --  activity and nighttime   Tachycardia    Urinary incontinence in  female   :   Past Surgical History:  Procedure Laterality Date   [place]  05-08-2007  dr Marlou Porch   minor coronary plaquing w/ no significant CAD/  20% mLAD,  10% mRCA,  perserved LV, ef 60-65%   CATARACT EXTRACTION W/ INTRAOCULAR LENS  IMPLANT, BILATERAL  2001 approx   CHOLECYSTECTOMY  1988   CYSTOSCOPY W/ RETROGRADES Bilateral 09/24/2014   Procedure: CYSTOSCOPY WITH RETROGRADE PYELOGRAM;  Surgeon: Festus Aloe, MD;  Location: [facility];  Service: Urology;  Laterality: Bilateral;   CYSTOSCOPY WITH BIOPSY N/A 09/24/2014   Procedure: CYSTOSCOPY WITH BIOPSY;  Surgeon: Festus Aloe, MD;  Location: [facility];  Service: Urology;  Laterality: N/A;   ESOPHAGOGASTRODUODENOSCOPY  last one 09-09-2008   FULGURATION OF BLADDER TUMOR N/A 09/24/2014   Procedure: FULGURATION OF BLADDER TUMOR;  Surgeon: Festus Aloe, MD;  Location: [facility];  Service: Urology;  Laterality: N/A;   [place] LUMBAR FUSION  10-07-2007   bilateral laminectomy L4 and bilateral L4-5 diskectomy w/ fusion   TONSILLECTOMY  as child   TOTAL ABDOMINAL HYSTERECTOMY W/ BILATERAL SALPINGOOPHORECTOMY  1973   TRANSTHORACIC ECHOCARDIOGRAM  07-07-2011   normal echo,  ef 65-70%  :   Current Outpatient Medications:    acetaminophen (TYLENOL) 650 MG CR tablet, Take 1,300 mg by mouth every 8 (eight) hours as needed for pain., Disp: , Rfl:    albuterol (PROVENTIL) (2.5 MG/3ML) 0.083% nebulizer solution, Take 3 mLs (2.5 mg total) by nebulization every 4 (four) hours as needed for wheezing or shortness of breath. For shortness of breath. (Patient taking differently: Take 2.5 mg by nebulization every 4 (four) hours as needed for wheezing or shortness of breath.), Disp: 75 mL, Rfl: 4   aspirin EC 81 MG tablet, Take 81 mg by mouth daily., Disp: , Rfl:    atorvastatin (LIPITOR) 40 MG tablet, Take 40 mg by mouth every morning. ,  Disp: , Rfl:    azelastine (ASTELIN) 0.1 % nasal spray, Place 1 spray into the nose 2 (two) times daily as needed for allergies. , Disp: , Rfl:    Calcium-Vitamin D 500-3.125 MG-MCG TABS, Take 1 tablet by mouth daily., Disp: , Rfl:    carboxymethylcellulose (REFRESH PLUS) 0.5 % SOLN, Place 2 drops into both eyes 2 (two) times daily as needed (dry eye)., Disp: , Rfl:    cetirizine (ZYRTEC) 10 MG tablet, Take 1 tablet (10 mg total) by mouth every morning., Disp: 30 tablet, Rfl: 2   esomeprazole (  NEXIUM) 20 MG capsule, Take 20 mg by mouth daily at 12 noon., Disp: , Rfl:    Fluticasone-Umeclidin-Vilant (TRELEGY ELLIPTA) 100-62.5-25 MCG/ACT AEPB, Take 1 puff by mouth daily., Disp: 1 each, Rfl: 2   folic acid (FOLVITE) 1 MG tablet, Take 1 tablet (1 mg total) by mouth daily., Disp: 30 tablet, Rfl: 1   gabapentin (NEURONTIN) 100 MG capsule, Take 2-3 capsules (200-300 mg total) by mouth 3 (three) times daily. 200 mg every AM and at noon and 300 mg QHS (Patient taking differently: Take 200-300 mg by mouth 3 (three) times daily. 200 mg every morning and at noon and 300 mg QHS), Disp: 90 capsule, Rfl: 1   guaiFENesin-dextromethorphan (ROBITUSSIN DM) 100-10 MG/5ML syrup, Take 5 mLs by mouth every 4 (four) hours as needed for cough., Disp: 118 mL, Rfl: 0   hydroxychloroquine (PLAQUENIL) 200 MG tablet, Take 200 mg by mouth daily., Disp: , Rfl:    LORazepam (ATIVAN) 0.5 MG tablet, Take 1 tablet (0.5 mg total) by mouth every 8 (eight) hours., Disp: 30 tablet, Rfl: 0   OXYGEN, Inhale 1-6 L into the lungs continuous., Disp: , Rfl:    Polyethyl Glycol-Propyl Glycol (SYSTANE) 0.4-0.3 % SOLN, Place 1 drop into both eyes daily as needed (dry eyes)., Disp: , Rfl:    polyethylene glycol (MIRALAX / GLYCOLAX) packet, Take 17 g by mouth daily. (Patient taking differently: Take 17 g by mouth daily as needed for moderate constipation.), Disp: 30 each, Rfl: 1   predniSONE (DELTASONE) 5 MG tablet, Take 5 mg by mouth daily with  breakfast., Disp: , Rfl:    sertraline (ZOLOFT) 100 MG tablet, Take 150 mg by mouth daily., Disp: , Rfl:   Current Facility-Administered Medications:    0.9 %  sodium chloride infusion, , Intravenous, Continuous, Naythan Douthit, Rudell Cobb, MD, Stopped at 12/14/21 1405  Facility-Administered Medications Ordered in Other Visits:    HYDROmorphone (DILAUDID) injection 0.5 mg, 0.5 mg, Intravenous, Once, Lemuel Boodram, Rudell Cobb, MD   ondansetron [facility]) injection 8 mg, 8 mg, Intravenous, Once, Alannis Hsia, Rudell Cobb, MD:  :   Allergies  Allergen Reactions   Kapidex [Dexlansoprazole] Diarrhea    Stomach pain,   Zegerid [Omeprazole] Other (See Comments)    Burning in chest  :   Family History  Problem Relation Age of Onset   Asthma Maternal Grandmother    Breast cancer Maternal Grandmother   :   Social History   Socioeconomic History   Marital status: Single    Spouse name: Not on file   Number of children: Not on file   Years of education: Not on file   Highest education level: Not on file  Occupational History   Not on file  Tobacco Use   Smoking status: Former    Packs/day: 3.00    Years: 60.00    Total pack years: 180.00    Types: Cigarettes    Quit date: 06/25/2011    Years since quitting: 10.4   Smokeless tobacco: Never  Vaping Use   Vaping Use: Never used  Substance and Sexual Activity   Alcohol use: No   Drug use: No   Sexual activity: Not on file  Other Topics Concern   Not on file  Social History Narrative   Not on file   Social Determinants of Health   Financial Resource Strain: Not on file  Food Insecurity: Not on file  Transportation Needs: Not on file  Physical Activity: Not on file  Stress: Not on file  Social  Connections: Not on file  Intimate Partner Violence: Not on file  :  Review of Systems  Constitutional:  Positive for malaise/fatigue and weight loss.  HENT: Negative.    Eyes: Negative.   Respiratory:  Positive for shortness of breath.    Cardiovascular: Negative.   Gastrointestinal:  Positive for abdominal pain, nausea and vomiting.  Genitourinary: Negative.   Musculoskeletal: Negative.   Skin: Negative.   Neurological:  Positive for weakness.  Endo/Heme/Allergies: Negative.   Psychiatric/Behavioral: Negative.       Exam: Her vital signs show temperature of 97.7.  Pulse 95.  Blood pressure 94/39.  Weight is 185 pounds.  '@IPVITALS'$ @ Physical Exam Vitals reviewed.  HENT:     Head: Normocephalic and atraumatic.  Eyes:     Pupils: Pupils are equal, round, and reactive to light.  Cardiovascular:     Rate and Rhythm: Normal rate and regular rhythm.     Heart sounds: Normal heart sounds.  Pulmonary:     Effort: Pulmonary effort is normal.     Breath sounds: Normal breath sounds.     Comments: Pulmonary exam shows some wheezing bilaterally.  She has decent air movement bilaterally. Abdominal:     General: Bowel sounds are normal.     Palpations: Abdomen is soft.     Comments: Her abdomen is soft.  Bowel sounds are decreased.  There is hepatomegaly.  Musculoskeletal:        General: No tenderness or deformity. Normal range of motion.     Cervical back: Normal range of motion.  Lymphadenopathy:     Cervical: No cervical adenopathy.  Skin:    General: Skin is warm and dry.     Findings: No erythema or rash.  Neurological:     Mental Status: She is alert and oriented to person, place, and time.  Psychiatric:        Behavior: Behavior normal.        Thought Content: Thought content normal.        Judgment: Judgment normal.     Recent Labs    12/14/21 1116  WBC 13.2*  HGB 12.1  HCT 39.2  PLT 397    Recent Labs    12/14/21 1116  NA 135  K 4.5  CL 95*  CO2 29  GLUCOSE 161*  BUN 28*  CREATININE 1.46*  CALCIUM 9.7    Blood smear review: None  Pathology: None    Assessment and Plan: Ms. Mctigue is a very nice 86 year old white female.  I think she has a really tough problem.  By the appearance  of the scans, I would have to say that this is probably to be a small cell lung cancer.  This certainly has the appearance of small cell lung cancer with involvement of the hilum and mediastinum.  She has a strong history of tobacco use.  I really think that she is not to be a candidate for any chemotherapy.  She just has a very poor performance status.  She is in a wheelchair.  She really has a hard time walking.  She is incredibly weak.  She is having vomiting, which I am sure is from the tumor is in the liver.  I really think that we need to consider her for Hospice.  I just do not believe that she would tolerate systemic chemotherapy.  I had a very long talk with she and her family.  I think that the overall in agreement that we need to consider her  comfort as the primary goal.  I think that she really needs to be in Hospice.  I think she probably needs to be in a Hospice home.  I just do not see that her prognosis is going to be more than 2 or 3 weeks.  Again, she is quite mature.  She does not have a lot of reserve.  She has an incredible amount of disease in her liver.  We will get have to give her some IV fluid in the office.  We will give her some IV antiemetics.  I just hate to see that she is miserable like this.  Again, she has a very unusual living situation.  She really needs to have some more stability.  Again, I really think that her best option is going to be a Hospice facility.  Her sister, lives in [place].  She cannot take care of Ms. Campillo because she is busy with her husband who has dementia and a sister-in-law who also has some special needs.  Her daughter lives in [place].  She has to go back to [place] to work.  Again, I really think that the prognosis for Ms. Pickrell is incredibly limited.  I just want her to have respect, comfort and, dignity.  I did discuss end-of-life issues.  Her daughter says that she will talk with Ms. Porath about this and get the DNR status  established.  I totally agree with this.  I just cannot imagine that Ms. Folker would wish to be kept alive given that she has underlying COPD, is oxygen dependent, and has widely metastatic malignancy.  I did give Ms. Kaneshiro a prayer blanket.  She is very grateful for this.

## 2021-12-14 NOTE — Progress Notes (Signed)
Pt discharged via w/c and assist of two RN's to pt.'s daughter, Crystal who will drive pt to [facility] for admission. Adelene Amas RN here from [facility] to assist with discharge process to [facility].

## 2021-12-14 NOTE — Patient Instructions (Signed)
Dehydration, Adult Dehydration is a condition in which there is not enough water or other fluids in the body. This happens when a person loses more fluids than he or she takes in. Important organs, such as the kidneys, brain, and heart, cannot function without a proper amount of fluids. Any loss of fluids from the body can lead to dehydration. Dehydration can be mild, moderate, or severe. It should be treated right away to prevent it from becoming severe. What are the causes? Dehydration may be caused by: Conditions that cause loss of water or other fluids, such as diarrhea, vomiting, or sweating or urinating a lot. Not drinking enough fluids, especially when you are ill or doing activities that require a lot of energy. Other illnesses and conditions, such as fever or infection. Certain medicines, such as medicines that remove excess fluid from the body (diuretics). Lack of safe drinking water. Not being able to get enough water and food. What increases the risk? The following factors may make you more likely to develop this condition: Having a long-term (chronic) illness that has not been treated properly, such as diabetes, heart disease, or kidney disease. Being 65 years of age or older. Having a disability. Living in a place that is high in altitude, where thinner, drier air causes more fluid loss. Doing exercises that put stress on your body for a long time (endurance sports). What are the signs or symptoms? Symptoms of dehydration depend on how severe it is. Mild or moderate dehydration Thirst. Dry lips or dry mouth. Dizziness or light-headedness, especially when standing up from a seated position. Muscle cramps. Dark urine. Urine may be the color of tea. Less urine or tears produced than usual. Headache. Severe dehydration Changes in skin. Your skin may be cold and clammy, blotchy, or pale. Your skin also may not return to normal after being lightly pinched and released. Little or  no tears, urine, or sweat. Changes in vital signs, such as rapid breathing and low blood pressure. Your pulse may be weak or may be faster than 100 beats a minute when you are sitting still. Other changes, such as: Feeling very thirsty. Sunken eyes. Cold hands and feet. Confusion. Being very tired (lethargic) or having trouble waking from sleep. Short-term weight loss. Loss of consciousness. How is this diagnosed? This condition is diagnosed based on your symptoms and a physical exam. You may have blood and urine tests to help confirm the diagnosis. How is this treated? Treatment for this condition depends on how severe it is. Treatment should be started right away. Do not wait until dehydration becomes severe. Severe dehydration is an emergency and needs to be treated in a hospital. Mild or moderate dehydration can be treated at home. You may be asked to: Drink more fluids. Drink an oral rehydration solution (ORS). This drink helps restore proper amounts of fluids and salts and minerals in the blood (electrolytes). Severe dehydration can be treated: With IV fluids. By correcting abnormal levels of electrolytes. This is often done by giving electrolytes through a tube that is passed through your nose and into your stomach (nasogastric tube, or NG tube). By treating the underlying cause of dehydration. Follow these instructions at home: Oral rehydration solution If told by your health care provider, drink an ORS: Make an ORS by following instructions on the package. Start by drinking small amounts, about  cup (120 mL) every 5-10 minutes. Slowly increase how much you drink until you have taken the amount recommended by your health   care provider. Eating and drinking        Drink enough clear fluid to keep your urine pale yellow. If you were told to drink an ORS, finish the ORS first and then start slowly drinking other clear fluids. Drink fluids such as: Water. Do not drink only  water. Doing that can lead to hyponatremia, which is having too little salt (sodium) in the body. Water from ice chips you suck on. Fruit juice that you have added water to (diluted fruit juice). Low-calorie sports drinks. Eat foods that contain a healthy balance of electrolytes, such as bananas, oranges, potatoes, tomatoes, and spinach. Do not drink alcohol. Avoid the following: Drinks that contain a lot of sugar. These include high-calorie sports drinks, fruit juice that is not diluted, and soda. Caffeine. Foods that are greasy or contain a lot of fat or sugar. General instructions Take over-the-counter and prescription medicines only as told by your health care provider. Do not take sodium tablets. Doing that can lead to having too much sodium in the body (hypernatremia). Return to your normal activities as told by your health care provider. Ask your health care provider what activities are safe for you. Keep all follow-up visits as told by your health care provider. This is important. Contact a health care provider if: You have muscle cramps, pain, or discomfort, such as: Pain in your abdomen and the pain gets worse or stays in one area (localizes). Stiff neck. You have a rash. You are more irritable than usual. You are sleepier or have a harder time waking than usual. You feel weak or dizzy. You feel very thirsty. Get help right away if you have: Any symptoms of severe dehydration. Symptoms of vomiting, such as: You cannot eat or drink without vomiting. Vomiting gets worse or does not go away. Vomit includes blood or green matter (bile). Symptoms that get worse with treatment. A fever. A severe headache. Problems with urination or bowel movements, such as: Diarrhea that gets worse or does not go away. Blood in your stool (feces). This may cause stool to look black and tarry. Not urinating, or urinating only a small amount of very dark urine, within 6-8 hours. Trouble  breathing. These symptoms may represent a serious problem that is an emergency. Do not wait to see if the symptoms will go away. Get medical help right away. Call your local emergency services (911 in the [place] Do not drive yourself to the hospital. Summary Dehydration is a condition in which there is not enough water or other fluids in the body. This happens when a person loses more fluids than he or she takes in. Treatment for this condition depends on how severe it is. Treatment should be started right away. Do not wait until dehydration becomes severe. Drink enough clear fluid to keep your urine pale yellow. If you were told to drink an oral rehydration solution (ORS), finish the ORS first and then start slowly drinking other clear fluids. Take over-the-counter and prescription medicines only as told by your health care provider. Get help right away if you have any symptoms of severe dehydration. This information is not intended to replace advice given to you by your health care provider. Make sure you discuss any questions you have with your health care provider. Document Revised: 08/16/2021 Document Reviewed: 11/20/2018 Elsevier Patient Education  2023 [employer] Inc.  

## 2021-12-15 LAB — CA 125: Cancer Antigen (CA) 125: 72.7 U/mL — ABNORMAL HIGH (ref 0.0–38.1)

## 2021-12-15 LAB — AFP TUMOR MARKER: AFP, Serum, Tumor Marker: 7.1 ng/mL (ref 0.0–8.7)

## 2021-12-15 LAB — CANCER ANTIGEN 27.29: CA 27.29: 188.3 U/mL — ABNORMAL HIGH (ref 0.0–38.6)

## 2021-12-15 LAB — CANCER ANTIGEN 19-9: CA 19-9: 1206 U/mL — ABNORMAL HIGH (ref 0–35)

## 2021-12-26 ENCOUNTER — Telehealth: Payer: Self-pay

## 2021-12-26 NOTE — Telephone Encounter (Signed)
Fax received from [facility] patient passed January 01, 2022.

## 2022-01-08 DIAGNOSIS — J449 Chronic obstructive pulmonary disease, unspecified: Secondary | ICD-10-CM | POA: Diagnosis not present

## 2022-01-10 DIAGNOSIS — J449 Chronic obstructive pulmonary disease, unspecified: Secondary | ICD-10-CM | POA: Diagnosis not present

## 2022-01-21 DEATH — deceased

## 2023-08-21 IMAGING — CT CT CHEST W/O CM
2 of 3 series · 15 of 36 positions shown, 18 images · non-contrast
Comparison: Previous studies including chest radiograph done
earlier today and CT done on 05/16/2019

CLINICAL DATA: Pneumonia, cough



[Series 2: thorax · axial · 0.68mm/px · z∈[+1186,+1434]mm · 12 of 146 slices shown, 15 images]
[im 11/146  mediastinal]
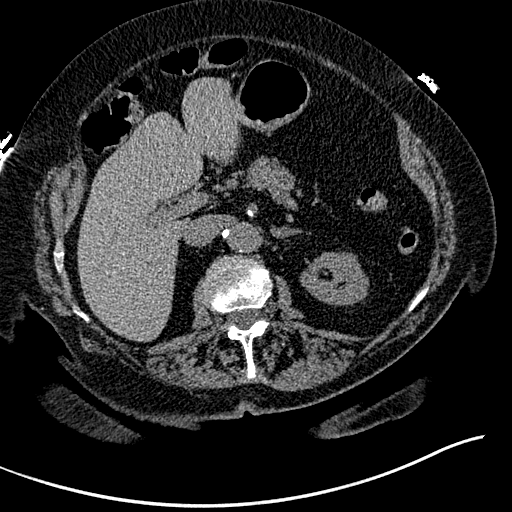
[im 11/146  lung]
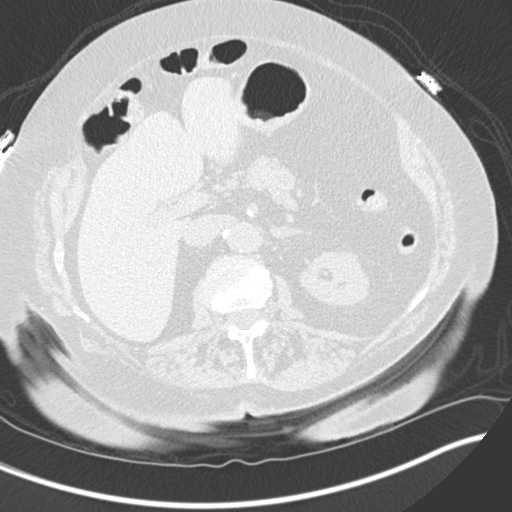
[im 22/146  lung]
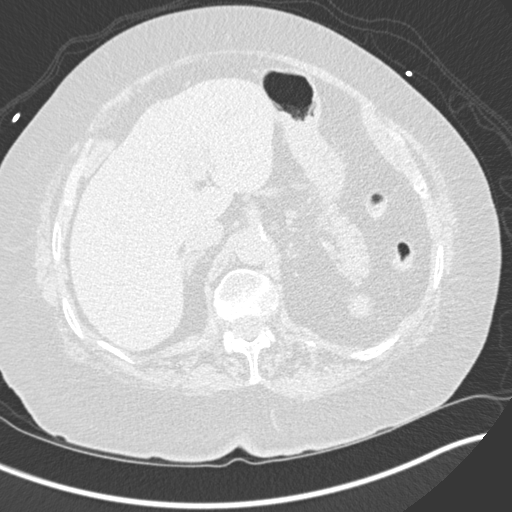
[im 33/146  lung]
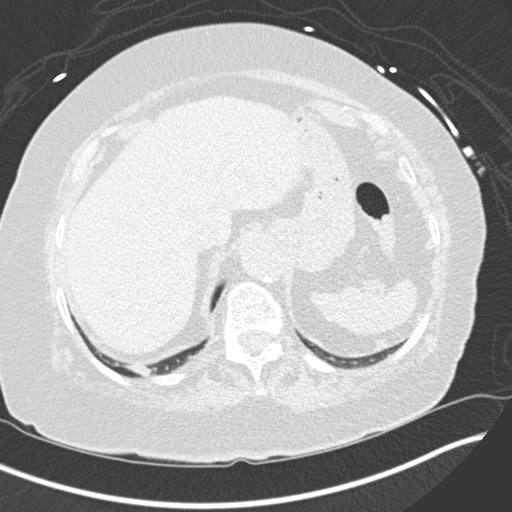
[im 43/146  lung]
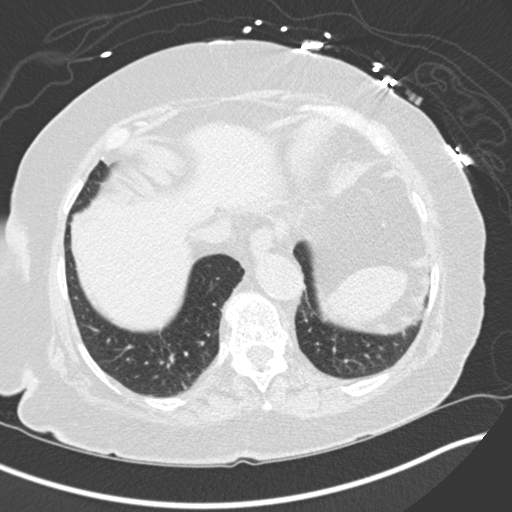
[im 54/146  mediastinal]
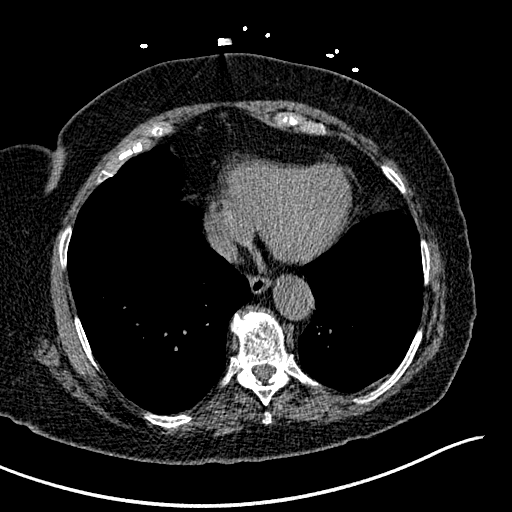
[im 54/146  lung]
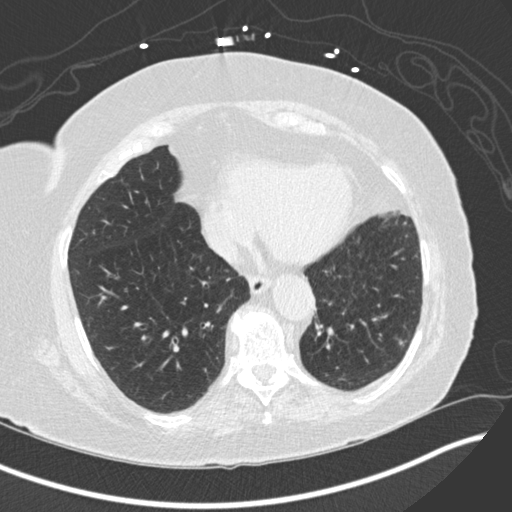
[im 65/146  lung]
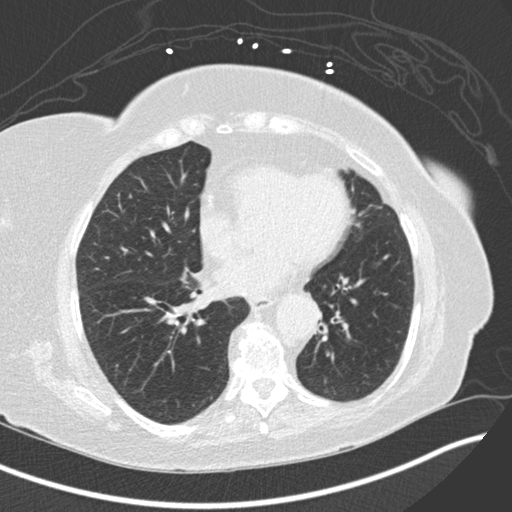
[im 81/146  lung]
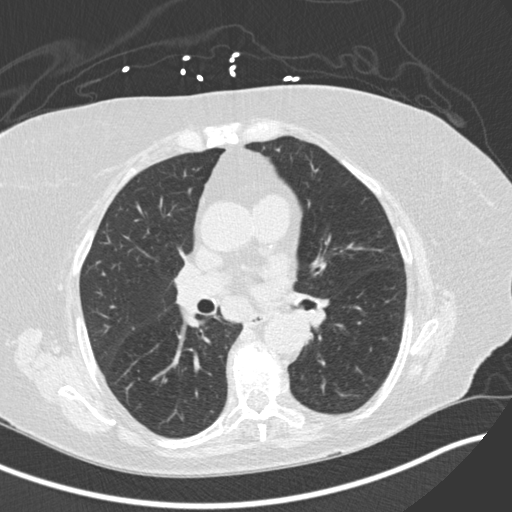
[im 92/146  lung]
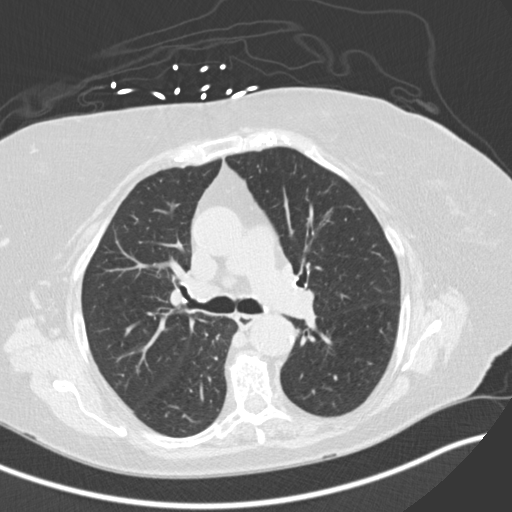
[im 103/146  mediastinal]
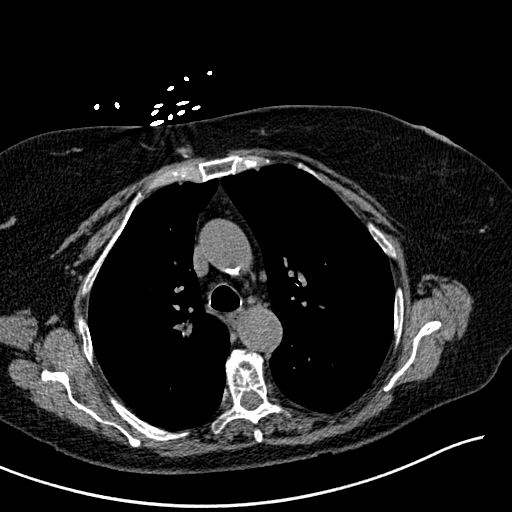
[im 103/146  lung]
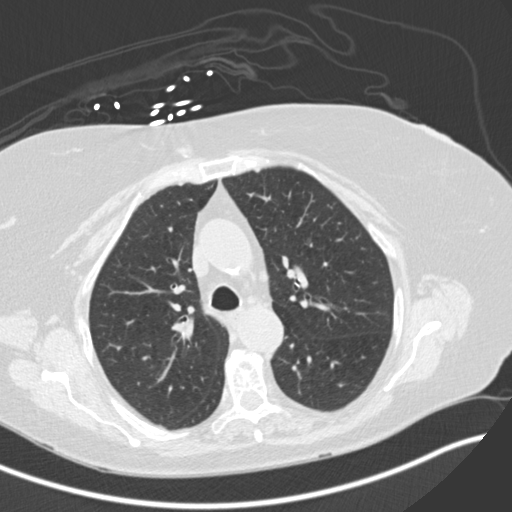
[im 113/146  lung]
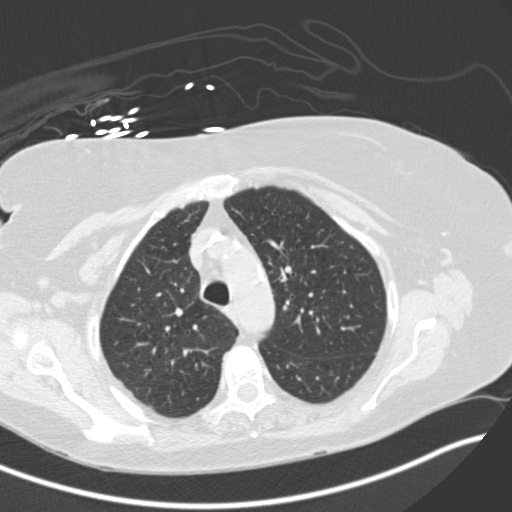
[im 124/146  lung]
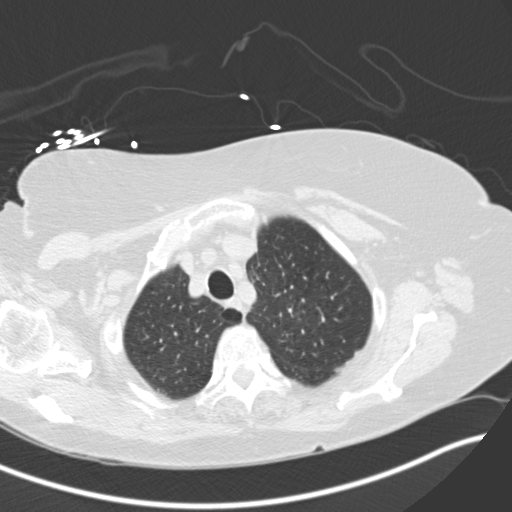
[im 135/146  lung]
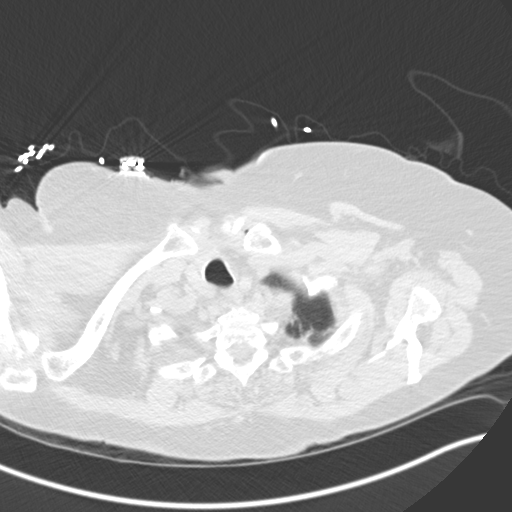

[Series 6: coronal · coronal · 0.59mm/px · 3 of 136 slices shown]
[im 28/136  lung]
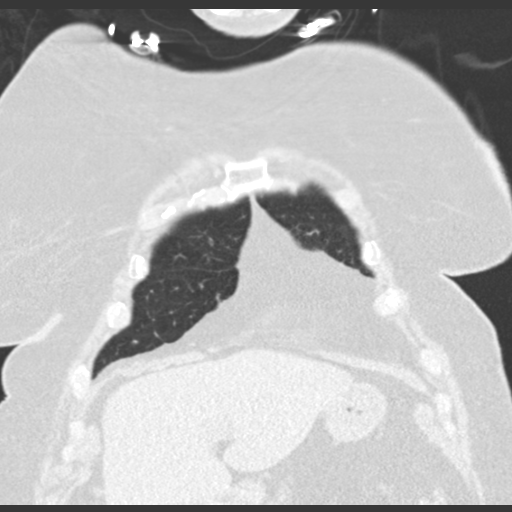
[im 55/136  lung]
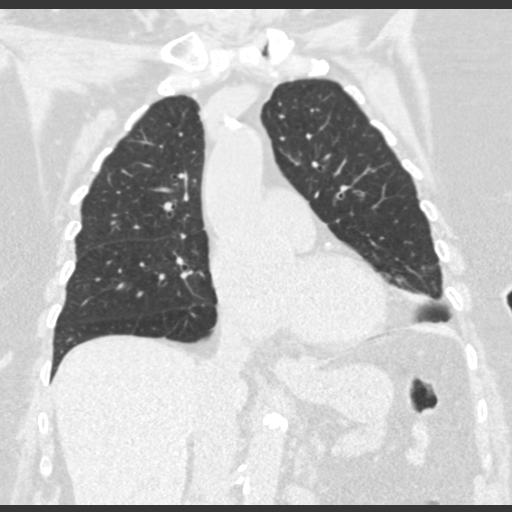
[im 82/136  lung]
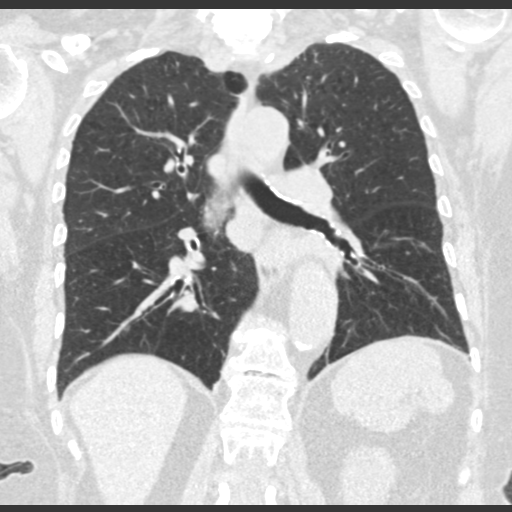

[15 of 36 positions shown; findings below may reference images not displayed]

FINDINGS: Cardiovascular: There are scattered coronary artery calcifications.
There are scattered calcifications in the thoracic aorta. Ascending
thoracic aorta measures 3.7 cm in diameter.

Mediastinum/Nodes: There are few slightly enlarged lymph nodes in
the mediastinum largest in the subcarinal region measuring 15 mm in
short axis. There is increase in size of mediastinal lymph nodes.

Lungs/Pleura: There is no focal pulmonary consolidation. There are
linear densities in both apices suggesting scarring. Small linear
density in the posterior right lower lung fields may suggest
scarring. Small linear densities seen in the lingula. No new
discrete lung nodules are seen. There is no pleural effusion or
pneumothorax.

Upper Abdomen: Surgical clips are seen in gallbladder fossa.

Musculoskeletal: Degenerative changes are noted with encroachment of
neural foramina in the lower cervical spine at C6-C7 and C7-T1
levels.
IMPRESSION: There is no focal pulmonary consolidation. There is no pleural
effusion or pneumothorax. Small linear densities in both apices and
lower lung fields may suggest scarring.

There are slightly enlarged lymph nodes in the mediastinum largest
measuring 1.5 cm in short axis in the subcarinal region. There is
interval increase in size of mediastinal lymph nodes. Findings may
suggest reactive hyperplasia or active inflammatory or neoplastic
process. Short-term follow-up CT in 3 months may be considered.

Other findings as described in the body of the report.
